# Patient Record
Sex: Male | Born: 1945 | Race: White | Hispanic: No | Marital: Married | Smoking: Former smoker
Health system: Southern US, Community
[De-identification: ages and names within clinical notes are randomized; demographics above are authoritative.]

## PROBLEM LIST (undated history)

## (undated) DIAGNOSIS — F32A Depression, unspecified: Secondary | ICD-10-CM

## (undated) DIAGNOSIS — I10 Essential (primary) hypertension: Secondary | ICD-10-CM

## (undated) DIAGNOSIS — Z5111 Encounter for antineoplastic chemotherapy: Secondary | ICD-10-CM

## (undated) DIAGNOSIS — R51 Headache: Secondary | ICD-10-CM

## (undated) DIAGNOSIS — F329 Major depressive disorder, single episode, unspecified: Secondary | ICD-10-CM

## (undated) DIAGNOSIS — C642 Malignant neoplasm of left kidney, except renal pelvis: Secondary | ICD-10-CM

## (undated) DIAGNOSIS — I2510 Atherosclerotic heart disease of native coronary artery without angina pectoris: Secondary | ICD-10-CM

## (undated) DIAGNOSIS — Z923 Personal history of irradiation: Secondary | ICD-10-CM

## (undated) DIAGNOSIS — E213 Hyperparathyroidism, unspecified: Secondary | ICD-10-CM

## (undated) DIAGNOSIS — I714 Abdominal aortic aneurysm, without rupture, unspecified: Secondary | ICD-10-CM

## (undated) DIAGNOSIS — L309 Dermatitis, unspecified: Secondary | ICD-10-CM

## (undated) DIAGNOSIS — E119 Type 2 diabetes mellitus without complications: Secondary | ICD-10-CM

## (undated) DIAGNOSIS — J45909 Unspecified asthma, uncomplicated: Secondary | ICD-10-CM

## (undated) DIAGNOSIS — E860 Dehydration: Secondary | ICD-10-CM

## (undated) DIAGNOSIS — G4730 Sleep apnea, unspecified: Secondary | ICD-10-CM

## (undated) DIAGNOSIS — K921 Melena: Principal | ICD-10-CM

## (undated) DIAGNOSIS — E785 Hyperlipidemia, unspecified: Secondary | ICD-10-CM

## (undated) DIAGNOSIS — IMO0001 Reserved for inherently not codable concepts without codable children: Secondary | ICD-10-CM

## (undated) DIAGNOSIS — F419 Anxiety disorder, unspecified: Secondary | ICD-10-CM

## (undated) DIAGNOSIS — J449 Chronic obstructive pulmonary disease, unspecified: Secondary | ICD-10-CM

## (undated) DIAGNOSIS — R519 Headache, unspecified: Secondary | ICD-10-CM

## (undated) DIAGNOSIS — E039 Hypothyroidism, unspecified: Secondary | ICD-10-CM

## (undated) DIAGNOSIS — N189 Chronic kidney disease, unspecified: Secondary | ICD-10-CM

## (undated) DIAGNOSIS — K219 Gastro-esophageal reflux disease without esophagitis: Secondary | ICD-10-CM

## (undated) DIAGNOSIS — G629 Polyneuropathy, unspecified: Secondary | ICD-10-CM

## (undated) DIAGNOSIS — R011 Cardiac murmur, unspecified: Secondary | ICD-10-CM

## (undated) DIAGNOSIS — I712 Thoracic aortic aneurysm, without rupture: Secondary | ICD-10-CM

## (undated) DIAGNOSIS — C801 Malignant (primary) neoplasm, unspecified: Secondary | ICD-10-CM

## (undated) DIAGNOSIS — Z5112 Encounter for antineoplastic immunotherapy: Secondary | ICD-10-CM

## (undated) HISTORY — DX: Melena: K92.1

## (undated) HISTORY — DX: Anxiety disorder, unspecified: F41.9

## (undated) HISTORY — PX: CERVICAL FUSION: SHX112

## (undated) HISTORY — PX: KNEE SURGERY: SHX244

## (undated) HISTORY — PX: CARDIAC CATHETERIZATION: SHX172

## (undated) HISTORY — DX: Encounter for antineoplastic chemotherapy: Z51.11

## (undated) HISTORY — DX: Unspecified asthma, uncomplicated: J45.909

## (undated) HISTORY — PX: BACK SURGERY: SHX140

## (undated) HISTORY — DX: Malignant neoplasm of left kidney, except renal pelvis: C64.2

## (undated) HISTORY — DX: Dehydration: E86.0

## (undated) HISTORY — DX: Encounter for antineoplastic immunotherapy: Z51.12

## (undated) HISTORY — PX: FRACTURE SURGERY: SHX138

## (undated) HISTORY — DX: Essential (primary) hypertension: I10

## (undated) HISTORY — PX: CORONARY ARTERY BYPASS GRAFT: SHX141

## (undated) HISTORY — DX: Hyperlipidemia, unspecified: E78.5

## (undated) HISTORY — DX: Type 2 diabetes mellitus without complications: E11.9

---

## 1998-07-30 ENCOUNTER — Observation Stay (HOSPITAL_COMMUNITY): Admission: RE | Admit: 1998-07-30 | Discharge: 1998-07-31 | Payer: Self-pay | Admitting: Neurosurgery

## 2001-10-30 ENCOUNTER — Encounter: Payer: Self-pay | Admitting: Cardiology

## 2001-10-30 ENCOUNTER — Encounter: Admission: RE | Admit: 2001-10-30 | Discharge: 2001-10-30 | Payer: Self-pay | Admitting: Cardiology

## 2001-11-06 ENCOUNTER — Ambulatory Visit (HOSPITAL_COMMUNITY): Admission: RE | Admit: 2001-11-06 | Discharge: 2001-11-06 | Payer: Self-pay | Admitting: Cardiology

## 2001-11-13 ENCOUNTER — Encounter: Payer: Self-pay | Admitting: Cardiology

## 2001-11-13 ENCOUNTER — Ambulatory Visit (HOSPITAL_COMMUNITY): Admission: RE | Admit: 2001-11-13 | Discharge: 2001-11-13 | Payer: Self-pay | Admitting: Cardiology

## 2001-11-27 ENCOUNTER — Encounter: Payer: Self-pay | Admitting: Cardiothoracic Surgery

## 2001-11-29 ENCOUNTER — Inpatient Hospital Stay (HOSPITAL_COMMUNITY): Admission: RE | Admit: 2001-11-29 | Discharge: 2001-12-04 | Payer: Self-pay | Admitting: Cardiothoracic Surgery

## 2001-11-29 ENCOUNTER — Encounter: Payer: Self-pay | Admitting: Cardiothoracic Surgery

## 2001-11-30 ENCOUNTER — Encounter: Payer: Self-pay | Admitting: Cardiothoracic Surgery

## 2001-12-01 ENCOUNTER — Encounter: Payer: Self-pay | Admitting: Cardiothoracic Surgery

## 2001-12-02 ENCOUNTER — Encounter: Payer: Self-pay | Admitting: Cardiothoracic Surgery

## 2001-12-08 ENCOUNTER — Encounter: Admission: RE | Admit: 2001-12-08 | Discharge: 2001-12-08 | Payer: Self-pay | Admitting: Cardiothoracic Surgery

## 2001-12-08 ENCOUNTER — Encounter: Payer: Self-pay | Admitting: Cardiothoracic Surgery

## 2002-01-08 ENCOUNTER — Encounter (HOSPITAL_COMMUNITY): Admission: RE | Admit: 2002-01-08 | Discharge: 2002-04-08 | Payer: Self-pay | Admitting: Cardiology

## 2002-07-11 ENCOUNTER — Encounter: Payer: Self-pay | Admitting: Cardiology

## 2002-07-11 ENCOUNTER — Encounter: Admission: RE | Admit: 2002-07-11 | Discharge: 2002-07-11 | Payer: Self-pay | Admitting: Cardiology

## 2002-07-16 ENCOUNTER — Ambulatory Visit (HOSPITAL_COMMUNITY): Admission: RE | Admit: 2002-07-16 | Discharge: 2002-07-16 | Payer: Self-pay | Admitting: Cardiology

## 2002-09-26 ENCOUNTER — Ambulatory Visit (HOSPITAL_COMMUNITY): Admission: RE | Admit: 2002-09-26 | Discharge: 2002-09-26 | Payer: Self-pay | Admitting: *Deleted

## 2006-05-05 ENCOUNTER — Other Ambulatory Visit: Payer: Self-pay

## 2006-05-05 ENCOUNTER — Emergency Department: Payer: Self-pay | Admitting: Internal Medicine

## 2007-04-11 ENCOUNTER — Other Ambulatory Visit: Payer: Self-pay

## 2007-04-11 ENCOUNTER — Emergency Department: Payer: Self-pay | Admitting: Emergency Medicine

## 2008-02-07 ENCOUNTER — Encounter: Admission: RE | Admit: 2008-02-07 | Discharge: 2008-02-07 | Payer: Self-pay | Admitting: Cardiology

## 2008-02-15 ENCOUNTER — Ambulatory Visit (HOSPITAL_COMMUNITY): Admission: RE | Admit: 2008-02-15 | Discharge: 2008-02-15 | Payer: Self-pay | Admitting: Cardiology

## 2009-05-08 ENCOUNTER — Emergency Department (HOSPITAL_COMMUNITY): Admission: EM | Admit: 2009-05-08 | Discharge: 2009-05-09 | Payer: Self-pay | Admitting: Emergency Medicine

## 2009-09-08 ENCOUNTER — Ambulatory Visit: Payer: Self-pay

## 2009-12-17 HISTORY — PX: PARATHYROIDECTOMY: SHX19

## 2010-07-12 LAB — DIFFERENTIAL
Basophils Absolute: 0.1 10*3/uL (ref 0.0–0.1)
Eosinophils Absolute: 0.2 10*3/uL (ref 0.0–0.7)
Lymphocytes Relative: 10 % — ABNORMAL LOW (ref 12–46)
Lymphs Abs: 1.4 10*3/uL (ref 0.7–4.0)
Neutrophils Relative %: 83 % — ABNORMAL HIGH (ref 43–77)

## 2010-07-12 LAB — POCT I-STAT, CHEM 8
BUN: 19 mg/dL (ref 6–23)
Creatinine, Ser: 0.8 mg/dL (ref 0.4–1.5)
Hemoglobin: 13.3 g/dL (ref 13.0–17.0)
Potassium: 4.8 mEq/L (ref 3.5–5.1)
Sodium: 133 mEq/L — ABNORMAL LOW (ref 135–145)
TCO2: 29 mmol/L (ref 0–100)

## 2010-07-12 LAB — CBC
Platelets: 247 10*3/uL (ref 150–400)
RDW: 14.1 % (ref 11.5–15.5)
WBC: 14.1 10*3/uL — ABNORMAL HIGH (ref 4.0–10.5)

## 2010-07-12 LAB — URINALYSIS, ROUTINE W REFLEX MICROSCOPIC
Bilirubin Urine: NEGATIVE
Glucose, UA: >1000 mg/dL — AB
Hgb urine dipstick: NEGATIVE
Ketones, ur: NEGATIVE mg/dL
Leukocytes, UA: NEGATIVE
Nitrite: NEGATIVE
Protein, ur: NEGATIVE mg/dL
Specific Gravity, Urine: 1.024 (ref 1.005–1.030)
Urobilinogen, UA: 1 mg/dL (ref 0.0–1.0)
pH: 7 (ref 5.0–8.0)

## 2010-07-12 LAB — URINE MICROSCOPIC-ADD ON

## 2010-09-08 NOTE — Cardiovascular Report (Signed)
NAME:  [name], [name] NO.:  [id number]   MEDICAL RECORD NO.:  [id number]          PATIENT TYPE:  OIB   LOCATION:  2899                         FACILITY:  [facility]   PHYSICIAN:  Thereasa Solo. Little, M.D. DATE OF BIRTH:  02/09/1946   DATE OF PROCEDURE:  DATE OF DISCHARGE:  02/15/2008                            CARDIAC CATHETERIZATION   INDICATIONS FOR TEST:  This 65 year old male had bypass surgery in 2003.  He had presented complaining of exertional shortness of breath  associated with chest pressure.  A nuclear study was performed that  showed a 52% ejection fraction and inferior ischemia.  Because of that,  he was brought to the cath lab for cardiac catheterization.   PROCEDURE:  After obtaining informed consent, the patient was prepped  and draped in the usual sterile fashion exposing the right groin  following local anesthetic with 1% Xylocaine.  The Seldinger technique  was employed and a 5-French introducer sheath was placed in the right  femoral artery.  Left and right coronary arteriography and  ventriculography in the RAO projection and a distal aortogram and graft  visualization x3 was performed.   COMPLICATIONS:  None.   EQUIPMENT:  5-French Judkins configuration catheters.   The right coronary catheter was used to selectively engage all grafts.   TOTAL CONTRAST:  125 mL.   RESULTS:  1. Hemodynamic monitoring central aortic pressure was 159/83 and left      ventricular pressure was 167/10.  There was no significant gradient      at the time of pullback.  2. Ventriculography.  Ventriculography in the RAO projection using 20      mL of contrasted 12 mL per second showed an ejection fraction of      50%.  The posterior basilar segment was akinetic.  The left      ventricular end-diastolic pressure was 22.   Distal aortogram:  Distal aortogram done at the level of the renal  artery showed no evidence of renal artery stenosis and no abdominal  aortic  aneurysm.   Coronary arteriography:  On fluoroscopy there was calcification of the  left main and LAD.   1. Left main appeared to be normal.  2. Circumflex.  The ongoing circumflex was small after the first OM      came off.  The first OM was visualized and was free of disease.  3. LAD.  The proximal portion of the LAD had 80% narrowing just distal      to the left main.  The LAD itself showed bidirectional flow in its      proximal portion as did the first diagonal.  4. Right coronary artery right coronary was 100% occluded by cath in      2004.  Grafts:  Internal mammary artery to the LAD.  Internal mammary artery  was a large vessel inserted into the midportion of the LAD.  There was  excellent visualization of the LAD, which was free of disease.  There  was a very large collateral bed that seemed to attached at the end of  the LAD  and connected with the end of the PDA.  There was good  visualization of the PDA and the posterior lateral vessels and even the  distal RCA.  This entire system was well visualized, had no severe  stenoses.  1. Saphenous vein graft to the PDA, 100% occluded as ostium.  2. Saphenous vein graft to the diagonal.  The graft was widely patent.      The diagonal was small and free of disease and there was actually a      mismatch between the size of the graft and the size of the      diagonals.   With compare to his previous cath, there has been no substantial change.  His graft patency remains good.  He has an excellent collateral bed to  the distal right coronary artery and I cannot see any significant  anatomy that would correlate with the positive nuclear study or his  symptoms of exertional pressure.           ______________________________  Thereasa Solo. Little, M.D.     ABL/MEDQ  D:  02/15/2008  T:  02/16/2008  Job:  [id number]   cc:   Dr Franne Forts  Cath Lab

## 2010-09-11 NOTE — Cardiovascular Report (Signed)
[facility]  Patient:    [name], [name] Visit Number: [id number] MRN: [id number]          Service Type: CAT Location: [facility] 2852 [id number] Attending Physician:  Loreli Dollar Dictated by:   Julieanne Manson, M.D. Proc. Date: 11/06/01 Admit Date:  11/06/2001 Discharge Date: 11/06/2001   CC:         Franne Forts, M.D.  Cardiac Catheterization Laboratory   Cardiac Catheterization  INDICATIONS FOR PROCEDURE: The patient is a 65 year old male who has had classic anginal symptoms of about two weeks duration. He is brought in for outpatient cardiac catheterization and he has a normal ECG.  DESCRIPTION OF PROCEDURE: The patient was prepped and draped in the usual sterile fashion exposing the right groin.  Following local anesthetic with 1% Xylocaine, the Seldinger technique was employed and a 5 [place] introducer sheath was placed in the right femoral artery.  Selective left and right coronary arteriography and ventriculography in the RAO and LAO projection was performed.  COMPLICATIONS: None.  EQUIPMENT: A 5 French Judkins configuration catheters but a 5 cm left catheter had to be used to engage the left main.  RESULTS: 1. Hemodynamic monitoring: Central aortic pressure 145/81, left    ventricular pressure 146/13 with no significant valve gradient noted    at the time of pullback. 2. Ventriculography: Ventriculography in the both the RAO and LAO    projection revealed posterior basilar segment to be akinetic. The    inferior and lateral wall in particular showed normal contractility,    ejection fraction approximately 50%, end-diastolic pressure 15.  CORONARY ARTERIOGRAPHY: There was calcification seen on fluoroscopy in the distribution of the proximal LAD and proximal RCA. There was also minimal calcification in the coronary ostium. 1. Left main: Minimal irregularities. 2. LAD: The LAD had proximal and mid irregularities of 40-50% with the  distal vessel being free of disease and the first diagonal having only    minimal irregularities. There was a very large left to right collateral    bed. 3. Circumflex: The circumflex was a small vessel in the AV groove but    gave rise to one relatively good size OM. There was minimal irregularities    in the OM. 4. Right coronary artery: The right coronary artery was the larger of all    vessels. It was totally occluded in its midportion. There was large    left to right collaterals filling the PDA and two large posterolateral    branches.  CONCLUSIONS: 1. Total occlusion of the right coronary artery with large left to    right collaterals and mild to moderate disease in the left anterior    descending system. 2. Slightly diminished ejection fraction at 50%.  At this point, the patient needs an nuclear study. If there is minimal ischemia, particularly in the inferior wall, I would recommend medical therapy only. If however, there is moderate ischemia in the right coronary artery, percutaneous intervention to the totally occluded right would be an option. Clearly, if there is ischemia in the anterior wall, then this patient would need revascularization with bypass surgery. Dictated by:   Julieanne Manson, M.D. Attending Physician:  Loreli Dollar DD:  11/06/01 TD:  11/07/01 Job: [id number]

## 2010-09-11 NOTE — Discharge Summary (Signed)
NAME:  [name], [name] NO.:  [id number]   MEDICAL RECORD NO.:  [id number]                   PATIENT TYPE:  INP   LOCATION:  2032                                 FACILITY:  [facility]   PHYSICIAN:  Kerin Perna, M.D.               DATE OF BIRTH:  20-May-1945   DATE OF ADMISSION:  11/29/2001  DATE OF DISCHARGE:  12/04/2001                                 DISCHARGE SUMMARY   ADMISSION DIAGNOSES:  Coronary artery disease.   SECONDARY DIAGNOSES:  1. Type 2 diabetes mellitus.  2. Postoperative anemia secondary to blood loss.  3. Hypertension.   DISCHARGE DIAGNOSES:  Coronary artery disease.   HOSPITAL COURSE:  The patient was admitted to [facility] on November 29, 2001, after being seen and evaluated by Dr. Donata Clay as an outpatient  for coronary artery disease.  On the day of admission the patient underwent  a coronary artery bypass graft x4 with the left internal mammary artery  anastomosed to the left anterior descending artery with saphenous vein graft  to the diagonal artery and a sequential saphenous vein graft to the  posterior descending and posterolateral arteries.  No complications were  noted during the procedure.  Postoperatively the patient had a relatively  uneventful hospital course.  He had postoperative anemia secondary to blood  loss but this was tolerated well.  He was started on empiric Tequin for  upper respiratory congestion.  His diabetes mellitus was adequately  controlled with Lantus insulin and Glucophage.  He was subsequently deemed  stable for discharge home on postoperative day #4, December 03, 2001.   DISCHARGE MEDICATIONS:  1. Ultram 50 mg 1-2 tablets every 4 to 6 hours as needed for pain.  2. Aspirin 325 mg one daily.  3. Guaifenesin 600 mg two tablets twice daily for seven days.  4. Tequin 400 mg one daily for seven days.  5. Lopressor 50 mg one-half tablet every 12 hours.  6. Glucophage 1000 mg one tablet twice  daily.   DISCHARGE ACTIVITIES:  The patient was told no driving, strenuous activity  or lifting heavy objects.  He was told to walk daily and continue breathing  exercises.   DISCHARGE DIET:  1800 calorie, ADA diet.   WOUND CARE:  The patient was told he could shower and clean his incisions  with soap and water.   DISPOSITION:  To home.    FOLLOWUP APPOINTMENTS:  The patient was told to call his cardiologist, Dr.  Clarene Duke, for a two-week followup appointment.  He was told to see Dr. Donata Clay in three weeks.  The office will call and verify the time and date of  his appointment.  He was told to go to the [facility] one  hour before this appointment for chest x-ray.     Levin Erp. Steward, P.A.  Kerin Perna, M.D.    BGS/MEDQ  D:  12/02/2001  T:  12/06/2001  Job:  [id number]   cc:   Thereasa Solo. Little, M.D.

## 2010-09-11 NOTE — Cardiovascular Report (Signed)
NAME:  [name], [name] NO.:  [id number]   MEDICAL RECORD NO.:  [id number]                   PATIENT TYPE:  OIB   LOCATION:  2859                                 FACILITY:  [facility]   PHYSICIAN:  Thereasa Solo. Little, M.D.              DATE OF BIRTH:  [birth year]   DATE OF PROCEDURE:  07/16/2002  DATE OF DISCHARGE:                              CARDIAC CATHETERIZATION   INDICATIONS FOR PROCEDURE:  The patient is a 65 year old male who had bypass  surgery in August 2003.  He presented to my office on Thursday with 2 days  of exertional-type chest pain that was somewhat atypical but comparable to  what he had prior to his bypass surgery.  Because of his diabetes, his  Glucophage was discontinued.  He was brought in for outpatient cardiac  catheterization.  Over the weekend after stopping the Glucophage, he walked  3.5 miles without any symptoms.   DESCRIPTION OF PROCEDURE:  The patient was prepped and draped in the usual  sterile fashion exposing the right groin.  Applying local anesthetic of 1%  Xylocaine, the Seldinger technique was employed, and a 5-French introducer  sheath was placed into the right femoral artery.  Left and right coronary  arteriography, graft visualization, ventriculography, and aortic root  injection were performed.   EQUIPMENT:  Judkins 5-French configuration catheters, and the internal  mammary artery was cannulated with a right coronary catheter.   COMPLICATIONS:  None.   TOTAL CONTRAST:  150 cc.   RESULTS:  1. Hemodynamic monitoring     A. Central aortic pressure was 132/78.     B. Left ventricular pressure was 132/2 with no aortic valve gradient        noted at the time of pullback.  2. Ventriculography:  Ventriculography in the RAO projection revealed normal     LV systolic function.  Ejection fraction was approximately 50%, and the     end diastolic pressure was 15.  3. 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  No  evidence of an aneurysm of     dissection plane was noted.  4. Coronary arteriography     A. Left main:  Normal.     B. LAD:  The LAD was 100% occluded proximally after a first diagonal and        septal perforator.  The distal LAD was well visualized via the        internal mammary graft.     C. Circumflex:  Nondominant vessel.     D. Right coronary artery:  100% occluded proximally.  There was a very        large left-to-right collateral bed off the LAD.  This supplied the        entire PDA, PL, and ongoing RCA.     E. Grafts  5. Saphenous vein graft to the diagonal:  The graft itself was  widely     patent.  The diagonal was extremely small, less than about 1 to 1.5 mm in     diameter, and less than 30 mm in length.  The proximal portion at the     distal anastomotic site was about 70% narrowed.  This vessel was too     small to safely intervene with PCI.  6. Saphenous vein graft to the RCA:  The graft was 100% occluded proximally.     There was no hangup of contrast to suggest acute thrombus.  7. Internal mammary artery to the LAD:  The IMA was widely patent.  The LAD     was well visualized and was free of significant narrowing with a very     large collateral bed.  As the LAD crossed the apex of the heart, it     filled the entire PDA and posterolateral.   CONCLUSION:  1. Loss of the saphenous vein graft to the right coronary artery; however,     the right coronary artery is well protected via collaterals from the left     anterior descending artery.  2. Distal anastomotic narrowing in the saphenous vein graft to a very small     insignificant diagonal that is felt to be too small for percutaneous     intervention.  3. Ejection fraction 50%.  4. Dilatation of the aortic root.   A 12-lead EKG was performed following the procedure which was normal.  I  cannot be certain that his complaints of chest pain were, in fact, angina  with a normal EKG and his ability to walk 3 miles 2  days prior to this  cardiac catheterization.  I do not feel that attempts at intervening on the  occluded graft to the right coronary artery was prudent.  He will be managed  with medical therapy alone and should do quite well.                                               Thereasa Solo. Little, M.D.    ABL/MEDQ  D:  07/16/2002  T:  07/16/2002  Job:  [id number]   cc:   Julieanne Manson  [address]  [place]  [place] [address]  Fax: [phone]   CVTS

## 2010-09-11 NOTE — Op Note (Signed)
NAME:  Patrick, Jones NO.:  [id number]   MEDICAL RECORD NO.:  [id number]                   PATIENT TYPE:  INP   LOCATION:  2032                                 FACILITY:  [facility]   PHYSICIAN:  [name], M.D.           DATE OF BIRTH:  12-Aug-1945   DATE OF PROCEDURE:  DATE OF DISCHARGE:                                 OPERATIVE REPORT   OPERATION:  Coronary artery bypass grafting times four (left internal  mammary artery to LAD; saphenous vein graft to diagonal; sequential  saphenous vein graft to posterior descending and posterolateral branch of  the right coronary).   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   SURGEON:  [name], M.D.   ASSISTANT:  1. [name], M.D.  2. [name], P.A.   ANESTHESIA:  General by Dr. Darlyn Jones.   INDICATIONS FOR PROCEDURE:  The patient is a 65 year old type II diabetic  smoker, who presents with recurrent chest pain. Cardiac cath by Dr. Caprice Jones demonstrated occlusion of the right coronary with subtotal stenosis  of the LAD diagonal. He was treated medically without relief of symptoms and  he was felt to be a candidate for surgical coronary revascularization. Prior  to operation, I examined the patient in the office and reviewed the results  of the cardiac cath with the patient and his wife. I discussed the  indications and expected benefits of coronary bypass surgery for treatment  of his coronary artery disease and the expected outcome of those alternative  therapies. I reviewed the major aspects of the proposed operation with the  patient including the location of the surgical incisions, the use of general  anesthesia and cardiopulmonary artery bypass, the expected postop hospital  recovery, and the use of postop  support. I discussed with the patient, the  risks to him of coronary bypass surgery including the risks of MI, CVA,  bleeding, infection, blood transfusion requirement and death. He understood  with his heavy smoking history, he would be at risk for pulmonary  complications that could include difficulty with airway secretions,  prolonged mechanical ventilator requirements, pneumonia and an extended  hospital stay. After discussing these aspects of the procedure and the  benefits and risks, he agreed to proceed with the operation as planned under  what I felt was an informed consent.   OPERATIVE FINDINGS:  Saphenous vein was harvested from the right thigh using  endoscopic vein technique and extended to the lower leg with the open  technique. The vein was of average quality. The mammary artery was a 1.5 to  2 mm vessel with a good flow. The inferior wall had some moderate old scar.  There was some adhesions of the pericardium. The lungs were  inflamed and had  changes of chronic smoking and COPD.   PROCEDURE:  The patient was brought to the OR and placed supine on the  operating room table where general anesthesia was induced under invasive  hemodynamic monitoring. The chest, abdomen, and legs were prepped with  Betadine and draped in a sterile field. A sternal incision was made and the  saphenous vein was harvested from the right leg. The internal mammary artery  was harvested and the pedicle graft from it's origin to the subclavian  vessels. It was a good vessel with a good flow. Heparin was administered and  the ACT was documented as being therapeutic. Sternal retractor was placed.  The pericardium was opened. There were purse strings placed in the right  atrium and ascending aorta. The patient was cannulated and placed on bypass  and cooled to 32 degrees. The coronaries were identified for grafting and  the mammary artery and vein grafts were prepared for the distal anastomosis.  A  cardioplegic cannula was placed and the patient was cooled to 30 degrees.  As the aorta cross clamp was applied, 850 cc of cold blood Cardioplegia was  delivered to the aortic root with immediate cardioplegic arrest and  subsequent dropping of less than 14 degrees. A topical iced saline slush was  used to augment myocardial preservation and a pericardial inflator pad used  to protect the left phrenic nerve.   The distal coronary anastomosis was then performed. The first distal  anastomosis with the sequential vein graft to the posterior descending and  posterolateral. The posterior descending was 1.4 mm vessel, sclerotic, with  a proximal total occlusion. A side-to-side anastomosis of the vein was sewn  using running 7-0 Prolene with good flow to the graft. The second distal  anastomosis was the continuation of the sequential vein graft to the  posterolateral. This was a 1.5 mm vessel with proximal total occlusion. The  end of the vein was sewn down the side with running 7-0 Prolene with good  flow to the graft. Cardioplegia was re dosed. A third vessel anastomosis was  sewn to the diagonal branch of the LAD. This had a proximal 50-60% stenosis  with calcium. A saphenous vein was sewn down the side with a running 7-0  Prolene with good flow to the graft. The fourth distal anastomosis was to  the distal third of the LAD. This was a 1.5 mm vessel and had proximal 70-  80% stenosis. The left internal mammary artery pedicle was brought through  an opening created and the left lateral pericardium was brought down onto  the LAD and sewn end-to-side with running 8-0 Prolene. There was good flow  through the anastomosis with immediate rise in temperature after release of  the pedicle clamp on the mammary artery. The mammary pedicle was secured to  the epicardium and the aorta cross clamp was removed.   The heart remained in basic spontaneous rhythm. Using a partial occlusion clamp, two proximal  vein anastomoses were placed on the ascending artery  using a 4-0 mm puncher and running 6-0 Prolene. The partial clamp was  removed and the vein grafts were perfused. He had excellent flow and  hemostasis was documented at the proximal distal sites. The patient was  rewarmed and reperfused. Temporary pace monitors were applied. The lungs  were expanded and ventilator was resumed. When the patient reached 37  degrees, he was weaned from bypass without difficulty without inotropes.  Blood pressure and cardiac output were  stable. Protamine was administered  without adverse reactions. The cannulas were removed. The mediastinum was  irrigated with warm antibiotic irrigation. The leg incision was irrigated  and closed in a standard fashion. The pericardium was loosely  reapproximated. Two mediastinal and a left pleural chest tube were placed  and brought out through separate incisions. The sternum was reapproximated  with interrupted steel wire. The fascia was closed with interrupted #1  Vicryl. The subcutaneous and skin were closed with running Vicryl. Sterile  dressings were applied. Total bypass time was 150 minutes with a cross clamp  time of 80 minutes.                                                Mikey Bussing, M.D.    PV/MEDQ  D:  11/29/2001  T:  12/03/2001  Job:  [id number]   cc:   Thereasa Solo. Little, M.D.

## 2011-01-25 LAB — GLUCOSE, CAPILLARY
Glucose-Capillary: 117 — ABNORMAL HIGH
Glucose-Capillary: 147 — ABNORMAL HIGH

## 2011-12-31 ENCOUNTER — Ambulatory Visit: Payer: Self-pay | Admitting: Urology

## 2012-01-19 ENCOUNTER — Ambulatory Visit: Payer: Self-pay | Admitting: Urology

## 2012-01-19 LAB — CBC WITH DIFFERENTIAL/PLATELET
Basophil %: 0.6 %
Eosinophil %: 5.1 %
HCT: 36.9 % — ABNORMAL LOW (ref 40.0–52.0)
Lymphocyte %: 15.4 %
MCH: 29 pg (ref 26.0–34.0)
MCV: 88 fL (ref 80–100)
Neutrophil %: 72.1 %
RBC: 4.17 10*6/uL — ABNORMAL LOW (ref 4.40–5.90)
RDW: 13.6 % (ref 11.5–14.5)
WBC: 10.9 10*3/uL — ABNORMAL HIGH (ref 3.8–10.6)

## 2012-01-19 LAB — BASIC METABOLIC PANEL
Chloride: 103 mmol/L (ref 98–107)
Co2: 28 mmol/L (ref 21–32)
Creatinine: 0.84 mg/dL (ref 0.60–1.30)
EGFR (African American): >60
Sodium: 140 mmol/L (ref 136–145)

## 2012-01-25 ENCOUNTER — Ambulatory Visit: Payer: Self-pay | Admitting: Urology

## 2013-02-09 DIAGNOSIS — C642 Malignant neoplasm of left kidney, except renal pelvis: Secondary | ICD-10-CM | POA: Insufficient documentation

## 2013-02-09 HISTORY — DX: Malignant neoplasm of left kidney, except renal pelvis: C64.2

## 2013-05-03 ENCOUNTER — Ambulatory Visit: Payer: Self-pay | Admitting: Gastroenterology

## 2013-09-10 DIAGNOSIS — Z0289 Encounter for other administrative examinations: Secondary | ICD-10-CM

## 2013-11-21 LAB — BASIC METABOLIC PANEL
BUN: 17 mg/dL (ref 4–21)
Creatinine: 1.1 mg/dL (ref <=1.3)
Glucose: 159 mg/dL
POTASSIUM: 4.9 mmol/L (ref 3.4–5.3)
Sodium: 138 mmol/L (ref 137–147)

## 2013-11-21 LAB — LIPID PANEL
Cholesterol: 135 mg/dL (ref 0–200)
HDL: 38 mg/dL (ref 35–70)
LDL CALC: 64 mg/dL
LDl/HDL Ratio: 1.7
Triglycerides: 165 mg/dL — AB (ref 40–160)

## 2013-11-21 LAB — CBC AND DIFFERENTIAL
HEMATOCRIT: 37 % — AB (ref 41–53)
Hemoglobin: 12.1 g/dL — AB (ref 13.5–17.5)
NEUTROS ABS: 64 /uL
PLATELETS: 237 10*3/uL (ref 150–399)
WBC: 7.8 10*3/mL

## 2013-11-21 LAB — PSA: PSA: 2

## 2013-11-21 LAB — HEPATIC FUNCTION PANEL
ALT: 11 U/L (ref 10–40)
AST: 14 U/L (ref 14–40)
Alkaline Phosphatase: 82 U/L (ref 25–125)
BILIRUBIN, TOTAL: 0.5 mg/dL

## 2013-11-21 LAB — TSH: TSH: 1.26 u[IU]/mL (ref <=5.90)

## 2013-12-12 ENCOUNTER — Ambulatory Visit: Payer: Self-pay | Admitting: Family Medicine

## 2014-08-01 LAB — HEMOGLOBIN A1C: Hgb A1c MFr Bld: 8.4 % — AB (ref 4.0–6.0)

## 2014-08-13 NOTE — Op Note (Signed)
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   POSTOPERATIVE DIAGNOSIS: Left large left renal mass.   PROCEDURE: Left ureteroscopy with pelvic washings, stent placement.   SURGEON: Edrick Oh, MD   ANESTHESIA: General endotracheal anesthesia.   INDICATIONS: The patient is a 69 year old gentleman who presented for evaluation of painless gross hematuria on CT scan. He was noted to have a large infiltrating mass within the left kidney that was not entirely consistent with renal cell carcinoma. The suspicion initially was possible invasive transitional cell carcinoma. Cystoscopy of the bladder demonstrated no significant findings. He presents for ureteroscopy for further evaluation and differentiation of possible tumor type with possible biopsy.   DESCRIPTION OF PROCEDURE: After informed consent was obtained, the patient was taken to the Operating Room and placed in the dorsal lithotomy position under general endotracheal anesthesia. The patient was then prepped and draped in the usual standard fashion. The 22-French rigid cystoscope was introduced into the urethra under direct vision with no urethral abnormalities noted. Upon entering the prostatic fossa, moderate bilobar hypertrophy was noted with visual obstruction. Upon entering the bladder, the mucosa was inspected in its entirety with no definitive gross mucosal lesions noted. There was an area of slight erythema just behind the left ureteral orifice with no significant elevation or change in the mucosa to suggest tumor. A guidewire was advanced into the left ureteral orifice. It was advanced into the left upper pole collecting system without difficulty. The cystoscope was removed. The 6-French rigid ureteroscope was advanced into the urinary bladder. A second guidewire was utilized to help navigate the  scope into the left ureteral orifice. It was advanced through the ureter to the level of the crossing vessels. Due to angulation, the scope was unable to be advanced beyond this level. The decision was made to place an access sheath. A second guidewire was placed through the ureteroscope. The ureteroscope was removed. The access sheath was then placed under fluoroscopic guidance to just above the level of the crossing vessels. A flexible ureteroscope was then inserted through the sheath. It was advanced into the renal pelvic region. No abnormalities were noted through the ureter. Upon inspection just inside of the renal pelvis, prominent large vessels were noted with a distortion of the medial upper portion of the renal pelvis. The mucosa itself appeared to be intact with large dilated hypervascular vessels extending over at least half of the renal pelvis along the area of bulge. No definitive tumor could be visualized within the collecting system. There was no evidence of any papillary-appearing lesions. The scope was able to be advanced into upper pole calyces around the mass effect. No abnormalities were noted within these calyces. The more lateral and inferior calyces were then inspected with no abnormalities noted. An attempt was made at biopsy of the area at the edge of the hypervascularity. Due to angulation, however, this could not be performed through the flexible scope. Washings were obtained of the renal pelvis. These will be sent for analysis. Due to the prominent hypervascularity, the decision was made not to attempt any further biopsy of the lesion through the collecting system. With the degree of hypervascularity, a percutaneous approach could be considered, although there would be fairly significant risk for bleeding if the remainder of the tumor demonstrates some more hypervascularity. The ureter was reinspected upon withdrawal of the scope. No additional abnormalities  were appreciated. Due to the  dilation needed for sheath placement, the decision was made to place a stent. The cystoscope was backloaded over the initially placed guidewire. A 6-French x 26 cm double-J ureteral stent was advanced over the guidewire into the upper pole collecting system. Adequate curl was noted within the upper kidney. Adequate curl was also noted within the bladder. The bladder was drained. The cystoscope was removed. The patient was returned to the supine position and awakened from general endotracheal anesthesia. He was taken to the recovery room in stable condition. There were no problems or complications. The patient tolerated the procedure well. ____________________________ Denice Bors. Jacqlyn Larsen, MD bsc:cbb D: 01/25/2012 14:01:01 ET T: 01/25/2012 16:06:48 ET JOB#: [id number]  cc: Denice Bors. Jacqlyn Larsen, MD, <Dictator> Denice Bors Keiasha Diep MD ELECTRONICALLY SIGNED 01/25/2012 16:35

## 2014-08-19 ENCOUNTER — Ambulatory Visit
Admit: 2014-08-19 | Disposition: A | Discharge status: Home / Self Care | Payer: Self-pay | Attending: Family Medicine | Admitting: Family Medicine

## 2014-09-21 DIAGNOSIS — C649 Malignant neoplasm of unspecified kidney, except renal pelvis: Secondary | ICD-10-CM | POA: Insufficient documentation

## 2014-09-21 DIAGNOSIS — F32A Depression, unspecified: Secondary | ICD-10-CM | POA: Insufficient documentation

## 2014-09-21 DIAGNOSIS — E785 Hyperlipidemia, unspecified: Secondary | ICD-10-CM | POA: Insufficient documentation

## 2014-09-21 DIAGNOSIS — E119 Type 2 diabetes mellitus without complications: Secondary | ICD-10-CM | POA: Insufficient documentation

## 2014-09-21 DIAGNOSIS — E039 Hypothyroidism, unspecified: Secondary | ICD-10-CM | POA: Insufficient documentation

## 2014-09-21 DIAGNOSIS — I10 Essential (primary) hypertension: Secondary | ICD-10-CM | POA: Insufficient documentation

## 2014-09-21 DIAGNOSIS — J449 Chronic obstructive pulmonary disease, unspecified: Secondary | ICD-10-CM | POA: Insufficient documentation

## 2014-09-21 DIAGNOSIS — I2510 Atherosclerotic heart disease of native coronary artery without angina pectoris: Secondary | ICD-10-CM | POA: Insufficient documentation

## 2014-09-21 DIAGNOSIS — F329 Major depressive disorder, single episode, unspecified: Secondary | ICD-10-CM | POA: Insufficient documentation

## 2014-09-21 DIAGNOSIS — R011 Cardiac murmur, unspecified: Secondary | ICD-10-CM | POA: Insufficient documentation

## 2014-09-21 DIAGNOSIS — E669 Obesity, unspecified: Secondary | ICD-10-CM | POA: Insufficient documentation

## 2014-10-21 ENCOUNTER — Other Ambulatory Visit: Payer: Self-pay | Admitting: Family Medicine

## 2014-12-09 ENCOUNTER — Encounter: Payer: Self-pay | Admitting: Family Medicine

## 2014-12-09 ENCOUNTER — Ambulatory Visit (INDEPENDENT_AMBULATORY_CARE_PROVIDER_SITE_OTHER): Payer: Medicare Other | Admitting: Family Medicine

## 2014-12-09 VITALS — BP 116/64 | HR 72 | Temp 97.6°F | Resp 16 | Ht 71.0 in | Wt 286.0 lb

## 2014-12-09 DIAGNOSIS — L304 Erythema intertrigo: Secondary | ICD-10-CM

## 2014-12-09 DIAGNOSIS — E119 Type 2 diabetes mellitus without complications: Secondary | ICD-10-CM

## 2014-12-09 DIAGNOSIS — E669 Obesity, unspecified: Secondary | ICD-10-CM | POA: Diagnosis not present

## 2014-12-09 DIAGNOSIS — K529 Noninfective gastroenteritis and colitis, unspecified: Secondary | ICD-10-CM | POA: Insufficient documentation

## 2014-12-09 DIAGNOSIS — I2510 Atherosclerotic heart disease of native coronary artery without angina pectoris: Secondary | ICD-10-CM | POA: Insufficient documentation

## 2014-12-09 DIAGNOSIS — I10 Essential (primary) hypertension: Secondary | ICD-10-CM

## 2014-12-09 DIAGNOSIS — K219 Gastro-esophageal reflux disease without esophagitis: Secondary | ICD-10-CM | POA: Insufficient documentation

## 2014-12-09 DIAGNOSIS — E213 Hyperparathyroidism, unspecified: Secondary | ICD-10-CM | POA: Insufficient documentation

## 2014-12-09 DIAGNOSIS — G4733 Obstructive sleep apnea (adult) (pediatric): Secondary | ICD-10-CM | POA: Insufficient documentation

## 2014-12-09 DIAGNOSIS — J449 Chronic obstructive pulmonary disease, unspecified: Secondary | ICD-10-CM | POA: Insufficient documentation

## 2014-12-09 LAB — POCT GLYCOSYLATED HEMOGLOBIN (HGB A1C): Hemoglobin A1C: 7.5

## 2014-12-09 MED ORDER — CLOTRIMAZOLE-BETAMETHASONE 1-0.05 % EX CREA: 1.0000 "application " | TOPICAL_CREAM | Freq: Two times a day (BID) | CUTANEOUS | Status: DC

## 2014-12-09 NOTE — Patient Instructions (Signed)
Follow in 4 months

## 2014-12-09 NOTE — Progress Notes (Signed)
Patient ID: [name], male   DOB: Apr 13, 1946, 69 y.o.   MRN: [id number]       Patient: [name] Male    DOB: 07/21/1945   69 y.o.   MRN: [id number] Visit Date: 12/09/2014  Today's Provider: [name], MD   Chief Complaint  Patient presents with  . Diabetes  . Hypertension   Subjective:    HPI  Diabetes Mellitus Type II, Follow-up:   Lab Results  Component Value Date   HGBA1C 7.5 12/09/2014   HGBA1C 8.4* 08/01/2014   Lab Results  Component Value Date   HGBA1C 7.5 12/09/2014     Last seen for diabetes 4 months ago.  Management changes included started Actos 45 mg and increased Metformin to 1000 mg BID. He reports excellent compliance with treatment. He is not having side effects. Current symptoms include none and have been unchanged. Home blood sugar records: fasting range: 190-216  Episodes of hypoglycemia? no   Current Insulin Regimen: none Most Recent Eye Exam: 10/2014 Weight trend: decreasing steadily Prior visit with dietician: no Current diet: in general, a "healthy" diet   Current exercise: none  Pertinent Labs:    Component Value Date/Time   CHOL 135 11/21/2013   TRIG 165* 11/21/2013   CREATININE 1.1 11/21/2013   CREATININE 0.84 01/19/2012 0834   CREATININE 0.8 05/08/2009 2323    Wt Readings from Last 3 Encounters:  12/09/14 286 lb (129.729 kg)  08/21/14 291 lb (131.997 kg)     Hypertension, follow-up:  BP Readings from Last 3 Encounters:  12/09/14 116/64  08/21/14 142/80    He was last seen for hypertension 4 months ago.  BP at that visit was 142/80. Management changes since that visit include none. He reports excellent compliance with treatment. He is not having side effects. He is not exercising. He is not adherent to low salt diet.   Outside blood pressures, pt does not check. He is experiencing none.  Patient denies chest pain.   Cardiovascular risk factors include diabetes mellitus.  Use of agents associated  with hypertension: none.     Weight trend: decreasing steadily Wt Readings from Last 3 Encounters:  12/09/14 286 lb (129.729 kg)  08/21/14 291 lb (131.997 kg)    Current diet: in general, a "healthy" diet    ------------------------------------------------------------------------         Allergies  Allergen Reactions  . Ace Inhibitors Cough  . Amoxicillin Rash  . Codeine Rash  . Penicillins Rash   Previous Medications   ALBUTEROL (PROVENTIL HFA;VENTOLIN HFA) 108 (90 BASE) MCG/ACT INHALER    Inhale 1 puff into the lungs every 6 (six) hours as needed.    ALPRAZOLAM (XANAX) 0.5 MG TABLET    Take 0.5 mg by mouth daily.    ASPIRIN 81 MG TABLET    Take 81 mg by mouth daily.    CARVEDILOL (COREG) 12.5 MG TABLET    Take 12.5 mg by mouth 2 (two) times daily with a meal.    CHOLECALCIFEROL 1000 UNITS TABLET    Take 1,000 Units by mouth daily.    FLUOXETINE (PROZAC) 40 MG CAPSULE    Take 40 mg by mouth daily.    FLUTICASONE FUROATE-VILANTEROL 200-25 MCG/INH AEPB    Inhale 1 Inhaler into the lungs as needed.    FLUTICASONE-SALMETEROL (ADVAIR DISKUS) 100-50 MCG/DOSE AEPB    Inhale 1 puff into the lungs as needed.    LOSARTAN (COZAAR) 100 MG TABLET    TAKE ONE TABLET  BY MOUTH EVERY DAY   MECLIZINE (ANTIVERT) 25 MG TABLET    Take 25 mg by mouth as needed.    METFORMIN (GLUCOPHAGE) 1000 MG TABLET    Take 1,000 mg by mouth 2 (two) times daily with a meal.    OMEPRAZOLE (PRILOSEC) 20 MG CAPSULE    Take 20 mg by mouth daily.    PIOGLITAZONE (ACTOS) 45 MG TABLET    Take 45 mg by mouth daily.    SIMVASTATIN (ZOCOR) 40 MG TABLET    Take 40 mg by mouth daily at 6 PM.     Review of Systems  Constitutional: Negative.   Eyes: Negative.   Respiratory: Negative.   Cardiovascular: Positive for chest pain.  Endocrine: Negative.   Allergic/Immunologic: Negative.   Neurological: Negative.   Psychiatric/Behavioral: Negative.     Social History  Substance Use Topics  . Smoking status: Former  Smoker    Quit date: 12/24/2001  . Smokeless tobacco: Never Used  . Alcohol Use: Not on file   Objective:   BP 116/64 mmHg  Pulse 72  Temp(Src) 97.6 F (36.4 C) (Oral)  Resp 16  Ht '5\' 11"'$  (1.803 m)  Wt 286 lb (129.729 kg)  BMI 39.91 kg/m2  SpO2 97%  Physical Exam  Constitutional: He is oriented to person, place, and time. He appears well-developed and well-nourished.  Obese.  HENT:  Head: Normocephalic and atraumatic.  Right Ear: External ear normal.  Left Ear: External ear normal.  Nose: Nose normal.  Eyes: Conjunctivae are normal.  Neck: Neck supple.  Cardiovascular: Normal rate, regular rhythm and normal heart sounds.   Pulmonary/Chest: Effort normal and breath sounds normal.  Abdominal: Soft.  Neurological: He is alert and oriented to person, place, and time.  Skin: Skin is warm and dry.  Psychiatric: He has a normal mood and affect. His behavior is normal. Judgment and thought content normal.        Assessment & Plan:     1. Type 2 diabetes mellitus without complication  - POCT glycosylated hemoglobin (Hb A1C)--7.5% today.  2. Adiposity D and E discussed at length with goal of waist size of 36inch belt--pt presently 46.  3. Essential (primary) hypertension  4. Eczema intertrigo  - clotrimazole-betamethasone (LOTRISONE) cream; Apply 1 application topically 2 (two) times daily.  Dispense: 45 g; Refill: 3 5.CAD Risk factors treated.    I have done the exam and reviewed the above chart and it is accurate to the best of my knowledge.   Richard Cranford Mon, MD  [facility]

## 2015-02-03 ENCOUNTER — Other Ambulatory Visit: Payer: Self-pay | Admitting: Family Medicine

## 2015-02-13 ENCOUNTER — Encounter: Payer: Self-pay | Admitting: Family Medicine

## 2015-02-13 ENCOUNTER — Ambulatory Visit (INDEPENDENT_AMBULATORY_CARE_PROVIDER_SITE_OTHER): Payer: Medicare Other | Admitting: Family Medicine

## 2015-02-13 VITALS — BP 114/60 | HR 72 | Temp 97.4°F | Resp 16 | Ht 71.5 in | Wt 282.0 lb

## 2015-02-13 DIAGNOSIS — E119 Type 2 diabetes mellitus without complications: Secondary | ICD-10-CM | POA: Insufficient documentation

## 2015-02-13 DIAGNOSIS — Z85528 Personal history of other malignant neoplasm of kidney: Secondary | ICD-10-CM | POA: Insufficient documentation

## 2015-02-13 DIAGNOSIS — J410 Simple chronic bronchitis: Secondary | ICD-10-CM | POA: Diagnosis not present

## 2015-02-13 DIAGNOSIS — R062 Wheezing: Secondary | ICD-10-CM | POA: Diagnosis not present

## 2015-02-13 DIAGNOSIS — E213 Hyperparathyroidism, unspecified: Secondary | ICD-10-CM | POA: Insufficient documentation

## 2015-02-13 DIAGNOSIS — D229 Melanocytic nevi, unspecified: Secondary | ICD-10-CM

## 2015-02-13 DIAGNOSIS — R5383 Other fatigue: Secondary | ICD-10-CM

## 2015-02-13 DIAGNOSIS — J449 Chronic obstructive pulmonary disease, unspecified: Secondary | ICD-10-CM | POA: Insufficient documentation
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
## 2015-03-05 ENCOUNTER — Other Ambulatory Visit: Payer: Self-pay | Admitting: Family Medicine

## 2015-03-05 NOTE — Telephone Encounter (Signed)
Ok to refill 

## 2015-03-06 ENCOUNTER — Ambulatory Visit: Payer: Medicare Other

## 2015-03-13 ENCOUNTER — Ambulatory Visit (INDEPENDENT_AMBULATORY_CARE_PROVIDER_SITE_OTHER): Payer: Medicare Other

## 2015-03-13 DIAGNOSIS — Z23 Encounter for immunization: Secondary | ICD-10-CM | POA: Diagnosis not present

## 2015-03-25 ENCOUNTER — Other Ambulatory Visit: Payer: Self-pay | Admitting: Family Medicine

## 2015-04-10 ENCOUNTER — Encounter: Payer: Medicare Other | Admitting: Family Medicine

## 2015-04-27 DIAGNOSIS — I712 Thoracic aortic aneurysm, without rupture, unspecified: Secondary | ICD-10-CM

## 2015-04-27 HISTORY — DX: Thoracic aortic aneurysm, without rupture, unspecified: I71.20

## 2015-04-27 HISTORY — DX: Thoracic aortic aneurysm, without rupture: I71.2

## 2015-05-13 ENCOUNTER — Ambulatory Visit (INDEPENDENT_AMBULATORY_CARE_PROVIDER_SITE_OTHER): Payer: Medicare Other | Admitting: Family Medicine

## 2015-05-13 VITALS — BP 132/70 | HR 68 | Temp 97.6°F | Resp 16 | Ht 70.0 in | Wt 284.0 lb

## 2015-05-13 DIAGNOSIS — Z0000 Encounter for general adult medical examination without abnormal findings: Secondary | ICD-10-CM | POA: Diagnosis not present

## 2015-05-13 NOTE — Progress Notes (Signed)
Patient ID: Patrick Moos., male   DOB: [birth year], 70 y.o.   MRN: [id number] Patient: Patrick Ahrendt., Male    DOB: 1946/02/03, 70 y.o.   MRN: [id number] Visit Date: 05/13/2015  Today's Provider: [name], MD   Chief Complaint  Patient presents with  . Annual Exam   Subjective:   [name]. is a 70 y.o. male who presents today for his Subsequent Annual Wellness Visit. He feels well. He reports exercising none. He reports he is sleeping well.  Review of Systems  Constitutional: Negative.   HENT: Negative.   Eyes: Negative.   Respiratory: Positive for apnea.   Cardiovascular: Negative.   Gastrointestinal: Negative.   Endocrine: Negative.   Genitourinary: Negative.   Musculoskeletal: Negative.   Skin: Negative.   Allergic/Immunologic: Negative.   Neurological: Negative.   Hematological: Negative.   Psychiatric/Behavioral: Negative.     Patient Active Problem List   Diagnosis Date Noted  . Chronic obstructive pulmonary disease ([place]) 02/13/2015  . Personal history of other malignant neoplasm of kidney 02/13/2015  . Hyperparathyroidism ([place]) 02/13/2015  . Type 2 diabetes mellitus ([place]) 02/13/2015  . Arteriosclerosis of coronary artery 12/09/2014  . CAFL (chronic airflow limitation) ([place]) 12/09/2014  . Enterogastritis 12/09/2014  . Acid reflux 12/09/2014  . HPTH (hyperparathyroidism) ([place]) 12/09/2014  . BP (high blood pressure) 12/09/2014  . Eczema intertrigo 12/09/2014  . Obstructive apnea 12/09/2014  . COPD, mild ([place]) 09/21/2014  . Atherosclerosis of coronary artery 09/21/2014  . Clinical depression 09/21/2014  . Diabetes mellitus, type 2 ([place]) 09/21/2014  . Essential (primary) hypertension 09/21/2014  . Cardiac murmur 09/21/2014  . HLD (hyperlipidemia) 09/21/2014  . Adult hypothyroidism 09/21/2014  . Adiposity 09/21/2014  . 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 Malignant neoplasm of kidney ([place]) 02/09/2013    Social  History   Social History  . Marital Status: Married    Spouse Name: N/A  . Number of Children: N/A  . Years of Education: N/A   Occupational History  . Not on file.   Social History Main Topics  . Smoking status: Former Smoker -- 1.50 packs/day for 35 years    Types: Cigarettes    Quit date: 12/24/2001  . Smokeless tobacco: Never Used  . Alcohol Use: No  . Drug Use: No  . Sexual Activity: Not on file   Other Topics Concern  . Not on file   Social History Narrative    Past Surgical History  Procedure Laterality Date  . Parathyroidectomy  12/17/09  . Coronary artery bypass graft      x 5  . Back surgery      compressed vertebrae-put in a plate to seprate the spaces  . Fracture surgery      left arm repair  . Knee surgery      left knee repair    His family history includes Cardiomyopathy in his mother; Congestive Heart Failure in his father; Diabetes in his brother; Heart attack in his brother; Heart disease in his brother and brother; Hypertension in his brother, father, and mother; Obesity in his brother; Sleep apnea in his son; Stroke in his mother.    Outpatient Prescriptions Prior to Visit  Medication Sig Dispense Refill  . albuterol (PROVENTIL HFA;VENTOLIN HFA) 108 (90 BASE) MCG/ACT inhaler Inhale 1 puff into the lungs every 6 (six) hours as needed.     . ALPRAZolam (XANAX) 0.5 MG tablet TAKE ONE TABLET BY MOUTH TWICE DAILY AS NEEDED 60 tablet  4  . aspirin 81 MG tablet Take 81 mg by mouth daily.     . carvedilol (COREG) 12.5 MG tablet Take 12.5 mg by mouth 2 (two) times daily with a meal.     . Cholecalciferol 1000 UNITS tablet Take 1,000 Units by mouth daily.     . clotrimazole-betamethasone (LOTRISONE) cream Apply 1 application topically 2 (two) times daily. 45 g 3  . FLUoxetine (PROZAC) 40 MG capsule TAKE ONE CAPSULE BY MOUTH EVERY DAY 30 capsule 12  . Fluticasone-Salmeterol (ADVAIR DISKUS) 100-50 MCG/DOSE AEPB Inhale 1 puff into the lungs as needed.     Marland Kitchen  losartan (COZAAR) 100 MG tablet TAKE ONE TABLET BY MOUTH EVERY DAY 30 tablet 12  . meclizine (ANTIVERT) 25 MG tablet Take 25 mg by mouth as needed.     . metFORMIN (GLUCOPHAGE) 1000 MG tablet Take 1,000 mg by mouth 2 (two) times daily with a meal.     . omeprazole (PRILOSEC) 20 MG capsule Take 20 mg by mouth daily.     . pioglitazone (ACTOS) 45 MG tablet TAKE ONE TABLET BY MOUTH EACH DAY. 30 tablet 12  . simvastatin (ZOCOR) 40 MG tablet Take 40 mg by mouth daily at 6 PM.      No facility-administered medications prior to visit.    Allergies  Allergen Reactions  . Ace Inhibitors Cough    Other reaction(s): OTHER  . Amoxicillin Rash    Other reaction(s): ANAPHYLAXIS  . Codeine Rash    Other reaction(s): ANAPHYLAXIS  . Penicillins Rash    Other reaction(s): ANAPHYLAXIS    Patient Care Team: [name]., MD as PCP - General (Family Medicine)  Objective:   Vitals:  Filed Vitals:   05/13/15 1355  BP: 132/70  Pulse: 68  Temp: 97.6 F (36.4 C)  TempSrc: Oral  Resp: 16  Height: '5\' 10"'$  (1.778 m)  Weight: 284 lb (128.822 kg)    Physical Exam  Constitutional: He is oriented to person, place, and time. He appears well-developed.  Obese white male in no acute distress  HENT:  Head: Normocephalic and atraumatic.  Right Ear: External ear normal.  Left Ear: External ear normal.  Mouth/Throat: Oropharynx is clear and moist.  Eyes: Conjunctivae and EOM are normal. Pupils are equal, round, and reactive to light.  Neck: Normal range of motion. Neck supple.  Cardiovascular: Normal rate, regular rhythm, normal heart sounds and intact distal pulses.   Pulmonary/Chest: Effort normal and breath sounds normal.  Abdominal: Soft. Bowel sounds are normal.  Genitourinary: Rectum normal, prostate normal and penis normal. Guaiac negative stool.  Prostate 2+ without nodule.  Musculoskeletal: Normal range of motion.  Neurological: He is alert and oriented to person, place, and time.   Skin: Skin is warm and dry.  Skin tag on right buttocks and several skin tags on right upper posterior thigh  Psychiatric: He has a normal mood and affect. His behavior is normal. Judgment and thought content normal.    Activities of Daily Living In your present state of health, do you have any difficulty performing the following activities: 05/13/2015  Hearing? N  Vision? N  Difficulty concentrating or making decisions? N  Walking or climbing stairs? N  Dressing or bathing? N  Doing errands, shopping? N    Fall Risk Assessment Fall Risk  05/13/2015  Falls in the past year? No     Depression Screen PHQ 2/9 Scores 05/13/2015  PHQ - 2 Score 0    Cognitive Testing -  6-CIT    Year: 0 4 points  Month: 0 3 points  Memorize "Pia Mau, [address] [place]"  Time (within 1 hour:) 0 3 points  Count backwards from 20: 0 2 4 points  Name months of year: 0 2 4 points  Repeat Address: 0 '2 4 6 8 10 '$ points   Total Score: 5/28  Interpretation : Normal (0-7) Abnormal (8-28)    Assessment & Plan:     Annual Wellness Visit  Reviewed patient's Family Medical History Reviewed and updated list of patient's medical providers Assessment of cognitive impairment was done Assessed patient's functional ability Established a written schedule for health screening [place] Completed and Reviewed  Exercise Activities and Dietary recommendations Goals    None      Immunization History  Administered Date(s) Administered  . Influenza, High Dose Seasonal PF 03/13/2015  . Pneumococcal Conjugate-13 03/28/2014  . Pneumococcal Polysaccharide-23 02/12/1998, 11/16/2011  . Td 04/11/2007    Health Maintenance  Topic Date Due  . Hepatitis C Screening  [birth year]45  . FOOT EXAM  07/10/1955  . OPHTHALMOLOGY EXAM  07/10/1955  . COLONOSCOPY  07/10/1995  . ZOSTAVAX  07/09/2005  . HEMOGLOBIN A1C  06/11/2015  . INFLUENZA VACCINE  11/25/2015  . TETANUS/TDAP  04/10/2017  .  PNA vac Low Risk Adult  Completed      Discussed health benefits of physical activity, and encouraged him to engage in regular exercise appropriate for his age and condition.    Miguel Aschoff MD [facility] 05/13/2015 2:05 PM  ------------------------------------------------------------------------------------------------------------

## 2015-06-09 ENCOUNTER — Other Ambulatory Visit: Payer: Self-pay | Admitting: Family Medicine

## 2015-06-16 ENCOUNTER — Emergency Department: Payer: Medicare Other

## 2015-06-16 DIAGNOSIS — Z79899 Other long term (current) drug therapy: Secondary | ICD-10-CM | POA: Insufficient documentation

## 2015-06-16 DIAGNOSIS — R918 Other nonspecific abnormal finding of lung field: Secondary | ICD-10-CM | POA: Diagnosis not present

## 2015-06-16 DIAGNOSIS — R05 Cough: Secondary | ICD-10-CM | POA: Diagnosis present

## 2015-06-16 DIAGNOSIS — Z7982 Long term (current) use of aspirin: Secondary | ICD-10-CM | POA: Diagnosis not present

## 2015-06-16 DIAGNOSIS — R042 Hemoptysis: Secondary | ICD-10-CM | POA: Diagnosis not present

## 2015-06-16 DIAGNOSIS — I712 Thoracic aortic aneurysm, without rupture: Secondary | ICD-10-CM | POA: Diagnosis not present

## 2015-06-16 DIAGNOSIS — I10 Essential (primary) hypertension: Secondary | ICD-10-CM | POA: Diagnosis not present

## 2015-06-16 DIAGNOSIS — Z87891 Personal history of nicotine dependence: Secondary | ICD-10-CM | POA: Diagnosis not present

## 2015-06-16 DIAGNOSIS — Z880 Allergy status to penicillin: Secondary | ICD-10-CM | POA: Diagnosis not present

## 2015-06-16 DIAGNOSIS — Z7984 Long term (current) use of oral hypoglycemic drugs: Secondary | ICD-10-CM | POA: Insufficient documentation

## 2015-06-16 DIAGNOSIS — E119 Type 2 diabetes mellitus without complications: Secondary | ICD-10-CM | POA: Insufficient documentation

## 2015-06-16 LAB — BASIC METABOLIC PANEL
Anion gap: 7 (ref 5–15)
BUN: 18 mg/dL (ref 6–20)
CALCIUM: 8.9 mg/dL (ref 8.9–10.3)
CO2: 29 mmol/L (ref 22–32)
CREATININE: 1.07 mg/dL (ref 0.61–1.24)
Chloride: 102 mmol/L (ref 101–111)
GFR calc Af Amer: >60 mL/min (ref >60)
Glucose, Bld: 147 mg/dL — ABNORMAL HIGH (ref 65–99)
Potassium: 4.3 mmol/L (ref 3.5–5.1)
Sodium: 138 mmol/L (ref 135–145)

## 2015-06-16 LAB — CBC
HEMATOCRIT: 30.1 % — AB (ref 40.0–52.0)
HEMOGLOBIN: 9.7 g/dL — AB (ref 13.0–18.0)
MCH: 26.3 pg (ref 26.0–34.0)
MCHC: 32.1 g/dL (ref 32.0–36.0)
MCV: 81.8 fL (ref 80.0–100.0)
Platelets: 260 10*3/uL (ref 150–440)
RBC: 3.67 MIL/uL — ABNORMAL LOW (ref 4.40–5.90)
RDW: 15.1 % — ABNORMAL HIGH (ref 11.5–14.5)
WBC: 10.1 10*3/uL (ref 3.8–10.6)

## 2015-06-16 NOTE — ED Notes (Signed)
Patient ambulatory to triage with steady gait, without difficulty or distress noted; pt reports noting blood tinged sputum this morning with coughing; st wears cpap at night and normally has a lot of mucous upon awakening; st this evening had 2nd episode; takes '81mg'$  ASA daily; denies fever, SOB, or pain

## 2015-06-17 ENCOUNTER — Encounter: Payer: Self-pay | Admitting: Radiology

## 2015-06-17 ENCOUNTER — Emergency Department: Payer: Medicare Other

## 2015-06-17 ENCOUNTER — Emergency Department
Admission: EM | Admit: 2015-06-17 | Discharge: 2015-06-17 | Disposition: A | Discharge status: Home / Self Care | Payer: Medicare Other | Attending: Emergency Medicine | Admitting: Emergency Medicine

## 2015-06-17 DIAGNOSIS — I712 Thoracic aortic aneurysm, without rupture, unspecified: Secondary | ICD-10-CM

## 2015-06-17 DIAGNOSIS — R918 Other nonspecific abnormal finding of lung field: Secondary | ICD-10-CM

## 2015-06-17 DIAGNOSIS — R042 Hemoptysis: Secondary | ICD-10-CM

## 2015-06-17 HISTORY — DX: Malignant (primary) neoplasm, unspecified: C80.1

## 2015-06-17 LAB — GLUCOSE, CAPILLARY: GLUCOSE-CAPILLARY: 126 mg/dL — AB (ref 65–99)

## 2015-06-17 MED ORDER — IOHEXOL 350 MG/ML SOLN
75.0000 mL | Freq: Once | INTRAVENOUS | Status: AC | PRN
  Administered 2015-06-17: 75 mL via INTRAVENOUS

## 2015-06-17 MED ORDER — SODIUM CHLORIDE 0.9 % IV BOLUS (SEPSIS)
500.0000 mL | INTRAVENOUS | Status: AC
  Administered 2015-06-17: 500 mL via INTRAVENOUS

## 2015-06-17 NOTE — ED Provider Notes (Signed)
[facility] Emergency Department Provider Note  ____________________________________________  Time seen: Approximately 12:34 AM  I have reviewed the triage vital signs and the nursing notes.   HISTORY  Chief Complaint Cough    HPI [name]. is a 70 y.o. male with an extensive PMH including CABG, HTN, HLD, sleep apnea, and mild COPD who presents with multiple episodes of hemoptysis today.  He states that he awoke this morning and felt normal except that he felt "congestion in my chest".  He states he always has a lot of phlegm and mucus to cough up in the morning, but when he coughed today a large blood clot came out.  He continued to cough several times and more blood came out and "filled a handkerchief".  This happened multiple more times over the course of the morning but then stopped for the rest of the late morning and early afternoon.  This evening he was sitting on the couch and felt the similar feeling of congestion in his chest and the same thing happen with multiple coughing episodes resulting in bright red blood and blood clots in his handkerchief.  He describes the symptoms as acute in onset and moderate in severity, nothing seems to make it better and nothing makes it worse.  He states that a couple weeks ago he had a "bad episode of the flu".  He has not had any upper respiratory symptoms since that time however.  He denies chest pain, shortness of breath, abdominal pain, nausea, vomiting, diarrhea.  He denies any blood in his stool.  He has never had hemoptysis in the past.   Past Medical History  Diagnosis Date  . Diabetes mellitus without complication ([place])   . Hypertension   . Hyperlipidemia   . Anxiety   . Asthma   . Cancer [facility])     Patient Active Problem List   Diagnosis Date Noted  . Chronic obstructive pulmonary disease ([place]) 02/13/2015  . Personal history of other malignant neoplasm of kidney 02/13/2015  .  Hyperparathyroidism ([place]) 02/13/2015  . Type 2 diabetes mellitus ([place]) 02/13/2015  . Arteriosclerosis of coronary artery 12/09/2014  . CAFL (chronic airflow limitation) ([place]) 12/09/2014  . Enterogastritis 12/09/2014  . Acid reflux 12/09/2014  . HPTH (hyperparathyroidism) ([place]) 12/09/2014  . BP (high blood pressure) 12/09/2014  . Eczema intertrigo 12/09/2014  . Obstructive apnea 12/09/2014  . COPD, mild ([place]) 09/21/2014  . Atherosclerosis of coronary artery 09/21/2014  . Clinical depression 09/21/2014  . Diabetes mellitus, type 2 ([place]) 09/21/2014  . Essential (primary) hypertension 09/21/2014  . Cardiac murmur 09/21/2014  . HLD (hyperlipidemia) 09/21/2014  . Adult hypothyroidism 09/21/2014  . Adiposity 09/21/2014  . 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 Malignant neoplasm of kidney ([place]) 02/09/2013    Past Surgical History  Procedure Laterality Date  . Parathyroidectomy  12/17/09  . Coronary artery bypass graft      x 5  . Back surgery      compressed vertebrae-put in a plate to seprate the spaces  . Fracture surgery      left arm repair  . Knee surgery      left knee repair    Current Outpatient Rx  Name  Route  Sig  Dispense  Refill  . albuterol (PROVENTIL HFA;VENTOLIN HFA) 108 (90 BASE) MCG/ACT inhaler   Inhalation   Inhale 1 puff into the lungs every 6 (six) hours as needed.          . ALPRAZolam (  XANAX) 0.5 MG tablet      TAKE ONE TABLET BY MOUTH TWICE DAILY AS NEEDED   60 tablet   4   . aspirin 81 MG tablet   Oral   Take 81 mg by mouth daily.          . carvedilol (COREG) 12.5 MG tablet      TAKE ONE TABLET BY MOUTH TWICE DAILY   60 tablet   12   . Cholecalciferol 1000 UNITS tablet   Oral   Take 1,000 Units by mouth daily.          . clotrimazole-betamethasone (LOTRISONE) cream   Topical   Apply 1 application topically 2 (two) times daily.   45 g   3   . FLUoxetine (PROZAC) 40 MG capsule      TAKE ONE CAPSULE BY MOUTH EVERY  DAY   30 capsule   12   . Fluticasone-Salmeterol (ADVAIR DISKUS) 100-50 MCG/DOSE AEPB   Inhalation   Inhale 1 puff into the lungs as needed.          Marland Kitchen losartan (COZAAR) 100 MG tablet      TAKE ONE TABLET BY MOUTH EVERY DAY   30 tablet   12   . meclizine (ANTIVERT) 25 MG tablet   Oral   Take 25 mg by mouth as needed.          . metFORMIN (GLUCOPHAGE) 1000 MG tablet   Oral   Take 1,000 mg by mouth 2 (two) times daily with a meal.          . omeprazole (PRILOSEC) 20 MG capsule   Oral   Take 20 mg by mouth daily.          . pioglitazone (ACTOS) 45 MG tablet      TAKE ONE TABLET BY MOUTH EACH DAY.   30 tablet   12   . simvastatin (ZOCOR) 40 MG tablet   Oral   Take 40 mg by mouth daily at 6 PM.            Allergies Ace inhibitors; Amoxicillin; Codeine; and Penicillins  Family History  Problem Relation Age of Onset  . Stroke Mother   . Cardiomyopathy Mother   . Hypertension Mother   . Hypertension Father   . Congestive Heart Failure Father   . Heart disease Brother   . Heart attack Brother   . Hypertension Brother   . Diabetes Brother   . Obesity Brother   . Heart disease Brother   . Sleep apnea Son     Social History Social History  Substance Use Topics  . Smoking status: Former Smoker -- 1.50 packs/day for 35 years    Types: Cigarettes    Quit date: 12/24/2001  . Smokeless tobacco: Never Used  . Alcohol Use: No    Review of Systems Constitutional: No fever/chills Eyes: No visual changes. ENT: No sore throat. Cardiovascular: Denies chest pain. Respiratory: Denies shortness of breath.  Multiple episodes of hemoptysis and feeling of "chest congestion" Gastrointestinal: No abdominal pain.  No nausea, no vomiting.  No diarrhea.  No constipation. Genitourinary: Negative for dysuria. Musculoskeletal: Negative for back pain. Skin: Negative for rash. Neurological: Negative for headaches, focal weakness or numbness.  10-point ROS otherwise  negative.  ____________________________________________   PHYSICAL EXAM:  VITAL SIGNS: ED Triage Vitals  Enc Vitals Group     BP 06/16/15 2009 149/73 mmHg     Pulse Rate 06/16/15 2009 80  Resp 06/16/15 2009 20     Temp 06/16/15 2009 97.9 F (36.6 C)     Temp Source 06/16/15 2009 Oral     SpO2 06/16/15 2009 97 %     Weight --      Height 06/16/15 2009 '5\' 11"'$  (1.803 m)     Head Cir --      Peak Flow --      Pain Score --      Pain Loc --      Pain Edu? --      Excl. in [place]? --     Constitutional: Alert and oriented. Well appearing and in no acute distress. Eyes: Conjunctivae are normal. PERRL. EOMI. Head: Atraumatic. Nose: No congestion/rhinnorhea. Mouth/Throat: Mucous membranes are moist.  Oropharynx non-erythematous. Neck: No stridor.   Cardiovascular: Normal rate, regular rhythm. Grossly normal heart sounds.  Good peripheral circulation. Respiratory: Normal respiratory effort.  No retractions. Lungs CTAB. Gastrointestinal: Soft and nontender. No distention. No abdominal bruits. No CVA tenderness. Musculoskeletal: No lower extremity tenderness nor edema.  No joint effusions. Neurologic:  Normal speech and language. No gross focal neurologic deficits are appreciated.  Skin:  Skin is warm, dry and intact. No rash noted. Psychiatric: Mood and affect are normal. Speech and behavior are normal.  ____________________________________________   LABS (all labs ordered are listed, but only abnormal results are displayed)  Labs Reviewed  CBC - Abnormal; Notable for the following:    RBC 3.67 (*)    Hemoglobin 9.7 (*)    HCT 30.1 (*)    RDW 15.1 (*)    All other components within normal limits  BASIC METABOLIC PANEL - Abnormal; Notable for the following:    Glucose, Bld 147 (*)    All other components within normal limits  GLUCOSE, CAPILLARY - Abnormal; Notable for the following:    Glucose-Capillary 126 (*)    All other components within normal limits    ____________________________________________  EKG  None ____________________________________________  RADIOLOGY  I, Uriah Philipson, personally discussed these images and results by phone with the on-call radiologist and used this discussion as part of my medical decision making.    Dg Chest 2 View  06/16/2015  CLINICAL DATA:  70 year old currently on CPAP, presenting with 2 episodes of hemoptysis today. EXAM: CHEST  2 VIEW COMPARISON:  08/19/2014 and earlier. FINDINGS: Prior sternotomy for CABG. Cardiac silhouette mildly to moderately enlarged, unchanged. Thoracic aorta tortuous and atherosclerotic, unchanged. Hilar and mediastinal contours otherwise unremarkable. Left hilar prominence, more so than on the prior examinations, though the lateral image suggests that it may be due to prominent central pulmonary vessels. Lungs clear. Bronchovascular markings normal. Pulmonary vascularity normal. No visible pleural effusions. No pneumothorax. Mild degenerative changes involving the thoracic spine. IMPRESSION: 1. Left hilar prominence, possibly related to prominent central pulmonary vessels. 2. Stable cardiomegaly.  No acute cardiopulmonary disease otherwise. CT of the chest with contrast is recommended in further evaluation. This does not distress at this could be done on a nonemergent basis. Electronically Signed   By: Evangeline Dakin M.D.   On: 06/16/2015 20:51   Ct Chest W Contrast  06/17/2015  CLINICAL DATA:  Acute onset of hemoptysis.  Initial encounter. EXAM: CT CHEST WITH CONTRAST TECHNIQUE: Multidetector CT imaging of the chest was performed during intravenous contrast administration. CONTRAST:  32m OMNIPAQUE IOHEXOL 350 MG/ML SOLN COMPARISON:  Chest radiograph performed 06/16/2015 FINDINGS: There is a spiculated 4.3 x 4.2 cm mass at the superior aspect of the left lower lobe.  In addition, there is a 8 mm nodule within the left upper lobe (image 32 of 73), and a 7 mm nodule at the left lower  lobe (image 49 of 73). Scattered smaller right-sided nodules are seen, measuring up to 5 mm in size. A small lymph node is seen along the right major fissure. Minimal bibasilar atelectasis is noted. No pleural effusion or pneumothorax is seen. There is a multilobulated centrally necrotic mass at the aortopulmonary window measuring 6.6 x 4.5 cm, with adjacent smaller nodules. In addition, there is a centrally necrotic 4.4 x 2.8 cm mass at the left hilum with central necrosis, and a 3.4 x 3.0 cm mass at the subcarinal region, with central necrosis and mild surrounding edema. Smaller centrally necrotic nodes are suggested at the right hilum. There is aneurysmal dilatation of the ascending thoracic aorta to 5.0 cm in AP dimension. The patient is status post median sternotomy. No pericardial effusion is identified. Diffuse coronary artery calcifications are seen. The visualized portions of the liver and spleen are grossly unremarkable. The visualized portions of the pancreas and adrenal glands are grossly unremarkable. No acute osseous abnormalities are seen. Cervical spinal fusion hardware is noted at C6-C7. IMPRESSION: 1. Multiple large centrally necrotic masses within the mediastinum, including a 6.6 x 4.5 cm multilobulated mass at the aortopulmonary window, 4.4 x 2.8 cm mass at the left hilum, and 3.4 x 3.0 cm mass at the subcarinal region, demonstrating mild surrounding edema. The centrally necrotic appearance is unusual for metastatic disease from primary bronchogenic malignancy, and may reflect metastases from the patient's renal cell carcinoma. 2. Spiculated 4.3 x 4.2 cm mass at the superior aspect of the left lower lobe. This may reflect metastatic disease or primary bronchogenic malignancy. 3. Additional scattered small bilateral pulmonary nodules may reflect metastatic disease from the patient's renal cell carcinoma. 4. Aneurysmal dilatation of the ascending thoracic aorta to 5.0 cm in AP dimension. Ascending  thoracic aortic aneurysm. Recommend semi-annual imaging followup by CTA or MRA and referral to cardiothoracic surgery if not already obtained. This recommendation follows 2010 ACCF/AHA/AATS/ACR/ASA/SCA/SCAI/SIR/STS/SVM Guidelines for the Diagnosis and Management of Patients With Thoracic Aortic Disease. Circulation. 2010; 121: [id number] 5. Diffuse coronary artery calcifications seen. Electronically Signed   By: Garald Balding M.D.   On: 06/17/2015 01:49    ____________________________________________   PROCEDURES  Procedure(s) performed: None  Critical Care performed: No ____________________________________________   INITIAL IMPRESSION / ASSESSMENT AND PLAN / ED COURSE  Pertinent labs & imaging results that were available during my care of the patient were reviewed by me and considered in my medical decision making (see chart for details).  Given extensive PMH and cardiac surgery, as well as slightly abnormal CXR, I feel emergent imaging of chest (CT w/ IV contrast) is warranted to rule out potentially life-threatening causes of hemoptysis.  Renal function normal in spite of nephrectomy.  Will give 590m NS bolus for additional kidney protection but imaging with contrast is important.  CTA unnecessary, CT IV contrast should provide appropriate and adequate views.  VSS.  Patient and wife agree with plan.  ----------------------------------------- 3:11 AM on 06/17/2015 -----------------------------------------  The patient has extensive nodules and necrotic masses in his lungs that appeared to be metastatic cancer.  I discussed this with the radiologist and I also discussed by phone with Dr. FGrayland Ormond the on-call oncologist, to ask about inpatient versus outpatient management.  Dr. FGrayland Ormondfelt that outpatient management was appropriate if the patient is stable.  Although he continues to have multiple  coughing episodes with small volume hemoptysis, I think that he is stable for outpatient  follow-up as long as the outpatient follow-up is arranged as soon as possible.  Dr. Grayland Ormond said that his office will call the patient later today to schedule a follow-up appointment at the next available opportunity.  I had an extensive discussion with the patient and his family about the diagnosis and need for close follow-up.  I explained that we could admit him for the hemoptysis but that it is unlikely they would be able to do any procedures any more rapidly than as an outpatient, and the patient prefers to go home.  I gave strict return precautions if his bleeding or hemoptysis or breathing get worse.  The patient and his family understand and agree with the plan for outpatient follow-up with Dr. Grayland Ormond.  I also discussed with him the need for outpatient follow-up about his ascending thoracic aortic aneurysm and provided the information for Dr. Faith Rogue. ____________________________________________  FINAL CLINICAL IMPRESSION(S) / ED DIAGNOSES  Final diagnoses:  Hemoptysis  Lung mass  Thoracic aortic aneurysm without rupture ([place])      NEW MEDICATIONS STARTED DURING THIS VISIT:  New Prescriptions   No medications on file     Hinda Kehr, MD 06/17/15 [phone]

## 2015-06-17 NOTE — ED Notes (Addendum)
Pt reports coughing up blood today, denies other symptoms. Pt VSS, denies any pains, no acute distress

## 2015-06-17 NOTE — Discharge Instructions (Signed)
You were evaluated today for hemoptysis (blood when you cough) and as we discussed, you have a CT scan that is very concerning for metastatic cancer.  You have multiple nodules and masses within both of your lungs.  We spoke by phone with Dr. Grayland Ormond, the cancer doctor, who will help facilitate your outpatient follow-up and additional management.  They should call your later today to schedule the next available appointment, but if you have not heard from his office by the afternoon, please call and explain you were seen in the emergency department and Dr. Grayland Ormond wanted to see you as soon as possible.  Additionally, we provided the name and number of a cardiothoracic surgeon (Dr. Genevive Bi) who you can call to discuss your 5 cm thoracic aortic aneurysm.  Return immediately to the emergency department if he develop worsening breathing issues or worsening hemoptysis, or any other symptoms that concern you.   Hemoptysis Hemoptysis, which means coughing up blood, can be a sign of a minor problem or a serious medical condition. The blood that is coughed up may come from the lungs and airways. Coughed-up blood can also come from bleeding that occurs outside the lungs and airways. Blood can drain into the windpipe during a severe nosebleed or when blood is vomited from the stomach. Because hemoptysis can be a sign of something serious, a medical evaluation is required. For some people with hemoptysis, no definite cause is ever identified. CAUSES  The most common cause of hemoptysis is bronchitis. Some other common causes include:   A ruptured blood vessel caused by coughing or an infection.   A medical condition that causes damage to the large air passageways (bronchiectasis).   A blood clot in the lungs (pulmonary embolism).   Pneumonia.   Tuberculosis.   Breathing in a small foreign object.   Cancer. For some people with hemoptysis, no definite cause is ever identified.  HOME CARE  INSTRUCTIONS  Only take over-the-counter or prescription medicines as directed by your caregiver. Do not use cough suppressants unless your caregiver approves.  If your caregiver prescribes antibiotic medicines, take them as directed. Finish them even if you start to feel better.  Do not smoke. Also avoid secondhand smoke.  Follow up with your caregiver as directed. SEEK IMMEDIATE MEDICAL CARE IF:   You cough up bloody mucus for longer than a week.  You have a blood-producing cough that is severe or getting worse.  You have a blood-producing cough thatcomes and goes over time.  You develop problems with your breathing.   You vomit blood.  You develop bloody or black-colored stools.  You have chest pain.   You develop night sweats.  You feel faint or pass out.   You have a fever or persistent symptoms for more than 2-3 days.  You have a fever and your symptoms suddenly get worse. MAKE SURE YOU:  Understand these instructions.  Will watch your condition.  Will get help right away if you are not doing well or get worse.   This information is not intended to replace advice given to you by your health care provider. Make sure you discuss any questions you have with your health care provider.   Document Released: 06/21/2001 Document Revised: 03/29/2012 Document Reviewed: 01/28/2012 Elsevier Interactive Patient Education 2016 Elsevier Inc[employer]   Pulmonary Nodule A pulmonary nodule is a small, round growth of tissue in the lung. Pulmonary nodules can range in size from less than 1/5 inch (4 mm) to a little bigger  than an inch (25 mm). Most pulmonary nodules are detected when imaging tests of the lung are being performed for a different problem. Pulmonary nodules are usually not cancerous (benign). However, some pulmonary nodules are cancerous (malignant). Follow-up treatment or testing is based on the size of the pulmonary nodule and your risk of getting lung cancer.    CAUSES Benign pulmonary nodules can be caused by various things. Some of the causes include:   Bacterial, fungal, or viral infections. This is usually an old infection that is no longer active, but it can sometimes be a current, active infection.  A benign mass of tissue.  Inflammation from conditions such as rheumatoid arthritis.   Abnormal blood vessels in the lungs. Malignant pulmonary nodules can result from lung cancer or from cancers that spread to the lung from other places in the body. SIGNS AND SYMPTOMS Pulmonary nodules usually do not cause symptoms. DIAGNOSIS Most often, pulmonary nodules are found incidentally when an X-ray or CT scan is performed to look for some other problem in the lung area. To help determine whether a pulmonary nodule is benign or malignant, your health care provider will take a medical history and order a variety of tests. Tests done may include:   Blood tests.  A skin test called a tuberculin test. This test is used to determine if you have been exposed to the germ that causes tuberculosis.   Chest X-rays. If possible, a new X-ray may be compared with X-rays you have had in the past.   CT scan. This test shows smaller pulmonary nodules more clearly than an X-ray.   Positron emission tomography (PET) scan. In this test, a safe amount of a radioactive substance is injected into the bloodstream. Then, the scan takes a picture of the pulmonary nodule. The radioactive substance is eliminated from your body in your urine.   Biopsy. A tiny piece of the pulmonary nodule is removed so it can be checked under a microscope. TREATMENT  Pulmonary nodules that are benign normally do not require any treatment because they usually do not cause symptoms or breathing problems. Your health care provider may want to monitor the pulmonary nodule through follow-up CT scans. The frequency of these CT scans will vary based on the size of the nodule and the risk factors  for lung cancer. For example, CT scans will need to be done more frequently if the pulmonary nodule is larger and if you have a history of smoking and a family history of cancer. Further testing or biopsies may be done if any follow-up CT scan shows that the size of the pulmonary nodule has increased. HOME CARE INSTRUCTIONS  Only take over-the-counter or prescription medicines as directed by your health care provider.  Keep all follow-up appointments with your health care provider. SEEK MEDICAL CARE IF:  You have trouble breathing when you are active.   You feel sick or unusually tired.   You do not feel like eating.   You lose weight without trying to.   You develop chills or night sweats.  SEEK IMMEDIATE MEDICAL CARE IF:  You cannot catch your breath, or you begin wheezing.   You cannot stop coughing.   You cough up blood.   You become dizzy or feel like you are going to pass out.   You have sudden chest pain.   You have a fever or persistent symptoms for more than 2-3 days.   You have a fever and your symptoms suddenly get worse.  MAKE SURE YOU:  Understand these instructions.  Will watch your condition.  Will get help right away if you are not doing well or get worse.   This information is not intended to replace advice given to you by your health care provider. Make sure you discuss any questions you have with your health care provider.   Document Released: 02/07/2009 Document Revised: 12/13/2012 Document Reviewed: 10/02/2012 Elsevier Interactive Patient Education 2016 [employer].  Thoracic Aortic Aneurysm An aneurysm is a bulge in an artery. It happens when the wall of the artery is weakened or damaged. If the aneurysm gets too big, it bursts (ruptures) and severe bleeding occurs. A thoracic aortic aneurysm is an aneurysm that occurs in the first part of the aorta, between the heart and the diaphragm. The aorta is the main artery and supplies blood  from the heart to the rest of the body. A thoracic aortic aneurysm can enlarge and rupture or blood can flow between the layers of the wall of the aorta through a tear (aorticdissection). Both of these conditions can cause bleeding inside the body and can be life threatening unless diagnosed and treated promptly. CAUSES  The exact cause of a thoracic aortic aneurysm is often unknown. Some contributing factors are:   A hardening of the arteries caused by the buildup of fat and other substances in the lining of a blood vessel (arteriosclerosis).  Inflammation of the walls of an artery (arteritis).  Connective tissue diseases, such as Marfan syndrome.  Injury or trauma to the aorta.  An infection, such as syphilis or staphylococcus, in the wall of the aorta (infectious aortitis) caused by bacteria. RISK FACTORS  Risk factors that contribute to a thoracic aortic aneurysm may include:  Age older than 9 years.  High blood pressure (hypertension).  Male gender.  Ethnicity (white race).  Obesity.  Family history of aneurysm (first degree relatives only).  Tobacco use. PREVENTION  The following healthy lifestyle habits may help decrease your risk of a thoracic aortic aneurysm:  Quitting smoking. Smoking can raise your blood pressure and cause arteriosclerosis.  Limiting or avoiding alcohol.  Keeping your blood pressure, blood sugar level, and cholesterol levels within normal limits.  Decreasing your salt intake. In some people, too much salt can raise blood pressure and increase your risk of abdominal aortic aneurysm.  Eating a diet low in saturated fats and cholesterol.  Increasing your fiber intake by including whole grains, vegetables, and fruits in your diet. Eating these foods may help lower blood pressure.  Maintaining a healthy weight.  Staying physically active and exercising regularly. SYMPTOMS  The symptoms of thoracic aortic aneurysm may vary depending on the size  and rate of growth of the aneurysm. Most grow slowly and do not have any symptoms. When symptoms do occur, they may include:  Pain (chest, back, sides, or abdomen). The pain may vary in intensity. A sudden onset of severe pain may indicate that the aneurysm has ruptured.  Hoarseness.  Cough.  Shortness of breath.  Swallowing problems.  Nausea or vomiting or both. DIAGNOSIS  Since most unruptured thoracic aortic aneurysms have no symptoms, they are often discovered during diagnostic exams for other conditions. An aneurysm may be found during the following procedures:  Ultrasonography (a one-time screening for thoracic aortic aneurysm by ultrasonography is also recommended for all men aged 7-75 years who have ever smoked).  X-ray exams.  A CT scan.  An MRI.  Angiography or arteriography. TREATMENT  Treatment of a thoracic  aortic aneurysm depends on the size of your aneurysm, your age, and risk factors for rupture. Medicine to control blood pressure and pain may be used to manage aneurysms smaller than 2.3 in (6 cm). Regular monitoring for enlargement may be recommended by your health care provider if:  The aneurysm is 1.2-1.5 in (3-4 cm) in size (an annual ultrasonography may be recommended).  The aneurysm is 1.5-1.8 in (4-4.5 cm) in size (an ultrasonography every 6 months may be recommended).  The aneurysm is larger than 1.8 in (4.5 cm) in size (your health care provider may ask that you be examined by a vascular surgeon). If your aneurysm is larger than 2.2 in (5.5 cm) or if it is enlarging quickly, surgical repair may be recommended. There are two main methods for repair of an aneurysm:   Endovascular repair (a minimally invasive surgery).  Open repair. This method is used if an endovascular repair is not possible.   This information is not intended to replace advice given to you by your health care provider. Make sure you discuss any questions you have with your health care  provider.   Document Released: 04/12/2005 Document Revised: 01/31/2013 Document Reviewed: 10/23/2012 Elsevier Interactive Patient Education [employer].

## 2015-06-19 ENCOUNTER — Other Ambulatory Visit: Payer: Self-pay | Admitting: *Deleted

## 2015-06-19 ENCOUNTER — Inpatient Hospital Stay: Payer: Medicare Other | Attending: Oncology | Admitting: Oncology

## 2015-06-19 VITALS — BP 134/78 | HR 87 | Temp 99.2°F | Resp 16 | Ht 70.08 in | Wt 282.2 lb

## 2015-06-19 DIAGNOSIS — Z905 Acquired absence of kidney: Secondary | ICD-10-CM

## 2015-06-19 DIAGNOSIS — I2510 Atherosclerotic heart disease of native coronary artery without angina pectoris: Secondary | ICD-10-CM

## 2015-06-19 DIAGNOSIS — F418 Other specified anxiety disorders: Secondary | ICD-10-CM | POA: Insufficient documentation

## 2015-06-19 DIAGNOSIS — Z951 Presence of aortocoronary bypass graft: Secondary | ICD-10-CM | POA: Insufficient documentation

## 2015-06-19 DIAGNOSIS — R918 Other nonspecific abnormal finding of lung field: Secondary | ICD-10-CM | POA: Insufficient documentation

## 2015-06-19 DIAGNOSIS — I712 Thoracic aortic aneurysm, without rupture, unspecified: Secondary | ICD-10-CM

## 2015-06-19 DIAGNOSIS — C642 Malignant neoplasm of left kidney, except renal pelvis: Secondary | ICD-10-CM

## 2015-06-19 DIAGNOSIS — Z87891 Personal history of nicotine dependence: Secondary | ICD-10-CM | POA: Insufficient documentation

## 2015-06-19 DIAGNOSIS — E785 Hyperlipidemia, unspecified: Secondary | ICD-10-CM | POA: Diagnosis not present

## 2015-06-19 DIAGNOSIS — Z79899 Other long term (current) drug therapy: Secondary | ICD-10-CM | POA: Insufficient documentation

## 2015-06-19 DIAGNOSIS — Z85528 Personal history of other malignant neoplasm of kidney: Secondary | ICD-10-CM | POA: Diagnosis not present

## 2015-06-19 DIAGNOSIS — E119 Type 2 diabetes mellitus without complications: Secondary | ICD-10-CM | POA: Diagnosis not present

## 2015-06-19 DIAGNOSIS — R042 Hemoptysis: Secondary | ICD-10-CM | POA: Diagnosis not present

## 2015-06-19 DIAGNOSIS — I10 Essential (primary) hypertension: Secondary | ICD-10-CM | POA: Diagnosis not present

## 2015-06-19 DIAGNOSIS — Z7984 Long term (current) use of oral hypoglycemic drugs: Secondary | ICD-10-CM

## 2015-06-19 DIAGNOSIS — J45909 Unspecified asthma, uncomplicated: Secondary | ICD-10-CM | POA: Insufficient documentation

## 2015-06-19 DIAGNOSIS — Z7982 Long term (current) use of aspirin: Secondary | ICD-10-CM | POA: Diagnosis not present

## 2015-06-19 NOTE — Progress Notes (Signed)
Patient presented to ER with hemoptysis where he had work up that included CT which was abnormal.  He has history of renal cell carcinoma 3-4 years ago that was treated at [facility].

## 2015-06-20 ENCOUNTER — Telehealth: Payer: Self-pay | Admitting: Family Medicine

## 2015-06-20 ENCOUNTER — Telehealth: Payer: Self-pay | Admitting: Oncology

## 2015-06-20 DIAGNOSIS — I719 Aortic aneurysm of unspecified site, without rupture: Secondary | ICD-10-CM

## 2015-06-20 NOTE — Telephone Encounter (Signed)
Pt is requesting referral to Dr Annamarie Major for aortic aneurysm found on chest CT done 06/17/15.Results are in chart.He states that Dr Grayland Ormond suggest that this referral be made

## 2015-06-20 NOTE — Telephone Encounter (Signed)
Patient lvm stating he has a question about apt yesterday and would like [facility] nurse to please call at either: [phone] or on his cell: [phone]. Thanks!

## 2015-06-20 NOTE — Telephone Encounter (Signed)
Spoke with patients and questions answered.

## 2015-06-20 NOTE — Telephone Encounter (Signed)
Please review-aa 

## 2015-06-23 ENCOUNTER — Other Ambulatory Visit: Payer: Self-pay | Admitting: *Deleted

## 2015-06-23 ENCOUNTER — Ambulatory Visit (INDEPENDENT_AMBULATORY_CARE_PROVIDER_SITE_OTHER): Payer: Medicare Other | Admitting: Family Medicine

## 2015-06-23 ENCOUNTER — Telehealth: Payer: Self-pay | Admitting: Oncology

## 2015-06-23 VITALS — BP 112/60 | HR 88 | Temp 97.4°F | Resp 18 | Wt 283.0 lb

## 2015-06-23 DIAGNOSIS — R52 Pain, unspecified: Secondary | ICD-10-CM

## 2015-06-23 DIAGNOSIS — J0100 Acute maxillary sinusitis, unspecified: Secondary | ICD-10-CM

## 2015-06-23 DIAGNOSIS — R509 Fever, unspecified: Secondary | ICD-10-CM

## 2015-06-23 DIAGNOSIS — I712 Thoracic aortic aneurysm, without rupture, unspecified: Secondary | ICD-10-CM

## 2015-06-23 DIAGNOSIS — J029 Acute pharyngitis, unspecified: Secondary | ICD-10-CM

## 2015-06-23 LAB — POCT RAPID STREP A (OFFICE): RAPID STREP A SCREEN: NEGATIVE

## 2015-06-23 MED ORDER — DOXYCYCLINE HYCLATE 100 MG PO TABS: 100.0000 mg | ORAL_TABLET | Freq: Two times a day (BID) | ORAL | Status: DC

## 2015-06-23 NOTE — Telephone Encounter (Signed)
I spoke with patient and he agrees to call is PCP to get appointment for flu testing.

## 2015-06-23 NOTE — Telephone Encounter (Signed)
OK to do referral

## 2015-06-23 NOTE — Telephone Encounter (Signed)
Patient has additional questions about tx and concerns about having been very sick during the weekend with a very bad cold. He said his fever has subsided and he is feeling better today but wants to talk about it with nurse. Please contact him at: [phone]. Thanks.

## 2015-06-23 NOTE — Telephone Encounter (Signed)
Order put in-aa

## 2015-06-23 NOTE — Progress Notes (Signed)
[place]  [phone] [phone]  ID: Patrick Argyle Sr. OB: [birth year]  MR#: [id number]  ALP#:[id number]  Patient Care Team: [name]., MD as PCP - General (Family Medicine)  CHIEF COMPLAINT:  Chief Complaint  Patient presents with  . New Evaluation    ER f/u for lung nodules    INTERVAL HISTORY: Patient is a 70 year old male who has a history of renal cell carcinoma status post left nephrectomy in 2013. He recently presented to the emergency room with hemoptysis. Workup included CT scan which revealed multiple masses highly suspicious for metastatic disease. He is anxious, but otherwise feels well. He denies any further hemoptysis. He has no neurologic complaints. He has a good appetite and denies weight loss. He denies any recent fevers. He has no chest pain or shortness of breath. He denies any nausea, vomiting, constipation, or diarrhea. He has no urinary complaints. Patient otherwise feels well and offers no further specific complaints.  REVIEW OF SYSTEMS:   Review of Systems  Constitutional: Negative for fever, weight loss and malaise/fatigue.  Respiratory: Positive for hemoptysis. Negative for cough and shortness of breath.   Cardiovascular: Negative for chest pain.  Gastrointestinal: Negative.   Genitourinary: Negative.   Musculoskeletal: Negative.   Neurological: Negative.  Negative for weakness.    As per HPI. Otherwise, a complete review of systems is negatve.  PAST MEDICAL HISTORY: Past Medical History  Diagnosis Date  . Diabetes mellitus without complication ([place])   . Hypertension   . Hyperlipidemia   . Anxiety   . Asthma   . Cancer [facility])     PAST SURGICAL HISTORY: Past Surgical History  Procedure Laterality Date  . Parathyroidectomy  12/17/09  . Coronary artery bypass graft      x 5  . Back surgery      compressed vertebrae-put in a plate to seprate the spaces  . Fracture surgery      left arm  repair  . Knee surgery      left knee repair    FAMILY HISTORY Family History  Problem Relation Age of Onset  . Stroke Mother   . Cardiomyopathy Mother   . Hypertension Mother   . Hypertension Father   . Congestive Heart Failure Father   . Heart disease Brother   . Heart attack Brother   . Hypertension Brother   . Diabetes Brother   . Obesity Brother   . Heart disease Brother   . Sleep apnea Son        ADVANCED DIRECTIVES:    HEALTH MAINTENANCE: Social History  Substance Use Topics  . Smoking status: Former Smoker -- 1.50 packs/day for 35 years    Types: Cigarettes    Quit date: 12/24/2001  . Smokeless tobacco: Never Used  . Alcohol Use: No     Colonoscopy:  PAP:  Bone density:  Lipid panel:  Allergies  Allergen Reactions  . Ace Inhibitors Cough    Other reaction(s): OTHER  . Amoxicillin Rash    Other reaction(s): ANAPHYLAXIS  . Codeine Rash    Other reaction(s): ANAPHYLAXIS  . Penicillins Rash    Other reaction(s): ANAPHYLAXIS    Current Outpatient Prescriptions  Medication Sig Dispense Refill  . albuterol (PROVENTIL HFA;VENTOLIN HFA) 108 (90 BASE) MCG/ACT inhaler Inhale 1 puff into the lungs every 6 (six) hours as needed.     . ALPRAZolam (XANAX) 0.5 MG tablet TAKE ONE TABLET BY MOUTH TWICE DAILY AS NEEDED 60 tablet 4  .  aspirin 81 MG tablet Take 81 mg by mouth daily.     . carvedilol (COREG) 12.5 MG tablet TAKE ONE TABLET BY MOUTH TWICE DAILY 60 tablet 12  . Cholecalciferol 1000 UNITS tablet Take 1,000 Units by mouth daily.     . clotrimazole-betamethasone (LOTRISONE) cream Apply 1 application topically 2 (two) times daily. 45 g 3  . FLUoxetine (PROZAC) 40 MG capsule TAKE ONE CAPSULE BY MOUTH EVERY DAY 30 capsule 12  . Fluticasone-Salmeterol (ADVAIR DISKUS) 100-50 MCG/DOSE AEPB Inhale 1 puff into the lungs as needed.     Marland Kitchen losartan (COZAAR) 100 MG tablet TAKE ONE TABLET BY MOUTH EVERY DAY 30 tablet 12  . meclizine (ANTIVERT) 25 MG tablet Take 25 mg  by mouth as needed.     . metFORMIN (GLUCOPHAGE) 1000 MG tablet Take 1,000 mg by mouth 2 (two) times daily with a meal.     . omeprazole (PRILOSEC) 20 MG capsule Take 20 mg by mouth daily.     . pioglitazone (ACTOS) 45 MG tablet TAKE ONE TABLET BY MOUTH EACH DAY. 30 tablet 12  . simvastatin (ZOCOR) 40 MG tablet Take 40 mg by mouth daily at 6 PM.      No current facility-administered medications for this visit.    OBJECTIVE: Filed Vitals:   06/19/15 1511  BP: 134/78  Pulse: 87  Temp: 99.2 F (37.3 C)  Resp: 16     Body mass index is 40.4 kg/(m^2).    ECOG FS:0 - Asymptomatic  General: Well-developed, well-nourished, no acute distress. Eyes: Pink conjunctiva, anicteric sclera. HEENT: Normocephalic, moist mucous membranes, clear oropharnyx. Lungs: Clear to auscultation bilaterally. Heart: Regular rate and rhythm. No rubs, murmurs, or gallops. Abdomen: Soft, nontender, nondistended. No organomegaly noted, normoactive bowel sounds. Musculoskeletal: No edema, cyanosis, or clubbing. Neuro: Alert, answering all questions appropriately. Cranial nerves grossly intact. Skin: No rashes or petechiae noted. Psych: Normal affect. Lymphatics: No cervical, calvicular, axillary or inguinal LAD.   LAB RESULTS:  Lab Results  Component Value Date   NA 138 06/16/2015   K 4.3 06/16/2015   CL 102 06/16/2015   CO2 29 06/16/2015   GLUCOSE 147* 06/16/2015   BUN 18 06/16/2015   CREATININE 1.07 06/16/2015   CALCIUM 8.9 06/16/2015   AST 14 11/21/2013   ALT 11 11/21/2013   ALKPHOS 82 11/21/2013   GFRNONAA >60 06/16/2015   GFRAA >60 06/16/2015    Lab Results  Component Value Date   WBC 10.1 06/16/2015   NEUTROABS 64 11/21/2013   HGB 9.7* 06/16/2015   HCT 30.1* 06/16/2015   MCV 81.8 06/16/2015   PLT 260 06/16/2015     STUDIES: Dg Chest 2 View  06/16/2015  CLINICAL DATA:  70 year old currently on CPAP, presenting with 2 episodes of hemoptysis today. EXAM: CHEST  2 VIEW COMPARISON:   08/19/2014 and earlier. FINDINGS: Prior sternotomy for CABG. Cardiac silhouette mildly to moderately enlarged, unchanged. Thoracic aorta tortuous and atherosclerotic, unchanged. Hilar and mediastinal contours otherwise unremarkable. Left hilar prominence, more so than on the prior examinations, though the lateral image suggests that it may be due to prominent central pulmonary vessels. Lungs clear. Bronchovascular markings normal. Pulmonary vascularity normal. No visible pleural effusions. No pneumothorax. Mild degenerative changes involving the thoracic spine. IMPRESSION: 1. Left hilar prominence, possibly related to prominent central pulmonary vessels. 2. Stable cardiomegaly.  No acute cardiopulmonary disease otherwise. CT of the chest with contrast is recommended in further evaluation. This does not distress at this could be done on a nonemergent  basis. Electronically Signed   By: Evangeline Dakin M.D.   On: 06/16/2015 20:51   Ct Chest W Contrast  06/17/2015  CLINICAL DATA:  Acute onset of hemoptysis.  Initial encounter. EXAM: CT CHEST WITH CONTRAST TECHNIQUE: Multidetector CT imaging of the chest was performed during intravenous contrast administration. CONTRAST:  43m OMNIPAQUE IOHEXOL 350 MG/ML SOLN COMPARISON:  Chest radiograph performed 06/16/2015 FINDINGS: There is a spiculated 4.3 x 4.2 cm mass at the superior aspect of the left lower lobe. In addition, there is a 8 mm nodule within the left upper lobe (image 32 of 73), and a 7 mm nodule at the left lower lobe (image 49 of 73). Scattered smaller right-sided nodules are seen, measuring up to 5 mm in size. A small lymph node is seen along the right major fissure. Minimal bibasilar atelectasis is noted. No pleural effusion or pneumothorax is seen. There is a multilobulated centrally necrotic mass at the aortopulmonary window measuring 6.6 x 4.5 cm, with adjacent smaller nodules. In addition, there is a centrally necrotic 4.4 x 2.8 cm mass at the left hilum  with central necrosis, and a 3.4 x 3.0 cm mass at the subcarinal region, with central necrosis and mild surrounding edema. Smaller centrally necrotic nodes are suggested at the right hilum. There is aneurysmal dilatation of the ascending thoracic aorta to 5.0 cm in AP dimension. The patient is status post median sternotomy. No pericardial effusion is identified. Diffuse coronary artery calcifications are seen. The visualized portions of the liver and spleen are grossly unremarkable. The visualized portions of the pancreas and adrenal glands are grossly unremarkable. No acute osseous abnormalities are seen. Cervical spinal fusion hardware is noted at C6-C7. IMPRESSION: 1. Multiple large centrally necrotic masses within the mediastinum, including a 6.6 x 4.5 cm multilobulated mass at the aortopulmonary window, 4.4 x 2.8 cm mass at the left hilum, and 3.4 x 3.0 cm mass at the subcarinal region, demonstrating mild surrounding edema. The centrally necrotic appearance is unusual for metastatic disease from primary bronchogenic malignancy, and may reflect metastases from the patient's renal cell carcinoma. 2. Spiculated 4.3 x 4.2 cm mass at the superior aspect of the left lower lobe. This may reflect metastatic disease or primary bronchogenic malignancy. 3. Additional scattered small bilateral pulmonary nodules may reflect metastatic disease from the patient's renal cell carcinoma. 4. Aneurysmal dilatation of the ascending thoracic aorta to 5.0 cm in AP dimension. Ascending thoracic aortic aneurysm. Recommend semi-annual imaging followup by CTA or MRA and referral to cardiothoracic surgery if not already obtained. This recommendation follows 2010 ACCF/AHA/AATS/ACR/ASA/SCA/SCAI/SIR/STS/SVM Guidelines for the Diagnosis and Management of Patients With Thoracic Aortic Disease. Circulation. 2010; 121: e266-e369 5. Diffuse coronary artery calcifications seen. Electronically Signed   By: JGarald BaldingM.D.   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  PLAN:    1. Renal cell carcinoma: CT scan results reviewed independently and reported as above. Patient initially had a T3a, N0, M0 grade 2 renal cell carcinoma status post left nephrectomy. His surgery date was March 06, 2012. Patient has had routine CT scans every 6 months since that time with no obvious evidence of recurrence. Because patient has an extensive smoking history, will refer to pulmonology for bronchoscopy or EBUS to obtain a biopsy to confirm the diagnosis. Will also get a PET scan and MRI the brain to complete the staging workup. Patient will return to clinic approximately one week after the biopsy to discuss the results and treatment  planning.  Approximately 45 minutes was spent in discussion of which greater than 50% was consultation.   Patient expressed understanding and was in agreement with this plan. He also understands that He can call clinic at any time with any questions, concerns, or complaints.    Lloyd Huger, MD   06/23/2015 1:42 PM

## 2015-06-23 NOTE — Telephone Encounter (Signed)
Would like to have him checked for flu.

## 2015-06-23 NOTE — Progress Notes (Addendum)
Patient ID: Patrick Moos., male   DOB: 1946-01-25, 70 y.o.   MRN: [id number]   Patrick Argyle Sr.  MRN: [id number] DOB: 10/24/1945  Subjective:  HPI   1. Sore throat The patient is a 70 year old male who presents for evaluation of his sore throat, fever and body aches.  He is scheduled for hospital procedures tomorrow and was told to check and make sure he does not have the flu.  If his flu is negative he is able to proceed with tomorrow's procedures.   The patient states he started 2 days ago with sore throat, fever, body aches, congestion, sinus and facial pain and pressure. Headache and cough.  Yesterday he got in the shower and breathed in the steam and states he feels much better today after having the fever break.  He is still having some headache but feels all his other symptoms have gone.  2. Aches Body aches secondary to recent illness.   Patient Active Problem List   Diagnosis Date Noted  . Chronic obstructive pulmonary disease ([place]) 02/13/2015  . Personal history of other malignant neoplasm of kidney 02/13/2015  . Hyperparathyroidism ([place]) 02/13/2015  . Type 2 diabetes mellitus ([place]) 02/13/2015  . Arteriosclerosis of coronary artery 12/09/2014  . CAFL (chronic airflow limitation) ([place]) 12/09/2014  . Enterogastritis 12/09/2014  . Acid reflux 12/09/2014  . HPTH (hyperparathyroidism) ([place]) 12/09/2014  . BP (high blood pressure) 12/09/2014  . Eczema intertrigo 12/09/2014  . Obstructive apnea 12/09/2014  . COPD, mild ([place]) 09/21/2014  . Atherosclerosis of coronary artery 09/21/2014  . Clinical depression 09/21/2014  . Diabetes mellitus, type 2 ([place]) 09/21/2014  . Essential (primary) hypertension 09/21/2014  . Cardiac murmur 09/21/2014  . HLD (hyperlipidemia) 09/21/2014  . Adult hypothyroidism 09/21/2014  . Adiposity 09/21/2014  . Adenocarcinoma, renal cell ([place]) 09/21/2014  . Malignant neoplasm of kidney ([place]) 02/09/2013    Past Medical History  Diagnosis  Date  . Diabetes mellitus without complication ([place])   . Hypertension   . Hyperlipidemia   . Anxiety   . Asthma   . Cancer [facility])     Social History   Social History  . Marital Status: Married    Spouse Name: N/A  . Number of Children: N/A  . Years of Education: N/A   Occupational History  . Not on file.   Social History Main Topics  . Smoking status: Former Smoker -- 1.50 packs/day for 35 years    Types: Cigarettes    Quit date: 12/24/2001  . Smokeless tobacco: Never Used  . Alcohol Use: No  . Drug Use: No  . Sexual Activity: Not on file   Other Topics Concern  . Not on file   Social History Narrative    Outpatient Prescriptions Prior to Visit  Medication Sig Dispense Refill  . albuterol (PROVENTIL HFA;VENTOLIN HFA) 108 (90 BASE) MCG/ACT inhaler Inhale 1 puff into the lungs every 6 (six) hours as needed.     . ALPRAZolam (XANAX) 0.5 MG tablet TAKE ONE TABLET BY MOUTH TWICE DAILY AS NEEDED 60 tablet 4  . carvedilol (COREG) 12.5 MG tablet TAKE ONE TABLET BY MOUTH TWICE DAILY 60 tablet 12  . Cholecalciferol 1000 UNITS tablet Take 1,000 Units by mouth daily.     . clotrimazole-betamethasone (LOTRISONE) cream Apply 1 application topically 2 (two) times daily. 45 g 3  . FLUoxetine (PROZAC) 40 MG capsule TAKE ONE CAPSULE BY MOUTH EVERY DAY 30 capsule 12  . Fluticasone-Salmeterol (ADVAIR DISKUS) 100-50 MCG/DOSE  AEPB Inhale 1 puff into the lungs as needed.     Marland Kitchen losartan (COZAAR) 100 MG tablet TAKE ONE TABLET BY MOUTH EVERY DAY 30 tablet 12  . meclizine (ANTIVERT) 25 MG tablet Take 25 mg by mouth as needed.     . metFORMIN (GLUCOPHAGE) 1000 MG tablet Take 1,000 mg by mouth 2 (two) times daily with a meal.     . omeprazole (PRILOSEC) 20 MG capsule Take 20 mg by mouth daily.     . pioglitazone (ACTOS) 45 MG tablet TAKE ONE TABLET BY MOUTH EACH DAY. 30 tablet 12  . simvastatin (ZOCOR) 40 MG tablet Take 40 mg by mouth daily at 6 PM.     . aspirin 81 MG tablet Take 81 mg by mouth  daily. Reported on 06/23/2015     No facility-administered medications prior to visit.    Allergies  Allergen Reactions  . Ace Inhibitors Cough    Other reaction(s): OTHER  . Amoxicillin Rash    Other reaction(s): ANAPHYLAXIS  . Codeine Rash    Other reaction(s): ANAPHYLAXIS  . Penicillins Rash    Other reaction(s): ANAPHYLAXIS    Review of Systems  Constitutional: Positive for malaise/fatigue. Negative for fever, chills and diaphoresis.  Respiratory: Negative for cough, hemoptysis, sputum production, shortness of breath and wheezing.   Cardiovascular: Negative for chest pain, palpitations, orthopnea and leg swelling.  Neurological: Positive for weakness and headaches. Negative for dizziness.   Objective:  BP 112/60 mmHg  Pulse 88  Temp(Src) 97.4 F (36.3 C) (Oral)  Resp 18  Wt 283 lb (128.368 kg)  Physical Exam  Constitutional: He is oriented to person, place, and time and well-developed, well-nourished, and in no distress.  Morbidly obese white male in no acute distress.  HENT:  Head: Normocephalic and atraumatic.  Right Ear: External ear normal.  Left Ear: External ear normal.  Nose: Nose normal.  Mouth/Throat: Oropharynx is clear and moist. No oropharyngeal exudate.  He does have mild bilateral maxillary sinus tenderness  Neck: Neck supple. No thyromegaly present.  Cardiovascular: Normal rate, regular rhythm and normal heart sounds.   Pulmonary/Chest: Effort normal and breath sounds normal. No respiratory distress.  Abdominal: Soft.  Lymphadenopathy:    He has no cervical adenopathy.  Neurological: He is alert and oriented to person, place, and time. Gait normal.  Skin: Skin is warm and dry.  Psychiatric: Memory, affect and judgment normal.  strep swab is negative Swab for influenza A and B is negative  Assessment and Plan :  Sore throat  Aches  Maxillary sinusitis Due to penicillin allergy we'll treat with doxycycline for one week. New diagnosis of lung  cancer in former heavy smoker Patient scheduled for bronchoscopy with tissue diagnosis tomorrow. 2013 diagnosis of renal cell carcinoma New diagnoses of thoracic aortic aneurysm To be evaluated concurrently with lung cancer evaluation More than 50% of this time spent in counseling today.  Miguel Aschoff MD [facility] 06/23/2015 2:37 PM

## 2015-06-23 NOTE — Telephone Encounter (Signed)
Forwarded to Dr. Grayland Ormond.

## 2015-06-23 NOTE — Telephone Encounter (Signed)
Patient called to say that flu and strep tests have come back negative and he is set to proceed with test tomorrow as planned

## 2015-06-24 ENCOUNTER — Ambulatory Visit
Admission: RE | Admit: 2015-06-24 | Discharge: 2015-06-24 | Disposition: A | Discharge status: Home / Self Care | Payer: Medicare Other | Source: Ambulatory Visit | Attending: Oncology | Admitting: Oncology

## 2015-06-24 DIAGNOSIS — E119 Type 2 diabetes mellitus without complications: Secondary | ICD-10-CM | POA: Diagnosis not present

## 2015-06-24 DIAGNOSIS — C642 Malignant neoplasm of left kidney, except renal pelvis: Secondary | ICD-10-CM

## 2015-06-24 DIAGNOSIS — Z85528 Personal history of other malignant neoplasm of kidney: Secondary | ICD-10-CM | POA: Insufficient documentation

## 2015-06-24 DIAGNOSIS — I2510 Atherosclerotic heart disease of native coronary artery without angina pectoris: Secondary | ICD-10-CM | POA: Diagnosis not present

## 2015-06-24 DIAGNOSIS — R590 Localized enlarged lymph nodes: Secondary | ICD-10-CM | POA: Insufficient documentation

## 2015-06-24 DIAGNOSIS — I10 Essential (primary) hypertension: Secondary | ICD-10-CM | POA: Insufficient documentation

## 2015-06-24 DIAGNOSIS — E785 Hyperlipidemia, unspecified: Secondary | ICD-10-CM | POA: Diagnosis not present

## 2015-06-24 DIAGNOSIS — E039 Hypothyroidism, unspecified: Secondary | ICD-10-CM | POA: Insufficient documentation

## 2015-06-24 DIAGNOSIS — F419 Anxiety disorder, unspecified: Secondary | ICD-10-CM | POA: Insufficient documentation

## 2015-06-24 DIAGNOSIS — R918 Other nonspecific abnormal finding of lung field: Secondary | ICD-10-CM | POA: Diagnosis present

## 2015-06-24 DIAGNOSIS — Z01810 Encounter for preprocedural cardiovascular examination: Secondary | ICD-10-CM | POA: Diagnosis present

## 2015-06-24 DIAGNOSIS — I712 Thoracic aortic aneurysm, without rupture: Secondary | ICD-10-CM | POA: Diagnosis not present

## 2015-06-24 LAB — GLUCOSE, CAPILLARY: GLUCOSE-CAPILLARY: 133 mg/dL — AB (ref 65–99)

## 2015-06-24 MED ORDER — FLUDEOXYGLUCOSE F - 18 (FDG) INJECTION
12.4700 | Freq: Once | INTRAVENOUS | Status: AC | PRN
  Administered 2015-06-24: 12.47 via INTRAVENOUS

## 2015-06-24 NOTE — Addendum Note (Signed)
Addended by: Miguel Aschoff on: 06/24/2015 09:37 AM   Modules accepted: Miquel Dunn

## 2015-06-27 ENCOUNTER — Ambulatory Visit: Payer: Medicare Other | Admitting: Anesthesiology

## 2015-06-27 ENCOUNTER — Encounter
Admission: RE | Admit: 2015-06-27 | Discharge: 2015-06-27 | Disposition: A | Discharge status: Home / Self Care | Payer: Medicare Other | Source: Ambulatory Visit | Attending: Internal Medicine | Admitting: Internal Medicine

## 2015-06-27 DIAGNOSIS — R918 Other nonspecific abnormal finding of lung field: Secondary | ICD-10-CM | POA: Diagnosis not present

## 2015-06-27 HISTORY — DX: Chronic obstructive pulmonary disease, unspecified: J44.9

## 2015-06-27 HISTORY — DX: Hyperparathyroidism, unspecified: E21.3

## 2015-06-27 HISTORY — DX: Abdominal aortic aneurysm, without rupture, unspecified: I71.40

## 2015-06-27 HISTORY — DX: Cardiac murmur, unspecified: R01.1

## 2015-06-27 HISTORY — DX: Headache: R51

## 2015-06-27 HISTORY — DX: Abdominal aortic aneurysm, without rupture: I71.4

## 2015-06-27 HISTORY — DX: Atherosclerotic heart disease of native coronary artery without angina pectoris: I25.10

## 2015-06-27 HISTORY — DX: Sleep apnea, unspecified: G47.30

## 2015-06-27 HISTORY — DX: Polyneuropathy, unspecified: G62.9

## 2015-06-27 HISTORY — DX: Hypothyroidism, unspecified: E03.9

## 2015-06-27 HISTORY — DX: Major depressive disorder, single episode, unspecified: F32.9

## 2015-06-27 HISTORY — DX: Depression, unspecified: F32.A

## 2015-06-27 HISTORY — DX: Headache, unspecified: R51.9

## 2015-06-27 HISTORY — DX: Gastro-esophageal reflux disease without esophagitis: K21.9

## 2015-06-27 HISTORY — DX: Chronic kidney disease, unspecified: N18.9

## 2015-06-27 NOTE — Pre-Procedure Instructions (Signed)
[name] called and stated she notified Dr. Mortimer Fries regarding patient AAA and stated Dr. Mortimer Fries is ok, if anesthesia is ok .

## 2015-06-27 NOTE — Pre-Procedure Instructions (Signed)
Tammy in cardiopulmonary called concerning patient history of AAA and patient seeing physician in [place] on Tuesday Mach 7.

## 2015-06-27 NOTE — Patient Instructions (Signed)
  Your procedure is scheduled CW:[id number] 6, 2017 (Monday) Report to Day Surgery.[facility]) Second Floor To find out your arrival time please call [phone] between 1PM - 3PM on June 27, 2015 (Friday).  Remember: Instructions that are not followed completely may result in serious medical risk, up to and including death, or upon the discretion of your surgeon and anesthesiologist your surgery may need to be rescheduled.    __x__ 1. Do not eat food or drink liquids after midnight. No gum chewing or hard candies.     ____ 2. No Alcohol for 24 hours before or after surgery.   ____ 3. Bring all medications with you on the day of surgery if instructed.    __x__ 4. Notify your doctor if there is any change in your medical condition     (cold, fever, infections).     Do not wear jewelry, make-up, hairpins, clips or nail polish.  Do not wear lotions, powders, or perfumes. You may wear deodorant.  Do not shave 48 hours prior to surgery. Men may shave face and neck.  Do not bring valuables to the hospital.    [facility] is not responsible for any belongings or valuables.               Contacts, dentures or bridgework may not be worn into surgery.  Leave your suitcase in the car. After surgery it may be brought to your room.  For patients admitted to the hospital, discharge time is determined by your                treatment team.   Patients discharged the day of surgery will not be allowed to drive home.   Please read over the following fact sheets that you were given:   Surgical Site Infection Prevention   ____ Take these medicines the morning of surgery with A SIP OF WATER:    1. Carvedilol  2. Omeprazole (Omeprazole at bedtime Sunday night)  3. Losartan   4.Fluoxetine  5.  6.  ____ Fleet Enema (as directed)   ____ Use CHG Soap as directed  __x__ Use inhalers on the day of surgery (Use Albuterol and Advair inhalers the morning  of surgery and bring to hospital)  _x__ Stop  metformin 2 days prior to surgery  (Stop Metformin on March 4)   ____ Take 1/2 of usual insulin dose the night before surgery and none on the morning of surgery.   __x__ Stop Coumadin/Plavix/aspirin on (Stopped Aspirin one week ago)  __x__ Stop Anti-inflammatories on (NO NSAIDS)   ____ Stop supplements until after surgery.    _x___ Bring C-Pap to the hospital.

## 2015-06-27 NOTE — Pre-Procedure Instructions (Signed)
See Note     Component Name Value Range  Vent Rate (bpm) 75   PR Interval (msec) 162   QRS Interval (msec) 92   QT Interval (msec) 404   QTc (msec) 451    Result Narrative  Undetermined rhythm Septal infarct , age undetermined Abnormal ECG No previous ECGs available I reviewed and concur with this report. Electronically signed NG:EXBM MD, KEN ([id number]) on 07/09/2014 2:35:14 PM   Status Results Details

## 2015-06-27 NOTE — Pre-Procedure Instructions (Signed)
Dr. Rosey Bath made aware of patient recent history of AAA and EKG and stated "ok " after talking with one of the vascular surgeon.

## 2015-06-30 ENCOUNTER — Encounter: Payer: Medicare Other | Admitting: Cardiothoracic Surgery

## 2015-06-30 ENCOUNTER — Ambulatory Visit
Admission: RE | Admit: 2015-06-30 | Discharge: 2015-06-30 | Disposition: A | Discharge status: Home / Self Care | Payer: Medicare Other | Source: Ambulatory Visit | Attending: Internal Medicine | Admitting: Internal Medicine

## 2015-06-30 ENCOUNTER — Encounter: Payer: Self-pay | Admitting: *Deleted

## 2015-06-30 ENCOUNTER — Encounter
Admission: RE | Disposition: A | Discharge status: Home / Self Care | Payer: Self-pay | Source: Ambulatory Visit | Attending: Internal Medicine

## 2015-06-30 DIAGNOSIS — R918 Other nonspecific abnormal finding of lung field: Secondary | ICD-10-CM | POA: Diagnosis present

## 2015-06-30 DIAGNOSIS — I714 Abdominal aortic aneurysm, without rupture: Secondary | ICD-10-CM | POA: Insufficient documentation

## 2015-06-30 DIAGNOSIS — Z538 Procedure and treatment not carried out for other reasons: Secondary | ICD-10-CM | POA: Diagnosis not present

## 2015-06-30 HISTORY — PX: ENDOBRONCHIAL ULTRASOUND: SHX5096

## 2015-06-30 LAB — GLUCOSE, CAPILLARY: Glucose-Capillary: 146 mg/dL — ABNORMAL HIGH (ref 65–99)

## 2015-06-30 SURGERY — ENDOBRONCHIAL ULTRASOUND (EBUS)
Anesthesia: Choice

## 2015-06-30 MED ORDER — SODIUM CHLORIDE 0.9 % IV SOLN
INTRAVENOUS | Status: DC
  Administered 2015-06-30: 12:00:00 via INTRAVENOUS

## 2015-06-30 NOTE — OR Nursing (Signed)
Case cancelled per Dr Andree Elk, will wait until pt appt tomorrow for eval of AAA

## 2015-07-01 ENCOUNTER — Encounter: Payer: Self-pay | Admitting: Cardiothoracic Surgery

## 2015-07-01 ENCOUNTER — Institutional Professional Consult (permissible substitution) (INDEPENDENT_AMBULATORY_CARE_PROVIDER_SITE_OTHER): Payer: Medicare Other | Admitting: Cardiothoracic Surgery

## 2015-07-01 VITALS — BP 134/80 | HR 82 | Resp 20 | Ht 71.0 in | Wt 282.0 lb

## 2015-07-01 DIAGNOSIS — Z85528 Personal history of other malignant neoplasm of kidney: Secondary | ICD-10-CM

## 2015-07-01 DIAGNOSIS — R918 Other nonspecific abnormal finding of lung field: Secondary | ICD-10-CM

## 2015-07-01 DIAGNOSIS — J984 Other disorders of lung: Secondary | ICD-10-CM

## 2015-07-01 DIAGNOSIS — I7121 Aneurysm of the ascending aorta, without rupture: Secondary | ICD-10-CM

## 2015-07-01 DIAGNOSIS — Z951 Presence of aortocoronary bypass graft: Secondary | ICD-10-CM | POA: Diagnosis not present

## 2015-07-01 DIAGNOSIS — I712 Thoracic aortic aneurysm, without rupture: Secondary | ICD-10-CM | POA: Diagnosis not present

## 2015-07-01 NOTE — Progress Notes (Signed)
PCP is [name], MD Referring Provider is Lloyd Huger, MD  Chief Complaint  Patient presents with  . Thoracic Aortic Aneurysm    Surgical eval on 5.0 cm ascending thoracic aortic aneurysm seen on PET Scan 06/24/15 , Chest CT 06/17/15     HPI:70 year old obese diabetic male with a remote history of smoking Presents for discussion of 2 recently diagnosed problems--4 cm hypermetabolic mass in the left lower lobe with extension into the AP window which has a 3 cm hypermetabolic mass on PET scan. There also hypermetabolic left-sided paratracheal nodes and subcarinal nodes. The patient had some transient hemoptysis which led to a CT scan and PET scan. He has not smoked in 14 years.there is no evidence of metastatic disease to the abdomen or skeleton on the PET scan. A brain MRI is pending this week. Patient had established care with 8 oncologist in [place]. He was scheduled for a bronchoscopy by pulmonology at [facility] under general anesthesia. He reported to the hospital 2 days ago for the procedure but the procedure was canceled by the anesthesiologist who stated he had been at risk aneurysm and that he should not be put to sleep. The patient states his blood pressure was not elevated at the time of the scheduled procedure.  Also noted on the CT scan was a fusiform aneurysm of the ascending aorta 5.0 cm, asymptomatic. The patient's cardiologist is Dr. Ubaldo Glassing in [place]. He [facility]   [facility] has done well without cardiac symptoms or recurrent angina. He has not been recathed at his had perfusion scans. Patient has hypertension and is on chronic carvedilol and losartan.  The patient presents today very frustrated and somewhat frightened about his diagnosis of lung cancer by PET scan and the new diagnosis of a ascending fusiform thoracic aneurysm 5.0 cm. His had no chest pain, no angina, no symptoms of CHF. He has mild dyspnea on exertion and  mild ankle edema. He weighs 270 pounds.  Patient has had a left nephrectomy for renal cell cancer 3-4 years ago at [facility] without evidence recurrence and he received no adjuvant chemotherapy   Past Medical History  Diagnosis Date  . Diabetes mellitus without complication ([place])   . Hypertension   . Hyperlipidemia   . Anxiety   . Asthma   . Cancer ([place])   . COPD (chronic obstructive pulmonary disease) ([place])   . Coronary artery disease   . Depression   . Heart murmur   . Hypothyroidism   . Sleep apnea     CPAP  . AAA (abdominal aortic aneurysm) ([place])   . GERD (gastroesophageal reflux disease)   . Headache   . Chronic kidney disease     Renal Cell Adenocarcinoma  . Chronic kidney disease     Left Nephrectomy  . Hyperparathyroidism ([place])   . Neuropathy [facility])     Past Surgical History  Procedure Laterality Date  . Parathyroidectomy  12/17/09  . Coronary artery bypass graft      x 5  . Back surgery      compressed vertebrae-put in a plate to seprate the spaces  . Fracture surgery      left arm repair  . Knee surgery      left knee repair  . Cardiac catheterization    . Cervical fusion    . Endobronchial ultrasound N/A 06/30/2015    Procedure: ENDOBRONCHIAL ULTRASOUND;  Surgeon: Flora Lipps, MD;  Location: [facility]  ORS;  Service: Cardiopulmonary;  Laterality: N/A;    Family History  Problem Relation Age of Onset  . Stroke Mother   . Cardiomyopathy Mother   . Hypertension Mother   . Hypertension Father   . Congestive Heart Failure Father   . Heart disease Brother   . Heart attack Brother   . Hypertension Brother   . Diabetes Brother   . Obesity Brother   . Heart disease Brother   . Sleep apnea Son     Social History Social History  Substance Use Topics  . Smoking status: Former Smoker -- 1.50 packs/day for 35 years    Types: Cigarettes    Quit date: 12/24/2001  . Smokeless tobacco: Never Used  . Alcohol Use: No    No current facility-administered  medications for this visit.   No current outpatient prescriptions on file.   Facility-Administered Medications Ordered in Other Visits  Medication Dose Route Frequency Provider Last Rate Last Dose  . 0.9 %  sodium chloride infusion   Intravenous Continuous Molli Barrows, MD 50 mL/hr at 06/30/15 1229      Allergies  Allergen Reactions  . Ace Inhibitors Cough    Other reaction(s): OTHER  . Amoxicillin Rash    Other reaction(s): ANAPHYLAXIS  . Codeine Rash    Other reaction(s): ANAPHYLAXIS  . Penicillins Rash    Other reaction(s): ANAPHYLAXIS    Review of Systems        Review of Systems :  [ y ] = yes, [  ] = no        General :  Weight gain [   ]    Weight loss  [   ]  Fatigue [  ]  Fever [  ]  Chills  [  ]                                Weakness  [  ]           HEENT    Headache [  ]  Dizziness [  ]  Blurred vision [  ] Glaucoma  [  ]                          Nosebleeds [  ] Painful or loose teeth [  ]        Cardiac :  Chest pain/ pressure [  ]  Resting SOB [  ] exertional SOB Karina.Iron  ]            Status post CABG x5 2003                        [place].Latina [  ]  Pedal edema  [  ]  Palpitations [  ] Syncope/presyncope '[ ]'$                         Paroxysmal nocturnal dyspnea [  ]         Pulmonary : cough [  ]  wheezing [  ]  Hemoptysis [ yes recently ] Sputum [  ] Snoring [  ]                              Pneumothorax [  ]  Sleep apnea [  ]  GI : Vomiting [  ]  Dysphagia [  ]  Melena  [  ]  Abdominal pain [  ] BRBPR [  ]              Heart burn [  ]  Constipation [  ] Diarrhea  [  ] Colonoscopy [   ]        GU : Hematuria [  ]  Dysuria [  ]  Nocturia [  ] UTI's [  ] status post left nephrectomy for cancer 4 years ago-renal cell        Vascular : Claudication [  ]  Rest pain [  ]  DVT [  ] Vein stripping [ right leg saphenous vein harvest for CABG ] leg ulcers [  ]                          TIA [  ] Stroke [  ]  Varicose veins [  ]        NEURO :  Headaches  [  ]  Seizures [  ] Vision changes [  ] Paresthesias [  ]                                       Seizures [  ]        Musculoskeletal :  Arthritis [  ] Gout  [  ]  Back pain [  ]  Joint pain [  ]        Skin :  Rash [  ]  Melanoma [  ] Sores [  ]        Heme : Bleeding problems [  ]Clotting Disorders [  ] Anemia [  ]Blood Transfusion '[ ]'$         Endocrine : Diabetes [ yes on oral medication ] Heat or Cold intolerance [  ] Polyuria [  ]excessive thirst '[ ]'$         Psych : Depression [  ]  Anxiety [  ]  Psych hospitalizations [  ] Memory change [  ]                                               BP 134/80 mmHg  Pulse 82  Resp 20  Ht '5\' 11"'$  (1.803 m)  Wt 282 lb (127.914 kg)  BMI 39.35 kg/m2  SpO2 94% Physical Exam      Physical Exam  General: pleasant obese Caucasian male no acute distress HEENT: Normocephalic pupils equal , dentition adequate Neck: Supple without JVD, adenopathy, or bruit Chest: Clear to auscultation, symmetrical breath sounds, no rhonchi, no tenderness             or deformity. Well-healed sternal incision Cardiovascular: Regular rate and rhythm, no murmur, no gallop, peripheral pulses             palpable in all extremities Abdomen:  Soft, nontender, no palpable mass or organomegaly. Surgical scar from nephrectomy on left Extremities: Warm, well-perfused, no clubbing cyanosis  or tenderness, mild bilateral ankle edema,              no venous stasis changes of the legs Rectal/GU: Deferred Neuro: Grossly non--focal and  symmetrical throughout Skin: Clean and dry without rash or ulceration   Diagnostic Tests: CT scan and PET scan performed at [facility] regional purse reviewed showing probable stage III bronchogenic carcinoma with a mass in the severe segment left lower lobe extending into the AP window and with subcarinal adenopathy as well.  There is smooth ascending fusiform aneurysm measuring 5.0 cm without thickening hematoma or ulceration.no surgical treatment  necessary. Recommended treatment is to control blood pressure, weight smoking, and serial routine CT scans for followup. This will be arranged.  Impression: The patient's most serious problem by far as his left lung neoplasm, probable stage III lung cancer. The patient needs tissue diagnosis and will be scheduled for bronchoscopy and EBUS at [employer]. First opening in the schedule would be March 16 and the patient be set up for surgery  Under general anesthesia as an outpatient.the patient will need a preoperative anesthesia clearance.  Plan:video bronchoscopy and ultrasound directed transbronchial biopsy for left lung mass with mediastinal adenopathy March 16 at [facility]. He'll complete his brain MRI as scheduled.   Len Childs, MD [facility] [phone]

## 2015-07-02 ENCOUNTER — Other Ambulatory Visit: Payer: Self-pay | Admitting: *Deleted

## 2015-07-02 DIAGNOSIS — R918 Other nonspecific abnormal finding of lung field: Secondary | ICD-10-CM

## 2015-07-04 ENCOUNTER — Telehealth: Payer: Self-pay

## 2015-07-04 NOTE — Telephone Encounter (Signed)
I have spoken with patient and he is going to f/u with an oncologist at the cancer center in [place] after his surgery with DR. Lucianne Lei Tright.

## 2015-07-04 NOTE — Telephone Encounter (Signed)
Received a call from Dr. Lucianne Lei Tright's office, cardiac surgeon at [facility], that he is going to continue care at their facility.  Dr. Grayland Ormond would like to confirm that he is going to follow up with an oncologist as well.

## 2015-07-08 ENCOUNTER — Encounter (HOSPITAL_COMMUNITY): Payer: Self-pay | Admitting: *Deleted

## 2015-07-08 NOTE — Progress Notes (Signed)
Anesthesia Chart Review:  Pt is a 70 year old male scheduled for video bronchoscopy with endobronchial ultrasound on 07/09/2015 with Dr. Nils Pyle.   Pt was originally scheduled for procedure at [facility] but procedure was cancelled due to presence of AAA. Please see Dr. Lucianne Lei Trigt's note dated 07/01/15 for thorough details/history. Per his note, AAA does not require surgical intervention at this time.   Pt is a same day work up.   PMH includes:  CAD (s/p CABG 2003), HTN, DM, hyperlipidemia, asthma, COPD, hypothyroidism, AAA, CKD (s/p L nephrectomy for renal cell adenocarcinoma), OSA, heart murmur, hyperparathyroidism, GERD. Former smoker. BMI 39.   Medications include: albuterol, ASA, carvedilol, advair, losartan, metformin, prilosec, pioglitazone, simvastatin.   Labs will be obtained DOS.   CT chest with contrast 06/17/15:  1. Multiple large centrally necrotic masses within the mediastinum, including a 6.6 x 4.5 cm multilobulated mass at the aortopulmonary window, 4.4 x 2.8 cm mass at the left hilum, and 3.4 x 3.0 cm mass at the subcarinal region, demonstrating mild surrounding edema. The centrally necrotic appearance is unusual for metastatic disease from primary bronchogenic malignancy, and may reflect metastases from the patient's renal cell carcinoma. 2. Spiculated 4.3 x 4.2 cm mass at the superior aspect of the left lower lobe. This may reflect metastatic disease or primary bronchogenic malignancy. 3. Additional scattered small bilateral pulmonary nodules may reflect metastatic disease from the patient's renal cell carcinoma. 4. Aneurysmal dilatation of the ascending thoracic aorta to 5.0 cm in AP dimension. Ascending thoracic aortic aneurysm. Recommend semi-annual imaging followup by CTA or MRA and referral to cardiothoracic surgery if not already obtained.  5. Diffuse coronary artery calcifications seen.  EKG 06/27/15: Sinus rhythm with short PR with occasional PVCs  Cardiac cath  02/15/08:  1. Left main appeared to be normal. 2. Circumflex. The ongoing circumflex was small after the first OM came off. The first OM was visualized and was free of disease. 3. LAD. The proximal portion of the LAD had 80% narrowing just distal to the left main. The LAD itself showed bidirectional flow in its proximal portion as did the first diagonal. 4. Right coronary artery right coronary was 100% occluded by cath in 2004. 5. Grafts: Internal mammary artery to the LAD. Internal mammary artery was a large vessel inserted into the midportion of the LAD. There was excellent visualization of the LAD, which was free of disease. There was a very large collateral bed that seemed to attached at the end of the LAD and connected with the end of the PDA. There was good visualization of the PDA and the posterior lateral vessels and even the distal RCA. This entire system was well visualized, had no severe stenoses. 6. Saphenous vein graft to the PDA, 100% occluded as ostium. 7. Saphenous vein graft to the diagonal. The graft was widely patent. The diagonal was small and free of disease and there was actually a mismatch between the size of the graft and the size of the diagonals.  If labs acceptable DOS, I anticipate pt can proceed as scheduled.   Willeen Cass, FNP-BC [facility]/Anesthesiology Phone: [phone] 07/08/2015 4:29 PM

## 2015-07-09 ENCOUNTER — Encounter (HOSPITAL_COMMUNITY)
Admission: RE | Disposition: A | Discharge status: Home / Self Care | Payer: Self-pay | Source: Ambulatory Visit | Attending: Cardiothoracic Surgery

## 2015-07-09 ENCOUNTER — Ambulatory Visit (HOSPITAL_COMMUNITY)
Admission: RE | Admit: 2015-07-09 | Discharge: 2015-07-09 | Disposition: A | Discharge status: Home / Self Care | Payer: Medicare Other | Source: Ambulatory Visit | Attending: Cardiothoracic Surgery | Admitting: Cardiothoracic Surgery

## 2015-07-09 ENCOUNTER — Ambulatory Visit (HOSPITAL_COMMUNITY): Payer: Medicare Other

## 2015-07-09 ENCOUNTER — Ambulatory Visit (HOSPITAL_COMMUNITY): Payer: Medicare Other | Admitting: Emergency Medicine

## 2015-07-09 ENCOUNTER — Encounter (HOSPITAL_COMMUNITY): Payer: Self-pay | Admitting: *Deleted

## 2015-07-09 DIAGNOSIS — I714 Abdominal aortic aneurysm, without rupture: Secondary | ICD-10-CM | POA: Diagnosis not present

## 2015-07-09 DIAGNOSIS — R918 Other nonspecific abnormal finding of lung field: Secondary | ICD-10-CM | POA: Diagnosis not present

## 2015-07-09 DIAGNOSIS — K219 Gastro-esophageal reflux disease without esophagitis: Secondary | ICD-10-CM | POA: Insufficient documentation

## 2015-07-09 DIAGNOSIS — Z951 Presence of aortocoronary bypass graft: Secondary | ICD-10-CM | POA: Insufficient documentation

## 2015-07-09 DIAGNOSIS — Z79899 Other long term (current) drug therapy: Secondary | ICD-10-CM | POA: Diagnosis not present

## 2015-07-09 DIAGNOSIS — R590 Localized enlarged lymph nodes: Secondary | ICD-10-CM | POA: Insufficient documentation

## 2015-07-09 DIAGNOSIS — Z87891 Personal history of nicotine dependence: Secondary | ICD-10-CM | POA: Insufficient documentation

## 2015-07-09 DIAGNOSIS — I2510 Atherosclerotic heart disease of native coronary artery without angina pectoris: Secondary | ICD-10-CM | POA: Insufficient documentation

## 2015-07-09 DIAGNOSIS — E785 Hyperlipidemia, unspecified: Secondary | ICD-10-CM | POA: Insufficient documentation

## 2015-07-09 DIAGNOSIS — G4733 Obstructive sleep apnea (adult) (pediatric): Secondary | ICD-10-CM | POA: Insufficient documentation

## 2015-07-09 DIAGNOSIS — E119 Type 2 diabetes mellitus without complications: Secondary | ICD-10-CM | POA: Diagnosis not present

## 2015-07-09 DIAGNOSIS — J45909 Unspecified asthma, uncomplicated: Secondary | ICD-10-CM | POA: Diagnosis not present

## 2015-07-09 DIAGNOSIS — N189 Chronic kidney disease, unspecified: Secondary | ICD-10-CM | POA: Diagnosis not present

## 2015-07-09 DIAGNOSIS — Z85528 Personal history of other malignant neoplasm of kidney: Secondary | ICD-10-CM | POA: Diagnosis not present

## 2015-07-09 DIAGNOSIS — I129 Hypertensive chronic kidney disease with stage 1 through stage 4 chronic kidney disease, or unspecified chronic kidney disease: Secondary | ICD-10-CM | POA: Insufficient documentation

## 2015-07-09 DIAGNOSIS — J449 Chronic obstructive pulmonary disease, unspecified: Secondary | ICD-10-CM | POA: Insufficient documentation

## 2015-07-09 DIAGNOSIS — C3402 Malignant neoplasm of left main bronchus: Secondary | ICD-10-CM

## 2015-07-09 DIAGNOSIS — Z905 Acquired absence of kidney: Secondary | ICD-10-CM | POA: Insufficient documentation

## 2015-07-09 DIAGNOSIS — Z7984 Long term (current) use of oral hypoglycemic drugs: Secondary | ICD-10-CM | POA: Insufficient documentation

## 2015-07-09 DIAGNOSIS — R222 Localized swelling, mass and lump, trunk: Secondary | ICD-10-CM | POA: Diagnosis not present

## 2015-07-09 DIAGNOSIS — E039 Hypothyroidism, unspecified: Secondary | ICD-10-CM | POA: Diagnosis not present

## 2015-07-09 HISTORY — DX: Reserved for inherently not codable concepts without codable children: IMO0001

## 2015-07-09 HISTORY — DX: Dermatitis, unspecified: L30.9

## 2015-07-09 HISTORY — PX: VIDEO BRONCHOSCOPY WITH ENDOBRONCHIAL ULTRASOUND: SHX6177

## 2015-07-09 HISTORY — DX: Hyperparathyroidism, unspecified: E21.3

## 2015-07-09 HISTORY — DX: Thoracic aortic aneurysm, without rupture: I71.2

## 2015-07-09 LAB — GLUCOSE, CAPILLARY
Glucose-Capillary: 139 mg/dL — ABNORMAL HIGH (ref 65–99)
Glucose-Capillary: 130 mg/dL — ABNORMAL HIGH (ref 65–99)

## 2015-07-09 LAB — COMPREHENSIVE METABOLIC PANEL
ALT: 10 U/L — ABNORMAL LOW (ref 17–63)
AST: 14 U/L — ABNORMAL LOW (ref 15–41)
Albumin: 3.4 g/dL — ABNORMAL LOW (ref 3.5–5.0)
Alkaline Phosphatase: 74 U/L (ref 38–126)
Anion gap: 11 (ref 5–15)
BUN: 13 mg/dL (ref 6–20)
CO2: 26 mmol/L (ref 22–32)
Calcium: 9.3 mg/dL (ref 8.9–10.3)
Chloride: 104 mmol/L (ref 101–111)
Creatinine, Ser: 1.04 mg/dL (ref 0.61–1.24)
GFR calc Af Amer: >60 mL/min (ref >60)
GFR calc non Af Amer: >60 mL/min (ref >60)
Glucose, Bld: 144 mg/dL — ABNORMAL HIGH (ref 65–99)
Potassium: 4.4 mmol/L (ref 3.5–5.1)
Sodium: 141 mmol/L (ref 135–145)
Total Bilirubin: 0.6 mg/dL (ref 0.3–1.2)
Total Protein: 7.2 g/dL (ref 6.5–8.1)

## 2015-07-09 LAB — CBC
HCT: 31.2 % — ABNORMAL LOW (ref 39.0–52.0)
Hemoglobin: 9.4 g/dL — ABNORMAL LOW (ref 13.0–17.0)
MCH: 25.1 pg — ABNORMAL LOW (ref 26.0–34.0)
MCHC: 30.1 g/dL (ref 30.0–36.0)
MCV: 83.4 fL (ref 78.0–100.0)
Platelets: 284 10*3/uL (ref 150–400)
RBC: 3.74 MIL/uL — ABNORMAL LOW (ref 4.22–5.81)
RDW: 15 % (ref 11.5–15.5)
WBC: 8.8 10*3/uL (ref 4.0–10.5)

## 2015-07-09 LAB — APTT: aPTT: 31 seconds (ref 24–37)

## 2015-07-09 LAB — PROTIME-INR
INR: 1.1 (ref 0.00–1.49)
Prothrombin Time: 14.3 seconds (ref 11.6–15.2)

## 2015-07-09 SURGERY — BRONCHOSCOPY, WITH EBUS
Anesthesia: General

## 2015-07-09 MED ORDER — ACETAMINOPHEN 325 MG PO TABS
650.0000 mg | ORAL_TABLET | ORAL | Status: DC | PRN
  Administered 2015-07-09: 650 mg via ORAL

## 2015-07-09 MED ORDER — ACETAMINOPHEN 500 MG PO TABS: 1000.0000 mg | ORAL_TABLET | Freq: Four times a day (QID) | ORAL | Status: DC

## 2015-07-09 MED ORDER — FENTANYL CITRATE (PF) 100 MCG/2ML IJ SOLN: 25.0000 ug | INTRAMUSCULAR | Status: DC | PRN

## 2015-07-09 MED ORDER — PROPOFOL 10 MG/ML IV BOLUS
INTRAVENOUS | Status: AC
  Filled 2015-07-09: qty 20

## 2015-07-09 MED ORDER — GUAIFENESIN-DM 100-10 MG/5ML PO SYRP
10.0000 mL | ORAL_SOLUTION | Freq: Once | ORAL | Status: AC
  Administered 2015-07-09: 10 mL via ORAL
  Filled 2015-07-09: qty 10

## 2015-07-09 MED ORDER — MIDAZOLAM HCL 5 MG/5ML IJ SOLN
INTRAMUSCULAR | Status: DC | PRN
  Administered 2015-07-09: 1 mg via INTRAVENOUS

## 2015-07-09 MED ORDER — SUGAMMADEX SODIUM 200 MG/2ML IV SOLN
INTRAVENOUS | Status: DC | PRN
  Administered 2015-07-09: 200 mg via INTRAVENOUS

## 2015-07-09 MED ORDER — HYDROCOD POLST-CPM POLST ER 10-8 MG/5ML PO SUER: 8.0000 mL | Freq: Once | ORAL | Status: DC

## 2015-07-09 MED ORDER — ACETAMINOPHEN 325 MG PO TABS
ORAL_TABLET | ORAL | Status: AC
  Filled 2015-07-09: qty 2

## 2015-07-09 MED ORDER — FENTANYL CITRATE (PF) 250 MCG/5ML IJ SOLN
INTRAMUSCULAR | Status: AC
  Filled 2015-07-09: qty 5

## 2015-07-09 MED ORDER — GLYCOPYRROLATE 0.2 MG/ML IJ SOLN
INTRAMUSCULAR | Status: DC | PRN
  Administered 2015-07-09: .2 mg via INTRAVENOUS

## 2015-07-09 MED ORDER — ROCURONIUM BROMIDE 100 MG/10ML IV SOLN
INTRAVENOUS | Status: DC | PRN
  Administered 2015-07-09: 10 mg via INTRAVENOUS
  Administered 2015-07-09: 50 mg via INTRAVENOUS

## 2015-07-09 MED ORDER — LABETALOL HCL 5 MG/ML IV SOLN
INTRAVENOUS | Status: DC | PRN
  Administered 2015-07-09: 10 mg via INTRAVENOUS

## 2015-07-09 MED ORDER — LACTATED RINGERS IV SOLN
INTRAVENOUS | Status: DC | PRN
  Administered 2015-07-09: 16:00:00 via INTRAVENOUS

## 2015-07-09 MED ORDER — LIDOCAINE HCL (CARDIAC) 20 MG/ML IV SOLN
INTRAVENOUS | Status: DC | PRN
  Administered 2015-07-09: 100 mg via INTRAVENOUS

## 2015-07-09 MED ORDER — SODIUM CHLORIDE 0.9% FLUSH: 3.0000 mL | INTRAVENOUS | Status: DC | PRN

## 2015-07-09 MED ORDER — ONDANSETRON HCL 4 MG/2ML IJ SOLN
INTRAMUSCULAR | Status: AC
  Filled 2015-07-09: qty 2

## 2015-07-09 MED ORDER — PROMETHAZINE HCL 25 MG/ML IJ SOLN
6.2500 mg | INTRAMUSCULAR | Status: DC | PRN
Start: 2015-07-09 — End: 2015-07-09

## 2015-07-09 MED ORDER — SODIUM CHLORIDE 0.9 % IV SOLN: 250.0000 mL | INTRAVENOUS | Status: DC | PRN

## 2015-07-09 MED ORDER — LACTATED RINGERS IV SOLN: INTRAVENOUS | Status: DC

## 2015-07-09 MED ORDER — LIDOCAINE HCL (CARDIAC) 20 MG/ML IV SOLN
INTRAVENOUS | Status: AC
  Filled 2015-07-09: qty 5

## 2015-07-09 MED ORDER — LIDOCAINE HCL (PF) 1 % IJ SOLN
INTRAMUSCULAR | Status: AC
  Filled 2015-07-09: qty 30

## 2015-07-09 MED ORDER — RACEPINEPHRINE HCL 2.25 % IN NEBU
INHALATION_SOLUTION | RESPIRATORY_TRACT | Status: DC | PRN
  Administered 2015-07-09: 0.5 mL via RESPIRATORY_TRACT

## 2015-07-09 MED ORDER — SUGAMMADEX SODIUM 200 MG/2ML IV SOLN
INTRAVENOUS | Status: AC
  Filled 2015-07-09: qty 2

## 2015-07-09 MED ORDER — LABETALOL HCL 5 MG/ML IV SOLN
INTRAVENOUS | Status: AC
  Filled 2015-07-09: qty 4

## 2015-07-09 MED ORDER — MEPERIDINE HCL 25 MG/ML IJ SOLN
6.2500 mg | INTRAMUSCULAR | Status: DC | PRN
Start: 2015-07-09 — End: 2015-07-09

## 2015-07-09 MED ORDER — FENTANYL CITRATE (PF) 100 MCG/2ML IJ SOLN
INTRAMUSCULAR | Status: DC | PRN
  Administered 2015-07-09 (×2): 50 ug via INTRAVENOUS

## 2015-07-09 MED ORDER — MIDAZOLAM HCL 2 MG/2ML IJ SOLN
INTRAMUSCULAR | Status: AC
  Filled 2015-07-09: qty 2

## 2015-07-09 MED ORDER — ROCURONIUM BROMIDE 50 MG/5ML IV SOLN
INTRAVENOUS | Status: AC
  Filled 2015-07-09: qty 1

## 2015-07-09 MED ORDER — ACETAMINOPHEN 650 MG RE SUPP: 650.0000 mg | RECTAL | Status: DC | PRN

## 2015-07-09 MED ORDER — SODIUM CHLORIDE 0.9% FLUSH: 3.0000 mL | Freq: Two times a day (BID) | INTRAVENOUS | Status: DC

## 2015-07-09 MED ORDER — PROPOFOL 10 MG/ML IV BOLUS
INTRAVENOUS | Status: DC | PRN
  Administered 2015-07-09: 150 mg via INTRAVENOUS

## 2015-07-09 MED ORDER — LACTATED RINGERS IV SOLN
INTRAVENOUS | Status: DC
  Administered 2015-07-09: 15:00:00 via INTRAVENOUS

## 2015-07-09 MED ORDER — CEFAZOLIN SODIUM-DEXTROSE 2-3 GM-% IV SOLR
INTRAVENOUS | Status: DC | PRN
  Administered 2015-07-09: 2 g via INTRAVENOUS

## 2015-07-09 SURGICAL SUPPLY — 31 items
BALL CTTN LRG ABS STRL LF (GAUZE/BANDAGES/DRESSINGS)
BRUSH CYTOL CELLEBRITY 1.5X140 (MISCELLANEOUS) IMPLANT
BRUSH CYTOL CELLEBRITY 1.9X150 (MISCELLANEOUS) ×2 IMPLANT
CANISTER SUCTION 2500CC (MISCELLANEOUS) ×3 IMPLANT
CONT SPEC 4OZ CLIKSEAL STRL BL (MISCELLANEOUS) ×3 IMPLANT
COTTONBALL LRG STERILE PKG (GAUZE/BANDAGES/DRESSINGS) IMPLANT
COVER DOME SNAP 22 D (MISCELLANEOUS) ×3 IMPLANT
COVER TABLE BACK 60X90 (DRAPES) ×3 IMPLANT
FORCEPS BIOP RJ4 1.8 (CUTTING FORCEPS) IMPLANT
GAUZE SPONGE 4X4 12PLY STRL (GAUZE/BANDAGES/DRESSINGS) IMPLANT
GLOVE BIO SURGEON STRL SZ7.5 (GLOVE) ×3 IMPLANT
KIT CLEAN ENDO COMPLIANCE (KITS) ×9 IMPLANT
KIT ROOM TURNOVER OR (KITS) ×3 IMPLANT
MARKER SKIN DUAL TIP RULER LAB (MISCELLANEOUS) ×3 IMPLANT
NDL BIOPSY TRANSBRONCH 21G (NEEDLE) IMPLANT
NDL BLUNT 18X1 FOR OR ONLY (NEEDLE) IMPLANT
NEEDLE 22X1 1/2 (OR ONLY) (NEEDLE) IMPLANT
NEEDLE BIOPSY TRANSBRONCH 21G (NEEDLE) IMPLANT
NEEDLE BLUNT 18X1 FOR OR ONLY (NEEDLE) IMPLANT
NEEDLE SONO TIP II EBUS (NEEDLE) ×3 IMPLANT
NS IRRIG 1000ML POUR BTL (IV SOLUTION) ×3 IMPLANT
OIL SILICONE PENTAX (PARTS (SERVICE/REPAIRS)) ×3 IMPLANT
PAD ARMBOARD 7.5X6 YLW CONV (MISCELLANEOUS) ×6 IMPLANT
SYR 20CC LL (SYRINGE) ×3 IMPLANT
SYR 20ML ECCENTRIC (SYRINGE) ×5 IMPLANT
SYR 5ML LUER SLIP (SYRINGE) ×3 IMPLANT
SYR CONTROL 10ML LL (SYRINGE) IMPLANT
TOWEL OR 17X26 10 PK STRL BLUE (TOWEL DISPOSABLE) ×3 IMPLANT
TRAP SPECIMEN MUCOUS 40CC (MISCELLANEOUS) ×3 IMPLANT
TUBE CONNECTING 20'X1/4 (TUBING) ×2
TUBE CONNECTING 20X1/4 (TUBING) ×4 IMPLANT

## 2015-07-09 NOTE — Anesthesia Preprocedure Evaluation (Signed)
Anesthesia Evaluation  Patient identified by MRN, date of birth, ID band Patient awake    Reviewed: Allergy & Precautions, NPO status , Patient's Chart, lab work & pertinent test results, reviewed documented beta blocker date and time   Airway Mallampati: I  TM Distance: >3 FB Neck ROM: Full    Dental  (+) Upper Dentures, Lower Dentures   Pulmonary asthma , sleep apnea , COPD, former smoker,    breath sounds clear to auscultation       Cardiovascular hypertension, Pt. on medications and Pt. on home beta blockers + CAD, + Past MI, + CABG and + Peripheral Vascular Disease  + Valvular Problems/Murmurs  Rhythm:Regular Rate:Normal     Neuro/Psych  Headaches, PSYCHIATRIC DISORDERS Anxiety Depression    GI/Hepatic GERD  Medicated,  Endo/Other  diabetes, Type 2, Oral Hypoglycemic AgentsHypothyroidism   Renal/GU Renal InsufficiencyRenal disease  negative genitourinary   Musculoskeletal negative musculoskeletal ROS (+)   Abdominal   Peds negative pediatric ROS (+)  Hematology negative hematology ROS (+)   Anesthesia Other Findings   Reproductive/Obstetrics negative OB ROS                             06/27/2015 EKG: normal sinus rhythm, occasional PVC noted.   Anesthesia Physical Anesthesia Plan  ASA: III  Anesthesia Plan: General   Post-op Pain Management:    Induction: Intravenous  Airway Management Planned: Oral ETT  Additional Equipment:   Intra-op Plan:   Post-operative Plan: Extubation in OR  Informed Consent: I have reviewed the patients History and Physical, chart, labs and discussed the procedure including the risks, benefits and alternatives for the proposed anesthesia with the patient or authorized representative who has indicated his/her understanding and acceptance.   Dental advisory given  Plan Discussed with: CRNA  Anesthesia Plan Comments:         Anesthesia  Quick Evaluation

## 2015-07-09 NOTE — Transfer of Care (Signed)
Immediate Anesthesia Transfer of Care Note  Patient: Patrick Breed Sr.  Procedure(s) Performed: Procedure(s): VIDEO BRONCHOSCOPY WITH ENDOBRONCHIAL ULTRASOUND (N/A)  Patient Location: PACU  Anesthesia Type:General  Level of Consciousness: awake, alert , sedated and patient cooperative  Airway & Oxygen Therapy: Patient Spontanous Breathing and Patient connected to face mask oxygen  Post-op Assessment: Report given to RN, Post -op Vital signs reviewed and stable and Patient moving all extremities  Post vital signs: Reviewed and stable  Last Vitals:  Filed Vitals:   07/09/15 1501 07/09/15 1819  BP: 131/67 158/82  Pulse: 73 99  Temp: 36.4 C 36.1 C  Resp: 20 26    Complications: No apparent anesthesia complications

## 2015-07-09 NOTE — Brief Op Note (Signed)
07/09/2015  6:09 PM  PATIENT:  Patrick Breed Sr.  70 y.o. male  PRE-OPERATIVE DIAGNOSIS:  LEFT LUNG MASS  POST-OPERATIVE DIAGNOSIS:  LEFT LUNG MASS  PROCEDURE:  Procedure(s): VIDEO BRONCHOSCOPY WITH ENDOBRONCHIAL ULTRASOUND (N/A)  SURGEON:  Surgeon(s) and Role:    * Ivin Poot, MD - Primary  PHYSICIAN ASSISTANT:   ASSISTANTS: none   ANESTHESIA:   general  EBL:     BLOOD ADMINISTERED:none  DRAINS: none   LOCAL MEDICATIONS USED:  NONE  SPECIMEN:  Aspirate, Lavage/Washing and Scraping                        Washings and brushings from LLL, EBUS of 4R node station  DISPOSITION OF SPECIMEN:  PATHOLOGY  COUNTS:  YES  TOURNIQUET:  * No tourniquets in log *  DICTATION: .Dragon Dictation  PLAN OF CARE: Discharge to home after PACU  PATIENT DISPOSITION:  PACU - hemodynamically stable.   Delay start of Pharmacological VTE agent (>24hrs) due to surgical blood loss or risk of bleeding: yes

## 2015-07-09 NOTE — Progress Notes (Signed)
The patient was examined and preop studies reviewed. There has been no change from the prior exam and the patient is ready for surgery.  Plan bronchoscopy and EBUS on [name]

## 2015-07-09 NOTE — Anesthesia Procedure Notes (Signed)
Procedure Name: Intubation Date/Time: 07/09/2015 4:56 PM Performed by: FLORES, ROBERT Pre-anesthesia Checklist: Patient identified, Emergency Drugs available, Suction available and Patient being monitored Patient Re-evaluated:Patient Re-evaluated prior to inductionOxygen Delivery Method: Circle system utilized Preoxygenation: Pre-oxygenation with 100% oxygen Intubation Type: IV induction Ventilation: Mask ventilation without difficulty Laryngoscope Size: Miller and 3 Grade View: Grade I Tube type: Oral Tube size: 9.0 mm Number of attempts: 1 Airway Equipment and Method: Stylet and LTA kit utilized Placement Confirmation: ETT inserted through vocal cords under direct vision,  positive ETCO2 and breath sounds checked- equal and bilateral Secured at: 21 cm Tube secured with: Tape Dental Injury: Teeth and Oropharynx as per pre-operative assessment      

## 2015-07-10 ENCOUNTER — Encounter (HOSPITAL_COMMUNITY): Payer: Self-pay | Admitting: Cardiothoracic Surgery

## 2015-07-10 NOTE — Op Note (Signed)
NAME:  [name], [name] NO.:  [id number]  MEDICAL RECORD NO.:  [id number]  LOCATION:  [facility]                         FACILITY:  [facility]  PHYSICIAN:  Ivin Poot, M.D.  DATE OF BIRTH:  05-02-1945  DATE OF PROCEDURE:  07/09/2015 DATE OF DISCHARGE:  07/09/2015                              OPERATIVE REPORT   OPERATION: 1. Video bronchoscopy. 2. Endoscopic bronchial ultrasound directed biopsy of mediastinal     lymph nodes (EBUS).  PREOPERATIVE DIAGNOSIS:  Left lower lobe mass with AP window adenopathy, stage III clinical diagnosis of bronchogenic carcinoma.  POSTOPERATIVE DIAGNOSIS:  Left lower lobe mass with AP window adenopathy, stage III clinical diagnosis of bronchogenic carcinoma.  SURGEON:  Ivin Poot, M.D.  ANESTHESIA:  General.  CLINICAL NOTE:  The patient is a very nice 70 year old, male, who had CABG 12 years ago.  He has done fine until recently.  He developed hemoptysis.  The chest x-ray showed AP window fullness.  CT scan showed a discrete 2.5 cm spiculated mass in the left lower lobe with adenopathy and paratracheal adenopathy in the left AP window.  He presented for evaluation and I recommended biopsy.  He had previously undergone a PET scan, which showed the mass and the adenopathy to be hypermetabolic. 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  Prior to the surgery, I discussed the procedure bronchoscopy and transbronchial ultrasound-guided biopsy of mediastinal nodes in the AP window region.  I discussed the indications, benefits, alternatives, and risks.  He understood the risks to include, pneumothorax and hemoptysis which would be expected to be self-limited.  He understood the reasons for performing the procedure to obtain a tissue diagnosis so that he could get started with probable combined chemotherapy and  radiation therapy by an oncologist.  He agreed to proceed with surgery under informed consent.  OPERATIVE PROCEDURE:  The patient was brought to the operating room, placed supine on the operating table, where general anesthesia was induced.  Through the endotracheal tube, a video bronchoscope was passed.  The bronchoscope was passed down the distal trachea and carina was normal.  The right mainstem bronchus and the endobronchial segments of the right upper, right middle, and right lower lobes were all inspected; and there were no endobronchial lesions.  The bronchoscope was then passed on the left mainstem bronchus which was normal.  The left upper lobe segments were visualized and were normal. The left lower lobe segmental bronchus to a basilar segment was extrinsically compressed, but there was no tumor seen.  There were some retained secretions in this area.  This area was irrigated for washings for cytology and several passes of the endobronchial brushing was performed to obtain to cytology as well from the left lower lobe basilar segment.  The bronchoscope was withdrawn.  Next using the EBUS scope, the enlarged nodes in the 4 L lymph node station were identified and several passes of the transbronchial biopsies were performed.  These were sent for cytology.  Bleeding was stopped with some saline irrigation and a topical dose of racemic epinephrine.  After hemostasis was assured.  The bronchoscope was removed.  The patient was then reversed from anesthesia, returned to recovery room, where chest x-ray showed no pneumothorax and no changes.     Ivin Poot, M.D.     PV/MEDQ  D:  07/10/2015  T:  07/10/2015  Job:  [id number]  cc:   Rolla Flatten, Dr.

## 2015-07-10 NOTE — Anesthesia Postprocedure Evaluation (Signed)
Anesthesia Post Note  Patient: Patrick Breed Sr.  Procedure(s) Performed: Procedure(s) (LRB): VIDEO BRONCHOSCOPY WITH ENDOBRONCHIAL ULTRASOUND (N/A)  Patient location during evaluation: PACU Anesthesia Type: General Level of consciousness: awake and alert Pain management: pain level controlled Vital Signs Assessment: post-procedure vital signs reviewed and stable Respiratory status: spontaneous breathing, nonlabored ventilation, respiratory function stable and patient connected to nasal cannula oxygen Cardiovascular status: blood pressure returned to baseline and stable Postop Assessment: no signs of nausea or vomiting Anesthetic complications: no    Last Vitals:  Filed Vitals:   07/09/15 1830 07/09/15 1845  BP: 127/74 127/72  Pulse: 108 84  Temp:  36.7 C  Resp: 22 19    Last Pain:  Filed Vitals:   07/09/15 1858  PainSc: [place] Lezette Kitts

## 2015-07-11 ENCOUNTER — Other Ambulatory Visit: Payer: Self-pay | Admitting: *Deleted

## 2015-07-11 DIAGNOSIS — R918 Other nonspecific abnormal finding of lung field: Secondary | ICD-10-CM

## 2015-07-21 ENCOUNTER — Other Ambulatory Visit: Payer: Self-pay | Admitting: Radiology

## 2015-07-22 ENCOUNTER — Encounter (HOSPITAL_COMMUNITY): Payer: Self-pay

## 2015-07-22 ENCOUNTER — Ambulatory Visit (HOSPITAL_COMMUNITY)
Admission: RE | Admit: 2015-07-22 | Discharge: 2015-07-22 | Disposition: A | Discharge status: Home / Self Care | Payer: Medicare Other | Source: Ambulatory Visit | Attending: Cardiothoracic Surgery | Admitting: Cardiothoracic Surgery

## 2015-07-22 ENCOUNTER — Other Ambulatory Visit (HOSPITAL_COMMUNITY): Payer: Medicare Other

## 2015-07-22 ENCOUNTER — Ambulatory Visit (HOSPITAL_COMMUNITY)
Admission: RE | Admit: 2015-07-22 | Discharge: 2015-07-22 | Disposition: A | Discharge status: Home / Self Care | Payer: Medicare Other | Source: Ambulatory Visit | Attending: Interventional Radiology | Admitting: Interventional Radiology

## 2015-07-22 DIAGNOSIS — Z9889 Other specified postprocedural states: Secondary | ICD-10-CM | POA: Diagnosis not present

## 2015-07-22 DIAGNOSIS — J984 Other disorders of lung: Secondary | ICD-10-CM | POA: Diagnosis present

## 2015-07-22 DIAGNOSIS — R918 Other nonspecific abnormal finding of lung field: Secondary | ICD-10-CM

## 2015-07-22 DIAGNOSIS — C7802 Secondary malignant neoplasm of left lung: Secondary | ICD-10-CM | POA: Insufficient documentation

## 2015-07-22 LAB — PROTIME-INR
INR: 1.1 (ref 0.00–1.49)
PROTHROMBIN TIME: 14.4 s (ref 11.6–15.2)

## 2015-07-22 LAB — CBC
HCT: 30.6 % — ABNORMAL LOW (ref 39.0–52.0)
HEMOGLOBIN: 9.2 g/dL — AB (ref 13.0–17.0)
MCH: 24.7 pg — AB (ref 26.0–34.0)
MCHC: 30.1 g/dL (ref 30.0–36.0)
MCV: 82.3 fL (ref 78.0–100.0)
Platelets: 260 10*3/uL (ref 150–400)
RBC: 3.72 MIL/uL — AB (ref 4.22–5.81)
RDW: 15 % (ref 11.5–15.5)
WBC: 8.8 10*3/uL (ref 4.0–10.5)

## 2015-07-22 LAB — GLUCOSE, CAPILLARY
Glucose-Capillary: 147 mg/dL — ABNORMAL HIGH (ref 65–99)
Glucose-Capillary: 149 mg/dL — ABNORMAL HIGH (ref 65–99)

## 2015-07-22 LAB — APTT: aPTT: 33 seconds (ref 24–37)

## 2015-07-22 MED ORDER — LIDOCAINE HCL 1 % IJ SOLN
INTRAMUSCULAR | Status: AC
  Filled 2015-07-22: qty 20

## 2015-07-22 MED ORDER — MIDAZOLAM HCL 2 MG/2ML IJ SOLN
INTRAMUSCULAR | Status: AC | PRN
  Administered 2015-07-22: 1 mg via INTRAVENOUS
  Administered 2015-07-22 (×2): 0.5 mg via INTRAVENOUS

## 2015-07-22 MED ORDER — SODIUM CHLORIDE 0.9 % IV SOLN
INTRAVENOUS | Status: AC | PRN
  Administered 2015-07-22: 10 mL/h via INTRAVENOUS

## 2015-07-22 MED ORDER — FENTANYL CITRATE (PF) 100 MCG/2ML IJ SOLN
INTRAMUSCULAR | Status: AC | PRN
  Administered 2015-07-22: 50 ug via INTRAVENOUS
  Administered 2015-07-22: 25 ug via INTRAVENOUS

## 2015-07-22 MED ORDER — FENTANYL CITRATE (PF) 100 MCG/2ML IJ SOLN
INTRAMUSCULAR | Status: AC
  Filled 2015-07-22: qty 4

## 2015-07-22 MED ORDER — SODIUM CHLORIDE 0.9 % IV SOLN: Freq: Once | INTRAVENOUS | Status: DC

## 2015-07-22 MED ORDER — MIDAZOLAM HCL 2 MG/2ML IJ SOLN
INTRAMUSCULAR | Status: AC
  Filled 2015-07-22: qty 4

## 2015-07-22 NOTE — Procedures (Signed)
Interventional Radiology Procedure Note  Procedure: CT guided biopsy of LLL pulmonary nodule.  Complications: No immediate Recommendations: - Bedrest until CXR cleared.  Minimize talking, coughing or otherwise straining.  - Follow up 2 hr CXR pending   Signed,  Heath K. McCullough, MD   

## 2015-07-22 NOTE — Discharge Instructions (Signed)

## 2015-07-22 NOTE — H&P (Signed)
Chief Complaint: Patient was seen in consultation today for Left lung mass biopsy at the request of Ivin Poot  Referring Physician(s): Ivin Poot  Supervising Physician: Jacqulynn Cadet  History of Present Illness: Patrick Whitmire. is a 70 y.o. male   Pt with Hx renal cell cancer Left nephrectomy 2013 Followed by Oncology with no recureence Developed hemoptysis 05/2015 CXR and CT revealed multiple lung nodules +PET: IMPRESSION: Status post left nephrectomy.  3.6 x 4.3 cm hypermetabolic spiculated left lower lobe mass, suspicious for primary bronchogenic neoplasm or less likely metastasis.  Additional scattered bilateral pulmonary nodules measuring up to 9 mm, non FDG avid but beneath the size threshold for PET sensitivity, suspicious for metastases.  Associated hypermetabolic thoracic lymphadenopathy, suspicious for nodal metastases.  No evidence of metastatic disease in the abdomen/ pelvis.  Referred to Dr Prescott Gum Bronchoscopy 07/09/15: FINE NEEDLE ASPIRATION, ENDOSCOPIC EBUS (D), 4L #2 (SPECIMEN 4 OF 4 COLLECTED 07-09-2015) ATYPICAL CELLS PRESENT. SEE COMMENT. COMMENT: THE ATYPICAL CELLS ARE NOT DIAGNOSTIC OF MALIGNANCY IN MY OPINION.  Now request for needle biopsy Scheduled today  Past Medical History  Diagnosis Date  . Hypertension   . Hyperlipidemia   . Anxiety   . Asthma   . COPD (chronic obstructive pulmonary disease) ([place])   . Coronary artery disease   . Depression   . Heart murmur   . Hypothyroidism   . Sleep apnea     CPAP  . AAA (abdominal aortic aneurysm) ([place])   . GERD (gastroesophageal reflux disease)   . Headache   . Chronic kidney disease     Renal Cell Adenocarcinoma  . Chronic kidney disease     Left Nephrectomy  . Hyperparathyroidism ([place])   . Neuropathy ([place])   . Cancer [facility]     Renal Cell- 2013; Lung 2017  . Eczema   . HPTH (hyperparathyroidism) ([place])   . Thoracic aortic aneurysm ([place]) 2017  . Shortness  of breath dyspnea   . Diabetes mellitus without complication ([place])     Type II    Past Surgical History  Procedure Laterality Date  . Parathyroidectomy  12/17/09  . Coronary artery bypass graft      x 5  . Back surgery      compressed vertebrae-put in a plate to seprate the spaces  . Fracture surgery      left arm repair  . Knee surgery      left knee repair  . Cardiac catheterization    . Cervical fusion    . Endobronchial ultrasound N/A 06/30/2015    Procedure: ENDOHIAL ULTRASOUND;  Surgeon: Flora Lipps, MD;  Location: [facility] ORS;  Service: Cardiopulmonary;  Laterality: N/A;  . Video bronchoscopy with endobronchial ultrasound N/A 07/09/2015    Procedure: VIDEO BRONCHOSCOPY WITH ENDOBRONCHIAL ULTRASOUND;  Surgeon: Ivin Poot, MD;  Location: [facility] OR;  Service: Thoracic;  Laterality: N/A;    Allergies: Amoxicillin; Codeine; Penicillins; and Ace inhibitors  Medications: Prior to Admission medications   Medication Sig Start Date End Date Taking? Authorizing Provider  albuterol (PROVENTIL HFA;VENTOLIN HFA) 108 (90 BASE) MCG/ACT inhaler Inhale 1 puff into the lungs every 6 (six) hours as needed for wheezing.    Yes Historical Provider, MD  ALPRAZolam (XANAX) 0.5 MG tablet TAKE ONE TABLET BY MOUTH TWICE DAILY AS NEEDED Patient taking differently: TAKE 0.5 MG BY MOUTH TWICE DAILY AS NEEDED FOR ANXIETY 03/05/15  Yes [name]., MD  aspirin 81 MG tablet Take 81 mg by mouth  daily. Reported on 07/01/2015 05/27/11  Yes Historical Provider, MD  carvedilol (COREG) 12.5 MG tablet TAKE ONE TABLET BY MOUTH TWICE DAILY Patient taking differently: TAKE 12.5 MG BY MOUTH TWICE DAILY 06/09/15  Yes [name]., MD  Cholecalciferol 1000 UNITS tablet Take 1,000 Units by mouth daily.    Yes Historical Provider, MD  FLUoxetine (PROZAC) 40 MG capsule TAKE ONE CAPSULE BY MOUTH EVERY DAY Patient taking differently: TAKE 40 MG BY MOUTH EVERY DAY 03/25/15  Yes Richard Maceo Pro., MD    Fluticasone-Salmeterol (ADVAIR DISKUS) 100-50 MCG/DOSE AEPB Inhale 1 puff into the lungs as needed (for wheezing).    Yes Historical Provider, MD  losartan (COZAAR) 100 MG tablet TAKE ONE TABLET BY MOUTH EVERY DAY Patient taking differently: TAKE 100 MG BY MOUTH EVERY DAY 03/25/15  Yes Richard Maceo Pro., MD  meclizine (ANTIVERT) 25 MG tablet Take 25 mg by mouth as needed for dizziness.  06/18/13  Yes Historical Provider, MD  metFORMIN (GLUCOPHAGE) 1000 MG tablet Take 1,000 mg by mouth 2 (two) times daily with a meal. Reported on 07/01/2015 09/11/14  Yes Historical Provider, MD  omeprazole (PRILOSEC) 20 MG capsule Take 20 mg by mouth daily.  07/24/14  Yes Historical Provider, MD  pioglitazone (ACTOS) 45 MG tablet TAKE ONE TABLET BY MOUTH EACH DAY. Patient taking differently: TAKE 45 MG BY MOUTH EACH DAY. 02/03/15  Yes Richard Maceo Pro., MD  simvastatin (ZOCOR) 40 MG tablet Take 40 mg by mouth daily at 6 PM.  07/24/14  Yes Historical Provider, MD     Family History  Problem Relation Age of Onset  . Stroke Mother   . Cardiomyopathy Mother   . Hypertension Mother   . Hypertension Father   . Congestive Heart Failure Father   . Heart disease Brother   . Heart attack Brother   . Hypertension Brother   . Diabetes Brother   . Obesity Brother   . Heart disease Brother   . Sleep apnea Son     Social History   Social History  . Marital Status: Married    Spouse Name: N/A  . Number of Children: N/A  . Years of Education: N/A   Social History Main Topics  . Smoking status: Former Smoker -- 1.50 packs/day for 35 years    Types: Cigarettes    Quit date: 12/24/2001  . Smokeless tobacco: Never Used  . Alcohol Use: No  . Drug Use: No  . Sexual Activity: Not Asked   Other Topics Concern  . None   Social History Narrative     Review of Systems: A 12 point ROS discussed and pertinent positives are indicated in the HPI above.  All other systems are negative.  Review of Systems   Constitutional: Negative for fever, activity change, appetite change, fatigue and unexpected weight change.  Respiratory: Positive for cough. Negative for shortness of breath and wheezing.   Neurological: Negative for weakness.  Psychiatric/Behavioral: Negative for behavioral problems and confusion.    Vital Signs: BP 129/69 mmHg  Temp(Src) 97.8 F (36.6 C) (Oral)  Resp 18  Ht '5\' 11"'$  (1.803 m)  Wt 283 lb (128.368 kg)  BMI 39.49 kg/m2  SpO2 95%  Physical Exam  Constitutional: He is oriented to person, place, and time.  Cardiovascular: Normal rate, regular rhythm and normal heart sounds.   Pulmonary/Chest: Effort normal and breath sounds normal. He has no wheezes.  Abdominal: Soft. Bowel sounds are normal. There is no tenderness.  Musculoskeletal:  Normal range of motion.  Neurological: He is alert and oriented to person, place, and time.  Skin: Skin is warm and dry.  Psychiatric: He has a normal mood and affect. His behavior is normal. Judgment and thought content normal.  Nursing note and vitals reviewed.   Mallampati Score:  MD Evaluation Airway: WNL Heart: WNL Abdomen: WNL Chest/ Lungs: WNL ASA  Classification: 3 Mallampati/Airway Score: Two  Imaging: Dg Chest 2 View  07/09/2015  CLINICAL DATA:  Preoperative evaluation for upcoming bronchoscopy EXAM: CHEST  2 VIEW COMPARISON:  06/24/2015 FINDINGS: Cardiac shadow is stable. The known left lower lobe mass is again seen with fullness in the left hilar region consistent with the known lymphadenopathy. No focal confluent infiltrate is seen. Postoperative changes are noted. IMPRESSION: Changes consistent with the known left lower lobe mass and hilar and mediastinal adenopathy. No acute abnormality is noted Electronically Signed   By: Inez Catalina M.D.   On: 07/09/2015 14:37   Nm Pet Image Restag (ps) Skull Base To Thigh  06/24/2015  CLINICAL DATA:  Subsequent treatment strategy for new pulmonary masses with history of left  renal cell cancer status post nephrectomy. EXAM: NUCLEAR MEDICINE PET SKULL BASE TO THIGH TECHNIQUE: 12.47 mCi F-18 FDG was injected intravenously. Full-ring PET imaging was performed from the skull base to thigh after the radiotracer. CT data was obtained and used for attenuation correction and anatomic localization. FASTING BLOOD GLUCOSE:  Value: 133 mg/dl COMPARISON:  CT chest dated 06/17/2015 FINDINGS: NECK No hypermetabolic lymph nodes in the neck. CHEST 3.6 x 4.3 cm spiculated left lower lobe mass (series 3/ image 100), max SUV 10.4, suspicious for primary bronchogenic neoplasm or less likely metastasis. Small bilateral pulmonary nodules in the bilateral upper lobes and left lower lobe measuring up to 9 mm (series 3/image 103), non FDG avid but beneath the size threshold for PET sensitivity, suspicious for metastases. Associated hypermetabolic thoracic lymphadenopathy, including: --3.0 cm left paratracheal node (series 3/image 89), max SUV 10.0 --2.9 cm short axis AP window node (series 3/ image 90), max SUV 6.2 --1.9 cm short axis right hilar node (series 3/ image 103), max SUV 5.1 --2.7 cm short axis subcarinal node (series 3/ image 104), max SUV 7.4 The heart is mildly enlarged. No pericardial effusion. Coronary atherosclerosis. Postsurgical changes related to prior CABG. Atherosclerotic calcifications of the aortic arch. 5.0 cm ascending thoracic aortic aneurysm (series 3/ image 107). ABDOMEN/PELVIS No abnormal hypermetabolic activity within the liver, pancreas, adrenal glands, or spleen. Status post left nephrectomy. Atherosclerotic calcifications the abdominal aorta and branch vessels. No hypermetabolic lymph nodes in the abdomen or pelvis. SKELETON No focal hypermetabolic activity to suggest skeletal metastasis. IMPRESSION: Status post left nephrectomy. 3.6 x 4.3 cm hypermetabolic spiculated left lower lobe mass, suspicious for primary bronchogenic neoplasm or less likely metastasis. Additional  scattered bilateral pulmonary nodules measuring up to 9 mm, non FDG avid but beneath the size threshold for PET sensitivity, suspicious for metastases. Associated hypermetabolic thoracic lymphadenopathy, suspicious for nodal metastases. No evidence of metastatic disease in the abdomen/ pelvis. 5.0 cm ascending thoracic aortic aneurysm. Recommend semi-annual imaging followup by CTA or MRA and referral to cardiothoracic surgery if not already obtained. This recommendation follows 2010 ACCF/AHA/AATS/ACR/ASA/SCA/SCAI/SIR/STS/SVM Guidelines for the Diagnosis and Management of Patients With Thoracic Aortic Disease. Circulation. 2010; 121: Y073-X10 Electronically Signed   By: Julian Hy M.D.   On: 06/24/2015 15:04   Dg Chest Port 1 View  07/09/2015  CLINICAL DATA:  Status post bronchoscopy EXAM: PORTABLE CHEST  1 VIEW COMPARISON:  07/09/2015 FINDINGS: Fullness is again noted in the left hilar region. No pneumothorax is seen. The lungs are well aerated. Postsurgical changes are again seen. IMPRESSION: No pneumothorax following bronchoscopy. Electronically Signed   By: Inez Catalina M.D.   On: 07/09/2015 18:42    Labs:  CBC:  Recent Labs  06/16/15 2013 07/09/15 1435 07/22/15 0620  WBC 10.1 8.8 8.8  HGB 9.7* 9.4* 9.2*  HCT 30.1* 31.2* 30.6*  PLT 260 284 260    COAGS:  Recent Labs  07/09/15 1435 07/22/15 0620  INR 1.10  --   APTT 31 33    BMP:  Recent Labs  06/16/15 2013 07/09/15 1435  NA 138 141  K 4.3 4.4  CL 102 104  CO2 29 26  GLUCOSE 147* 144*  BUN 18 13  CALCIUM 8.9 9.3  CREATININE 1.07 1.04  GFRNONAA >60 >60  GFRAA >60 >60    LIVER FUNCTION TESTS:  Recent Labs  07/09/15 1435  BILITOT 0.6  AST 14*  ALT 10*  ALKPHOS 74  PROT 7.2  ALBUMIN 3.4*    TUMOR MARKERS: No results for input(s): AFPTM, CEA, CA199, CHROMGRNA in the last 8760 hours.  Assessment and Plan:  Hx renal cell ca New hemoptysis 05/2015 Bronch - non diagnostic 07/09/15 Now scheduled for  needle bx in IR Risks and Benefits discussed with the patient including, but not limited to bleeding, hemoptysis, respiratory failure requiring intubation, infection, pneumothorax requiring chest tube placement, stroke from air embolism or even death. All of the patient's questions were answered, patient is agreeable to proceed. Consent signed and in chart.   Thank you for this interesting consult.  I greatly enjoyed meeting Patrick GOODENOW Sr. and look forward to participating in their care.  A copy of this report was sent to the requesting provider on this date.  Electronically Signed: Monia Sabal A 07/22/2015, 7:47 AM   I spent a total of  30 Minutes   in face to face in clinical consultation, greater than 50% of which was counseling/coordinating care for Left lung mass bx

## 2015-07-28 ENCOUNTER — Telehealth: Payer: Self-pay | Admitting: Internal Medicine

## 2015-07-28 ENCOUNTER — Encounter: Payer: Self-pay | Admitting: *Deleted

## 2015-07-28 DIAGNOSIS — Z85528 Personal history of other malignant neoplasm of kidney: Secondary | ICD-10-CM

## 2015-07-28 NOTE — Progress Notes (Signed)
Oncology Nurse Navigator Documentation  Oncology Nurse Navigator Flowsheets 07/28/2015  Navigator Location [facility]  Navigator Encounter Type Other  Barriers/Navigation Needs Coordination of Care  Interventions Coordination of Care  Coordination of Care Appts  Time Spent with Patient 15   I received referral on [name].  He has metastatic renal cell carcinoma and is requesting to see Dr. [name].  I updated Dr. [name].  I notified HIM dept to schedule with Dr. [name] on 08/06/15.  I completed order for labs per Dr. [name].

## 2015-07-28 NOTE — Telephone Encounter (Signed)
PT AWARE OF NP APPT ON 08/06/15'@1'$ :45 REFERRING DR VAN TRIGT DX-METASTATIC RENAL CELL

## 2015-08-06 ENCOUNTER — Encounter: Payer: Self-pay | Admitting: Internal Medicine

## 2015-08-06 ENCOUNTER — Other Ambulatory Visit (HOSPITAL_BASED_OUTPATIENT_CLINIC_OR_DEPARTMENT_OTHER): Payer: Medicare Other

## 2015-08-06 ENCOUNTER — Telehealth: Payer: Self-pay | Admitting: Internal Medicine

## 2015-08-06 ENCOUNTER — Ambulatory Visit (HOSPITAL_BASED_OUTPATIENT_CLINIC_OR_DEPARTMENT_OTHER): Payer: Medicare Other | Admitting: Internal Medicine

## 2015-08-06 VITALS — BP 150/69 | HR 74 | Temp 98.4°F | Resp 18 | Ht 71.0 in | Wt 272.9 lb

## 2015-08-06 DIAGNOSIS — R591 Generalized enlarged lymph nodes: Secondary | ICD-10-CM

## 2015-08-06 DIAGNOSIS — R918 Other nonspecific abnormal finding of lung field: Secondary | ICD-10-CM

## 2015-08-06 DIAGNOSIS — I712 Thoracic aortic aneurysm, without rupture: Secondary | ICD-10-CM

## 2015-08-06 DIAGNOSIS — C642 Malignant neoplasm of left kidney, except renal pelvis: Secondary | ICD-10-CM

## 2015-08-06 DIAGNOSIS — D642 Secondary sideroblastic anemia due to drugs and toxins: Secondary | ICD-10-CM | POA: Diagnosis not present

## 2015-08-06 DIAGNOSIS — C7931 Secondary malignant neoplasm of brain: Secondary | ICD-10-CM | POA: Diagnosis not present

## 2015-08-06 DIAGNOSIS — Z85528 Personal history of other malignant neoplasm of kidney: Secondary | ICD-10-CM

## 2015-08-06 LAB — COMPREHENSIVE METABOLIC PANEL
ALBUMIN: 3.3 g/dL — AB (ref 3.5–5.0)
ALK PHOS: 77 U/L (ref 40–150)
ANION GAP: 9 meq/L (ref 3–11)
AST: 10 U/L (ref 5–34)
BILIRUBIN TOTAL: 0.53 mg/dL (ref 0.20–1.20)
BUN: 14.8 mg/dL (ref 7.0–26.0)
CO2: 28 meq/L (ref 22–29)
Calcium: 9.5 mg/dL (ref 8.4–10.4)
Chloride: 103 mEq/L (ref 98–109)
Creatinine: 1 mg/dL (ref 0.7–1.3)
EGFR: 75 mL/min/{1.73_m2} — AB (ref >90)
GLUCOSE: 116 mg/dL (ref 70–140)
Potassium: 4.5 mEq/L (ref 3.5–5.1)
SODIUM: 140 meq/L (ref 136–145)
TOTAL PROTEIN: 7 g/dL (ref 6.4–8.3)

## 2015-08-06 LAB — LACTATE DEHYDROGENASE: LDH: 175 U/L (ref 125–245)

## 2015-08-06 LAB — CBC WITH DIFFERENTIAL/PLATELET
BASO%: 1 % (ref 0.0–2.0)
Basophils Absolute: 0.1 10*3/uL (ref 0.0–0.1)
EOS ABS: 0.6 10*3/uL — AB (ref 0.0–0.5)
EOS%: 7.1 % — ABNORMAL HIGH (ref 0.0–7.0)
HCT: 30.6 % — ABNORMAL LOW (ref 38.4–49.9)
HEMOGLOBIN: 9.5 g/dL — AB (ref 13.0–17.1)
LYMPH%: 19.9 % (ref 14.0–49.0)
MCH: 25 pg — ABNORMAL LOW (ref 27.2–33.4)
MCHC: 31.1 g/dL — ABNORMAL LOW (ref 32.0–36.0)
MCV: 80.3 fL (ref 79.3–98.0)
MONO#: 0.6 10*3/uL (ref 0.1–0.9)
MONO%: 7.3 % (ref 0.0–14.0)
NEUT%: 64.7 % (ref 39.0–75.0)
NEUTROS ABS: 5.2 10*3/uL (ref 1.5–6.5)
PLATELETS: 320 10*3/uL (ref 140–400)
RBC: 3.81 10*6/uL — AB (ref 4.20–5.82)
RDW: 16.1 % — AB (ref 11.0–14.6)
WBC: 8 10*3/uL (ref 4.0–10.3)
lymph#: 1.6 10*3/uL (ref 0.9–3.3)

## 2015-08-06 MED ORDER — PAZOPANIB HCL 200 MG PO TABS
800.0000 mg | ORAL_TABLET | Freq: Every day | ORAL | Status: DC
Start: 2015-08-06 — End: 2015-11-10

## 2015-08-06 MED ORDER — PROCHLORPERAZINE MALEATE 10 MG PO TABS: 10.0000 mg | ORAL_TABLET | Freq: Four times a day (QID) | ORAL | Status: DC | PRN

## 2015-08-06 NOTE — Patient Instructions (Signed)
Kidney Cancer Kidney cancer is an abnormal growth of cells in the kidney that is cancerous (malignant). Unlike noncancerous (benign) tumors, malignant tumors can spread to other parts of your body. The kidneys are the organs that filter your blood and keep it clean. They move waste out of your blood and into your urine. Urine passes from the kidneys, through the ureters, and into the bladder. When you urinate, these wastes leave the body.  RISK FACTORS There are a number of risk factors that can increase your chances of getting kidney cancer. They include:  Age. The likelihood of developing kidney cancer increases with age.  Family history of kidney cancer.  Being [place] American, Native [place], or Native [place].  Smoking.  Being male.  Obesity.  High blood pressure (hypertension).  Advanced kidney disease, especially requiring long-term kidney dialysis.  Inherited conditions, such as von Hippel-Lindau disease, tuberous sclerosis, and familial papillary renal cell carcinoma.  Exposure to certain chemicals in the workplace. SIGNS AND SYMPTOMS Early kidney cancer often does not cause symptoms. As the cancer grows, symptoms may include:  Blood in the urine.  Abdominal pain or back pain.  Fatigue.  Unexplained weight loss.  Fever. DIAGNOSIS Your health care provider may ask about your medical history and perform a physical exam. Other tests that may be done include:  Blood and urine tests.  X-rays.  Imaging tests, such as CT scans, MRI, and PET scans.  Intravenous pyelogram.  Angiogram.  Examination of a tissue sample (biopsy) from your kidney. If kidney cancer is confirmed, it will be staged to determine its severity and extent. Staging is an assessment of the size of the tumor and if and where the cancer has spread. Cancer stages include:  Stage I. The cancer is found only in the kidney. The tumor may be up to 2 inches (7 cm) wide.  Stage II. The cancer is  found only in the kidney. The tumor is wider than 2 inches (7 cm).  Stage III. The cancer has spread to nearby tissues and possibly to the lymph nodes.  Stage IV. The cancer has spread to the lymph nodes and other parts of the body, such as the bones, brain, liver, or lungs. TREATMENT Treatment for kidney cancer depends on the type and stage of the cancer. Treatment may include one or more of the following:  Surgery. This may include surgery to remove:  Just the tumor (nephron-sparing surgery).  The entire kidney (nephrectomy).  The kidney, some of the surrounding healthy tissue and nearby lymph nodes, as well as the adrenal gland in certain cases (radical nephrectomy).  Radiation therapy. This uses high-energy rays to kill cancer cells.  Cryoablation. This uses a needle that delivers gas into the cancer cells to freeze them.  Radiofrequency ablation. This uses a needle to deliver high-energy radio waves that heat and burn the cancer cells.  Arterial embolization. This uses a tiny tube that is inserted through a groin artery and threaded up toward the kidney tumor. A substance is injected into the tube to block blood flow to the tumor, which causes it to die.  Drug therapy. Drugs may be used to help your body fight the cancer or to stop cancer cells from growing. This may include chemotherapy, biological therapy, and immunological therapy. HOME CARE INSTRUCTIONS  Take medicines, supplements, and herbal remedies only as directed by your health care provider.  Maintain a healthy diet.  Consider joining a support group. This may help you learn to cope with  the stress of having kidney cancer.  Seek advice to help you manage side effects of treatment.  Keep all follow-up appointments as directed by your health care provider. This is important. SEEK MEDICAL CARE IF:  You notice that you bruise or bleed easily.  You notice that you are losing weight without trying.  You have new  or increased fatigue or weakness. SEEK IMMEDIATE MEDICAL CARE IF:  You have blood in your urine.  You have a sudden increase in pain.  You have a fever.  You are short of breath.  You have chest pain.  Your skin or your eyes are yellow.   This information is not intended to replace advice given to you by your health care provider. Make sure you discuss any questions you have with your health care provider.   Document Released: 02/21/2004 Document Revised: 05/03/2014 Document Reviewed: 09/25/2013 Elsevier Interactive Patient Education 2016 [employer] Pazopanib oral tablets What is this medicine? PAZOPANIB (paz OH pa nib) is a biologic drug used to treat kidney cancer and sarcoma. This medicine may be used for other purposes; ask your health care provider or pharmacist if you have questions. What should I tell my health care provider before I take this medicine? They need to know if you have any of these conditions: -bleeding problems -have had recent surgery (within 7 days) or are having surgery -heart disease -high blood pressure -history of stroke -liver disease -protein in your urine -stomach or intestine problems -thyroid problems -an unusual or allergic reaction to pazopanib, other medicines, foods, dyes, or preservatives -pregnant or trying to get pregnant -breast-feeding How should I use this medicine? Take this medicine by mouth with a glass of water. Follow the directions on the prescription label. Do not cut, crush or chew this medicine. Take this medicine on an empty stomach, at least 1 hour before or 2 hours after meals. Do not take with food. Do not take with grapefruit juice. Take your medicine at regular intervals. Do not take it more often than directed. Do not stop taking except on your doctor's advice. A special MedGuide will be given to you by the pharmacist with each prescription and refill. Be sure to read this information carefully each time. Talk to  your pediatrician regarding the use of this medicine in children. Special care may be needed. Overdosage: If you think you have taken too much of this medicine contact a poison control center or emergency room at once. NOTE: This medicine is only for you. Do not share this medicine with others. What if I miss a dose? If you miss a dose, take it as soon as you can. If your next dose is to be taken in less than 12 hours, then do not take the missed dose. Take the next dose at your regular time. Do not take double or extra doses. What may interact with this medicine? Do not take this medication with any of the following medications: -rifampin This medicine may also interact with the following medications: -certain medicines for stomach problems like cimetidine, famotidine, omeprazole, lansoprazole -clarithromycin -dextromethorphan -grapefruit or grapefruit juice -ketoconazole -lapatinib -certain medicines for irregular heart beat like amiodarone, bepridil, dofetilide, encainide, flecainide, propafenone, quinidine -midazolam -paclitaxel -ritonavir This list may not describe all possible interactions. Give your health care provider a list of all the medicines, herbs, non-prescription drugs, or dietary supplements you use. Also tell them if you smoke, drink alcohol, or use illegal drugs. Some items may interact with your medicine. What  should I watch for while using this medicine? Visit your doctor for regular check ups. You will need to have blood work while you are taking this medicine. Call your doctor or health care professional for advice if you get a fever, chills or sore throat, or other symptoms of a cold or flu. Do not treat yourself. This drug decreases your body's ability to fight infections. Try to avoid being around people who are sick. This medicine may increase your risk to bruise or bleed. Call your doctor or health care professional if you notice any unusual bleeding. Do not become  pregnant while taking this medicine or for 2 weeks after stopping it. Women should inform their doctor if they wish to become pregnant or think they might be pregnant. There is a potential for serious side effects to an unborn child. Talk to your health care professional or pharmacist for more information. Do not breast-feed an infant while taking this medicine. This medicine may interfere with the ability to have a child. You should talk with your doctor or health care professional if you are concerned about your fertility. If you are going to have surgery or any other procedures, tell your doctor you are taking this medicine. What side effects may I notice from receiving this medicine? Side effects that you should report to your doctor or health care professional as soon as possible: -abdominal pain -allergic reactions like skin rash, itching or hives, swelling of the face, lips, or tongue -black, tarry stool -breathing problems -changes in vision -chest pain -confusion, trouble speaking or understanding -cough -fast, irregular heartbeat -fever or chills, sore throat -seizures -severe headaches -shortness or breath -sudden numbness or weakness of the face, arm or leg -trouble passing urine or change in the amount of urine -trouble walking, dizziness, loss of balance or coordination -unusual bleeding or bruising -unusually weak or tired -yellowing of the eyes or skin Side effects that usually do not require medical attention (Report these to your doctor or health care professional if they continue or are bothersome.): -change in hair color -loose or watery stool -loss of appetite -nausea, vomiting -unusually weak or tired This list may not describe all possible side effects. Call your doctor for medical advice about side effects. You may report side effects to FDA at 1-800-FDA-1088. Where should I keep my medicine? Keep out of the reach of children. Store at room temperature between  15 and 30 degrees C (59 and 86 degrees F). Throw away any unused medicine after the expiration date. NOTE: This sheet is a summary. It may not cover all possible information. If you have questions about this medicine, talk to your doctor, pharmacist, or health care provider.    2016, Elsevier/Gold Standard. (2014-09-19 22:23:01)

## 2015-08-06 NOTE — Telephone Encounter (Signed)
per pof to sch pt appt-gave pt copy of avs °

## 2015-08-06 NOTE — Progress Notes (Signed)
[facility]  Result Narrative  EXAM: Magnetic resonance imaging, brain, without and with contrast material. 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 OTHER-    COMPARISON: No pertinent study.  TECHNIQUE: Multiplanar, multisequence MR imaging of the brain was performed without and with I.V. contrast.  FINDINGS:  There is a 5 mm x 5 mm enhancing lesion centered within the anterior right temporal lobe with mild surrounding vasogenic edema. T2/FLAIR periventricular and deep subcortical white matter hyperintensities most compatible with chronic ischemic changes. A There is no midline shift or mass lesion. There is no evidence of intracranial hemorrhage or acute infarct.  There are no extra-axial fluid collections present.  No diffusion weighted signal abnormality is identified.  Partial opacification of the ethmoid air cells on the right. The right maxillary sinus demonstrates mucosal thickening. Mastoid air cells are well pneumatized.  IMPRESSION:  5 mm enhancing lesion with surrounding vasogenic edema within the anterior right temporal lobe concerning for metastatic disease. No additional lesions identified.   Status

## 2015-08-06 NOTE — Progress Notes (Signed)
[place] Telephone:([phone]) [phone]   [phone]  CONSULT NOTE  REFERRING PHYSICIAN: Dr. 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  HPI Patrick GUARDIA Sr. is a 70 y.o. male with past medical history significant for multiple medical problems including history of hypertension, dyslipidemia, COPD, coronary artery disease status post CABG, anxiety/depression, sleep apnea, thoracic aortic aneurysm, hyperparathyroidism, peripheral neuropathy, and diabetes mellitus. In September 2013 the patient was found to have enlarging dysfunction left kidney with surrounding stranding suspicious for renal cell carcinoma. He underwent left nephrectomy with left adrenalectomy on 03/06/2012 at [address] under the care of Dr. Frances Furbish and the final pathologic stage was pT3a. Imaging studies at that time showed no evidence of metastatic disease. The patient was followed by observation and routine imaging studies for 18 months. He has been doing fine until 06/16/2015 when he had episodes of coughing productive of mucus with hemoptysis. He was seen by his primary care physician and chest x-ray was performed on 06/16/2015 and it showed left hilar prominence possibly related to prominent central pulmonary vessels. This was followed by CT scan of the chest with contrast on 06/17/2015 and it showed a spiculated 4.3 x 4.2 cm mass at the superior aspect of the left lower lobe. In addition there was 0.8 cm nodule within the left upper lobe and 0.7 cm nodule at the left lower lobe in addition to scattered smaller right sided nodules measuring up to 0.5 cm in size. There was also a multilobulated centrally necrotic mass at the AP window measuring 6.6 x 4.5 cm with adjacent smaller nodules. There was also a central necrotic 4.4 x 2.8 cm mass at the left hilum with central necrosis and 3.4 x 3.0 cm mass at the subcarinal  region with central necrosis and mild surrounding edema. There was also smaller Central" take nodes at the right hilum. The scan also showed aneurysmal dilatation of the ascending thoracic aorta to 5.0 cm in the AP dimension. The patient was seen by Dr. Grayland Ormond at [facility]. A PET scan was performed on 06/24/2015 and it showed 3.6 x 4.3 cm hypermetabolic spiculated left lower lobe mass suspicious for primary bronchogenic neoplasm or less likely metastasis. There was additional scattered bilateral pulmonary nodules measuring up to 0.9 cm, non-FDG avid but below the size to showed for PET sensitivity and still suspicious for metastasis. There was associated hypermetabolic thoracic lymphadenopathy suspicious for nodal metastasis and no evidence of metastatic disease in the abdomen or pelvis. Dr. Grayland Ormond ordered bronchoscopy for evaluation of the lung masses and hemoptysis. The patient had bad experience with the procedure at [facility] which was canceled. He was seen by Dr. Prescott Gum for evaluation of the aneurysmal dilatation of the ascending thoracic aorta. The advise him to continue on observation for now with close monitoring. In the meantime, the patient underwent a video bronchoscopy with endobronchial ultrasound and biopsy of the mediastinal lymph nodes under the care of Dr. Prescott Gum. The fine-needle aspiration of the 4L lymph nodes showed atypical cells but not diagnostic for malignancy. Dr. 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 The patient requested a referral to medical oncology in [place]. Dr. Prescott Gum kindly referred the patient to me  today for evaluation and recommendation regarding treatment of his recently diagnosed metastatic renal cell  carcinoma. When seen today the patient is feeling fine with no specific complaints except for mild shortness breath with exertion as well as mild cough productive of whitish sputum. He denied having any significant chest pain or hemoptysis. He lost around 30 pounds over the last few months. He also complains of occasional dizzy spells and has a history of vertigo. MRI of the brain performed on 07/02/2015 showed 0.5 cm enhancing lesion with surrounding vasogenic edema within the anterior right temporal lobe concerning for metastatic disease. No additional lesions were identified. Family history significant for mother and father with congestive heart failure. Brother died at age 101 with several heart attacks. The patient is married and has 2 sons. He was accompanied today by his wife [name], son [name] and daughter-in-law [name]. The patient is currently retired and used to work in [employer]. He has a history of smoking 1 pack per day for around 40 years and quit 2003 after his cardiac bypass. No history of alcohol or drug abuse.  HPI  Past Medical History  Diagnosis Date  . Hypertension   . Hyperlipidemia   . Anxiety   . Asthma   . COPD (chronic obstructive pulmonary disease) ([place])   . Coronary artery disease   . Depression   . Heart murmur   . Hypothyroidism   . Sleep apnea     CPAP  . AAA (abdominal aortic aneurysm) ([place])   . GERD (gastroesophageal reflux disease)   . Headache   . Chronic kidney disease     Renal Cell Adenocarcinoma  . Chronic kidney disease     Left Nephrectomy  . Hyperparathyroidism ([place])   . Neuropathy ([place])   . Cancer [facility])     Renal Cell- 2013; Lung 2017  . Eczema   . HPTH (hyperparathyroidism) ([place])   . Thoracic aortic aneurysm ([place]) 2017  . Shortness of breath dyspnea   . Diabetes mellitus without complication ([place])     Type II    Past Surgical History  Procedure Laterality Date  . Parathyroidectomy  12/17/09  . Coronary artery bypass graft       x 5  . Back surgery      compressed vertebrae-put in a plate to seprate the spaces  . Fracture surgery      left arm repair  . Knee surgery      left knee repair  . Cardiac catheterization    . Cervical fusion    . Endobronchial ultrasound N/A 06/30/2015    Procedure: ENDOHIAL ULTRASOUND;  Surgeon: Flora Lipps, MD;  Location: [facility] ORS;  Service: Cardiopulmonary;  Laterality: N/A;  . Video bronchoscopy with endobronchial ultrasound N/A 07/09/2015    Procedure: VIDEO BRONCHOSCOPY WITH ENDOBRONCHIAL ULTRASOUND;  Surgeon: Ivin Poot, MD;  Location: [facility] OR;  Service: Thoracic;  Laterality: N/A;    Family History  Problem Relation Age of Onset  . Stroke Mother   . Cardiomyopathy Mother   . Hypertension Mother   . Hypertension Father   . Congestive Heart Failure Father   . Heart disease Brother   . Heart attack Brother   . Hypertension Brother   . Diabetes Brother   . Obesity Brother   . Heart disease Brother   . Sleep apnea Son     Social History Social History  Substance Use Topics  . Smoking status: Former Smoker -- 1.50 packs/day for 35 years    Types:  Cigarettes    Quit date: 12/24/2001  . Smokeless tobacco: Never Used  . Alcohol Use: No    Allergies  Allergen Reactions  . Amoxicillin Rash and Other (See Comments)    Other reaction(s): ANAPHYLAXIS  . Codeine Rash and Other (See Comments)    Other reaction(s): ANAPHYLAXIS  . Penicillins Rash and Other (See Comments)    Other reaction(s): ANAPHYLAXIS  . Ace Inhibitors Cough    Current Outpatient Prescriptions  Medication Sig Dispense Refill  . albuterol (PROVENTIL HFA;VENTOLIN HFA) 108 (90 BASE) MCG/ACT inhaler Inhale 1 puff into the lungs every 6 (six) hours as needed for wheezing.     Marland Kitchen ALPRAZolam (XANAX) 0.5 MG tablet TAKE ONE TABLET BY MOUTH TWICE DAILY AS NEEDED (Patient taking differently: TAKE 0.5 MG BY MOUTH TWICE DAILY AS NEEDED FOR ANXIETY) 60 tablet 4  . aspirin 81 MG tablet Take 81 mg by mouth daily.  Reported on 07/01/2015    . carvedilol (COREG) 12.5 MG tablet TAKE ONE TABLET BY MOUTH TWICE DAILY (Patient taking differently: TAKE 12.5 MG BY MOUTH TWICE DAILY) 60 tablet 12  . Cholecalciferol 1000 UNITS tablet Take 1,000 Units by mouth daily.     Marland Kitchen FLUoxetine (PROZAC) 40 MG capsule TAKE ONE CAPSULE BY MOUTH EVERY DAY (Patient taking differently: TAKE 40 MG BY MOUTH EVERY DAY) 30 capsule 12  . Fluticasone-Salmeterol (ADVAIR DISKUS) 100-50 MCG/DOSE AEPB Inhale 1 puff into the lungs as needed (for wheezing).     Marland Kitchen losartan (COZAAR) 100 MG tablet TAKE ONE TABLET BY MOUTH EVERY DAY (Patient taking differently: TAKE 100 MG BY MOUTH EVERY DAY) 30 tablet 12  . meclizine (ANTIVERT) 25 MG tablet Take 25 mg by mouth as needed for dizziness.     . metFORMIN (GLUCOPHAGE) 1000 MG tablet Take 1,000 mg by mouth 2 (two) times daily with a meal. Reported on 07/01/2015    . omeprazole (PRILOSEC) 20 MG capsule Take 20 mg by mouth daily.     . pioglitazone (ACTOS) 45 MG tablet TAKE ONE TABLET BY MOUTH EACH DAY. (Patient taking differently: TAKE 45 MG BY MOUTH EACH DAY.) 30 tablet 12  . simvastatin (ZOCOR) 40 MG tablet Take 40 mg by mouth daily at 6 PM.     . pazopanib (VOTRIENT) 200 MG tablet Take 4 tablets (800 mg total) by mouth daily. Take on an empty stomach. 120 tablet 2   No current facility-administered medications for this visit.    Review of Systems  Constitutional: positive for fatigue and weight loss Eyes: negative Ears, nose, mouth, throat, and face: negative Respiratory: positive for cough, dyspnea on exertion and sputum Cardiovascular: negative Gastrointestinal: negative Genitourinary:negative Integument/breast: negative Hematologic/lymphatic: negative Musculoskeletal:negative Neurological: negative Behavioral/Psych: negative Endocrine: negative Allergic/Immunologic: negative  Physical Exam  XBM:WUXLK, healthy, no distress, well nourished and well developed SKIN: skin color, texture,  turgor are normal, no rashes or significant lesions HEAD: Normocephalic, No masses, lesions, tenderness or abnormalities EYES: normal, PERRLA, Conjunctiva are pink and non-injected EARS: External ears normal, Canals clear OROPHARYNX:no exudate, no erythema and lips, buccal mucosa, and tongue normal  NECK: supple, no adenopathy, no JVD LYMPH:  no palpable lymphadenopathy, no hepatosplenomegaly LUNGS: clear to auscultation , and palpation HEART: regular rate & rhythm, no murmurs and no gallops ABDOMEN:abdomen soft, non-tender, normal bowel sounds and no masses or organomegaly BACK: Back symmetric, no curvature., No CVA tenderness EXTREMITIES:no joint deformities, effusion, or inflammation, no edema, no skin discoloration  NEURO: alert & oriented x 3 with fluent speech, no focal motor/sensory  deficits  PERFORMANCE STATUS: ECOG 1  LABORATORY DATA: Lab Results  Component Value Date   WBC 8.0 08/06/2015   HGB 9.5* 08/06/2015   HCT 30.6* 08/06/2015   MCV 80.3 08/06/2015   PLT 320 08/06/2015      Chemistry      Component Value Date/Time   NA 140 08/06/2015 1400   NA 141 07/09/2015 1435   NA 138 11/21/2013   NA 140 01/19/2012 0834   K 4.5 08/06/2015 1400   K 4.4 07/09/2015 1435   K 4.0 01/19/2012 0834   CL 104 07/09/2015 1435   CL 103 01/19/2012 0834   CO2 28 08/06/2015 1400   CO2 26 07/09/2015 1435   CO2 28 01/19/2012 0834   BUN 14.8 08/06/2015 1400   BUN 13 07/09/2015 1435   BUN 17 11/21/2013   BUN 12 01/19/2012 0834   CREATININE 1.0 08/06/2015 1400   CREATININE 1.04 07/09/2015 1435   CREATININE 1.1 11/21/2013   CREATININE 0.84 01/19/2012 0834   GLU 159 11/21/2013      Component Value Date/Time   CALCIUM 9.5 08/06/2015 1400   CALCIUM 9.3 07/09/2015 1435   CALCIUM 9.1 01/19/2012 0834   ALKPHOS 77 08/06/2015 1400   ALKPHOS 74 07/09/2015 1435   AST 10 08/06/2015 1400   AST 14* 07/09/2015 1435   ALT <9 08/06/2015 1400   ALT 10* 07/09/2015 1435   BILITOT 0.53  08/06/2015 1400   BILITOT 0.6 07/09/2015 1435       RADIOGRAPHIC STUDIES: Dg Chest 2 View  07/09/2015  CLINICAL DATA:  Preoperative evaluation for upcoming bronchoscopy EXAM: CHEST  2 VIEW COMPARISON:  06/24/2015 FINDINGS: Cardiac shadow is stable. The known left lower lobe mass is again seen with fullness in the left hilar region consistent with the known lymphadenopathy. No focal confluent infiltrate is seen. Postoperative changes are noted. IMPRESSION: Changes consistent with the known left lower lobe mass and hilar and mediastinal adenopathy. No acute abnormality is noted Electronically Signed   By: Inez Catalina M.D.   On: 07/09/2015 14:37   Ct Biopsy  07/23/2015  INDICATION: 70 year old male with left lower lobe pulmonary mass and necrotic mediastinal adenopathy. He underwent prior endobronchial biopsy which was negative. He presents for CT-guided biopsy of the left lower lobe mass in an effort to attain tissue diagnosis. EXAM: CT-guided biopsy left lower lobe pulmonary nodule Interventional Radiologist:  Criselda Peaches, MD MEDICATIONS: None. ANESTHESIA/SEDATION: Fentanyl 75 mcg IV; Versed to mg IV Moderate Sedation Time:  17 The patient was continuously monitored during the procedure by the interventional radiology nurse under my direct supervision. FLUOROSCOPY TIME:  None COMPLICATIONS: None immediate. Estimated blood loss:  0 PROCEDURE: Informed written consent was obtained from the patient after a thorough discussion of the procedural risks, benefits and alternatives. All questions were addressed. Maximal Sterile Barrier Technique was utilized including caps, mask, sterile gowns, sterile gloves, sterile drape, hand hygiene and skin antiseptic. A timeout was performed prior to the initiation of the procedure. A planning axial CT scan was performed. The nodule in the left lower lobe was successfully identified. A suitable skin entry site was selected and marked. The region was then sterilely  prepped and draped in standard fashion using Betadine skin prep. Local anesthesia was attained by infiltration with 1% lidocaine. A small dermatotomy was made. Under intermittent CT fluoroscopic guidance, a 17 gauge trocar needle was advanced into the lung and positioned at the margin of the nodule. Multiple 18 gauge core biopsies were then coaxially  obtained using the BioPince automated biopsy device. Biopsy specimens were placed in formalin and delivered to pathology for further analysis. The biopsy device was removed. A BioSentry device was applied as the introducer needle was removed. Post biopsy axial CT imaging demonstrates no evidence of immediate complication. There is no pneumothorax. Mild perilesional alveolar hemorrhage is not unexpected. The patient tolerated the procedure well. IMPRESSION: Technically successful CT-guided biopsy left lower lobe pulmonary nodule. Signed, Criselda Peaches, MD [facility] Electronically Signed   By: Jacqulynn Cadet M.D.   On: 07/23/2015 10:11   Dg Chest Port 1 View  07/22/2015  CLINICAL DATA:  Status post left lower lobe biopsy EXAM: PORTABLE CHEST 1 VIEW COMPARISON:  PET-CT June 24, 2015; chest radiograph July 09 2015 FINDINGS: There is no demonstrable pneumothorax. Irregular opacity in the left base medially persists. Elsewhere lungs appear somewhat hyperexpanded. No new opacity evident. Heart is prominent with pulmonary vascularity within normal limits. No adenopathy. Patient is status post internal mammary bypass grafting. There is postoperative change in the lower cervical spine region. IMPRESSION: No demonstrable pneumothorax. Ill-defined irregular opacity medial left base persists. No new opacity evident. Stable cardiac prominence. Electronically Signed   By: Lowella Grip III M.D.   On: 07/22/2015 10:49   Dg Chest Port 1 View  07/09/2015  CLINICAL DATA:  Status post bronchoscopy EXAM:  PORTABLE CHEST 1 VIEW COMPARISON:  07/09/2015 FINDINGS: Fullness is again noted in the left hilar region. No pneumothorax is seen. The lungs are well aerated. Postsurgical changes are again seen. IMPRESSION: No pneumothorax following bronchoscopy. Electronically Signed   By: Inez Catalina M.D.   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 This was initially diagnosed as a stage III status post left nephrectomy as well as left adrenalectomy on 03/06/2012. 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  PLAN: I had a lengthy discussion with the patient and his family today about his current disease stage, prognosis and treatment options. I will refer the patient to radiation oncology for consideration of stereotactic radiotherapy to the solitary brain metastasis. I also discussed with the patient several options for treatment of his condition. The patient understands that he has incurable condition and on the treatment will be of palliative nature. I discussed with him the prognosis with and without treatment. He was given the option of doing nothing and continue with palliative care versus consideration of treatment with anti-angiogenic agent like Votrient or Sutent. He is interested in treatment and I recommended for the patient treatment with Votrient 800 mg by mouth daily. I discussed with the patient adverse effect of this treatment and give him a handout about the medication and its adverse effects. I also discussed with the patient arranging his follow-up visit and treatment under the care of Dr. Grayland Ormond at the [facility] but the patient requested continue his care here in [place]. I would see him  back for follow-up visit in 2 weeks for reevaluation with repeat blood work for close monitoring of his treatment. I will arrange for the patient to have a chemotherapy education class within the next few days. I will call his pharmacy with prescription for Compazine 10 mg by mouth every  6 hours as needed for nausea. For the ascending thoracic aortic aneurysm, the patient will continue follow-up visit and evaluation by Dr. Prescott Gum. I gave the patient and his family the time to ask questions and I answered them completely to their satisfaction. The patient voices understanding of current disease status and treatment options and is in agreement with the current care plan.  All questions were answered. The patient knows to call the clinic with any problems, questions or concerns. We can certainly see the patient much sooner if necessary.  Thank you so much for allowing me to participate in the care of Patrick LEAMING Sr.. I will continue to follow up the patient with you and assist in his care.  I spent 55 minutes counseling the patient face to face. The total time spent in the appointment was 80 minutes.  Disclaimer: This note was dictated with voice recognition software. Similar sounding words can inadvertently be transcribed and may not be corrected upon review.   Khamron Gellert K. [name] 12, 2017, 3:33 PM

## 2015-08-07 ENCOUNTER — Other Ambulatory Visit: Payer: Medicare Other

## 2015-08-07 ENCOUNTER — Encounter: Payer: Self-pay | Admitting: Internal Medicine

## 2015-08-07 ENCOUNTER — Telehealth: Payer: Self-pay | Admitting: Pharmacist

## 2015-08-07 MED FILL — *VOTRIENT 200 MG TABLET: 200 | 30 days supply | Qty: 120 | Fill #0

## 2015-08-07 NOTE — Telephone Encounter (Signed)
08/07/15: New Rx for Votrient sent to [facility] outpatient pharmacy yesterday on 4/12. Prior Auth approved today and patient signed up for PANF Renal Cell grant for $11,000. Pt will have chemo education at [facility] on 4/14.   PANF card info:  ID: [id number] RxGroup: [id number] RxBin: [id number]  Thank you,  Montel Clock, PharmD, [facility]

## 2015-08-07 NOTE — Progress Notes (Signed)
Per [employer] votrient approved 08/06/15-08/05/16.

## 2015-08-08 ENCOUNTER — Other Ambulatory Visit: Payer: Self-pay | Admitting: Family Medicine

## 2015-08-08 ENCOUNTER — Other Ambulatory Visit: Payer: Medicare Other

## 2015-08-08 ENCOUNTER — Encounter: Payer: Self-pay | Admitting: Pharmacist

## 2015-08-08 NOTE — Progress Notes (Signed)
Oral Chemotherapy Pharmacist Encounter  I spoke with patient for overview of new oral chemotherapy medication: Votrient (pazopanib). Pt is doing well. The prescription is ready for pick up at [facility]. He plans to pick it up today and start his first dose this evening April 14.  Counseled patient on administration, dosing, side effects, safe handling, and monitoring. Side effects include but not limited to: hypertension, fatigue, N/V/D, headache, hair/skin, hand foot syndrome, increased liver enzymes.  Mr. [name] voiced understanding and appreciation.   All questions answered.  Will follow up in 1 week for adherence and toxicity management.   Thank you,  Skip Mayer, PharmD, BCPS [facility]

## 2015-08-11 ENCOUNTER — Other Ambulatory Visit: Payer: Self-pay | Admitting: Radiation Therapy

## 2015-08-11 ENCOUNTER — Other Ambulatory Visit: Payer: Self-pay | Admitting: Family Medicine

## 2015-08-11 DIAGNOSIS — C7931 Secondary malignant neoplasm of brain: Secondary | ICD-10-CM

## 2015-08-14 ENCOUNTER — Encounter: Payer: Self-pay | Admitting: Internal Medicine

## 2015-08-14 ENCOUNTER — Telehealth: Payer: Self-pay | Admitting: Medical Oncology

## 2015-08-14 NOTE — Telephone Encounter (Signed)
Votrient approved.

## 2015-08-14 NOTE — Progress Notes (Signed)
Location/Histology of Brain Tumor: The Brain tumor has not been biopsied. 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  Patient presented with symptoms related to his Lung Metastasis. Per Dr. Earlie Server, he had been doing fine until 06/16/2015 when he had episodes of coughing productive of mucus with hemoptysis. He was seen by his primary care physician and chest x-ray was performed on 06/16/2015 and it showed left hilar prominence possibly related to prominent central pulmonary vessels.   Past or anticipated interventions, if any, per neurosurgery: None  Past or anticipated interventions, if any, per medical oncology:  He saw Dr. Earlie Server 08/06/15  PLAN:  1. I will refer the patient to radiation oncology for consideration of stereotactic radiotherapy to the solitary brain metastasis. 2. I also discussed with the patient several options for treatment of his condition. The patient understands that he has incurable condition and  the treatment will be of palliative nature. 3. I discussed with him the prognosis with and without treatment. He was given the option of doing nothing and continue with palliative care versus consideration of treatment with anti-angiogenic agent like Votrient or Sutent. He is interested in treatment and I recommended for the patient treatment with Votrient 800 mg by mouth daily.  I also discussed with the patient arranging his follow-up visit and treatment under the care of Dr. Grayland Ormond at the [facility] but the patient requested to continue his care here in [place]. I would see him back for follow-up visit in 2 weeks for reevaluation with repeat blood work for close monitoring of his treatment.  Dose of Decadron, if  applicable: None  Recent neurologic symptoms, if any:   Seizures: No  Headaches: Yes, he reports increased headaches over the past several days in his posterior neck, and frontal area.   Nausea: Yes, some first thing in the morning, but it improves quickly.   Dizziness/ataxia: No, he does have some vertigo at times.   Difficulty with hand coordination: No  Focal numbness/weakness: Yes, he does have some weakness, but relates this to the oral chemotherapy pill he is taking  Visual deficits/changes: No  Confusion/Memory deficits: No  Painful bone metastases at present, if any: None  SAFETY ISSUES:  Prior radiation? No  Pacemaker/ICD? No  Possible current pregnancy? N/A  Is the patient on methotrexate? No  Additional Complaints / other details:  In September 2013 the patient was found to have enlarging dysfunction left kidney with surrounding stranding suspicious for renal cell carcinoma. He underwent left nephrectomy with left adrenalectomy on 03/06/2012 at [facility] under the care of Dr. Frances Furbish and the final pathologic stage was pT3a. Imaging studies at that time showed no evidence of metastatic disease. The patient was followed by observation and routine imaging studies for 18 months.  06/24/15 PET IMPRESSION: Status post left nephrectomy.  3.6 x 4.3 cm hypermetabolic spiculated left lower lobe mass, suspicious for primary bronchogenic neoplasm or less likely metastasis.  Additional scattered bilateral pulmonary nodules measuring up to 9 mm, non FDG avid but beneath the size threshold for PET sensitivity, suspicious for metastases.  Associated hypermetabolic thoracic lymphadenopathy, suspicious for nodal metastases.  No evidence of metastatic disease in the abdomen/ pelvis  BP 182/91 mmHg  Pulse 77  Temp(Src) 97.7 F (36.5 C)  Ht '5\' 11"'$  (1.803 m)  Wt 273 lb 3.2 oz (123.923 kg)  BMI 38.12 kg/m2  SpO2 96%

## 2015-08-15 ENCOUNTER — Ambulatory Visit
Admission: RE | Admit: 2015-08-15 | Discharge: 2015-08-15 | Disposition: A | Discharge status: Home / Self Care | Payer: Medicare Other | Source: Ambulatory Visit | Attending: Radiation Oncology | Admitting: Radiation Oncology

## 2015-08-15 ENCOUNTER — Inpatient Hospital Stay
Admission: RE | Admit: 2015-08-15 | Discharge: 2015-08-15 | Disposition: A | Discharge status: Home / Self Care | Payer: Self-pay | Source: Ambulatory Visit | Attending: Radiation Oncology | Admitting: Radiation Oncology

## 2015-08-15 ENCOUNTER — Other Ambulatory Visit: Payer: Self-pay | Admitting: Radiation Oncology

## 2015-08-15 ENCOUNTER — Telehealth: Payer: Self-pay | Admitting: Pharmacist

## 2015-08-15 ENCOUNTER — Telehealth: Payer: Self-pay

## 2015-08-15 DIAGNOSIS — C7931 Secondary malignant neoplasm of brain: Secondary | ICD-10-CM

## 2015-08-15 MED ORDER — GADOBENATE DIMEGLUMINE 529 MG/ML IV SOLN
20.0000 mL | Freq: Once | INTRAVENOUS | Status: AC | PRN
  Administered 2015-08-15: 20 mL via INTRAVENOUS

## 2015-08-15 NOTE — Telephone Encounter (Signed)
08/15/15: Attempted to reach patient for follow up on oral medication: Votrient. No answer. Left VM for patient to call back with any questions or issues.   Thank you,  Montel Clock, PharmD, [facility] [phone]

## 2015-08-15 NOTE — Telephone Encounter (Signed)
I called [name] to remind him not to take his Metformin on Monday morning before he comes in for his IV start and CT Simulation. I left my phone number and asked him to call if he has any questions.

## 2015-08-18 ENCOUNTER — Ambulatory Visit
Admission: RE | Admit: 2015-08-18 | Discharge: 2015-08-18 | Disposition: A | Discharge status: Home / Self Care | Payer: Medicare Other | Source: Ambulatory Visit | Attending: Radiation Oncology | Admitting: Radiation Oncology

## 2015-08-18 ENCOUNTER — Encounter: Payer: Self-pay | Admitting: Radiation Oncology

## 2015-08-18 VITALS — BP 182/91 | HR 77 | Temp 97.7°F | Ht 71.0 in | Wt 273.2 lb

## 2015-08-18 DIAGNOSIS — E039 Hypothyroidism, unspecified: Secondary | ICD-10-CM | POA: Diagnosis not present

## 2015-08-18 DIAGNOSIS — I714 Abdominal aortic aneurysm, without rupture: Secondary | ICD-10-CM | POA: Insufficient documentation

## 2015-08-18 DIAGNOSIS — C7931 Secondary malignant neoplasm of brain: Secondary | ICD-10-CM

## 2015-08-18 DIAGNOSIS — G893 Neoplasm related pain (acute) (chronic): Secondary | ICD-10-CM | POA: Diagnosis not present

## 2015-08-18 DIAGNOSIS — G629 Polyneuropathy, unspecified: Secondary | ICD-10-CM | POA: Insufficient documentation

## 2015-08-18 DIAGNOSIS — E1122 Type 2 diabetes mellitus with diabetic chronic kidney disease: Secondary | ICD-10-CM | POA: Diagnosis not present

## 2015-08-18 DIAGNOSIS — E896 Postprocedural adrenocortical (-medullary) hypofunction: Secondary | ICD-10-CM | POA: Insufficient documentation

## 2015-08-18 DIAGNOSIS — I712 Thoracic aortic aneurysm, without rupture: Secondary | ICD-10-CM | POA: Insufficient documentation

## 2015-08-18 DIAGNOSIS — E785 Hyperlipidemia, unspecified: Secondary | ICD-10-CM | POA: Insufficient documentation

## 2015-08-18 DIAGNOSIS — I129 Hypertensive chronic kidney disease with stage 1 through stage 4 chronic kidney disease, or unspecified chronic kidney disease: Secondary | ICD-10-CM | POA: Insufficient documentation

## 2015-08-18 DIAGNOSIS — N189 Chronic kidney disease, unspecified: Secondary | ICD-10-CM | POA: Insufficient documentation

## 2015-08-18 DIAGNOSIS — Z951 Presence of aortocoronary bypass graft: Secondary | ICD-10-CM | POA: Insufficient documentation

## 2015-08-18 DIAGNOSIS — R0602 Shortness of breath: Secondary | ICD-10-CM | POA: Insufficient documentation

## 2015-08-18 DIAGNOSIS — C642 Malignant neoplasm of left kidney, except renal pelvis: Secondary | ICD-10-CM | POA: Insufficient documentation

## 2015-08-18 DIAGNOSIS — Z510 Encounter for antineoplastic radiation therapy: Secondary | ICD-10-CM | POA: Diagnosis present

## 2015-08-18 DIAGNOSIS — I2510 Atherosclerotic heart disease of native coronary artery without angina pectoris: Secondary | ICD-10-CM | POA: Diagnosis not present

## 2015-08-18 DIAGNOSIS — K219 Gastro-esophageal reflux disease without esophagitis: Secondary | ICD-10-CM | POA: Insufficient documentation

## 2015-08-18 DIAGNOSIS — J449 Chronic obstructive pulmonary disease, unspecified: Secondary | ICD-10-CM | POA: Insufficient documentation

## 2015-08-18 DIAGNOSIS — Z7982 Long term (current) use of aspirin: Secondary | ICD-10-CM | POA: Diagnosis not present

## 2015-08-18 DIAGNOSIS — R011 Cardiac murmur, unspecified: Secondary | ICD-10-CM | POA: Insufficient documentation

## 2015-08-18 DIAGNOSIS — Z7984 Long term (current) use of oral hypoglycemic drugs: Secondary | ICD-10-CM | POA: Insufficient documentation

## 2015-08-18 DIAGNOSIS — Z905 Acquired absence of kidney: Secondary | ICD-10-CM | POA: Diagnosis not present

## 2015-08-18 DIAGNOSIS — E213 Hyperparathyroidism, unspecified: Secondary | ICD-10-CM | POA: Insufficient documentation

## 2015-08-18 DIAGNOSIS — F419 Anxiety disorder, unspecified: Secondary | ICD-10-CM | POA: Insufficient documentation

## 2015-08-18 MED ORDER — SODIUM CHLORIDE 0.9% FLUSH
10.0000 mL | Freq: Once | INTRAVENOUS | Status: AC
  Administered 2015-08-18: 10 mL via INTRAVENOUS

## 2015-08-18 NOTE — Progress Notes (Signed)
  Name: Patrick SANTAANA Sr. MRN: [id number]  Date: 08/18/2015  DOB: 09-Sep-1945  SIMULATION AND TREATMENT PLANNING NOTE    ICD-9-CM ICD-10-CM   1. 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 MRN: [id number]  Date: 08/18/2015  DOB: Nov 06, 1945  SIMULATION AND TREATMENT PLANNING NOTE    ICD-9-CM ICD-10-CM   1. 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  Identity was confirmed.  All relevant records and images related to the planned course of therapy were reviewed.  The patient freely provided informed written consent to proceed with treatment after reviewing the details related to the planned course of therapy. The consent form was witnessed and verified by the simulation staff. Intravenous access was established for contrast administration. Then, the patient was set-up in a stable reproducible supine position for radiation therapy.  A relocatable thermoplastic stereotactic head frame was fabricated for precise immobilization.  CT images were obtained.  Surface markings were placed.  The CT images were loaded into the planning software and fused with the patient's targeting MRI scan.  Then the target and avoidance structures were contoured.  Treatment planning then occurred.  The radiation prescription was entered and confirmed.  I have requested 3D planning  I have requested a DVH of the following structures: Brain stem, brain, left eye, right eye, lenses, optic chiasm, target volumes, uninvolved brain, and normal tissue.    SPECIAL TREATMENT PROCEDURE:  The planned course of therapy using radiation constitutes a special treatment procedure. Special care is required in the management of this patient for the following reasons:  High dose per fraction  requiring special monitoring for increased toxicities of treatment including daily imaging.  The special nature of the planned course of radiotherapy will require increased physician supervision and oversight to ensure patient's safety with optimal treatment outcomes.  PLAN:  The patient will receive 20 Gy in 1 fraction, stereotactic radiosurgery, to the right temporal brain metastasis.  ________________________________    Eppie Gibson, MD

## 2015-08-18 NOTE — Progress Notes (Signed)
Radiation Oncology         [phone]) [phone] ________________________________  Initial outpatient Consultation  Name: Patrick Breed Sr. 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 is a 70 y.o. male [place] Veteran who in  2013  underwent left nephrectomy with left adrenalectomy at [facility] by Dr. Frances Furbish revealed renal cell carcinoma, pathologic stage was pT3a. Imaging studies at that time showed no evidence of metastatic disease. The patient was followed by observation and routine imaging studies.  On 06/16/2015 he presented with coughing productive of mucus with hemoptysis. Chest x-ray was performed on 06/16/2015 showing left hilar prominence - CT scan of the chest with contrast on 06/17/2015  showed a spiculated 4.3 x 4.2 cm mass at the superior aspect of the left lower lobe with malignant appearing chest adenopathy and scattered small lung nodules.  A PET scan was performed on 06/24/2015 and it showed 3.6 x 4.3 cm hypermetabolic spiculated left lower lobe mass suspicious for primary bronchogenic neoplasm vs  metastasis. The scattered bilateral pulmonary nodules were non-FDG avid but below the size to showed for PET sensitivity and still suspicious for metastases. He demonstrated hypermetabolic thoracic lymphadenopathy but no evidence of metastatic disease in the abdomen or pelvis  He was seen by Dr. Prescott Gum for evaluation of an aneurysmal dilatation of the ascending thoracic aorta. He recommended to continue on observation with close monitoring.  Bronchoscopy with endobronchial ultrasound and biopsy of the mediastinal lymph nodes under the care of Dr. Prescott Gum. This was no  diagnostic for malignancy.  CT guided core biopsy of the left lower lobe pulmonary nodule was performed by interventional radiology on 07/22/2015 and the final pathology (Accession: [id number]) showed clear cell carcinoma consistent with metastatic renal cell carcinoma. He is interested in systemic treatment and Dr. Earlie Server recommended for the patient treatment with Votrient 800 mg by mouth daily.  MRI of the brain performed on 07/02/2015 showed a sub centimeter enhancing lesion in the anterior right temporal lobe concerning for metastatic disease. No additional lesions were identified.  MRI of the brain on 08-15-15 demonstrated a solitary, relatively stable, small 7 mm right temporal lobe subependymal or periventricular enhancing lesion is stable since 07/02/2015 and without associated edema or mass effect. Could not rule out a small primary choroid plexus tumor rather than a metastasis.   Dose of Decadron, if applicable: None  Recent neurologic symptoms, if any:   Seizures: No  Headaches: Yes, he reports increased headaches over the past several days in his posterior neck, and frontal area.  History of titanium plates in neck by Dr Arnoldo Morale.  Nausea: Yes, some first thing in the morning, but it improves quickly.   Dizziness/ataxia: No, he does have some vertigo at times.   Difficulty with hand coordination: No  Focal numbness/weakness: Yes, he does have some weakness, but relates this to the oral chemotherapy pill he is taking  Visual deficits/changes: No  Confusion/Memory deficits: No  Painful bone metastases at present, if any: None  SAFETY ISSUES:  Prior radiation? No  Pacemaker/ICD? No  Possible current pregnancy? N/A  Is the patient on methotrexate? No    PREVIOUS RADIATION THERAPY: No  PAST MEDICAL HISTORY:  has a past medical history of Hypertension; Hyperlipidemia; Anxiety; Asthma; COPD (chronic obstructive pulmonary disease) ([place]); Coronary artery disease;  Depression; Heart murmur; Hypothyroidism; Sleep apnea; AAA (abdominal aortic aneurysm) ([place]); GERD (gastroesophageal reflux disease); Headache; Chronic kidney disease; Chronic kidney disease; Hyperparathyroidism ([place]); Neuropathy ([place]); Cancer ([place]); Eczema; HPTH (hyperparathyroidism) ([place]); Thoracic aortic aneurysm ([place]) (2017); Shortness of breath dyspnea; and Diabetes mellitus without complication ([place]).    PAST SURGICAL HISTORY: Past Surgical History  Procedure Laterality Date  . Parathyroidectomy  12/17/09  . Coronary artery bypass graft      x 5  . Back surgery      compressed vertebrae-put in a plate to seprate the spaces  . Fracture surgery      left arm repair  . Knee surgery      left knee repair  . Cardiac catheterization    . Cervical fusion    . Endobronchial ultrasound N/A 06/30/2015    Procedure: ENDOHIAL ULTRASOUND;  Surgeon: Flora Lipps, MD;  Location: [facility];  Service: Cardiopulmonary;  Laterality: N/A;  . Video bronchoscopy with endobronchial ultrasound N/A 07/09/2015    Procedure: VIDEO BRONCHOSCOPY WITH ENDOBRONCHIAL ULTRASOUND;  Surgeon: Ivin Poot, MD;  Location: [place];  Service: Thoracic;  Laterality: N/A;    FAMILY HISTORY: family history includes Cardiomyopathy in his mother; Congestive Heart Failure in his father; Diabetes in his brother; Heart attack in his brother; Heart disease in his brother and brother; Hypertension in his brother, father, and mother; Obesity in his brother; Sleep apnea in his son; Stroke in his mother.  SOCIAL HISTORY:  reports that he quit smoking about 13 years ago. His smoking use included Cigarettes. He has a 52.5 pack-year smoking history. He has never used smokeless tobacco. He reports that he does not drink alcohol or use illicit drugs.  ALLERGIES: Amoxicillin; Codeine; Penicillins; and Ace inhibitors  MEDICATIONS:  Current Outpatient Prescriptions  Medication Sig Dispense Refill  . ADVAIR DISKUS 100-50 MCG/DOSE AEPB INHALE 1  PUFF TWICE A DAY 60 each 12  . albuterol (PROVENTIL HFA;VENTOLIN HFA) 108 (90 BASE) MCG/ACT inhaler Inhale 1 puff into the lungs every 6 (six) hours as needed for wheezing.     Marland Kitchen ALPRAZolam (XANAX) 0.5 MG tablet TAKE ONE TABLET BY MOUTH TWICE DAILY AS NEEDED (Patient taking differently: TAKE 0.5 MG BY MOUTH TWICE DAILY AS NEEDED FOR ANXIETY) 60 tablet 4  . aspirin 81 MG tablet Take 81 mg by mouth daily. Reported on 07/01/2015    . carvedilol (COREG) 12.5 MG tablet TAKE ONE TABLET BY MOUTH TWICE DAILY (Patient taking differently: TAKE 12.5 MG BY MOUTH TWICE DAILY) 60 tablet 12  . Cholecalciferol 1000 UNITS tablet Take 1,000 Units by mouth daily.     Marland Kitchen FLUoxetine (PROZAC) 40 MG capsule TAKE ONE CAPSULE BY MOUTH EVERY DAY (Patient taking differently: TAKE 40 MG BY MOUTH EVERY DAY) 30 capsule 12  . losartan (COZAAR) 100 MG tablet TAKE ONE TABLET BY MOUTH EVERY DAY (Patient taking differently: TAKE 100 MG BY MOUTH EVERY DAY) 30 tablet 12  . meclizine (ANTIVERT) 25 MG tablet Take 25 mg by mouth as needed for dizziness.     . metFORMIN (GLUCOPHAGE) 1000 MG tablet Take 1,000 mg by mouth 2 (two) times daily with a meal. Reported on 07/01/2015    . omeprazole (PRILOSEC) 20 MG capsule Take 20 mg by mouth daily.     . pazopanib (VOTRIENT) 200 MG tablet Take 4 tablets (800 mg total) by mouth daily. Take  on an empty stomach. 120 tablet 2  . pioglitazone (ACTOS) 45 MG tablet TAKE ONE TABLET BY MOUTH EACH DAY. (Patient taking differently: TAKE 45 MG BY MOUTH EACH DAY.) 30 tablet 12  . simvastatin (ZOCOR) 40 MG tablet TAKE ONE TABLET BY MOUTH EVERY DAY 30 tablet 12  . prochlorperazine (COMPAZINE) 10 MG tablet Take 1 tablet (10 mg total) by mouth every 6 (six) hours as needed for nausea or vomiting. (Patient not taking: Reported on 08/18/2015) 30 tablet 0   No current facility-administered medications for this encounter.    REVIEW OF SYSTEMS:  As above   PHYSICAL EXAM:  height is '5\' 11"'$  (1.803 m) and weight is 273 lb  3.2 oz (123.923 kg). His temperature is 97.7 F (36.5 C). His blood pressure is 182/91 and his pulse is 77. His oxygen saturation is 96%.   General: Alert and oriented, in no acute distress HEENT: Head is normocephalic. Extraocular movements are intact. Oropharynx is clear. Neck: Neck is supple, no palpable cervical or supraclavicular lymphadenopathy. Heart: Audible murmer and click in aortic region.  Occasionally irregular rhythm, regular rate Chest: Clear to auscultation bilaterally, with no rhonchi, wheezes, or rales. Abdomen: Soft, nontender, nondistended, with no rigidity or guarding. Extremities: No cyanosis or edema. Lymphatics: see Neck Exam Skin: no obvious skin cancers on exposed skin Musculoskeletal: symmetric strength and muscle tone throughout. Neurologic: Cranial nerves II through XII are grossly intact. Mild horizontal nystagmus. No obvious focalities. Speech is fluent. Coordination (finger to nose) is intact. RAM's intact. Psychiatric: Judgment and insight are intact. Affect is appropriate.     LABORATORY DATA:  Lab Results  Component Value Date   WBC 8.0 08/06/2015   HGB 9.5* 08/06/2015   HCT 30.6* 08/06/2015   MCV 80.3 08/06/2015   PLT 320 08/06/2015   CMP     Component Value Date/Time   NA 140 08/06/2015 1400   NA 141 07/09/2015 1435   NA 138 11/21/2013   NA 140 01/19/2012 0834   K 4.5 08/06/2015 1400   K 4.4 07/09/2015 1435   K 4.0 01/19/2012 0834   CL 104 07/09/2015 1435   CL 103 01/19/2012 0834   CO2 28 08/06/2015 1400   CO2 26 07/09/2015 1435   CO2 28 01/19/2012 0834   GLUCOSE 116 08/06/2015 1400   GLUCOSE 144* 07/09/2015 1435   GLUCOSE 166* 01/19/2012 0834   BUN 14.8 08/06/2015 1400   BUN 13 07/09/2015 1435   BUN 17 11/21/2013   BUN 12 01/19/2012 0834   CREATININE 1.0 08/06/2015 1400   CREATININE 1.04 07/09/2015 1435   CREATININE 1.1 11/21/2013   CREATININE 0.84 01/19/2012 0834   CALCIUM 9.5 08/06/2015 1400   CALCIUM 9.3 07/09/2015 1435    CALCIUM 9.1 01/19/2012 0834   PROT 7.0 08/06/2015 1400   PROT 7.2 07/09/2015 1435   ALBUMIN 3.3* 08/06/2015 1400   ALBUMIN 3.4* 07/09/2015 1435   AST 10 08/06/2015 1400   AST 14* 07/09/2015 1435   ALT <9 08/06/2015 1400   ALT 10* 07/09/2015 1435   ALKPHOS 77 08/06/2015 1400   ALKPHOS 74 07/09/2015 1435   BILITOT 0.53 08/06/2015 1400   BILITOT 0.6 07/09/2015 1435   GFRNONAA >60 07/09/2015 1435   GFRNONAA >60 01/19/2012 0834   GFRAA >60 07/09/2015 1435   GFRAA >60 01/19/2012 0834         RADIOGRAPHY: Mr Jeri Cos Wo Contrast  08/15/2015  CLINICAL DATA:  70 year old male with new diagnosis of lung cancer, personal history of left  renal cell carcinoma status post nephrectomy. Recently discovered brain metastasis. Headaches. Study for stereotactic radiosurgery planning. Subsequent encounter. EXAM: MRI HEAD WITHOUT AND WITH CONTRAST TECHNIQUE: Multiplanar, multiecho pulse sequences of the brain and surrounding structures were obtained without and with intravenous contrast. CONTRAST:  43m MULTIHANCE GADOBENATE DIMEGLUMINE 529 MG/ML IV SOLN [facility]  [facility]24[facility]2015 FINDINGS: Rounded right temporal lobe periventricular enhancing mass re- demonstrated and stable since 07/02/2015 measuring 6-7 mm. On today's axial post-contrast images this appears inseparable from the choroid all the right temporal horn (series 10, image 72). There is no surrounding edema. There is no significant mass effect. This abuts the right hippocampal formation (series 9, image 28). Pulsation artifact in the posterior fossa on post-contrast images. No other abnormal enhancement of the brain or intracranial mass. No dural thickening. No restricted diffusion to suggest acute infarction. No midline shift, ventriculomegaly, extra-axial collection or acute intracranial hemorrhage. Cervicomedullary junction and pituitary are  within normal limits. Major intracranial vascular flow voids are stable. 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 There is widespread scattered nonspecific cerebral white matter T2 and FLAIR hyperintensity. No chronic cerebral blood products. Negative visualized cervical spine and spinal cord. Visible bone marrow signal is stable, mildly heterogeneous but with no destructive osseous lesion. Visible internal auditory structures appear normal. Mastoids are clear. Paranasal sinus pneumatization has improved. Negative orbit and scalp soft tissues. IMPRESSION: 1. Solitary small 6-7 mm right temporal lobe subependymal or periventricular enhancing lesion is stable since 07/02/2015 and without associated edema or mass effect. Given the location and stability this might be a small primary choroid plexus tumor rather than a metastasis, and might be low grade. Are there any Brain MRI comparisons prior to 2017? 2. No other abnormal enhancement of the brain or acute intracranial abnormality. 3. 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 Electronically Signed   By: HGenevie AnnM.D.   On: 08/15/2015 13:51   Ct Biopsy  07/23/2015  INDICATION: 70year old male with left lower lobe pulmonary mass and necrotic mediastinal adenopathy. He underwent prior endobronchial biopsy which was negative. He presents for CT-guided biopsy of the left lower lobe mass in an effort to attain tissue diagnosis. EXAM: CT-guided biopsy left lower lobe pulmonary nodule Interventional Radiologist:  HCriselda Peaches MD MEDICATIONS: None. ANESTHESIA/SEDATION: Fentanyl 75 mcg IV; Versed to mg IV Moderate Sedation Time:  17 The patient was continuously monitored during the procedure by the interventional radiology nurse under my direct supervision. FLUOROSCOPY TIME:  None COMPLICATIONS: None immediate. Estimated blood loss:   0 PROCEDURE: Informed written consent was obtained from the patient after a thorough discussion of the procedural risks, benefits and alternatives. All questions were addressed. Maximal Sterile Barrier Technique was utilized including caps, mask, sterile gowns, sterile gloves, sterile drape, hand hygiene and skin antiseptic. A timeout was performed prior to the initiation of the procedure. A planning axial CT scan was performed. The nodule in the left lower lobe was successfully identified. A suitable skin entry site was selected and marked. The region was then sterilely prepped and draped in standard fashion using Betadine skin prep. Local anesthesia was attained by infiltration with 1% lidocaine. A small dermatotomy was made. Under intermittent CT fluoroscopic guidance, a 17 gauge trocar needle was advanced into the lung and positioned at the margin of the nodule. Multiple  18 gauge core biopsies were then coaxially obtained using the BioPince automated biopsy device. Biopsy specimens were placed in formalin and delivered to pathology for further analysis. The biopsy device was removed. A BioSentry device was applied as the introducer needle was removed. Post biopsy axial CT imaging demonstrates no evidence of immediate complication. There is no pneumothorax. Mild perilesional alveolar hemorrhage is not unexpected. The patient tolerated the procedure well. IMPRESSION: Technically successful CT-guided biopsy left lower lobe pulmonary nodule. Signed, Criselda Peaches, MD [facility] Electronically Signed   By: Jacqulynn Cadet M.D.   On: 07/23/2015 10:11   Dg Chest Port 1 View  07/22/2015  CLINICAL DATA:  Status post left lower lobe biopsy EXAM: PORTABLE CHEST 1 VIEW COMPARISON:  PET-CT June 24, 2015; chest radiograph July 09 2015 FINDINGS: There is no demonstrable pneumothorax. Irregular opacity in the left base medially persists. Elsewhere  lungs appear somewhat hyperexpanded. No new opacity evident. Heart is prominent with pulmonary vascularity within normal limits. No adenopathy. Patient is status post internal mammary bypass grafting. There is postoperative change in the lower cervical spine region. IMPRESSION: No demonstrable pneumothorax. Ill-defined irregular opacity medial left base persists. No new opacity evident. Stable cardiac prominence. Electronically Signed   By: Lowella Grip III M.D.   On: 07/22/2015 10:49      IMPRESSION/PLAN: This is a very pleasant 70 year old man with metastatic renal cell carcinoma and a single lesion in the brain suspicious for metastasis, cannot exclude a primary choroid plexus tumor. Statistically speaking, however, this is more likely a metastasis.  Patient would like to proceed with treatment accordingly.   I had a lengthy discussion with the patient after reviewing their MRI results with them.  We spoke about whole brain radiotherapy versus stereotactic radiosurgery to the brain. We spoke about the differing risks benefits and side effects of both of these treatments. During part of our discussion, we spoke about the hair loss, fatigue and cognitive effects that can result from whole brain radiotherapy.  Additionally, we spoke about radionecrosis that can result from stereotactic radiosurgery. I explained that whole brain radiotherapy is more comprehensive and therefore can decrease the chance of recurrences elsewhere in the brain, while stereotactic radiosurgery only treats the areas of gross disease while sparing the rest of the brain parenchyma. I recommend  proceeding with stereotactic brain radiosurgery. I do not think resection or biopsy is warranted.  After lengthy discussion, the patient would like to proceed with stereotactic brain radiosurgery to their metastatic disease. They will meet with neurosurgery in the near future to discuss this further; a neurosurgeon will participate in their  case.  CT simulation will take place on today and treatment on April 26.  I plan to deliver 20 Gy in 1 fraction to the brain tumor.  I recommended he discuss his neck pain with Dr Sherwood Gambler in [place].  Pt has history of neck surgery.  PET is negative for obvious C spine mets.  I discussed this case with Dr Sherwood Gambler and he may order C spine Xray in his office tomorrow.  Pt will discuss high BP with Dr [name], as this may be a side effect of Votrient.    __________________________________________   Eppie Gibson, MD

## 2015-08-18 NOTE — Addendum Note (Signed)
Encounter addended by: Ernst Spell, RN on: 08/18/2015 10:25 AM<BR>     Documentation filed: Charges VN

## 2015-08-19 ENCOUNTER — Telehealth: Payer: Self-pay | Admitting: Internal Medicine

## 2015-08-19 ENCOUNTER — Encounter: Payer: Self-pay | Admitting: Internal Medicine

## 2015-08-19 ENCOUNTER — Other Ambulatory Visit (HOSPITAL_BASED_OUTPATIENT_CLINIC_OR_DEPARTMENT_OTHER): Payer: Medicare Other

## 2015-08-19 ENCOUNTER — Ambulatory Visit (HOSPITAL_BASED_OUTPATIENT_CLINIC_OR_DEPARTMENT_OTHER): Payer: Medicare Other | Admitting: Internal Medicine

## 2015-08-19 VITALS — BP 188/102 | HR 81 | Temp 97.4°F | Resp 17 | Ht 71.0 in | Wt 269.1 lb

## 2015-08-19 DIAGNOSIS — Z5111 Encounter for antineoplastic chemotherapy: Secondary | ICD-10-CM

## 2015-08-19 DIAGNOSIS — I10 Essential (primary) hypertension: Secondary | ICD-10-CM

## 2015-08-19 DIAGNOSIS — C642 Malignant neoplasm of left kidney, except renal pelvis: Secondary | ICD-10-CM

## 2015-08-19 DIAGNOSIS — C7931 Secondary malignant neoplasm of brain: Secondary | ICD-10-CM

## 2015-08-19 DIAGNOSIS — C3432 Malignant neoplasm of lower lobe, left bronchus or lung: Secondary | ICD-10-CM

## 2015-08-19 DIAGNOSIS — Z510 Encounter for antineoplastic radiation therapy: Secondary | ICD-10-CM | POA: Diagnosis not present

## 2015-08-19 HISTORY — DX: Encounter for antineoplastic chemotherapy: Z51.11

## 2015-08-19 HISTORY — DX: Essential (primary) hypertension: I10

## 2015-08-19 LAB — COMPREHENSIVE METABOLIC PANEL
ALT: 11 U/L (ref 0–55)
AST: 15 U/L (ref 5–34)
Albumin: 3.5 g/dL (ref 3.5–5.0)
Alkaline Phosphatase: 85 U/L (ref 40–150)
Anion Gap: 11 mEq/L (ref 3–11)
BILIRUBIN TOTAL: 0.62 mg/dL (ref 0.20–1.20)
BUN: 19 mg/dL (ref 7.0–26.0)
CALCIUM: 9.9 mg/dL (ref 8.4–10.4)
CO2: 24 mEq/L (ref 22–29)
CREATININE: 1.1 mg/dL (ref 0.7–1.3)
Chloride: 102 mEq/L (ref 98–109)
EGFR: 68 mL/min/{1.73_m2} — ABNORMAL LOW (ref >90)
GLUCOSE: 124 mg/dL (ref 70–140)
POTASSIUM: 4.2 meq/L (ref 3.5–5.1)
Sodium: 137 mEq/L (ref 136–145)
Total Protein: 7.4 g/dL (ref 6.4–8.3)

## 2015-08-19 LAB — CBC WITH DIFFERENTIAL/PLATELET
BASO%: 0.9 % (ref 0.0–2.0)
BASOS ABS: 0.1 10*3/uL (ref 0.0–0.1)
EOS ABS: 0.5 10*3/uL (ref 0.0–0.5)
EOS%: 4.4 % (ref 0.0–7.0)
HEMATOCRIT: 36.4 % — AB (ref 38.4–49.9)
HEMOGLOBIN: 11.5 g/dL — AB (ref 13.0–17.1)
LYMPH#: 2.7 10*3/uL (ref 0.9–3.3)
LYMPH%: 23.5 % (ref 14.0–49.0)
MCH: 24.6 pg — AB (ref 27.2–33.4)
MCHC: 31.7 g/dL — ABNORMAL LOW (ref 32.0–36.0)
MCV: 77.7 fL — ABNORMAL LOW (ref 79.3–98.0)
MONO#: 0.5 10*3/uL (ref 0.1–0.9)
MONO%: 4.7 % (ref 0.0–14.0)
NEUT#: 7.5 10*3/uL — ABNORMAL HIGH (ref 1.5–6.5)
NEUT%: 66.5 % (ref 39.0–75.0)
Platelets: 336 10*3/uL (ref 140–400)
RBC: 4.68 10*6/uL (ref 4.20–5.82)
RDW: 15.5 % — AB (ref 11.0–14.6)
WBC: 11.3 10*3/uL — AB (ref 4.0–10.3)

## 2015-08-19 LAB — LACTATE DEHYDROGENASE: LDH: 232 U/L (ref 125–245)

## 2015-08-19 NOTE — Progress Notes (Signed)
[place] [phone]   Fax:([phone]) [place], [address] [place] [address]  DIAGNOSIS: metastatic renal cell carcinoma, clear cell type. This was initially diagnosed as a stage III status post left nephrectomy as well as left adrenalectomy on 03/06/2012. 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  PRIOR THERAPY: Stereotactic radiotherapy to the solitary brain lesion scheduled for 08/20/2015  CURRENT THERAPY: Votrient 800 mg by mouth daily started 08/08/2015.  INTERVAL HISTORY: Patrick Breed Sr. 70 y.o. male returns to the clinic today for follow-up visit. The patient was started on treatment with Votrient almost 10 days ago and tolerating his treatment well with no specific complaints except for mild fatigue. He also has one or 2 episodes of loose stool but no clear diarrhea. His blood pressure still well not controlled. He denied having any significant nausea, vomiting or constipation. He has no chest pain, shortness of breath, cough or hemoptysis. He is scheduled for stereotactic radiotherapy to the solitary brain lesion tomorrow. He is here today for evaluation and repeat blood work.  MEDICAL HISTORY: Past Medical History  Diagnosis Date  . Hypertension   . Hyperlipidemia   . Anxiety   . Asthma   . COPD (chronic obstructive pulmonary disease) ([place])   . Coronary artery disease   . Depression   . Heart murmur   . Hypothyroidism   . Sleep apnea     CPAP  . AAA (abdominal aortic aneurysm) ([place])   . GERD (gastroesophageal reflux disease)   . Headache   . Chronic kidney disease     Renal Cell Adenocarcinoma  . Chronic kidney disease     Left Nephrectomy  . Hyperparathyroidism ([place])   . Neuropathy ([place])   .  Cancer [facility])     Renal Cell- 2013; Lung 2017  . Eczema   . HPTH (hyperparathyroidism) ([place])   . Thoracic aortic aneurysm ([place]) 2017  . Shortness of breath dyspnea   . 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  MEDICATIONS:  Current Outpatient Prescriptions  Medication Sig Dispense Refill  . ADVAIR DISKUS 100-50 MCG/DOSE AEPB INHALE 1 PUFF TWICE A DAY 60 each 12  . albuterol (PROVENTIL HFA;VENTOLIN HFA) 108 (90 BASE) MCG/ACT inhaler Inhale 1 puff into the lungs every 6 (six) hours as needed for wheezing.     Marland Kitchen ALPRAZolam (XANAX) 0.5 MG tablet TAKE ONE TABLET BY MOUTH TWICE DAILY AS NEEDED (Patient taking differently: TAKE 0.5 MG BY MOUTH TWICE DAILY AS NEEDED FOR ANXIETY) 60 tablet 4  . aspirin 81 MG tablet Take 81 mg by mouth daily. Reported on 07/01/2015    . carvedilol (COREG) 12.5 MG tablet TAKE ONE TABLET BY MOUTH TWICE DAILY (Patient taking differently: TAKE 12.5 MG BY MOUTH TWICE DAILY) 60 tablet 12  . Cholecalciferol 1000 UNITS tablet Take 1,000 Units by mouth daily.     Marland Kitchen FLUoxetine (PROZAC) 40 MG capsule TAKE ONE CAPSULE BY MOUTH EVERY DAY (Patient taking differently: TAKE 40 MG BY MOUTH EVERY DAY) 30 capsule 12  . losartan (COZAAR) 100 MG tablet TAKE ONE TABLET BY MOUTH EVERY DAY (Patient taking differently: TAKE 100 MG BY MOUTH EVERY  DAY) 30 tablet 12  . meclizine (ANTIVERT) 25 MG tablet Take 25 mg by mouth as needed for dizziness.     . metFORMIN (GLUCOPHAGE) 1000 MG tablet Take 1,000 mg by mouth 2 (two) times daily with a meal. Reported on 07/01/2015    . omeprazole (PRILOSEC) 20 MG capsule Take 20 mg by mouth daily.     . pazopanib (VOTRIENT) 200 MG tablet Take 4 tablets (800 mg total) by mouth daily. Take on an empty stomach. 120 tablet 2  . pioglitazone (ACTOS) 45 MG tablet TAKE ONE TABLET BY MOUTH EACH DAY. (Patient taking differently: TAKE 45 MG BY MOUTH EACH DAY.) 30 tablet 12   . prochlorperazine (COMPAZINE) 10 MG tablet Take 1 tablet (10 mg total) by mouth every 6 (six) hours as needed for nausea or vomiting. 30 tablet 0  . simvastatin (ZOCOR) 40 MG tablet TAKE ONE TABLET BY MOUTH EVERY DAY 30 tablet 12   No current facility-administered medications for this visit.    SURGICAL HISTORY:  Past Surgical History  Procedure Laterality Date  . Parathyroidectomy  12/17/09  . Coronary artery bypass graft      x 5  . Back surgery      compressed vertebrae-put in a plate to seprate the spaces  . Fracture surgery      left arm repair  . Knee surgery      left knee repair  . Cardiac catheterization    . Cervical fusion    . Endobronchial ultrasound N/A 06/30/2015    Procedure: ENDOHIAL ULTRASOUND;  Surgeon: Flora Lipps, MD;  Location: [facility] ORS;  Service: Cardiopulmonary;  Laterality: N/A;  . Video bronchoscopy with endobronchial ultrasound N/A 07/09/2015    Procedure: VIDEO BRONCHOSCOPY WITH ENDOBRONCHIAL ULTRASOUND;  Surgeon: Ivin Poot, MD;  Location: [facility] OR;  Service: Thoracic;  Laterality: N/A;    REVIEW OF SYSTEMS:  A comprehensive review of systems was negative except for: Constitutional: positive for fatigue Gastrointestinal: positive for diarrhea   PHYSICAL EXAMINATION: General appearance: alert, cooperative, fatigued and no distress Head: Normocephalic, without obvious abnormality, atraumatic Neck: no adenopathy, no JVD, supple, symmetrical, trachea midline and thyroid not enlarged, symmetric, no tenderness/mass/nodules Lymph nodes: Cervical, supraclavicular, and axillary nodes normal. Resp: clear to auscultation bilaterally Back: symmetric, no curvature. ROM normal. No CVA tenderness. Cardio: regular rate and rhythm, S1, S2 normal, no murmur, click, rub or gallop GI: soft, non-tender; bowel sounds normal; no masses,  no organomegaly Extremities: extremities normal, atraumatic, no cyanosis or edema  ECOG PERFORMANCE STATUS: 1 - Symptomatic but  completely ambulatory  Blood pressure 188/102, pulse 81, temperature 97.4 F (36.3 C), temperature source Oral, resp. rate 17, height '5\' 11"'$  (1.803 m), weight 269 lb 1.6 oz (122.063 kg), SpO2 98 %.  LABORATORY DATA: Lab Results  Component Value Date   WBC 11.3* 08/19/2015   HGB 11.5* 08/19/2015   HCT 36.4* 08/19/2015   MCV 77.7* 08/19/2015   PLT 336 08/19/2015      Chemistry      Component Value Date/Time   NA 140 08/06/2015 1400   NA 141 07/09/2015 1435   NA 138 11/21/2013   NA 140 01/19/2012 0834   K 4.5 08/06/2015 1400   K 4.4 07/09/2015 1435   K 4.0 01/19/2012 0834   CL 104 07/09/2015 1435   CL 103 01/19/2012 0834   CO2 28 08/06/2015 1400   CO2 26 07/09/2015 1435   CO2 28 01/19/2012 0834   BUN 14.8 08/06/2015 1400   BUN 13  07/09/2015 1435   BUN 17 11/21/2013   BUN 12 01/19/2012 0834   CREATININE 1.0 08/06/2015 1400   CREATININE 1.04 07/09/2015 1435   CREATININE 1.1 11/21/2013   CREATININE 0.84 01/19/2012 0834   GLU 159 11/21/2013      Component Value Date/Time   CALCIUM 9.5 08/06/2015 1400   CALCIUM 9.3 07/09/2015 1435   CALCIUM 9.1 01/19/2012 0834   ALKPHOS 77 08/06/2015 1400   ALKPHOS 74 07/09/2015 1435   AST 10 08/06/2015 1400   AST 14* 07/09/2015 1435   ALT <9 08/06/2015 1400   ALT 10* 07/09/2015 1435   BILITOT 0.53 08/06/2015 1400   BILITOT 0.6 07/09/2015 1435       RADIOGRAPHIC STUDIES: Mr Jeri Cos Wo Contrast  27-Aug-2015  CLINICAL DATA:  70 year old male with new diagnosis of lung cancer, personal history of left renal cell carcinoma status post nephrectomy. Recently discovered brain metastasis. Headaches. Study for stereotactic radiosurgery planning. Subsequent encounter. EXAM: MRI HEAD WITHOUT AND WITH CONTRAST TECHNIQUE: Multiplanar, multiecho pulse sequences of the brain and surrounding structures were obtained without and with intravenous contrast. CONTRAST:  22m MULTIHANCE GADOBENATE DIMEGLUMINE 529 MG/ML IV SOLN [facility]  [facility] FINDINGS: Rounded right temporal lobe periventricular enhancing mass re- demonstrated and stable since 07/02/2015 measuring 6-7 mm. On today's axial post-contrast images this appears inseparable from the choroid all the right temporal horn (series 10, image 72). There is no surrounding edema. There is no significant mass effect. This abuts the right hippocampal formation (series 9, image 28). Pulsation artifact in the posterior fossa on post-contrast images. No other abnormal enhancement of the brain or intracranial mass. No dural thickening. No restricted diffusion to suggest acute infarction. No midline shift, ventriculomegaly, extra-axial collection or acute intracranial hemorrhage. Cervicomedullary junction and pituitary are within normal limits. Major intracranial vascular flow voids are stable. 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 There is widespread scattered nonspecific cerebral white matter T2 and FLAIR hyperintensity. No chronic cerebral blood products. Negative visualized cervical spine and spinal cord. Visible bone marrow signal is stable, mildly heterogeneous but with no destructive osseous lesion. Visible internal auditory structures appear normal. Mastoids are clear. Paranasal sinus pneumatization has improved. Negative orbit and scalp soft tissues. IMPRESSION: 1. Solitary small 6-7 mm right temporal lobe subependymal or periventricular enhancing lesion is stable since 07/02/2015 and without associated edema or mass effect. Given the location and stability this might be a small primary choroid plexus tumor rather than a metastasis, and might be low grade. Are there any Brain MRI comparisons prior to 2017? 2. No other abnormal enhancement of the brain or acute intracranial abnormality. 3. 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 Electronically Signed   By: HGenevie AnnM.D.   On: [id number]   Ct Biopsy  07/23/2015  INDICATION: 70year old male with left lower lobe pulmonary mass and necrotic mediastinal adenopathy. He underwent prior endobronchial biopsy which was negative. He presents for CT-guided biopsy of the left lower lobe mass in an effort to attain tissue diagnosis. EXAM: CT-guided biopsy left lower lobe pulmonary nodule Interventional Radiologist:  HCriselda Peaches MD MEDICATIONS: None. ANESTHESIA/SEDATION: Fentanyl 75 mcg IV; Versed to mg IV Moderate Sedation Time:  17 The patient was continuously monitored during the procedure by the interventional radiology nurse under my direct supervision.  FLUOROSCOPY TIME:  None COMPLICATIONS: None immediate. Estimated blood loss:  0 PROCEDURE: Informed written consent was obtained from the patient after a thorough discussion of the procedural risks, benefits and alternatives. All questions were addressed. Maximal Sterile Barrier Technique was utilized including caps, mask, sterile gowns, sterile gloves, sterile drape, hand hygiene and skin antiseptic. A timeout was performed prior to the initiation of the procedure. A planning axial CT scan was performed. The nodule in the left lower lobe was successfully identified. A suitable skin entry site was selected and marked. The region was then sterilely prepped and draped in standard fashion using Betadine skin prep. Local anesthesia was attained by infiltration with 1% lidocaine. A small dermatotomy was made. Under intermittent CT fluoroscopic guidance, a 17 gauge trocar needle was advanced into the lung and positioned at the margin of the nodule. Multiple 18 gauge core biopsies were then coaxially obtained using the BioPince automated biopsy device. Biopsy specimens were placed in formalin and delivered to pathology for further analysis. The biopsy device  was removed. A BioSentry device was applied as the introducer needle was removed. Post biopsy axial CT imaging demonstrates no evidence of immediate complication. There is no pneumothorax. Mild perilesional alveolar hemorrhage is not unexpected. The patient tolerated the procedure well. IMPRESSION: Technically successful CT-guided biopsy left lower lobe pulmonary nodule. Signed, Criselda Peaches, MD [facility] Electronically Signed   By: Jacqulynn Cadet M.D.   On: 07/23/2015 10:11   Dg Chest Port 1 View  07/22/2015  CLINICAL DATA:  Status post left lower lobe biopsy EXAM: PORTABLE CHEST 1 VIEW COMPARISON:  PET-CT June 24, 2015; chest radiograph July 09 2015 FINDINGS: There is no demonstrable pneumothorax. Irregular opacity in the left base medially persists. Elsewhere lungs appear somewhat hyperexpanded. No new opacity evident. Heart is prominent with pulmonary vascularity within normal limits. No adenopathy. Patient is status post internal mammary bypass grafting. There is postoperative change in the lower cervical spine region. IMPRESSION: No demonstrable pneumothorax. Ill-defined irregular opacity medial left base persists. No new opacity evident. Stable cardiac prominence. Electronically Signed   By: Lowella Grip III M.D.   On: 07/22/2015 10:49    ASSESSMENT AND PLAN: This is a very pleasant 70 years old white male with metastatic renal cell carcinoma currently on treatment with Votrient 800 mg by mouth daily status post 10 days of treatment and he has been tolerating his treatment well except for mild fatigue. I recommended for the patient to continue his treatment with the same dose for now. He is scheduled for stereotactic radiotherapy to a solitary brain metastasis tomorrow. For hypertension, I strongly advised the patient to take his blood pressure medication as recommended by his cardiologist and to a consult with his  cardiologist regarding adjusting his medication.Marland Kitchen He would come back for follow-up visit in 2 weeks for evaluation and management of any adverse effect of his treatment. The patient was advised to call immediately if he has any concerning symptoms in the interval. The patient voices understanding of current disease status and treatment options and is in agreement with the current care plan.  All questions were answered. The patient knows to call the clinic with any problems, questions or concerns. We can certainly see the patient much sooner if necessary.  Disclaimer: This note was dictated with voice recognition software. Similar sounding words can inadvertently be transcribed and may not be corrected upon review.

## 2015-08-19 NOTE — Addendum Note (Signed)
Addended by: Curt Bears on: 08/19/2015 04:19 PM   Modules accepted: Orders

## 2015-08-19 NOTE — Telephone Encounter (Signed)
left msg for may appt

## 2015-08-20 ENCOUNTER — Other Ambulatory Visit: Payer: Self-pay | Admitting: Family Medicine

## 2015-08-20 ENCOUNTER — Ambulatory Visit
Admission: RE | Admit: 2015-08-20 | Discharge: 2015-08-20 | Disposition: A | Discharge status: Home / Self Care | Payer: Medicare Other | Source: Ambulatory Visit | Attending: Radiation Oncology | Admitting: Radiation Oncology

## 2015-08-20 ENCOUNTER — Encounter: Payer: Self-pay | Admitting: Radiation Oncology

## 2015-08-20 ENCOUNTER — Telehealth: Payer: Self-pay | Admitting: Radiation Therapy

## 2015-08-20 VITALS — BP 157/95 | HR 78 | Temp 97.7°F | Ht 71.0 in

## 2015-08-20 DIAGNOSIS — Z510 Encounter for antineoplastic radiation therapy: Secondary | ICD-10-CM | POA: Diagnosis not present

## 2015-08-20 DIAGNOSIS — C7931 Secondary malignant neoplasm of brain: Secondary | ICD-10-CM

## 2015-08-20 DIAGNOSIS — Z923 Personal history of irradiation: Secondary | ICD-10-CM

## 2015-08-20 HISTORY — DX: Personal history of irradiation: Z92.3

## 2015-08-20 NOTE — Telephone Encounter (Signed)
I Let Mr. Berkel know that his BUN and Creat are normal on his recent lab results. I've instructed him to go ahead and resume his Metformin that had been held since his CT [facility].   He also asked if he needs to fast for his treatment later today. I told him that he can eat and drink without any restrictions for his treatment.  Mont Dutton R.T.(R)(T) Special Procedures Navigator

## 2015-08-20 NOTE — Progress Notes (Signed)
   Radiation Oncology         [phone]) [phone] ________________________________  Stereotactic Treatment Procedure Note  Name: Patrick COATES Sr. MRN: [id number]  Date: 08/20/2015  DOB: 1945-11-22  SPECIAL TREATMENT PROCEDURE    ICD-9-CM ICD-10-CM   1. Brain metastasis ([place]) 198.3 C79.31     3D TREATMENT PLANNING AND DOSIMETRY:  The patient's radiation plan was reviewed and approved by neurosurgery and radiation oncology prior to treatment.  It showed 3-dimensional radiation distributions overlaid onto the planning CT/MRI image set.  The [facility] for the target structures as well as the organs at risk were reviewed. The documentation of the 3D plan and dosimetry are filed in the radiation oncology EMR.  NARRATIVE:  Patrick Breed Sr. was brought to the TrueBeam stereotactic radiation treatment machine and placed supine on the CT couch. The head frame was applied, and the patient was set up for stereotactic radiosurgery.  Neurosurgery was present for the set-up and delivery  SIMULATION VERIFICATION:  In the couch zero-angle position, the patient underwent Exactrac imaging using the Brainlab system with orthogonal KV images.  These were carefully aligned and repeated to confirm treatment position for each of the isocenters.  The Exactrac snap film verification was repeated at each couch angle.  PROCEDURE: Patrick Breed Sr. received stereotactic radiosurgery to the following targets: Right temporal 65m  target was treated using 4 Dynamic Conformal Arcs to a prescription dose of 20 Gy.  ExacTrac registration was performed for each couch angle.  6 MV   X-rays were delivered in the flattening filter free beam mode.   STEREOTACTIC TREATMENT MANAGEMENT:  Following delivery, the patient was transported to nursing in stable condition and monitored for possible acute effects.  Vital signs were recorded BP 157/95 mmHg  Pulse 78  Temp(Src) 97.7 F (36.5 C)  Ht '5\' 11"'$  (1.803 m)  SpO2 95%. The patient tolerated  treatment without significant acute effects, and was discharged to home in stable condition.    PLAN: Follow-up in one month.  -----------------------------------  SEppie Gibson MD

## 2015-08-20 NOTE — Op Note (Signed)
Stereotactic Radiosurgery Operative Note  Name: Patrick GAHM Sr. MRN: [id number]  Date: 08/20/2015  DOB: 08/07/1945  Op Note  Pre Operative Diagnosis:  Metastatic renal cell carcinoma with lung and brain metastases  Post Operative Diagnois:  Metastatic renal cell carcinoma with lung and brain metastases  3D TREATMENT PLANNING AND DOSIMETRY:  The patient's radiation plan was reviewed and approved by myself (neurosurgery) and Dr. Eppie Gibson (radiation oncology) prior to treatment.  It showed 3-dimensional radiation distributions overlaid onto the planning CT/MRI image set.  The [facility] for the target structures as well as the organs at risk were reviewed. The documentation of the 3D plan and dosimetry are filed in the radiation oncology EMR.  NARRATIVE:  Patrick Breed Sr. was brought to the TrueBeam stereotactic radiation treatment machine and placed supine on the CT couch. The head frame was applied, and the patient was set up for stereotactic radiosurgery.  I was present for the set-up and delivery.  SIMULATION VERIFICATION:  In the couch zero-angle position, the patient underwent Exactrac imaging using the Brainlab system with orthogonal KV images.  These were carefully aligned and repeated to confirm treatment position for each of the isocenters.  The Exactrac snap film verification was repeated at each couch angle.  SPECIAL TREATMENT PROCEDURE: Patrick Breed Sr. received stereotactic radiosurgery to the following targets: Right temperal target (7 mm) was treated using 4 Dynamic Conformal Arcs to a prescription dose of 20 Gy.  ExacTrac registration was performed for each couch angle.  The 82.5% isodose line was prescribed.  STEREOTACTIC TREATMENT MANAGEMENT:  Following delivery, the patient was transported to nursing in stable condition and monitored for possible acute effects.  Vital signs were recorded.  The patient tolerated treatment without significant acute effects, and was discharged to  home in stable condition.    PLAN: Follow-up in one month.

## 2015-08-20 NOTE — Progress Notes (Signed)
[name] denies pain, nausea, dizziness, or headache at this time. He knows to call if he has any concerns or questions.   BP 157/95 mmHg  Pulse 78  Temp(Src) 97.7 F (36.5 C)  Ht '5\' 11"'$  (1.803 m)  SpO2 95%

## 2015-08-20 NOTE — Progress Notes (Signed)
Mr. Bonet completed SRS BRain.  He denies any headaches, blurred vision, unsteady gait nor changes in fine motor movement.  Up to chair.  BP 158/95 mmHg  Pulse 75  Temp(Src) 97.5 F (36.4 C)  Ht '5\' 11"'$  (1.803 m)

## 2015-08-21 ENCOUNTER — Ambulatory Visit (INDEPENDENT_AMBULATORY_CARE_PROVIDER_SITE_OTHER): Payer: Medicare Other | Admitting: Family Medicine

## 2015-08-21 VITALS — BP 160/100 | HR 84 | Temp 97.6°F | Resp 16 | Wt 267.0 lb

## 2015-08-21 DIAGNOSIS — R51 Headache: Secondary | ICD-10-CM

## 2015-08-21 DIAGNOSIS — R519 Headache, unspecified: Secondary | ICD-10-CM

## 2015-08-21 DIAGNOSIS — I10 Essential (primary) hypertension: Secondary | ICD-10-CM | POA: Diagnosis not present

## 2015-08-21 MED ORDER — CARVEDILOL 25 MG PO TABS: 25.0000 mg | ORAL_TABLET | Freq: Two times a day (BID) | ORAL | Status: DC

## 2015-08-21 MED ORDER — KETOROLAC TROMETHAMINE 60 MG/2ML IM SOLN
60.0000 mg | Freq: Once | INTRAMUSCULAR | Status: AC
  Administered 2015-08-21: 60 mg via INTRAMUSCULAR

## 2015-08-21 MED ORDER — NAPROXEN 500 MG PO TABS: 500.0000 mg | ORAL_TABLET | Freq: Two times a day (BID) | ORAL | Status: DC

## 2015-08-21 NOTE — Progress Notes (Signed)
Patient ID: [name]., male   DOB: [birth year], 70 y.o.   MRN: [id number]   Park Breed Sr.  MRN: [id number] DOB: 1946-03-10  Subjective:  HPI   The patient is a 70 year old male who presents with headache.  He states that he has had the headache all day.  On a pain scale he states this is the worst pain he has ever had.  He states tha it has gotten increasingly worse throughout the day.  He has taken his Compazine for the nausea he has had with it. He denies chest pain or shortness of breath. No syncope. No neurologic symptoms at all. Patient Active Problem List   Diagnosis Date Noted  . Encounter for antineoplastic chemotherapy 08/19/2015  . Hypertension 08/19/2015  . Brain metastasis ([place]) 08/18/2015  . Chronic obstructive pulmonary disease ([place]) 02/13/2015  . Personal history of other malignant neoplasm of kidney 02/13/2015  . Hyperparathyroidism ([place]) 02/13/2015  . Type 2 diabetes mellitus ([place]) 02/13/2015  . Arteriosclerosis of coronary artery 12/09/2014  . CAFL (chronic airflow limitation) ([place]) 12/09/2014  . Enterogastritis 12/09/2014  . Acid reflux 12/09/2014  . HPTH (hyperparathyroidism) ([place]) 12/09/2014  . BP (high blood pressure) 12/09/2014  . Eczema intertrigo 12/09/2014  . Obstructive apnea 12/09/2014  . COPD, mild ([place]) 09/21/2014  . Atherosclerosis of coronary artery 09/21/2014  . Clinical depression 09/21/2014  . Diabetes mellitus, type 2 ([place]) 09/21/2014  . Essential (primary) hypertension 09/21/2014  . Cardiac murmur 09/21/2014  . HLD (hyperlipidemia) 09/21/2014  . Adult hypothyroidism 09/21/2014  . Adiposity 09/21/2014  . Adenocarcinoma, renal cell ([place]) 09/21/2014  . Malignant neoplasm of kidney ([place]) 02/09/2013    Past Medical History  Diagnosis Date  . Hypertension   . Hyperlipidemia   . Anxiety   . Asthma   . COPD (chronic obstructive pulmonary disease) ([place])   . Coronary artery disease   . Depression   . Heart murmur   . Hypothyroidism    . Sleep apnea     CPAP  . AAA (abdominal aortic aneurysm) ([place])   . GERD (gastroesophageal reflux disease)   . Headache   . Chronic kidney disease     Renal Cell Adenocarcinoma  . Chronic kidney disease     Left Nephrectomy  . Hyperparathyroidism ([place])   . Neuropathy ([place])   . Cancer [facility])     Renal Cell- 2013; Lung 2017  . Eczema   . HPTH (hyperparathyroidism) ([place])   . Thoracic aortic aneurysm ([place]) 2017  . Shortness of breath dyspnea   . Diabetes mellitus without complication ([place])     Type II  . Encounter for antineoplastic chemotherapy 08/19/2015  . Hypertension 08/19/2015    Social History   Social History  . Marital Status: Married    Spouse Name: N/A  . Number of Children: N/A  . Years of Education: N/A   Occupational History  . Not on file.   Social History Main Topics  . Smoking status: Former Smoker -- 1.50 packs/day for 35 years    Types: Cigarettes    Quit date: 12/24/2001  . Smokeless tobacco: Never Used  . Alcohol Use: No  . Drug Use: No  . Sexual Activity: Not on file   Other Topics Concern  . Not on file   Social History Narrative    Outpatient Prescriptions Prior to Visit  Medication Sig Dispense Refill  . ADVAIR DISKUS 100-50 MCG/DOSE AEPB INHALE 1 PUFF TWICE A DAY 60 each 12  .  albuterol (PROVENTIL HFA;VENTOLIN HFA) 108 (90 BASE) MCG/ACT inhaler Inhale 1 puff into the lungs every 6 (six) hours as needed for wheezing.     Marland Kitchen ALPRAZolam (XANAX) 0.5 MG tablet TAKE ONE TABLET BY MOUTH TWICE DAILY AS NEEDED (Patient taking differently: TAKE 0.5 MG BY MOUTH TWICE DAILY AS NEEDED FOR ANXIETY) 60 tablet 4  . aspirin 81 MG tablet Take 81 mg by mouth daily. Reported on 07/01/2015    . carvedilol (COREG) 12.5 MG tablet TAKE ONE TABLET BY MOUTH TWICE DAILY (Patient taking differently: TAKE 12.5 MG BY MOUTH TWICE DAILY) 60 tablet 12  . Cholecalciferol 1000 UNITS tablet Take 1,000 Units by mouth daily.     Marland Kitchen FLUoxetine (PROZAC) 40 MG capsule TAKE ONE  CAPSULE BY MOUTH EVERY DAY (Patient taking differently: TAKE 40 MG BY MOUTH EVERY DAY) 30 capsule 12  . losartan (COZAAR) 100 MG tablet TAKE ONE TABLET BY MOUTH EVERY DAY (Patient taking differently: TAKE 100 MG BY MOUTH EVERY DAY) 30 tablet 12  . meclizine (ANTIVERT) 25 MG tablet Take 25 mg by mouth as needed for dizziness.     . metFORMIN (GLUCOPHAGE) 1000 MG tablet Take 1,000 mg by mouth 2 (two) times daily with a meal. Reported on 07/01/2015    . omeprazole (PRILOSEC) 20 MG capsule TAKE 1 CAPSULE BY MOUTH EVERY MORNING 30 capsule 12  . pazopanib (VOTRIENT) 200 MG tablet Take 4 tablets (800 mg total) by mouth daily. Take on an empty stomach. 120 tablet 2  . pioglitazone (ACTOS) 45 MG tablet TAKE ONE TABLET BY MOUTH EACH DAY. (Patient taking differently: TAKE 45 MG BY MOUTH EACH DAY.) 30 tablet 12  . prochlorperazine (COMPAZINE) 10 MG tablet Take 1 tablet (10 mg total) by mouth every 6 (six) hours as needed for nausea or vomiting. 30 tablet 0  . simvastatin (ZOCOR) 40 MG tablet TAKE ONE TABLET BY MOUTH EVERY DAY 30 tablet 12   No facility-administered medications prior to visit.    Allergies  Allergen Reactions  . Amoxicillin Rash and Other (See Comments)    Other reaction(s): ANAPHYLAXIS  . Codeine Rash and Other (See Comments)    Other reaction(s): ANAPHYLAXIS  . Penicillins Rash and Other (See Comments)    Other reaction(s): ANAPHYLAXIS  . Ace Inhibitors Cough    Review of Systems  Constitutional: Positive for malaise/fatigue. Negative for fever and chills.  HENT: Negative for congestion, ear discharge, ear pain, hearing loss, sore throat and tinnitus.   Eyes: Positive for pain (left eye and temmple area pain.). Negative for blurred vision, double vision, photophobia, discharge and redness.  Respiratory: Positive for shortness of breath. Negative for cough and wheezing.   Cardiovascular: Negative for chest pain, palpitations, orthopnea, claudication and leg swelling.    Gastrointestinal: Negative.   Neurological: Positive for dizziness and headaches. Negative for weakness.  Endo/Heme/Allergies: Negative.   Psychiatric/Behavioral: Negative.    Objective:  BP 160/100 mmHg  Pulse 84  Temp(Src) 97.6 F (36.4 C) (Oral)  Resp 16  Wt 267 lb (121.11 kg)  Physical Exam  Constitutional: He is oriented to person, place, and time and well-developed, well-nourished, and in no distress.  HENT:  Head: Normocephalic and atraumatic.  Right Ear: External ear normal.  Left Ear: External ear normal.  Nose: Nose normal.  Eyes: Conjunctivae are normal.  Neck: Neck supple.  Cardiovascular: Normal rate, regular rhythm and normal heart sounds.   Pulmonary/Chest: Effort normal and breath sounds normal.  Abdominal: Soft.  Neurological: He is alert and oriented  to person, place, and time. No cranial nerve deficit. He exhibits normal muscle tone. Gait normal. Coordination normal. GCS score is 15.  Skin: Skin is warm and dry.  Psychiatric: Mood, memory, affect and judgment normal.    Assessment and Plan :   1. Acute nonintractable headache, unspecified headache type  - ketorolac (TORADOL) injection 60 mg; Inject 2 mLs (60 mg total) into the muscle once. - naproxen (NAPROSYN) 500 MG tablet; Take 1 tablet (500 mg total) by mouth 2 (two) times daily with a meal.  Dispense: 30 tablet; Refill: 0  2. Essential hypertension Return to clinic next week. - carvedilol (COREG) 25 MG tablet; Take 1 tablet (25 mg total) by mouth 2 (two) times daily with a meal.  Dispense: 60 tablet; Refill: 3 3. CAD All risk factors treated 4. 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 The headache is better after Toradol. May need further imaging but will treat for now. Told patient go to ED if he worsens. More than 50% of this visit spent in counseling and discussing issues. I have done the exam and reviewed the above chart and it is accurate to the  best of my knowledge.   Miguel Aschoff MD [facility] 08/21/2015 3:34 PM

## 2015-08-22 ENCOUNTER — Encounter: Payer: Self-pay | Admitting: Pharmacist

## 2015-08-22 NOTE — Progress Notes (Signed)
Oral Chemotherapy Follow-Up Form  Original Start date of oral chemotherapy: _4/14/17___   Called patient today to follow up regarding patient's oral chemotherapy medication: _Votrient 800 mg daily_  Pt is doing well today. Only issue is increased blood pressure. He saw his PCP yesterday who adjusted his medications. Will monitor his blood pressure closely at it appears to have increased since starting the votrient so this is most likely a side effect of the medication.  Pt reports _0___ tablets/doses missed in the last week.   Pt reports the following side effects: _headache/increased blood pressure____    Will follow up and call patient again in _1 week___   Thank you,  Montel Clock, PharmD, [facility]

## 2015-08-28 ENCOUNTER — Ambulatory Visit (INDEPENDENT_AMBULATORY_CARE_PROVIDER_SITE_OTHER): Payer: Medicare Other | Admitting: Family Medicine

## 2015-08-28 VITALS — BP 128/78 | HR 76 | Resp 16 | Wt 266.0 lb

## 2015-08-28 DIAGNOSIS — R51 Headache: Secondary | ICD-10-CM | POA: Diagnosis not present

## 2015-08-28 DIAGNOSIS — R519 Headache, unspecified: Secondary | ICD-10-CM

## 2015-08-28 NOTE — Progress Notes (Signed)
Patient ID: [name]., male   DOB: Oct 02, 1945, 71 y.o.   MRN: [id number]   Patrick Breed Sr.  MRN: [id number] DOB: [birth year]  Subjective:  HPI   1. Acute nonintractable headache, unspecified headache type The patient is a 70 year old male who presents for follow up of an acute headache he had 1 week ago.  The patient was seen on 08/21/15 and he was given an IM injection of Toradol and a prescription for Naproxen.  He got relief from the shot to where when he woke up the next morning he was pain free from the headache.  He did have another headache yesterday but he said it was different than his first headache.  He said it started in his neck and went up the back of his head.  He said it was severe enough that he took 1 Naproxen and about 1 hour later he took two Acetaminophen 500 mg.  He used a heating pad on his neck and did get relief.   Patient Active Problem List   Diagnosis Date Noted  . Encounter for antineoplastic chemotherapy 08/19/2015  . Hypertension 08/19/2015  . Brain metastasis ([place]) 08/18/2015  . Chronic obstructive pulmonary disease ([place]) 02/13/2015  . Personal history of other malignant neoplasm of kidney 02/13/2015  . Hyperparathyroidism ([place]) 02/13/2015  . Type 2 diabetes mellitus ([place]) 02/13/2015  . Arteriosclerosis of coronary artery 12/09/2014  . CAFL (chronic airflow limitation) ([place]) 12/09/2014  . Enterogastritis 12/09/2014  . Acid reflux 12/09/2014  . HPTH (hyperparathyroidism) ([place]) 12/09/2014  . BP (high blood pressure) 12/09/2014  . Eczema intertrigo 12/09/2014  . Obstructive apnea 12/09/2014  . COPD, mild ([place]) 09/21/2014  . Atherosclerosis of coronary artery 09/21/2014  . Clinical depression 09/21/2014  . Diabetes mellitus, type 2 ([place]) 09/21/2014  . Essential (primary) hypertension 09/21/2014  . Cardiac murmur 09/21/2014  . HLD (hyperlipidemia) 09/21/2014  . Adult hypothyroidism 09/21/2014  . Adiposity 09/21/2014  . Adenocarcinoma, renal cell  ([place]) 09/21/2014  . Malignant neoplasm of kidney ([place]) 02/09/2013    Past Medical History  Diagnosis Date  . Hypertension   . Hyperlipidemia   . Anxiety   . Asthma   . COPD (chronic obstructive pulmonary disease) ([place])   . Coronary artery disease   . Depression   . Heart murmur   . Hypothyroidism   . Sleep apnea     CPAP  . AAA (abdominal aortic aneurysm) ([place])   . GERD (gastroesophageal reflux disease)   . Headache   . Chronic kidney disease     Renal Cell Adenocarcinoma  . Chronic kidney disease     Left Nephrectomy  . Hyperparathyroidism ([place])   . Neuropathy ([place])   . Cancer [facility])     Renal Cell- 2013; Lung 2017  . Eczema   . HPTH (hyperparathyroidism) ([place])   . Thoracic aortic aneurysm ([place]) 2017  . Shortness of breath dyspnea   . Diabetes mellitus without complication ([place])     Type II  . Encounter for antineoplastic chemotherapy 08/19/2015  . Hypertension 08/19/2015    Social History   Social History  . Marital Status: Married    Spouse Name: N/A  . Number of Children: N/A  . Years of Education: N/A   Occupational History  . Not on file.   Social History Main Topics  . Smoking status: Former Smoker -- 1.50 packs/day for 35 years    Types: Cigarettes    Quit date: 12/24/2001  . Smokeless tobacco: Never  Used  . Alcohol Use: No  . Drug Use: No  . Sexual Activity: Not on file   Other Topics Concern  . Not on file   Social History Narrative    Outpatient Prescriptions Prior to Visit  Medication Sig Dispense Refill  . ADVAIR DISKUS 100-50 MCG/DOSE AEPB INHALE 1 PUFF TWICE A DAY 60 each 12  . albuterol (PROVENTIL HFA;VENTOLIN HFA) 108 (90 BASE) MCG/ACT inhaler Inhale 1 puff into the lungs every 6 (six) hours as needed for wheezing.     Marland Kitchen ALPRAZolam (XANAX) 0.5 MG tablet TAKE ONE TABLET BY MOUTH TWICE DAILY AS NEEDED (Patient taking differently: TAKE 0.5 MG BY MOUTH TWICE DAILY AS NEEDED FOR ANXIETY) 60 tablet 4  . aspirin 81 MG tablet Take 81 mg by  mouth daily. Reported on 07/01/2015    . carvedilol (COREG) 25 MG tablet Take 1 tablet (25 mg total) by mouth 2 (two) times daily with a meal. 60 tablet 3  . Cholecalciferol 1000 UNITS tablet Take 1,000 Units by mouth daily.     Marland Kitchen FLUoxetine (PROZAC) 40 MG capsule TAKE ONE CAPSULE BY MOUTH EVERY DAY (Patient taking differently: TAKE 40 MG BY MOUTH EVERY DAY) 30 capsule 12  . losartan (COZAAR) 100 MG tablet TAKE ONE TABLET BY MOUTH EVERY DAY (Patient taking differently: TAKE 100 MG BY MOUTH EVERY DAY) 30 tablet 12  . meclizine (ANTIVERT) 25 MG tablet Take 25 mg by mouth as needed for dizziness.     . metFORMIN (GLUCOPHAGE) 1000 MG tablet Take 1,000 mg by mouth 2 (two) times daily with a meal. Reported on 07/01/2015    . naproxen (NAPROSYN) 500 MG tablet Take 1 tablet (500 mg total) by mouth 2 (two) times daily with a meal. 30 tablet 0  . omeprazole (PRILOSEC) 20 MG capsule TAKE 1 CAPSULE BY MOUTH EVERY MORNING 30 capsule 12  . pazopanib (VOTRIENT) 200 MG tablet Take 4 tablets (800 mg total) by mouth daily. Take on an empty stomach. 120 tablet 2  . pioglitazone (ACTOS) 45 MG tablet TAKE ONE TABLET BY MOUTH EACH DAY. (Patient taking differently: TAKE 45 MG BY MOUTH EACH DAY.) 30 tablet 12  . prochlorperazine (COMPAZINE) 10 MG tablet Take 1 tablet (10 mg total) by mouth every 6 (six) hours as needed for nausea or vomiting. 30 tablet 0  . simvastatin (ZOCOR) 40 MG tablet TAKE ONE TABLET BY MOUTH EVERY DAY 30 tablet 12   No facility-administered medications prior to visit.    Allergies  Allergen Reactions  . Amoxicillin Rash and Other (See Comments)    Other reaction(s): ANAPHYLAXIS  . Codeine Rash and Other (See Comments)    Other reaction(s): ANAPHYLAXIS  . Penicillins Rash and Other (See Comments)    Other reaction(s): ANAPHYLAXIS  . Ace Inhibitors Cough    Review of Systems  Constitutional: Positive for chills and malaise/fatigue. Negative for fever.  Eyes: Negative.   Respiratory: Positive  for shortness of breath. Negative for cough and wheezing.   Cardiovascular: Negative for chest pain, palpitations, orthopnea, claudication, leg swelling and PND.  Gastrointestinal: Negative.   Neurological: Positive for dizziness, weakness and headaches.  Endo/Heme/Allergies: Negative.   Psychiatric/Behavioral: Negative.    Objective:  BP 128/78 mmHg  Pulse 76  Resp 16  Wt 266 lb (120.657 kg)  Physical Exam  Constitutional: He is oriented to person, place, and time and well-developed, well-nourished, and in no distress.  HENT:  Head: Normocephalic.  Slight nystagmus to the left  Neck: Normal range of  motion.  Cardiovascular: Normal rate, regular rhythm and normal heart sounds.   Pulmonary/Chest: Effort normal and breath sounds normal.  Neurological: He is alert and oriented to person, place, and time.  Skin: Skin is warm and dry.  Psychiatric: Mood, memory, affect and judgment normal.    Assessment and Plan :  1. Acute nonintractable headache, unspecified headache type It is very possible that this headache was from metastatic disease or radiation therapy treatment. Either way this is resolved for now. Follow clinically. 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 3. CAD All risk factors treated 4. Type 2 diabetes Controlled. I have done the exam and reviewed the above chart and it is accurate to the best of my knowledge.     Miguel Aschoff MD [facility] 08/28/2015 2:00 PM

## 2015-08-29 ENCOUNTER — Telehealth: Payer: Self-pay | Admitting: *Deleted

## 2015-08-29 ENCOUNTER — Encounter: Payer: Self-pay | Admitting: Pharmacist

## 2015-08-29 NOTE — Progress Notes (Signed)
Oral Chemotherapy Follow-Up Form  Original Start date of oral chemotherapy: 08/08/15   Called patient today to follow up regarding patient's oral chemotherapy medication: Votrient 800 mg daily  Pt is doing well today. He does report some fatigue as well as occasional headaches and mild nausea that he controls with compazine. His blood pressure was much improved this week. He reports no missed doses. He has follow up with Dr. [name] on Tuesday 5/9  Pt reports 0 tablets/doses missed in the last week.   Pt reports the following side effects: headache/fatigue and mild nausea    Will follow up and call patient again in 1-2 weeks   Thank you,  Montel Clock, PharmD, [facility]

## 2015-08-29 NOTE — Telephone Encounter (Signed)
Pt called to inquire about an MRI that was ordered by Dr. Lollie Sails office. Pt states " I haven't gotten an appt and i went to see Dr. Rosanna Randy yesterday and he changed my blood pressure medicine. " Discussed with pt to call Dr. Lollie Sails office to inquire, the delay may be due to getting authorization from his insurance company. No further concerns. Pt confirmed upcoming appt with lab/Lisa, NP 09/02/15.

## 2015-09-01 ENCOUNTER — Ambulatory Visit: Payer: Medicare Other | Admitting: Family Medicine

## 2015-09-02 ENCOUNTER — Ambulatory Visit (HOSPITAL_BASED_OUTPATIENT_CLINIC_OR_DEPARTMENT_OTHER): Payer: Medicare Other | Admitting: Nurse Practitioner

## 2015-09-02 ENCOUNTER — Other Ambulatory Visit (HOSPITAL_BASED_OUTPATIENT_CLINIC_OR_DEPARTMENT_OTHER): Payer: Medicare Other

## 2015-09-02 ENCOUNTER — Telehealth: Payer: Self-pay | Admitting: Internal Medicine

## 2015-09-02 VITALS — BP 139/82 | HR 77 | Temp 97.5°F | Resp 18 | Ht 71.0 in | Wt 264.6 lb

## 2015-09-02 DIAGNOSIS — C642 Malignant neoplasm of left kidney, except renal pelvis: Secondary | ICD-10-CM | POA: Diagnosis not present

## 2015-09-02 DIAGNOSIS — C7931 Secondary malignant neoplasm of brain: Secondary | ICD-10-CM | POA: Diagnosis not present

## 2015-09-02 DIAGNOSIS — C3432 Malignant neoplasm of lower lobe, left bronchus or lung: Secondary | ICD-10-CM | POA: Diagnosis not present

## 2015-09-02 DIAGNOSIS — Z5111 Encounter for antineoplastic chemotherapy: Secondary | ICD-10-CM

## 2015-09-02 DIAGNOSIS — I10 Essential (primary) hypertension: Secondary | ICD-10-CM

## 2015-09-02 LAB — COMPREHENSIVE METABOLIC PANEL
ALBUMIN: 3.4 g/dL — AB (ref 3.5–5.0)
ALK PHOS: 82 U/L (ref 40–150)
ALT: 19 U/L (ref 0–55)
ANION GAP: 8 meq/L (ref 3–11)
AST: 23 U/L (ref 5–34)
BILIRUBIN TOTAL: 0.66 mg/dL (ref 0.20–1.20)
BUN: 19.3 mg/dL (ref 7.0–26.0)
CALCIUM: 9.8 mg/dL (ref 8.4–10.4)
CO2: 26 meq/L (ref 22–29)
CREATININE: 1 mg/dL (ref 0.7–1.3)
Chloride: 105 mEq/L (ref 98–109)
EGFR: 72 mL/min/{1.73_m2} — ABNORMAL LOW (ref >90)
Glucose: 132 mg/dl (ref 70–140)
Potassium: 4.7 mEq/L (ref 3.5–5.1)
Sodium: 139 mEq/L (ref 136–145)
TOTAL PROTEIN: 7 g/dL (ref 6.4–8.3)

## 2015-09-02 LAB — CBC WITH DIFFERENTIAL/PLATELET
BASO%: 0.6 % (ref 0.0–2.0)
Basophils Absolute: 0 10*3/uL (ref 0.0–0.1)
EOS ABS: 0.9 10*3/uL — AB (ref 0.0–0.5)
EOS%: 12.6 % — ABNORMAL HIGH (ref 0.0–7.0)
HEMATOCRIT: 36.6 % — AB (ref 38.4–49.9)
HGB: 11.5 g/dL — ABNORMAL LOW (ref 13.0–17.1)
LYMPH#: 1.3 10*3/uL (ref 0.9–3.3)
LYMPH%: 16.9 % (ref 14.0–49.0)
MCH: 25.2 pg — ABNORMAL LOW (ref 27.2–33.4)
MCHC: 31.3 g/dL — ABNORMAL LOW (ref 32.0–36.0)
MCV: 80.4 fL (ref 79.3–98.0)
MONO#: 0.5 10*3/uL (ref 0.1–0.9)
MONO%: 7.2 % (ref 0.0–14.0)
NEUT%: 62.7 % (ref 39.0–75.0)
NEUTROS ABS: 4.7 10*3/uL (ref 1.5–6.5)
PLATELETS: 200 10*3/uL (ref 140–400)
RBC: 4.55 10*6/uL (ref 4.20–5.82)
RDW: 16.7 % — ABNORMAL HIGH (ref 11.0–14.6)
WBC: 7.5 10*3/uL (ref 4.0–10.3)

## 2015-09-02 LAB — LACTATE DEHYDROGENASE: LDH: 249 U/L — ABNORMAL HIGH (ref 125–245)

## 2015-09-02 NOTE — Progress Notes (Signed)
  [place] OFFICE PROGRESS NOTE   DIAGNOSIS:  Metastatic renal cell carcinoma, clear cell type. Initially diagnosed as stage III status post left nephrectomy as well as left adrenalectomy on 03/06/2012. Presented with large left lower lobe lung mass in addition to large left hilar and mediastinal lymphadenopathy as well as bilateral pulmonary nodules and solitary metastatic brain lesion March 2017.  PRIOR THERAPY: Stereotactic radiotherapy to the solitary brain lesion 08/20/2015  CURRENT THERAPY: Votrient 800 mg by mouth daily started 08/08/2015.  INTERVAL HISTORY:   [name] returns for scheduled follow-up. He continues Votrient. He has occasional mild nausea. No mouth sores. He has periodic loose stools. No frank diarrhea. No rash. Dyspnea on exertion is stable. He notes he is more fatigued. He notes an alteration in taste.  Objective:  Vital signs in last 24 hours:  Blood pressure 139/82, pulse 77, temperature 97.5 F (36.4 C), temperature source Oral, resp. rate 18, height '5\' 11"'$  (1.803 m), weight 264 lb 9.6 oz (120.022 kg), SpO2 99 %.    HEENT: No thrush or ulcers. Resp: Lungs clear bilaterally. Cardio: Regular rate and rhythm. GI: Abdomen soft and nontender. No hepatomegaly. Vascular: No leg edema. Skin: No rash.    Lab Results:  Lab Results  Component Value Date   WBC 7.5 09/02/2015   HGB 11.5* 09/02/2015   HCT 36.6* 09/02/2015   MCV 80.4 09/02/2015   PLT 200 09/02/2015   NEUTROABS 4.7 09/02/2015    Imaging:  No results found.  Medications: I have reviewed the patient's current medications.  Assessment/Plan: 1. Renal cell carcinoma, stage III, status post left nephrectomy and left adrenalectomy 2013. February 2017 presented with hemoptysis and found to have a large left lower lobe lung mass, left hilar and mediastinal adenopathy, bilateral pulmonary nodules and a solitary metastatic brain lesion. Status post EBUS directed biopsy of  mediastinal lymph nodes 07/09/2015 with findings of atypical cells not diagnostic of malignancy. Status post CT-guided biopsy of left lower lobe pulmonary nodule 07/22/2015 with pathology showing clear cell carcinoma consistent with metastatic renal cell carcinoma. Votrient initiated 08/08/2015. 2. Solitary metastatic brain lesion status post [facility] 08/20/2015.   Disposition: [name] appears stable. He will continue Votrient. We scheduled a return visit with Dr. [name] in 4 weeks. He will contact the office in the interim with any problems.  Plan reviewed with Dr. [name].    Ned Card ANP/GNP-BC   09/02/2015  4:25 PM

## 2015-09-02 NOTE — Progress Notes (Signed)
Insurance paperwork brought in by pt, pt wife filled out form and laced in drop off box for Raquel

## 2015-09-02 NOTE — Telephone Encounter (Signed)
Gave and printed appt sched and avs for pt for June..the patient needed afternoon appt moved appt to 6.7

## 2015-09-03 ENCOUNTER — Encounter: Payer: Self-pay | Admitting: Internal Medicine

## 2015-09-03 ENCOUNTER — Ambulatory Visit: Payer: Self-pay | Admitting: Radiation Oncology

## 2015-09-03 ENCOUNTER — Ambulatory Visit: Payer: Medicare Other | Admitting: Radiation Oncology

## 2015-09-03 NOTE — Progress Notes (Signed)
  Radiation Oncology         [id number]) [phone] ________________________________  Name: Patrick Breed Sr. MRN: [id number]  Date: 08/20/2015  DOB: 1945-08-02  End of Treatment Note  DIAGNOSIS:    ICD-9-CM ICD-10-CM   1. Brain metastasis ([place]) 198.3 C79.31        Indication for treatment:  palliative    Radiation treatment dates:   08/20/2015  Site/dose:   Right temporal brain 107m / 20 Gy in 1 fraction  Beams/energy:  Stereotactic Radiosurgery, 4 DCA fields / 6FFF photons  Narrative: The patient tolerated radiation treatment relatively well.      Plan: The patient has completed radiation treatment. The patient will return to radiation oncology clinic for routine followup in one month. I advised them to call or return sooner if they have any questions or concerns related to their recovery or treatment.  -----------------------------------  SEppie Gibson MD

## 2015-09-03 NOTE — Progress Notes (Signed)
Left at pod

## 2015-09-05 ENCOUNTER — Telehealth: Payer: Self-pay

## 2015-09-05 ENCOUNTER — Encounter: Payer: Self-pay | Admitting: Internal Medicine

## 2015-09-05 NOTE — Progress Notes (Signed)
left at pod- left form for dr [name] to sign

## 2015-09-05 NOTE — Telephone Encounter (Signed)
Manuela Schwartz from [facility] called the triage line and stated that patient was experiencing diarrhea, fatigue, loss of appetite and bitterness in his mouth. Writer talked with patient who states he has been having diarrhea for a little over a week having 3-4 loose stools daily.  Patient states he is taking pepto bismol and is drinking mountain dew to stay hydrated.  Writer informed him to buy some imodium and to stay away from anything with caffeine.  Patient was instructed on how to take the imodium and encouraged to drink at least 8 8oz glasses of water to stay hydrated.  Patient stated understanding realizing that this may be the reason for the increase in his fatigue.  Patient will call on Monday to update his condition.  Patient will go to the ED if the diarrhea continues over the weekend.  Writer also spoke with him about the signs and symptoms of dehydration- patient states he has been in the ED for dehydration and states he is very aware of the symptoms.

## 2015-09-08 ENCOUNTER — Telehealth: Payer: Self-pay | Admitting: Pharmacist

## 2015-09-08 ENCOUNTER — Encounter: Payer: Self-pay | Admitting: Internal Medicine

## 2015-09-08 ENCOUNTER — Other Ambulatory Visit: Payer: Self-pay | Admitting: Neurosurgery

## 2015-09-08 DIAGNOSIS — M47812 Spondylosis without myelopathy or radiculopathy, cervical region: Secondary | ICD-10-CM

## 2015-09-08 MED FILL — *VOTRIENT 200 MG TABLET: 200 | 30 days supply | Qty: 120 | Fill #1

## 2015-09-08 NOTE — Progress Notes (Signed)
left at pod- left form for dr [name] to sign--faxed [phone] and copy for patient recrds and medical recrds

## 2015-09-08 NOTE — Telephone Encounter (Signed)
Pt left vm on [facility] phone on Sat 09/06/15 at 9:58 am asking for a call back re: Votrient. I was able to reach pt by phone today and we had a lengthy discussion about Votrient. He ran out of tablets yesterday and I have contacted [facility] and they have to order more Votrient but will have them in stock tomorrow around lunch time.  He also is able to go there today for his dose for today so he doesn't miss another day of tx. He is committed to taking his Votrient "until Dr. [name] tells me to stop."  However, he is having several sxs that may be related to Votrient: Nausea - is constant.  He is taking Compazine q6h as prescribed.  He has not vomited. Diarrhea- he called 5/12 and s/w Zigmund Daniel, RN and she recommended Imodium, which he did utilize over the w/e.  His diarrhea resolved and yesterday had 1 more solid stool. Altered taste/decreased appetite - everything is bitter.  He would eat more, it just all tastes bad.  When he does eat his stomach "rumbles."  He did purchase some Equate protein drinks and he drank some of them this weekend.  He plans to drink 1-2 daily to get more protein. Possible dehydration- He thought he was going to have to come to ER yesterday.  Felt dizzy/weak.  He did get PowerAid over the weekend and it tasted pretty good.  He pushed fluids all day yesterday and states his urine was almost clear in color. Pt states as the day went along yesterday, he felt better.   HA - Pt has experienced pain in back of head/neck.  He sees Dr. Sherwood Gambler today and may need MRI.   Pt is taking BP at home and most recently BP = 137/84.   He has been regularly checking his blood glucose at home also.  Most recently BG = 161.  He is on several meds for diabetes and takes them in the AM on empty stomach.  This, he feels, contributes to his nausea. SOB - occurs at rest.  He has experienced panic attacks and became SOB while at rest. Weakness/fatigue -  I reviewed his most recent labs from last  Tues (5/9).  They were essentially normal. I informed him I would pass along these comments to Dr. [name] and Ned Card, NP and if they feel he should be seen before 1 month appt (10/01/15) then he would receive a call back from our office to set that appt up.  Otherwise he understands when he should proceed to the ER. Kennith Center, Pharm.D., CPP 09/08/2015'@10'$ :25 AM [facility]

## 2015-09-08 NOTE — Progress Notes (Signed)
Prev notes incorrect. Still waiting on form from dr signed. Nothing has been faxed.

## 2015-09-08 NOTE — Progress Notes (Signed)
left at pod- left form for dr [name] to sign---mailed per coversheet and sent to medical recrds Correct notes from previous..mailed copy to patient

## 2015-09-10 ENCOUNTER — Encounter: Payer: Self-pay | Admitting: Skilled Nursing Facility1

## 2015-09-10 NOTE — Progress Notes (Signed)
Subjective:     Patient ID: Patrick Salt., male   DOB: 1945-09-13, 70 y.o.   MRN: [id number]  HPI   Review of Systems     Objective:   Physical Exam To assist the pt in identfiying some dietary strategies to gain lost wt.    Assessment:     Pt identified as being malnourished due to wt loss. Pt contacted via the telephone at [phone]. Pt was unavailable.    Plan:     Dietitian left a message prompting the pt to contact [facility] at [phone].

## 2015-09-14 ENCOUNTER — Other Ambulatory Visit: Payer: Medicare Other

## 2015-09-16 ENCOUNTER — Inpatient Hospital Stay: Admission: RE | Admit: 2015-09-16 | Payer: Medicare Other | Source: Ambulatory Visit

## 2015-09-22 ENCOUNTER — Encounter (HOSPITAL_COMMUNITY): Payer: Self-pay | Admitting: Emergency Medicine

## 2015-09-22 ENCOUNTER — Emergency Department (HOSPITAL_COMMUNITY)
Admission: EM | Admit: 2015-09-22 | Discharge: 2015-09-23 | Disposition: A | Discharge status: Home / Self Care | Payer: Medicare Other | Attending: Emergency Medicine | Admitting: Emergency Medicine

## 2015-09-22 ENCOUNTER — Emergency Department (HOSPITAL_COMMUNITY): Payer: Medicare Other

## 2015-09-22 DIAGNOSIS — Z7984 Long term (current) use of oral hypoglycemic drugs: Secondary | ICD-10-CM | POA: Diagnosis not present

## 2015-09-22 DIAGNOSIS — R0781 Pleurodynia: Secondary | ICD-10-CM

## 2015-09-22 DIAGNOSIS — I129 Hypertensive chronic kidney disease with stage 1 through stage 4 chronic kidney disease, or unspecified chronic kidney disease: Secondary | ICD-10-CM | POA: Diagnosis not present

## 2015-09-22 DIAGNOSIS — E119 Type 2 diabetes mellitus without complications: Secondary | ICD-10-CM | POA: Diagnosis not present

## 2015-09-22 DIAGNOSIS — R0789 Other chest pain: Secondary | ICD-10-CM | POA: Insufficient documentation

## 2015-09-22 DIAGNOSIS — C7800 Secondary malignant neoplasm of unspecified lung: Secondary | ICD-10-CM | POA: Diagnosis not present

## 2015-09-22 DIAGNOSIS — Z951 Presence of aortocoronary bypass graft: Secondary | ICD-10-CM | POA: Diagnosis not present

## 2015-09-22 DIAGNOSIS — C799 Secondary malignant neoplasm of unspecified site: Secondary | ICD-10-CM

## 2015-09-22 DIAGNOSIS — Z87891 Personal history of nicotine dependence: Secondary | ICD-10-CM | POA: Insufficient documentation

## 2015-09-22 DIAGNOSIS — E039 Hypothyroidism, unspecified: Secondary | ICD-10-CM | POA: Diagnosis not present

## 2015-09-22 DIAGNOSIS — R531 Weakness: Secondary | ICD-10-CM | POA: Diagnosis not present

## 2015-09-22 DIAGNOSIS — N189 Chronic kidney disease, unspecified: Secondary | ICD-10-CM | POA: Insufficient documentation

## 2015-09-22 DIAGNOSIS — F329 Major depressive disorder, single episode, unspecified: Secondary | ICD-10-CM | POA: Insufficient documentation

## 2015-09-22 DIAGNOSIS — C649 Malignant neoplasm of unspecified kidney, except renal pelvis: Secondary | ICD-10-CM | POA: Insufficient documentation

## 2015-09-22 DIAGNOSIS — I2510 Atherosclerotic heart disease of native coronary artery without angina pectoris: Secondary | ICD-10-CM | POA: Insufficient documentation

## 2015-09-22 DIAGNOSIS — Z7982 Long term (current) use of aspirin: Secondary | ICD-10-CM | POA: Diagnosis not present

## 2015-09-22 DIAGNOSIS — J449 Chronic obstructive pulmonary disease, unspecified: Secondary | ICD-10-CM | POA: Diagnosis not present

## 2015-09-22 DIAGNOSIS — R079 Chest pain, unspecified: Secondary | ICD-10-CM | POA: Diagnosis present

## 2015-09-22 LAB — URINALYSIS, ROUTINE W REFLEX MICROSCOPIC
Glucose, UA: NEGATIVE mg/dL
HGB URINE DIPSTICK: NEGATIVE
Ketones, ur: NEGATIVE mg/dL
Leukocytes, UA: NEGATIVE
Nitrite: NEGATIVE
PROTEIN: 30 mg/dL — AB
Specific Gravity, Urine: 1.031 — ABNORMAL HIGH (ref 1.005–1.030)
pH: 5 (ref 5.0–8.0)

## 2015-09-22 LAB — BASIC METABOLIC PANEL
Anion gap: 9 (ref 5–15)
BUN: 16 mg/dL (ref 6–20)
CALCIUM: 8.5 mg/dL — AB (ref 8.9–10.3)
CO2: 24 mmol/L (ref 22–32)
CREATININE: 1.05 mg/dL (ref 0.61–1.24)
Chloride: 104 mmol/L (ref 101–111)
GFR calc non Af Amer: >60 mL/min (ref >60)
GLUCOSE: 128 mg/dL — AB (ref 65–99)
Potassium: 4.5 mmol/L (ref 3.5–5.1)
Sodium: 137 mmol/L (ref 135–145)

## 2015-09-22 LAB — URINE MICROSCOPIC-ADD ON

## 2015-09-22 LAB — TROPONIN I: Troponin I: <0.03 ng/mL (ref <0.031)

## 2015-09-22 LAB — CBC
HCT: 34.5 % — ABNORMAL LOW (ref 39.0–52.0)
Hemoglobin: 11.6 g/dL — ABNORMAL LOW (ref 13.0–17.0)
MCH: 27.2 pg (ref 26.0–34.0)
MCHC: 33.6 g/dL (ref 30.0–36.0)
MCV: 80.8 fL (ref 78.0–100.0)
PLATELETS: 163 10*3/uL (ref 150–400)
RBC: 4.27 MIL/uL (ref 4.22–5.81)
RDW: 19.8 % — AB (ref 11.5–15.5)
WBC: 6.5 10*3/uL (ref 4.0–10.5)

## 2015-09-22 LAB — CBG MONITORING, ED: GLUCOSE-CAPILLARY: 125 mg/dL — AB (ref 65–99)

## 2015-09-22 MED ORDER — IOPAMIDOL (ISOVUE-370) INJECTION 76%
100.0000 mL | Freq: Once | INTRAVENOUS | Status: AC | PRN
  Administered 2015-09-22: 100 mL via INTRAVENOUS

## 2015-09-22 MED ORDER — SODIUM CHLORIDE 0.9 % IV BOLUS (SEPSIS)
1000.0000 mL | Freq: Once | INTRAVENOUS | Status: AC
  Administered 2015-09-22: 1000 mL via INTRAVENOUS

## 2015-09-22 NOTE — ED Notes (Signed)
Nurse is starting a IV on patient

## 2015-09-22 NOTE — ED Notes (Signed)
Pt states that he has had increased weakness over the past several days. Taking chemo pill every day x 2 month. C/O L sided chest/ lung pain. Hx of kidney CA that has returned in his lungs. Alert and oriented.

## 2015-09-22 NOTE — ED Notes (Signed)
EKG delayed due to patient being in restroom.

## 2015-09-22 NOTE — ED Provider Notes (Signed)
CSN: [id number]     Arrival date & time 09/22/15  1944 History   First MD Initiated Contact with Patient 09/22/15 2006     Chief Complaint  Patient presents with  . Cancer  . Weakness     (Consider location/radiation/quality/duration/timing/severity/associated sxs/prior Treatment) HPI Comments: 70yo M w/ extensive PMH including metastatic renal cell CA, AAA, CABG, T2DM, OSA who p/w chest pain and weakness. The patient states that he has had generalized weakness over the past few days. He reports that he has chronic mild shortness of breath, which has been worse with exertion. He reports that yesterday he began having sharp left-sided chest pain with deep inspiration. Today he has not had this sharp pain but does report a soreness when he takes a deep breath. He denies any fevers or vomiting. He has had some diarrhea since starting chemotherapy 2 months ago but no change. No blood in his stool. No urinary symptoms or abdominal pain. No cough/cold symptoms. He has had decreased appetite due to his taste being altered. No exertional chest pain.   Patient is a 70 y.o. male presenting with weakness. The history is provided by the patient.  Weakness    Past Medical History  Diagnosis Date  . Hypertension   . Hyperlipidemia   . Anxiety   . Asthma   . COPD (chronic obstructive pulmonary disease) ([place])   . Coronary artery disease   . Depression   . Heart murmur   . Hypothyroidism   . Sleep apnea     CPAP  . AAA (abdominal aortic aneurysm) ([place])   . GERD (gastroesophageal reflux disease)   . Headache   . Chronic kidney disease     Renal Cell Adenocarcinoma  . Chronic kidney disease     Left Nephrectomy  . Hyperparathyroidism ([place])   . Neuropathy ([place])   . Cancer [facility])     Renal Cell- 2013; Lung 2017  . Eczema   . HPTH (hyperparathyroidism) ([place])   . Thoracic aortic aneurysm ([place]) 2017  . Shortness of breath dyspnea   . Diabetes mellitus without complication ([place])     Type II  .  Encounter for antineoplastic chemotherapy 08/19/2015  . Hypertension 08/19/2015   Past Surgical History  Procedure Laterality Date  . Parathyroidectomy  12/17/09  . Coronary artery bypass graft      x 5  . Back surgery      compressed vertebrae-put in a plate to seprate the spaces  . Fracture surgery      left arm repair  . Knee surgery      left knee repair  . Cardiac catheterization    . Cervical fusion    . Endobronchial ultrasound N/A 06/30/2015    Procedure: ENDOHIAL ULTRASOUND;  Surgeon: Flora Lipps, MD;  Location: [facility] ORS;  Service: Cardiopulmonary;  Laterality: N/A;  . Video bronchoscopy with endobronchial ultrasound N/A 07/09/2015    Procedure: VIDEO BRONCHOSCOPY WITH ENDOBRONCHIAL ULTRASOUND;  Surgeon: Ivin Poot, MD;  Location: [facility] OR;  Service: Thoracic;  Laterality: N/A;   Family History  Problem Relation Age of Onset  . Stroke Mother   . Cardiomyopathy Mother   . Hypertension Mother   . Hypertension Father   . Congestive Heart Failure Father   . Heart disease Brother   . Heart attack Brother   . Hypertension Brother   . Diabetes Brother   . Obesity Brother   . Heart disease Brother   . Sleep apnea Son    Social History  Substance Use Topics  . Smoking status: Former Smoker -- 1.50 packs/day for 35 years    Types: Cigarettes    Quit date: 12/24/2001  . Smokeless tobacco: Never Used  . Alcohol Use: No    Review of Systems  Neurological: Positive for weakness.   10 Systems reviewed and are negative for acute change except as noted in the HPI.    Allergies  Amoxicillin; Codeine; Penicillins; and Ace inhibitors  Home Medications   Prior to Admission medications   Medication Sig Start Date End Date Taking? Authorizing Provider  ADVAIR DISKUS 100-50 MCG/DOSE AEPB INHALE 1 PUFF TWICE A DAY 08/08/15  Yes Richard Maceo Pro., MD  albuterol (PROVENTIL HFA;VENTOLIN HFA) 108 (90 BASE) MCG/ACT inhaler Inhale 1 puff into the lungs every 6 (six) hours as  needed for wheezing.    Yes Historical Provider, MD  ALPRAZolam (XANAX) 0.5 MG tablet TAKE ONE TABLET BY MOUTH TWICE DAILY AS NEEDED Patient taking differently: TAKE 0.5 MG BY MOUTH TWICE DAILY AS NEEDED FOR ANXIETY 03/05/15  Yes Richard Maceo Pro., MD  aspirin 81 MG tablet Take 81 mg by mouth at bedtime. Reported on 07/01/2015 05/27/11  Yes Historical Provider, MD  carvedilol (COREG) 25 MG tablet Take 1 tablet (25 mg total) by mouth 2 (two) times daily with a meal. 08/21/15  Yes [name]., MD  Cholecalciferol 1000 UNITS tablet Take 1,000 Units by mouth daily.    Yes Historical Provider, MD  FLUoxetine (PROZAC) 40 MG capsule TAKE ONE CAPSULE BY MOUTH EVERY DAY Patient taking differently: TAKE 40 MG BY MOUTH EVERY DAY 03/25/15  Yes Richard Maceo Pro., MD  losartan (COZAAR) 100 MG tablet TAKE ONE TABLET BY MOUTH EVERY DAY Patient taking differently: TAKE 100 MG BY MOUTH EVERY DAY 03/25/15  Yes [name]., MD  meclizine (ANTIVERT) 25 MG tablet Take 25 mg by mouth daily as needed for dizziness.  06/18/13  Yes Historical Provider, MD  metFORMIN (GLUCOPHAGE) 1000 MG tablet Take 1,000 mg by mouth 2 (two) times daily with a meal. Reported on 07/01/2015 09/11/14  Yes Historical Provider, MD  omeprazole (PRILOSEC) 20 MG capsule TAKE 1 CAPSULE BY MOUTH EVERY MORNING 08/21/15  Yes Richard Maceo Pro., MD  pazopanib (VOTRIENT) 200 MG tablet Take 4 tablets (800 mg total) by mouth daily. Take on an empty stomach. 08/06/15  Yes Curt Bears, MD  pioglitazone (ACTOS) 45 MG tablet TAKE ONE TABLET BY MOUTH EACH DAY. Patient taking differently: TAKE 45 MG BY MOUTH EACH DAY. 02/03/15  Yes Richard Maceo Pro., MD  simvastatin (ZOCOR) 40 MG tablet TAKE ONE TABLET BY MOUTH EVERY DAY 08/11/15  Yes Richard Maceo Pro., MD  naproxen (NAPROSYN) 500 MG tablet Take 1 tablet (500 mg total) by mouth 2 (two) times daily with a meal. Patient not taking: Reported on 09/22/2015 08/21/15   [name]., MD  prochlorperazine (COMPAZINE) 10 MG tablet Take 1 tablet (10 mg total) by mouth every 6 (six) hours as needed for nausea or vomiting. Patient not taking: Reported on 09/02/2015 08/06/15   Curt Bears, MD   BP 168/105 mmHg  Pulse 68  Temp(Src) 98.4 F (36.9 C) (Oral)  Resp 19  SpO2 98% Physical Exam  Constitutional: He is oriented to person, place, and time. He appears well-developed and well-nourished. No distress.  HENT:  Head: Normocephalic and atraumatic.  Moist mucous membranes  Eyes: Conjunctivae are normal. Pupils are equal, round, and reactive to light.  Neck: Neck supple.  Cardiovascular: Normal rate, regular rhythm and normal heart sounds.   No murmur heard. Pulmonary/Chest: Effort normal and breath sounds normal.  Abdominal: Soft. Bowel sounds are normal. He exhibits no distension. There is no tenderness.  Musculoskeletal: He exhibits no edema.  Neurological: He is alert and oriented to person, place, and time.  Fluent speech  Skin: Skin is warm and dry. There is pallor.  Psychiatric: He has a normal mood and affect. Judgment normal.  Nursing note and vitals reviewed.   ED Course  Procedures (including critical care time) Labs Review Labs Reviewed  BASIC METABOLIC PANEL - Abnormal; Notable for the following:    Glucose, Bld 128 (*)    Calcium 8.5 (*)    All other components within normal limits  CBC - Abnormal; Notable for the following:    Hemoglobin 11.6 (*)    HCT 34.5 (*)    RDW 19.8 (*)    All other components within normal limits  URINALYSIS, ROUTINE W REFLEX MICROSCOPIC (NOT AT [facility]) - Abnormal; Notable for the following:    APPearance CLOUDY (*)    Specific Gravity, Urine 1.031 (*)    Bilirubin Urine SMALL (*)    Protein, ur 30 (*)    All other components within normal limits  URINE MICROSCOPIC-ADD ON - Abnormal; Notable for the following:    Squamous Epithelial / LPF 0-5 (*)    Bacteria, UA FEW (*)    Casts HYALINE CASTS (*)    All other  components within normal limits  CBG MONITORING, ED - Abnormal; Notable for the following:    Glucose-Capillary 125 (*)    All other components within normal limits  TROPONIN I  Randolm Idol, ED    Imaging Review Dg Chest 2 View  09/22/2015  CLINICAL DATA:  Chest pain. EXAM: CHEST  2 VIEW COMPARISON:  July 22, 2015. FINDINGS: The heart size and mediastinal contours are within normal limits. Both lungs are clear. Status post coronary bypass graft. No pneumothorax or pleural effusion is noted. The visualized skeletal structures are unremarkable. IMPRESSION: No active cardiopulmonary disease. Electronically Signed   By: Marijo Conception, M.D.   On: 09/22/2015 21:00   Ct Angio Chest Pe W/cm &/or Wo Cm  09/23/2015  CLINICAL DATA:  Acute onset of left-sided chest pain and weakness. Shortness of breath. Current history of renal cancer, with lung metastases, on chemotherapy. Initial encounter. EXAM: CT ANGIOGRAPHY CHEST WITH CONTRAST TECHNIQUE: Multidetector CT imaging of the chest was performed using the standard protocol during bolus administration of intravenous contrast. Multiplanar CT image reconstructions and MIPs were obtained to evaluate the vascular anatomy. CONTRAST:  100 mL of Isovue 300 IV contrast COMPARISON:  PET/CT performed 06/24/2015 FINDINGS: There is no evidence of pulmonary embolus. A 4 cm spiculated mass is noted at the superior aspect of the left lower lobe. Minimal atelectasis or scarring is noted within the left lung. There is no evidence of significant focal consolidation, pleural effusion or pneumothorax. There is a multilobulated diffusely infiltrative mass at the aortopulmonary window region, measuring approximately 5.5 x 3.4 x 4.5 cm. This partially encases the left side of the tracheal and left mainstem bronchus, with vague soft tissue mass extending around the left main pulmonary artery, demonstrating mild mass effect on some of the branches. There are also enlarged nodes at  the right hilum and subcarinal region, measuring up to 1.7 cm in short axis. The ascending thoracic aorta measures up to 5.0 cm in AP dimension.  The mass abuts the inferior aspect of the aortic arch. Diffuse coronary artery calcifications are seen. The great vessels are grossly unremarkable in appearance. No pericardial effusion is identified. The patient is status post median sternotomy. No axillary lymphadenopathy is seen. The thyroid gland is unremarkable in appearance. The visualized portions of the liver and spleen are unremarkable. Cervical spinal fusion hardware is partially imaged. No acute osseous abnormalities are seen. Review of the MIP images confirms the above findings. IMPRESSION: 1. No evidence of pulmonary embolus. 2. Multilobulated diffusely infiltrating mass at the aortopulmonary window region, measuring 5.5 x 3.4 x 4.5 cm. This partially encases the left side of the trachea and left mainstem bronchus, with vague soft tissue mass extending around the left main pulmonary artery, demonstrating mild mass effect on some of the branches. The mass abuts the inferior aspect of the aortic arch. This is compatible with metastatic disease. 3. 4 cm spiculated mass at the superior aspect of the left lower lung lobe. Minimal atelectasis or scarring within the left lung. 4. Enlarged right hilar and subcarinal nodes, measuring up to 1.7 cm in short axis. 5. Ascending thoracic aorta measures up to 5.0 cm in AP dimension. Ascending thoracic aortic aneurysm. Recommend semi-annual imaging followup by CTA or MRA and referral to cardiothoracic surgery if not already obtained. This recommendation follows 2010 ACCF/AHA/AATS/ACR/ASA/SCA/SCAI/SIR/STS/SVM Guidelines for the Diagnosis and Management of Patients With Thoracic Aortic Disease. Circulation. 2010; 121: [id number] 6. Diffuse coronary artery calcifications seen. Electronically Signed   By: Garald Balding M.D.   On: 09/23/2015 00:13   I have personally reviewed and  evaluated these lab results as part of my medical decision-making.   EKG Interpretation   Date/Time:  Monday Sep 22 2015 21:38:49 EDT Ventricular Rate:  75 PR Interval:  121 QRS Duration: 92 QT Interval:  490 QTC Calculation: 547 R Axis:   24 Text Interpretation:  Sinus rhythm Abnormal R-wave progression, early  transition Prolonged QT interval prolonged QT new from previous, no other  significant changes Confirmed by LITTLE MD, RACHEL [phone]) on 09/22/2015  10:02:09 PM     Medications  sodium chloride 0.9 % bolus 1,000 mL (0 mLs Intravenous Stopped 09/23/15 0020)  iopamidol (ISOVUE-370) 76 % injection 100 mL (100 mLs Intravenous Contrast Given 09/22/15 2345)    MDM   Final diagnoses:  Pleuritic chest pain  Weakness  Metastatic cancer ([place])   Patient with history of metastatic renal cancer presents with generalized weakness for the past few days as well as chest pain with deep breathing that began yesterday. He was well-appearing at presentation with vital signs notable for mild hypertension. Normal O2 saturation on room air. No abdominal tenderness or leg swelling. Obtained above lab work including troponin. Chest x-ray negative for acute process. Because of the patient's metastatic cancer, obtained CTA of the chest to rule out PE.  UA shows mildly concentrated urine without evidence of infection, normal creatinine, CBC consistent with the patient's baseline cell counts. Serial troponins negative. CT shows a complex mass of left lung that encases left side of trachea and left mainstem bronchus with mass effect on some branches of left main pulmonary artery. Findings consistent with his known metastatic disease. Even that he reports that his chest pain is always left-sided and always on inspiration, I suspect that his pain is related to his metastatic disease. He states that he has felt improved after receiving IV fluids and has been able to ambulate here without difficulty. He is  breathing comfortably on room  air and the rest of his vital signs are reassuring, the fourth feel he is safe for discharge with close follow-up with his oncologist. He will contact his oncologist in the morning for follow-up to discuss further treatment. Instructed to discontinue metformin for 2 days after receiving contrast and emphasized importance of continued hydration. Return precautions extensively reviewed including any change in his pain, worsening symptoms, or fever. Patient voiced understanding and was discharged in satisfactory condition.  Sharlett Iles, MD 09/23/15 [phone]

## 2015-09-23 ENCOUNTER — Telehealth: Payer: Self-pay | Admitting: *Deleted

## 2015-09-23 LAB — I-STAT TROPONIN, ED: Troponin i, poc: 0 ng/mL (ref 0.00–0.08)

## 2015-09-23 NOTE — Telephone Encounter (Signed)
Returned call to pt- cell # unable to leave message Home # left message with wife.

## 2015-09-23 NOTE — Discharge Instructions (Signed)
PLEASE DO NOT RESUME METFORMIN UNTIL 2 HOURS AFTER YOUR CT SCAN.

## 2015-09-24 ENCOUNTER — Telehealth: Payer: Self-pay | Admitting: *Deleted

## 2015-09-24 ENCOUNTER — Ambulatory Visit (HOSPITAL_BASED_OUTPATIENT_CLINIC_OR_DEPARTMENT_OTHER): Payer: Medicare Other | Admitting: Internal Medicine

## 2015-09-24 ENCOUNTER — Ambulatory Visit
Admission: RE | Admit: 2015-09-24 | Discharge: 2015-09-24 | Disposition: A | Discharge status: Home / Self Care | Payer: Medicare Other | Source: Ambulatory Visit | Attending: Neurosurgery | Admitting: Neurosurgery

## 2015-09-24 ENCOUNTER — Encounter: Payer: Self-pay | Admitting: Internal Medicine

## 2015-09-24 ENCOUNTER — Telehealth: Payer: Self-pay | Admitting: Internal Medicine

## 2015-09-24 ENCOUNTER — Other Ambulatory Visit: Payer: Self-pay | Admitting: *Deleted

## 2015-09-24 VITALS — BP 145/80 | HR 70 | Temp 97.9°F | Resp 18 | Ht 71.0 in | Wt 259.3 lb

## 2015-09-24 DIAGNOSIS — M47812 Spondylosis without myelopathy or radiculopathy, cervical region: Secondary | ICD-10-CM

## 2015-09-24 DIAGNOSIS — C7931 Secondary malignant neoplasm of brain: Secondary | ICD-10-CM

## 2015-09-24 DIAGNOSIS — C642 Malignant neoplasm of left kidney, except renal pelvis: Secondary | ICD-10-CM

## 2015-09-24 DIAGNOSIS — Z5111 Encounter for antineoplastic chemotherapy: Secondary | ICD-10-CM

## 2015-09-24 MED ORDER — GADOBENATE DIMEGLUMINE 529 MG/ML IV SOLN
20.0000 mL | Freq: Once | INTRAVENOUS | Status: AC | PRN
  Administered 2015-09-24: 20 mL via INTRAVENOUS

## 2015-09-24 NOTE — Telephone Encounter (Signed)
Patient returning call from [facility] in reference to appt with Dr. [name] to discuss CT results. Ok per Dr. [name] to schedule appt with him today at 10:30. Patient called and aware of appt.

## 2015-09-24 NOTE — Telephone Encounter (Signed)
Gave pt apt & avs °

## 2015-09-24 NOTE — Progress Notes (Signed)
[place] Telephone:([phone]) [phone]   Fax:(336) [place], [place] [address]  DIAGNOSIS: metastatic renal cell carcinoma, clear cell type. This was initially diagnosed as a stage III status post left nephrectomy as well as left adrenalectomy on 03/06/2012. 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  PRIOR THERAPY: Stereotactic radiotherapy to the solitary brain lesion scheduled for 08/20/2015  CURRENT THERAPY: Votrient 800 mg by mouth daily started 08/08/2015.  INTERVAL HISTORY: Patrick Breed Sr. 70 y.o. male returns to the clinic today for follow-up visit. The patient was started on treatment with Votrient  and tolerating his treatment well with no specific complaints except for mild fatigue. He was complaining of chest pressure and shortness of breath yesterday and he was seen at the emergency department and CT angiogram of the chest was performed. It showed no evidence for pulmonary embolism and there is some evidence for improvement of his disease compared to the previous scan. He denied having any significant nausea, vomiting or constipation. He has no chest pain, shortness of breath, cough or hemoptysis. He is here today for evaluation and discussion of his scan results and further recommendation regarding his condition.  MEDICAL HISTORY: Past Medical History  Diagnosis Date  . Hypertension   . Hyperlipidemia   . Anxiety   . Asthma   . COPD (chronic obstructive pulmonary disease) ([place])   . Coronary artery disease   . Depression   . Heart murmur   . Hypothyroidism   . Sleep apnea     CPAP  . AAA (abdominal aortic aneurysm) ([place])   . GERD (gastroesophageal reflux disease)   . Headache   . Chronic kidney  disease     Renal Cell Adenocarcinoma  . Chronic kidney disease     Left Nephrectomy  . Hyperparathyroidism ([place])   . Neuropathy ([place])   . Cancer [facility])     Renal Cell- 2013; Lung 2017  . Eczema   . HPTH (hyperparathyroidism) ([place])   . Thoracic aortic aneurysm ([place]) 2017  . Shortness of breath dyspnea   . Diabetes mellitus without complication ([place])     Type II  . Encounter for antineoplastic chemotherapy 08/19/2015  . Hypertension 08/19/2015    ALLERGIES:  is allergic to amoxicillin; codeine; penicillins; and ace inhibitors.  MEDICATIONS:  Current Outpatient Prescriptions  Medication Sig Dispense Refill  . ADVAIR DISKUS 100-50 MCG/DOSE AEPB INHALE 1 PUFF TWICE A DAY 60 each 12  . albuterol (PROVENTIL HFA;VENTOLIN HFA) 108 (90 BASE) MCG/ACT inhaler Inhale 1 puff into the lungs every 6 (six) hours as needed for wheezing.     Marland Kitchen ALPRAZolam (XANAX) 0.5 MG tablet TAKE ONE TABLET BY MOUTH TWICE DAILY AS NEEDED (Patient taking differently: TAKE 0.5 MG BY MOUTH TWICE DAILY AS NEEDED FOR ANXIETY) 60 tablet 4  . aspirin 81 MG tablet Take 81 mg by mouth at bedtime. Reported on 07/01/2015    . carvedilol (COREG) 25 MG tablet Take 1 tablet (25 mg total) by mouth 2 (two) times daily with a meal. 60 tablet 3  . Cholecalciferol 1000 UNITS tablet Take 1,000 Units by mouth daily.     Marland Kitchen FLUoxetine (PROZAC) 40 MG capsule TAKE ONE CAPSULE BY MOUTH EVERY DAY (Patient taking differently: TAKE 40  MG BY MOUTH EVERY DAY) 30 capsule 12  . losartan (COZAAR) 100 MG tablet TAKE ONE TABLET BY MOUTH EVERY DAY (Patient taking differently: TAKE 100 MG BY MOUTH EVERY DAY) 30 tablet 12  . meclizine (ANTIVERT) 25 MG tablet Take 25 mg by mouth daily as needed for dizziness.     . naproxen (NAPROSYN) 500 MG tablet Take 1 tablet (500 mg total) by mouth 2 (two) times daily with a meal. 30 tablet 0  . omeprazole (PRILOSEC) 20 MG capsule TAKE 1 CAPSULE BY MOUTH EVERY MORNING 30 capsule 12  . pazopanib (VOTRIENT) 200 MG tablet  Take 4 tablets (800 mg total) by mouth daily. Take on an empty stomach. 120 tablet 2  . pioglitazone (ACTOS) 45 MG tablet TAKE ONE TABLET BY MOUTH EACH DAY. (Patient taking differently: TAKE 45 MG BY MOUTH EACH DAY.) 30 tablet 12  . prochlorperazine (COMPAZINE) 10 MG tablet Take 1 tablet (10 mg total) by mouth every 6 (six) hours as needed for nausea or vomiting. 30 tablet 0  . simvastatin (ZOCOR) 40 MG tablet TAKE ONE TABLET BY MOUTH EVERY DAY 30 tablet 12  . metFORMIN (GLUCOPHAGE) 1000 MG tablet Take 1,000 mg by mouth 2 (two) times daily with a meal. Reported on 09/24/2015     No current facility-administered medications for this visit.    SURGICAL HISTORY:  Past Surgical History  Procedure Laterality Date  . Parathyroidectomy  12/17/09  . Coronary artery bypass graft      x 5  . Back surgery      compressed vertebrae-put in a plate to seprate the spaces  . Fracture surgery      left arm repair  . Knee surgery      left knee repair  . Cardiac catheterization    . Cervical fusion    . Endobronchial ultrasound N/A 06/30/2015    Procedure: ENDOHIAL ULTRASOUND;  Surgeon: Flora Lipps, MD;  Location: [facility] ORS;  Service: Cardiopulmonary;  Laterality: N/A;  . Video bronchoscopy with endobronchial ultrasound N/A 07/09/2015    Procedure: VIDEO BRONCHOSCOPY WITH ENDOBRONCHIAL ULTRASOUND;  Surgeon: Ivin Poot, MD;  Location: [facility] OR;  Service: Thoracic;  Laterality: N/A;    REVIEW OF SYSTEMS:  Constitutional: positive for fatigue Eyes: negative Ears, nose, mouth, throat, and face: negative Respiratory: positive for dyspnea on exertion Cardiovascular: negative Gastrointestinal: negative Genitourinary:negative Integument/breast: negative Hematologic/lymphatic: negative Musculoskeletal:negative Neurological: negative Behavioral/Psych: negative Endocrine: negative Allergic/Immunologic: negative   PHYSICAL EXAMINATION: General appearance: alert, cooperative, fatigued and no  distress Head: Normocephalic, without obvious abnormality, atraumatic Neck: no adenopathy, no JVD, supple, symmetrical, trachea midline and thyroid not enlarged, symmetric, no tenderness/mass/nodules Lymph nodes: Cervical, supraclavicular, and axillary nodes normal. Resp: clear to auscultation bilaterally Back: symmetric, no curvature. ROM normal. No CVA tenderness. Cardio: regular rate and rhythm, S1, S2 normal, no murmur, click, rub or gallop GI: soft, non-tender; bowel sounds normal; no masses,  no organomegaly Extremities: extremities normal, atraumatic, no cyanosis or edema Neurologic: Alert and oriented X 3, normal strength and tone. Normal symmetric reflexes. Normal coordination and gait  ECOG PERFORMANCE STATUS: 1 - Symptomatic but completely ambulatory  Blood pressure 145/80, pulse 70, temperature 97.9 F (36.6 C), temperature source Oral, resp. rate 18, height '5\' 11"'$  (1.803 m), weight 259 lb 4.8 oz (117.618 kg), SpO2 99 %.  LABORATORY DATA: Lab Results  Component Value Date   WBC 6.5 09/22/2015   HGB 11.6* 09/22/2015   HCT 34.5* 09/22/2015   MCV 80.8 09/22/2015   PLT 163 09/22/2015  Chemistry      Component Value Date/Time   NA 137 09/22/2015 2059   NA 139 09/02/2015 1415   NA 138 11/21/2013   NA 140 01/19/2012 0834   K 4.5 09/22/2015 2059   K 4.7 09/02/2015 1415   K 4.0 01/19/2012 0834   CL 104 09/22/2015 2059   CL 103 01/19/2012 0834   CO2 24 09/22/2015 2059   CO2 26 09/02/2015 1415   CO2 28 01/19/2012 0834   BUN 16 09/22/2015 2059   BUN 19.3 09/02/2015 1415   BUN 17 11/21/2013   BUN 12 01/19/2012 0834   CREATININE 1.05 09/22/2015 2059   CREATININE 1.0 09/02/2015 1415   CREATININE 1.1 11/21/2013   CREATININE 0.84 01/19/2012 0834   GLU 159 11/21/2013      Component Value Date/Time   CALCIUM 8.5* 09/22/2015 2059   CALCIUM 9.8 09/02/2015 1415   CALCIUM 9.1 01/19/2012 0834   ALKPHOS 82 09/02/2015 1415   ALKPHOS 74 07/09/2015 1435   AST 23  09/02/2015 1415   AST 14* 07/09/2015 1435   ALT 19 09/02/2015 1415   ALT 10* 07/09/2015 1435   BILITOT 0.66 09/02/2015 1415   BILITOT 0.6 07/09/2015 1435       RADIOGRAPHIC STUDIES: Dg Chest 2 View  09/22/2015  CLINICAL DATA:  Chest pain. EXAM: CHEST  2 VIEW COMPARISON:  July 22, 2015. FINDINGS: The heart size and mediastinal contours are within normal limits. Both lungs are clear. Status post coronary bypass graft. No pneumothorax or pleural effusion is noted. The visualized skeletal structures are unremarkable. IMPRESSION: No active cardiopulmonary disease. Electronically Signed   By: Marijo Conception, M.D.   On: 09/22/2015 21:00   Ct Angio Chest Pe W/cm &/or Wo Cm  09/23/2015  CLINICAL DATA:  Acute onset of left-sided chest pain and weakness. Shortness of breath. Current history of renal cancer, with lung metastases, on chemotherapy. Initial encounter. EXAM: CT ANGIOGRAPHY CHEST WITH CONTRAST TECHNIQUE: Multidetector CT imaging of the chest was performed using the standard protocol during bolus administration of intravenous contrast. Multiplanar CT image reconstructions and MIPs were obtained to evaluate the vascular anatomy. CONTRAST:  100 mL of Isovue 300 IV contrast COMPARISON:  PET/CT performed 06/24/2015 FINDINGS: There is no evidence of pulmonary embolus. A 4 cm spiculated mass is noted at the superior aspect of the left lower lobe. Minimal atelectasis or scarring is noted within the left lung. There is no evidence of significant focal consolidation, pleural effusion or pneumothorax. There is a multilobulated diffusely infiltrative mass at the aortopulmonary window region, measuring approximately 5.5 x 3.4 x 4.5 cm. This partially encases the left side of the tracheal and left mainstem bronchus, with vague soft tissue mass extending around the left main pulmonary artery, demonstrating mild mass effect on some of the branches. There are also enlarged nodes at the right hilum and subcarinal  region, measuring up to 1.7 cm in short axis. The ascending thoracic aorta measures up to 5.0 cm in AP dimension. The mass abuts the inferior aspect of the aortic arch. Diffuse coronary artery calcifications are seen. The great vessels are grossly unremarkable in appearance. No pericardial effusion is identified. The patient is status post median sternotomy. No axillary lymphadenopathy is seen. The thyroid gland is unremarkable in appearance. The visualized portions of the liver and spleen are unremarkable. Cervical spinal fusion hardware is partially imaged. No acute osseous abnormalities are seen. Review of the MIP images confirms the above findings. IMPRESSION: 1. No evidence of pulmonary embolus.  2. Multilobulated diffusely infiltrating mass at the aortopulmonary window region, measuring 5.5 x 3.4 x 4.5 cm. This partially encases the left side of the trachea and left mainstem bronchus, with vague soft tissue mass extending around the left main pulmonary artery, demonstrating mild mass effect on some of the branches. The mass abuts the inferior aspect of the aortic arch. This is compatible with metastatic disease. 3. 4 cm spiculated mass at the superior aspect of the left lower lung lobe. Minimal atelectasis or scarring within the left lung. 4. Enlarged right hilar and subcarinal nodes, measuring up to 1.7 cm in short axis. 5. Ascending thoracic aorta measures up to 5.0 cm in AP dimension. Ascending thoracic aortic aneurysm. Recommend semi-annual imaging followup by CTA or MRA and referral to cardiothoracic surgery if not already obtained. This recommendation follows 2010 ACCF/AHA/AATS/ACR/ASA/SCA/SCAI/SIR/STS/SVM Guidelines for the Diagnosis and Management of Patients With Thoracic Aortic Disease. Circulation. 2010; 121: [id number] 6. Diffuse coronary artery calcifications seen. Electronically Signed   By: Garald Balding M.D.   On: 09/23/2015 00:13    ASSESSMENT AND PLAN: This is a very pleasant 70 years old  white male with metastatic renal cell carcinoma currently on treatment with Votrient 800 mg by mouth daily status post 6 weeks of treatment and he has been tolerating his treatment well except for mild fatigue. The recent CT scan of the chest showed evidence for improvement of his disease compared to the scan performed in February 2017. I discussed the scan results and showed the images to the patient today. I recommended for the patient to continue his treatment with the same dose for now. For the solitary brain metastasis, the patient underwent stereotactic radiotherapy under the care of Dr. Isidore Moos. He would come back for follow-up visit in 2 weeks for evaluation and management of any adverse effect of his treatment. The patient was advised to call immediately if he has any concerning symptoms in the interval. The patient voices understanding of current disease status and treatment options and is in agreement with the current care plan.  All questions were answered. The patient knows to call the clinic with any problems, questions or concerns. We can certainly see the patient much sooner if necessary.  Disclaimer: This note was dictated with voice recognition software. Similar sounding words can inadvertently be transcribed and may not be corrected upon review.

## 2015-09-25 ENCOUNTER — Other Ambulatory Visit: Payer: Self-pay | Admitting: Family Medicine

## 2015-09-26 ENCOUNTER — Other Ambulatory Visit: Payer: Self-pay | Admitting: Family Medicine

## 2015-09-26 MED ORDER — MOMETASONE FUROATE 0.1 % EX CREA: 1.0000 "application " | TOPICAL_CREAM | Freq: Every day | CUTANEOUS | Status: DC

## 2015-09-26 NOTE — Telephone Encounter (Signed)
[place] with 2 rf. Use daily as directed for up to 2 weeks out of the month.

## 2015-09-26 NOTE — Telephone Encounter (Signed)
Please review-aa 

## 2015-09-26 NOTE — Telephone Encounter (Signed)
Done, this may not be covered per epic-aa

## 2015-09-26 NOTE — Telephone Encounter (Signed)
Pt contacted office for refill request on the following medications:  Mometasone furoate Cream 0.1%.  [employer].  UU#[phone]/[id number]

## 2015-10-01 ENCOUNTER — Other Ambulatory Visit: Payer: Medicare Other

## 2015-10-01 ENCOUNTER — Ambulatory Visit: Payer: Self-pay | Admitting: Radiation Oncology

## 2015-10-01 ENCOUNTER — Ambulatory Visit: Payer: Medicare Other | Admitting: Internal Medicine

## 2015-10-01 ENCOUNTER — Ambulatory Visit: Admission: RE | Admit: 2015-10-01 | Payer: Medicare Other | Source: Ambulatory Visit | Admitting: Radiation Oncology

## 2015-10-02 ENCOUNTER — Telehealth: Payer: Self-pay | Admitting: Pharmacist

## 2015-10-02 MED FILL — *VOTRIENT 200 MG TABLET: 200 | 30 days supply | Qty: 120 | Fill #2

## 2015-10-02 NOTE — Telephone Encounter (Signed)
Oral Chemotherapy Follow-Up Form  Original Start date of oral chemotherapy: 08/08/15  Called patient today to follow up regarding patient's oral chemotherapy medication: Votrient  Pt reports 1 tablets/doses missed in the last month.  Missed dose(s) attributed to: failed to pick up refill on time.  Pt reports the following side effects:  Nausea - improved.  Not using antiemetic currently Diarrhea - improved. Dehydration - had episode on 09/22/15 & went to ED.  Received IVF.  He is drinking PowerAid and trying to increase fluid intake overall. Fatigue - he is having a good week so far this week. Altered taste - he eats whether it tastes good or not.  He has gained 8 lbs recently.  Last CT scan showed improvement in disease so he's encouraged that his tx is effective.  He is almost out of Votrient.  I contacted [facility] for a refill and they will have it ready tomorrow.  Pt states he has 1 week supply left currently.  Pt is back for labs/MD visit on 10/08/15.  Will follow up and call patient again in 1 month.  Thank you, Kennith Center, Pharm.D., CPP 10/02/2015'@2'$ :10 PM Oral Chemotherapy Clinic

## 2015-10-08 ENCOUNTER — Other Ambulatory Visit: Payer: Self-pay | Admitting: Internal Medicine

## 2015-10-08 ENCOUNTER — Telehealth: Payer: Self-pay | Admitting: *Deleted

## 2015-10-08 ENCOUNTER — Telehealth: Payer: Self-pay | Admitting: Internal Medicine

## 2015-10-08 ENCOUNTER — Other Ambulatory Visit (HOSPITAL_BASED_OUTPATIENT_CLINIC_OR_DEPARTMENT_OTHER): Payer: Medicare Other

## 2015-10-08 ENCOUNTER — Ambulatory Visit (HOSPITAL_BASED_OUTPATIENT_CLINIC_OR_DEPARTMENT_OTHER): Payer: Medicare Other | Admitting: Internal Medicine

## 2015-10-08 ENCOUNTER — Encounter: Payer: Self-pay | Admitting: Internal Medicine

## 2015-10-08 DIAGNOSIS — R5383 Other fatigue: Secondary | ICD-10-CM

## 2015-10-08 DIAGNOSIS — R591 Generalized enlarged lymph nodes: Secondary | ICD-10-CM

## 2015-10-08 DIAGNOSIS — C7931 Secondary malignant neoplasm of brain: Secondary | ICD-10-CM | POA: Diagnosis not present

## 2015-10-08 DIAGNOSIS — C642 Malignant neoplasm of left kidney, except renal pelvis: Secondary | ICD-10-CM

## 2015-10-08 DIAGNOSIS — R918 Other nonspecific abnormal finding of lung field: Secondary | ICD-10-CM

## 2015-10-08 LAB — COMPREHENSIVE METABOLIC PANEL
ALT: 133 U/L — AB (ref 0–55)
ANION GAP: 10 meq/L (ref 3–11)
AST: 116 U/L — ABNORMAL HIGH (ref 5–34)
Albumin: 3.5 g/dL (ref 3.5–5.0)
Alkaline Phosphatase: 69 U/L (ref 40–150)
BILIRUBIN TOTAL: 1.28 mg/dL — AB (ref 0.20–1.20)
BUN: 15.6 mg/dL (ref 7.0–26.0)
CALCIUM: 8.9 mg/dL (ref 8.4–10.4)
CO2: 25 meq/L (ref 22–29)
CREATININE: 1.1 mg/dL (ref 0.7–1.3)
Chloride: 103 mEq/L (ref 98–109)
EGFR: 68 mL/min/{1.73_m2} — ABNORMAL LOW (ref >90)
Glucose: 130 mg/dl (ref 70–140)
Potassium: 4.3 mEq/L (ref 3.5–5.1)
Sodium: 139 mEq/L (ref 136–145)
TOTAL PROTEIN: 6.8 g/dL (ref 6.4–8.3)

## 2015-10-08 LAB — CBC WITH DIFFERENTIAL/PLATELET
BASO%: 0.4 % (ref 0.0–2.0)
Basophils Absolute: 0 10*3/uL (ref 0.0–0.1)
EOS%: 7.2 % — ABNORMAL HIGH (ref 0.0–7.0)
Eosinophils Absolute: 0.4 10*3/uL (ref 0.0–0.5)
HEMATOCRIT: 37.4 % — AB (ref 38.4–49.9)
HGB: 12.1 g/dL — ABNORMAL LOW (ref 13.0–17.1)
LYMPH#: 1.5 10*3/uL (ref 0.9–3.3)
LYMPH%: 26.7 % (ref 14.0–49.0)
MCH: 27.7 pg (ref 27.2–33.4)
MCHC: 32.4 g/dL (ref 32.0–36.0)
MCV: 85.6 fL (ref 79.3–98.0)
MONO#: 0.4 10*3/uL (ref 0.1–0.9)
MONO%: 7.2 % (ref 0.0–14.0)
NEUT%: 58.5 % (ref 39.0–75.0)
NEUTROS ABS: 3.3 10*3/uL (ref 1.5–6.5)
PLATELETS: 121 10*3/uL — AB (ref 140–400)
RBC: 4.37 10*6/uL (ref 4.20–5.82)
RDW: 21.8 % — ABNORMAL HIGH (ref 11.0–14.6)
WBC: 5.7 10*3/uL (ref 4.0–10.3)

## 2015-10-08 NOTE — Progress Notes (Signed)
[facility] Telephone:([phone]) [phone]   Fax:([phone]) [place], [place] 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 This was initially diagnosed as a stage III status post left nephrectomy as well as left adrenalectomy on 03/06/2012. 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  PRIOR THERAPY: Stereotactic radiotherapy to the solitary brain lesion scheduled for 08/20/2015  CURRENT THERAPY: Votrient 800 mg by mouth daily started 08/08/2015. His dose will be reduced to Votrient 600 mg by mouth daily starting 10/09/2015.  INTERVAL HISTORY: Patrick Breed Sr. 70 y.o. male returns to the clinic today for follow-up visit. The patient was started on treatment with Votrient  and tolerating his treatment well with no specific complaints except for persistent fatigue. He also has some dizzy spells and weakness. He denied having any significant nausea, vomiting or constipation. He has no chest pain, shortness of breath, cough or hemoptysis. He 0 today for evaluation and repeat blood work. He is expected to have surgery of the cervical spine under the care of Dr. Sherwood Gambler for degenerative disc disease in the next few months.  MEDICAL HISTORY: Past Medical History  Diagnosis Date  . Hypertension   . Hyperlipidemia   . Anxiety   . Asthma   . COPD (chronic obstructive pulmonary disease) ([place])   . Coronary artery disease   . Depression   . Heart murmur   . Hypothyroidism   . Sleep apnea     CPAP  . AAA (abdominal aortic aneurysm) ([place])   . GERD (gastroesophageal reflux disease)   . Headache   . Chronic kidney disease     Renal Cell Adenocarcinoma  . Chronic kidney disease     Left  Nephrectomy  . Hyperparathyroidism ([place])   . Neuropathy ([place])   . Cancer [facility])     Renal Cell- 2013; Lung 2017  . Eczema   . HPTH (hyperparathyroidism) ([place])   . Thoracic aortic aneurysm ([place]) 2017  . Shortness of breath dyspnea   . Diabetes mellitus without complication ([place])     Type II  . Encounter for antineoplastic chemotherapy 08/19/2015  . Hypertension 08/19/2015    ALLERGIES:  is allergic to amoxicillin; codeine; penicillins; and ace inhibitors.  MEDICATIONS:  Current Outpatient Prescriptions  Medication Sig Dispense Refill  . ADVAIR DISKUS 100-50 MCG/DOSE AEPB INHALE 1 PUFF TWICE A DAY 60 each 12  . albuterol (PROVENTIL HFA;VENTOLIN HFA) 108 (90 BASE) MCG/ACT inhaler Inhale 1 puff into the lungs every 6 (six) hours as needed for wheezing.     Marland Kitchen ALPRAZolam (XANAX) 0.5 MG tablet TAKE ONE TABLET BY MOUTH TWICE DAILY AS NEEDED 60 tablet 5  . aspirin 81 MG tablet Take 81 mg by mouth at bedtime. Reported on 07/01/2015    . carvedilol (COREG) 25 MG tablet Take 1 tablet (25 mg total) by mouth 2 (two) times daily with a meal. 60 tablet 3  . Cholecalciferol 1000 UNITS tablet Take 1,000 Units by mouth daily.     Marland Kitchen losartan (COZAAR) 100 MG tablet TAKE ONE TABLET BY MOUTH EVERY DAY (Patient taking differently: TAKE 100 MG BY MOUTH EVERY DAY) 30 tablet 12  . meclizine (ANTIVERT) 25 MG tablet Take 25 mg by mouth daily  as needed for dizziness.     . metFORMIN (GLUCOPHAGE) 1000 MG tablet TAKE ONE TABLET BY MOUTH TWICE DAILY 60 tablet 12  . mometasone (ELOCON) 0.1 % cream Apply 1 application topically daily. Use daily as directed for up 2 weeks out of the month 45 g 2  . naproxen (NAPROSYN) 500 MG tablet Take 1 tablet (500 mg total) by mouth 2 (two) times daily with a meal. 30 tablet 0  . omeprazole (PRILOSEC) 20 MG capsule TAKE 1 CAPSULE BY MOUTH EVERY MORNING 30 capsule 12  . pazopanib (VOTRIENT) 200 MG tablet Take 4 tablets (800 mg total) by mouth daily. Take on an empty stomach. 120 tablet 2    . pioglitazone (ACTOS) 45 MG tablet TAKE ONE TABLET BY MOUTH EACH DAY. (Patient taking differently: TAKE 45 MG BY MOUTH EACH DAY.) 30 tablet 12  . prochlorperazine (COMPAZINE) 10 MG tablet Take 1 tablet (10 mg total) by mouth every 6 (six) hours as needed for nausea or vomiting. 30 tablet 0  . simvastatin (ZOCOR) 40 MG tablet TAKE ONE TABLET BY MOUTH EVERY DAY 30 tablet 12  . FLUoxetine (PROZAC) 40 MG capsule TAKE ONE CAPSULE BY MOUTH EVERY DAY (Patient taking differently: TAKE 40 MG BY MOUTH EVERY DAY) 30 capsule 12   No current facility-administered medications for this visit.    SURGICAL HISTORY:  Past Surgical History  Procedure Laterality Date  . Parathyroidectomy  12/17/09  . Coronary artery bypass graft      x 5  . Back surgery      compressed vertebrae-put in a plate to seprate the spaces  . Fracture surgery      left arm repair  . Knee surgery      left knee repair  . Cardiac catheterization    . Cervical fusion    . Endobronchial ultrasound N/A 06/30/2015    Procedure: ENDOHIAL ULTRASOUND;  Surgeon: Flora Lipps, MD;  Location: [facility] ORS;  Service: Cardiopulmonary;  Laterality: N/A;  . Video bronchoscopy with endobronchial ultrasound N/A 07/09/2015    Procedure: VIDEO BRONCHOSCOPY WITH ENDOBRONCHIAL ULTRASOUND;  Surgeon: Ivin Poot, MD;  Location: [facility] OR;  Service: Thoracic;  Laterality: N/A;    REVIEW OF SYSTEMS:  A comprehensive review of systems was negative except for: Constitutional: positive for fatigue Neurological: positive for dizziness and weakness   PHYSICAL EXAMINATION: General appearance: alert, cooperative, fatigued and no distress Head: Normocephalic, without obvious abnormality, atraumatic Neck: no adenopathy, no JVD, supple, symmetrical, trachea midline and thyroid not enlarged, symmetric, no tenderness/mass/nodules Lymph nodes: Cervical, supraclavicular, and axillary nodes normal. Resp: clear to auscultation bilaterally Back: symmetric, no curvature.  ROM normal. No CVA tenderness. Cardio: regular rate and rhythm, S1, S2 normal, no murmur, click, rub or gallop GI: soft, non-tender; bowel sounds normal; no masses,  no organomegaly Extremities: extremities normal, atraumatic, no cyanosis or edema Neurologic: Alert and oriented X 3, normal strength and tone. Normal symmetric reflexes. Normal coordination and gait  ECOG PERFORMANCE STATUS: 1 - Symptomatic but completely ambulatory  Blood pressure 143/84, pulse 72, temperature 97.5 F (36.4 C), temperature source Oral, resp. rate 19, weight 252 lb 6.4 oz (114.488 kg), SpO2 96 %.  LABORATORY DATA: Lab Results  Component Value Date   WBC 5.7 10/08/2015   HGB 12.1* 10/08/2015   HCT 37.4* 10/08/2015   MCV 85.6 10/08/2015   PLT 121* 10/08/2015      Chemistry      Component Value Date/Time   NA 137 09/22/2015 2059   NA  139 09/02/2015 1415   NA 138 11/21/2013   NA 140 01/19/2012 0834   K 4.5 09/22/2015 2059   K 4.7 09/02/2015 1415   K 4.0 01/19/2012 0834   CL 104 09/22/2015 2059   CL 103 01/19/2012 0834   CO2 24 09/22/2015 2059   CO2 26 09/02/2015 1415   CO2 28 01/19/2012 0834   BUN 16 09/22/2015 2059   BUN 19.3 09/02/2015 1415   BUN 17 11/21/2013   BUN 12 01/19/2012 0834   CREATININE 1.05 09/22/2015 2059   CREATININE 1.0 09/02/2015 1415   CREATININE 1.1 11/21/2013   CREATININE 0.84 01/19/2012 0834   GLU 159 11/21/2013      Component Value Date/Time   CALCIUM 8.5* 09/22/2015 2059   CALCIUM 9.8 09/02/2015 1415   CALCIUM 9.1 01/19/2012 0834   ALKPHOS 82 09/02/2015 1415   ALKPHOS 74 07/09/2015 1435   AST 23 09/02/2015 1415   AST 14* 07/09/2015 1435   ALT 19 09/02/2015 1415   ALT 10* 07/09/2015 1435   BILITOT 0.66 09/02/2015 1415   BILITOT 0.6 07/09/2015 1435       RADIOGRAPHIC STUDIES: Dg Chest 2 View  09/22/2015  CLINICAL DATA:  Chest pain. EXAM: CHEST  2 VIEW COMPARISON:  July 22, 2015. FINDINGS: The heart size and mediastinal contours are within normal limits.  Both lungs are clear. Status post coronary bypass graft. No pneumothorax or pleural effusion is noted. The visualized skeletal structures are unremarkable. IMPRESSION: No active cardiopulmonary disease. Electronically Signed   By: Marijo Conception, M.D.   On: 09/22/2015 21:00   Ct Angio Chest Pe W/cm &/or Wo Cm  09/23/2015  CLINICAL DATA:  Acute onset of left-sided chest pain and weakness. Shortness of breath. Current history of renal cancer, with lung metastases, on chemotherapy. Initial encounter. EXAM: CT ANGIOGRAPHY CHEST WITH CONTRAST TECHNIQUE: Multidetector CT imaging of the chest was performed using the standard protocol during bolus administration of intravenous contrast. Multiplanar CT image reconstructions and MIPs were obtained to evaluate the vascular anatomy. CONTRAST:  100 mL of Isovue 300 IV contrast COMPARISON:  PET/CT performed 06/24/2015 FINDINGS: There is no evidence of pulmonary embolus. A 4 cm spiculated mass is noted at the superior aspect of the left lower lobe. Minimal atelectasis or scarring is noted within the left lung. There is no evidence of significant focal consolidation, pleural effusion or pneumothorax. There is a multilobulated diffusely infiltrative mass at the aortopulmonary window region, measuring approximately 5.5 x 3.4 x 4.5 cm. This partially encases the left side of the tracheal and left mainstem bronchus, with vague soft tissue mass extending around the left main pulmonary artery, demonstrating mild mass effect on some of the branches. There are also enlarged nodes at the right hilum and subcarinal region, measuring up to 1.7 cm in short axis. The ascending thoracic aorta measures up to 5.0 cm in AP dimension. The mass abuts the inferior aspect of the aortic arch. Diffuse coronary artery calcifications are seen. The great vessels are grossly unremarkable in appearance. No pericardial effusion is identified. The patient is status post median sternotomy. No axillary  lymphadenopathy is seen. The thyroid gland is unremarkable in appearance. The visualized portions of the liver and spleen are unremarkable. Cervical spinal fusion hardware is partially imaged. No acute osseous abnormalities are seen. Review of the MIP images confirms the above findings. IMPRESSION: 1. No evidence of pulmonary embolus. 2. Multilobulated diffusely infiltrating mass at the aortopulmonary window region, measuring 5.5 x 3.4 x 4.5 cm. This  partially encases the left side of the trachea and left mainstem bronchus, with vague soft tissue mass extending around the left main pulmonary artery, demonstrating mild mass effect on some of the branches. The mass abuts the inferior aspect of the aortic arch. This is compatible with metastatic disease. 3. 4 cm spiculated mass at the superior aspect of the left lower lung lobe. Minimal atelectasis or scarring within the left lung. 4. Enlarged right hilar and subcarinal nodes, measuring up to 1.7 cm in short axis. 5. Ascending thoracic aorta measures up to 5.0 cm in AP dimension. Ascending thoracic aortic aneurysm. Recommend semi-annual imaging followup by CTA or MRA and referral to cardiothoracic surgery if not already obtained. This recommendation follows 2010 ACCF/AHA/AATS/ACR/ASA/SCA/SCAI/SIR/STS/SVM Guidelines for the Diagnosis and Management of Patients With Thoracic Aortic Disease. Circulation. 2010; 121: [id number] 6. Diffuse coronary artery calcifications seen. Electronically Signed   By: Garald Balding M.D.   On: 09/23/2015 00:13   Mr Cervical Spine W Wo Contrast  09/24/2015  CLINICAL DATA:  Bilateral neck and shoulder pain for greater than 1 month. Remote cervical fusion. Recent diagnosis of lung cancer. The patient is currently undergoing chemotherapy. EXAM: MRI CERVICAL SPINE WITHOUT AND WITH CONTRAST TECHNIQUE: Multiplanar and multiecho pulse sequences of the cervical spine, to include the craniocervical junction and cervicothoracic junction, were  obtained without and with intravenous contrast. CONTRAST:  71m MULTIHANCE GADOBENATE DIMEGLUMINE 529 MG/ML IV SOLN COMPARISON:  Cervical spine radiographs 08/19/2015. FINDINGS: Alignment: Alignment is anatomic. There is some straightening of the normal cervical lordosis. Vertebrae: Anterior fusion is noted at C6-7. Mild endplate marrow changes are present at C4-5 and to a greater extent at C5-6. Cord: No abnormal cord signal is present in the cervical and upper thoracic spinal cord to the lowest imaged level, T2-3. Posterior Fossa, vertebral arteries, paraspinal tissues: The craniocervical junction is within normal limits. The visualized intracranial contents are normal. Flow is present in the vertebral arteries bilaterally. Disc levels: C2-3: Moderate facet hypertrophy is worse on the left. Severe left and moderate right foraminal narrowing is present. The central canal is patent. C3-4: A broad-based disc osteophyte complex effaces the ventral CSF. There is potential contact the ventral surface of the cord. Moderate foraminal stenosis is present bilaterally. C4-5: A broad-based disc osteophyte complex effaces ventral CSF and likely contacts the ventral surface the cord. There is flattening of the left side of the cord. The canal is narrowed to 9.5 mm. Moderate foraminal stenosis is present bilaterally. C5-6: A broad-based disc osteophyte complex is present. There is effacement of the ventral CSF and distortion of the ventral surface the cord. Moderate foraminal stenosis is present bilaterally. C6-7: Anterior fusion is solid. The central canal and foramina are decompressed bilaterally. C7-T1: Mild facet hypertrophy is worse on the right. There is no significant stenosis. IMPRESSION: 1. Solid anterior fusion at C6-7 without residual or recurrent stenosis. 2. Adjacent level disease at C5-6 with a broad-based disc osteophyte complex resulting and moderate central and bilateral foraminal stenosis. 3. Moderate left  central canal stenosis and bilateral foraminal narrowing at C4-5. 4. Mild central and moderate bilateral foraminal stenosis at C3-4. 5. Severe left and moderate right foraminal narrowing at C2-3. The central canal is patent at this level. Electronically Signed   By: CSan MorelleM.D.   On: 09/24/2015 16:32    ASSESSMENT AND PLAN: This is a very pleasant 70years old white male with metastatic renal cell carcinoma currently on treatment with Votrient 800 mg by mouth daily status post  2 months of treatment and he has been tolerating his treatment well except for persistent fatigue. I recommended for the patient to continue his treatment with Votrient but I will reduce the dose to 600 mg by mouth daily because of the persistent fatigue. For the solitary brain metastasis, the patient underwent stereotactic radiotherapy under the care of Dr. Isidore Moos. He may need repeat MRI of the brain soon to rule out any other abnormality especially with his dizzy spells and weakness. He would come back for follow-up visit in one month for evaluation and management of any adverse effect of his treatment. The patient was advised to call immediately if he has any concerning symptoms in the interval. The patient voices understanding of current disease status and treatment options and is in agreement with the current care plan.  All questions were answered. The patient knows to call the clinic with any problems, questions or concerns. We can certainly see the patient much sooner if necessary.  Disclaimer: This note was dictated with voice recognition software. Similar sounding words can inadvertently be transcribed and may not be corrected upon review.

## 2015-10-08 NOTE — Telephone Encounter (Signed)
Returned call to pt confirmed todays appt with MD and lab. Pt states " I'm in the car and on my way" No further concerns.

## 2015-10-08 NOTE — Telephone Encounter (Signed)
Gave and printed appt sched and avs for pt for July  °

## 2015-10-15 ENCOUNTER — Encounter: Payer: Self-pay | Admitting: Family Medicine

## 2015-10-15 ENCOUNTER — Ambulatory Visit (INDEPENDENT_AMBULATORY_CARE_PROVIDER_SITE_OTHER): Payer: Medicare Other | Admitting: Family Medicine

## 2015-10-15 VITALS — BP 110/72 | HR 68 | Temp 97.6°F | Resp 16 | Wt 252.0 lb

## 2015-10-15 DIAGNOSIS — C7931 Secondary malignant neoplasm of brain: Secondary | ICD-10-CM

## 2015-10-15 DIAGNOSIS — E119 Type 2 diabetes mellitus without complications: Secondary | ICD-10-CM | POA: Diagnosis not present

## 2015-10-15 DIAGNOSIS — R490 Dysphonia: Secondary | ICD-10-CM

## 2015-10-15 DIAGNOSIS — C642 Malignant neoplasm of left kidney, except renal pelvis: Secondary | ICD-10-CM | POA: Diagnosis not present

## 2015-10-15 DIAGNOSIS — Z5111 Encounter for antineoplastic chemotherapy: Secondary | ICD-10-CM | POA: Diagnosis not present

## 2015-10-15 DIAGNOSIS — E669 Obesity, unspecified: Secondary | ICD-10-CM | POA: Diagnosis not present

## 2015-10-15 LAB — POCT GLYCOSYLATED HEMOGLOBIN (HGB A1C): Hemoglobin A1C: 7.5

## 2015-10-15 MED ORDER — OMEPRAZOLE 20 MG PO CPDR: 20.0000 mg | DELAYED_RELEASE_CAPSULE | Freq: Every morning | ORAL | Status: DC

## 2015-10-15 NOTE — Progress Notes (Signed)
Patient ID: Patrick Salt., male   DOB: 02-22-1946, 70 y.o.   MRN: [id number]    Subjective:  HPI  Diabetes Mellitus Type II, Follow-up:   Lab Results  Component Value Date   HGBA1C 7.5 12/09/2014   HGBA1C 8.4* 08/01/2014    Last seen for diabetes 12 months ago for this problem.  Management since then includes none. He reports good compliance with treatment. He is not having side effects.  Home blood sugar records: 140-150  Episodes of hypoglycemia? no   Current Insulin Regimen: n/a Current exercise: none  Pertinent Labs:    Component Value Date/Time   CHOL 135 11/21/2013   TRIG 165* 11/21/2013   HDL 38 11/21/2013   LDLCALC 64 11/21/2013   CREATININE 1.1 10/08/2015 1333   CREATININE 1.05 09/22/2015 2059   CREATININE 1.1 11/21/2013   CREATININE 0.84 01/19/2012 0834    Wt Readings from Last 3 Encounters:  10/08/15 252 lb 6.4 oz (114.488 kg)  09/24/15 259 lb 4.8 oz (117.618 kg)  09/02/15 264 lb 9.6 oz (120.022 kg)   Since beginning chemotherapy for metastatic renal cell carcinoma the only through the taste good to the patient as sweets. He is having no hypoglycemia and no significant hyperglycemia. His  only new complaint is one of hoarseness. On further review of systems he does have some problems with unsteady /balance/proprioception issues. According the patient is also started about the time of beginning chemotherapy. He also has chronic fatigue which is a little bit better since Dr. Inda Merlin cut back on this dose of his chemotherapy. ------------------------------------------------------------------------    Prior to Admission medications   Medication Sig Start Date End Date Taking? Authorizing Provider  ADVAIR DISKUS 100-50 MCG/DOSE AEPB INHALE 1 PUFF TWICE A DAY 08/08/15  Yes Richard Maceo Pro., MD  albuterol (PROVENTIL HFA;VENTOLIN HFA) 108 (90 BASE) MCG/ACT inhaler Inhale 1 puff into the lungs every 6 (six) hours as needed for wheezing.    Yes Historical  Provider, MD  ALPRAZolam Duanne Moron) 0.5 MG tablet TAKE ONE TABLET BY MOUTH TWICE DAILY AS NEEDED 09/25/15  Yes [name]., MD  aspirin 81 MG tablet Take 81 mg by mouth at bedtime. Reported on 07/01/2015 05/27/11  Yes Historical Provider, MD  carvedilol (COREG) 25 MG tablet Take 1 tablet (25 mg total) by mouth 2 (two) times daily with a meal. 08/21/15  Yes [name]., MD  Cholecalciferol 1000 UNITS tablet Take 1,000 Units by mouth daily.    Yes Historical Provider, MD  FLUoxetine (PROZAC) 40 MG capsule TAKE ONE CAPSULE BY MOUTH EVERY DAY Patient taking differently: TAKE 40 MG BY MOUTH EVERY DAY 03/25/15  Yes Richard Maceo Pro., MD  losartan (COZAAR) 100 MG tablet TAKE ONE TABLET BY MOUTH EVERY DAY Patient taking differently: TAKE 100 MG BY MOUTH EVERY DAY 03/25/15  Yes [name]., MD  meclizine (ANTIVERT) 25 MG tablet Take 25 mg by mouth daily as needed for dizziness.  06/18/13  Yes Historical Provider, MD  metFORMIN (GLUCOPHAGE) 1000 MG tablet TAKE ONE TABLET BY MOUTH TWICE DAILY 09/25/15  Yes Richard Maceo Pro., MD  mometasone (ELOCON) 0.1 % cream Apply 1 application topically daily. Use daily as directed for up 2 weeks out of the month 09/26/15  Yes Richard L Cranford Mon., MD  naproxen (NAPROSYN) 500 MG tablet Take 1 tablet (500 mg total) by mouth 2 (two) times daily with a meal. 08/21/15  Yes [name]., MD  pazopanib (  VOTRIENT) 200 MG tablet Take 4 tablets (800 mg total) by mouth daily. Take on an empty stomach. Patient taking differently: Take 800 mg by mouth daily. Take on an empty stomach. 08/06/15  Yes Curt Bears, MD  pioglitazone (ACTOS) 45 MG tablet TAKE ONE TABLET BY MOUTH EACH DAY. Patient taking differently: TAKE 45 MG BY MOUTH EACH DAY. 02/03/15  Yes Richard Maceo Pro., MD  prochlorperazine (COMPAZINE) 10 MG tablet TAKE ONE TABLET BY MOUTH EVERY SIX HOURSAS NEEDED FOR NAUSEA OR VOMITING 10/08/15  Yes Curt Bears, MD  simvastatin (ZOCOR) 40  MG tablet TAKE ONE TABLET BY MOUTH EVERY DAY 08/11/15  Yes Richard Maceo Pro., MD  omeprazole (PRILOSEC) 20 MG capsule TAKE 1 CAPSULE BY MOUTH EVERY MORNING Patient not taking: Reported on 10/15/2015 08/21/15   [name]., MD    Patient Active Problem List   Diagnosis Date Noted  . Encounter for antineoplastic chemotherapy 08/19/2015  . Hypertension 08/19/2015  . Brain metastasis ([place]) 08/18/2015  . Chronic obstructive pulmonary disease ([place]) 02/13/2015  . Personal history of other malignant neoplasm of kidney 02/13/2015  . Hyperparathyroidism ([place]) 02/13/2015  . Type 2 diabetes mellitus ([place]) 02/13/2015  . Arteriosclerosis of coronary artery 12/09/2014  . CAFL (chronic airflow limitation) ([place]) 12/09/2014  . Enterogastritis 12/09/2014  . Acid reflux 12/09/2014  . HPTH (hyperparathyroidism) ([place]) 12/09/2014  . BP (high blood pressure) 12/09/2014  . Eczema intertrigo 12/09/2014  . Obstructive apnea 12/09/2014  . COPD, mild ([place]) 09/21/2014  . Atherosclerosis of coronary artery 09/21/2014  . Clinical depression 09/21/2014  . Diabetes mellitus, type 2 ([place]) 09/21/2014  . Essential (primary) hypertension 09/21/2014  . Cardiac murmur 09/21/2014  . HLD (hyperlipidemia) 09/21/2014  . Adult hypothyroidism 09/21/2014  . Adiposity 09/21/2014  . Adenocarcinoma, renal cell ([place]) 09/21/2014  . Malignant neoplasm of left kidney ([place]) 02/09/2013    Past Medical History  Diagnosis Date  . Hypertension   . Hyperlipidemia   . Anxiety   . Asthma   . COPD (chronic obstructive pulmonary disease) ([place])   . Coronary artery disease   . Depression   . Heart murmur   . Hypothyroidism   . Sleep apnea     CPAP  . AAA (abdominal aortic aneurysm) ([place])   . GERD (gastroesophageal reflux disease)   . Headache   . Chronic kidney disease     Renal Cell Adenocarcinoma  . Chronic kidney disease     Left Nephrectomy  . Hyperparathyroidism ([place])   . Neuropathy ([place])   . Cancer [facility])      Renal Cell- 2013; Lung 2017  . Eczema   . HPTH (hyperparathyroidism) ([place])   . Thoracic aortic aneurysm ([place]) 2017  . Shortness of breath dyspnea   . Diabetes mellitus without complication ([place])     Type II  . Encounter for antineoplastic chemotherapy 08/19/2015  . Hypertension 08/19/2015  . Malignant neoplasm of left kidney ([place]) 02/09/2013    Social History   Social History  . Marital Status: Married    Spouse Name: N/A  . Number of Children: N/A  . Years of Education: N/A   Occupational History  . Not on file.   Social History Main Topics  . Smoking status: Former Smoker -- 1.50 packs/day for 35 years    Types: Cigarettes    Quit date: 12/24/2001  . Smokeless tobacco: Never Used  . Alcohol Use: No  . Drug Use: No  . Sexual Activity: Not on file   Other Topics  Concern  . Not on file   Social History Narrative    Allergies  Allergen Reactions  . Amoxicillin Rash and Other (See Comments)    Other reaction(s): ANAPHYLAXIS  . Codeine Rash and Other (See Comments)    Other reaction(s): ANAPHYLAXIS  . Penicillins Rash and Other (See Comments)    Other reaction(s): ANAPHYLAXIS Has patient had a PCN reaction causing immediate rash, facial/tongue/throat swelling, SOB or lightheadedness with hypotension:  Has patient had a PCN reaction causing severe rash involving mucus membranes or skin necrosis:  Has patient had a PCN reaction that required hospitalization Has patient had a PCN reaction occurring within the last 10 years:  If all of the above answers are "NO", then may pro  . Ace Inhibitors Cough    Review of Systems  Constitutional: Positive for malaise/fatigue.  HENT: Negative.        Hoarseness  Eyes: Negative.   Respiratory: Negative.   Cardiovascular: Negative.   Gastrointestinal: Positive for nausea.       Poor appetite  Genitourinary: Negative.   Musculoskeletal: Negative.   Skin: Negative.   Neurological: Negative.   Endo/Heme/Allergies: Negative.     Psychiatric/Behavioral: Negative.     Immunization History  Administered Date(s) Administered  . Influenza, High Dose Seasonal PF 03/13/2015  . Pneumococcal Conjugate-13 03/28/2014  . Pneumococcal Polysaccharide-23 02/12/1998, 11/16/2011  . Td 04/11/2007   Objective:  There were no vitals taken for this visit.  Physical Exam  Constitutional: He is well-developed, well-nourished, and in no distress.  HENT:  Head: Normocephalic and atraumatic.  Right Ear: External ear normal.  Left Ear: External ear normal.  Nose: Nose normal.  Eyes: Conjunctivae and EOM are normal. Pupils are equal, round, and reactive to light.  Neck: Normal range of motion. Neck supple. No tracheal deviation present. No thyromegaly present.  Cardiovascular: Normal rate, regular rhythm, normal heart sounds and intact distal pulses.   Pulmonary/Chest: Effort normal and breath sounds normal.  Abdominal: Soft.  Musculoskeletal: Normal range of motion.  Lymphadenopathy:    He has no cervical adenopathy.  Neurological: He is alert. He has normal reflexes. Gait normal. GCS score is 15.  Skin: Skin is warm and dry.  Large skin tag  Psychiatric: Mood, memory, affect and judgment normal.    Lab Results  Component Value Date   WBC 5.7 10/08/2015   HGB 12.1* 10/08/2015   HCT 37.4* 10/08/2015   PLT 121* 10/08/2015   GLUCOSE 130 10/08/2015   CHOL 135 11/21/2013   TRIG 165* 11/21/2013   HDL 38 11/21/2013   LDLCALC 64 11/21/2013   TSH 1.26 11/21/2013   PSA 2.0 11/21/2013   INR 1.10 07/22/2015   HGBA1C 7.5 12/09/2014    CMP     Component Value Date/Time   NA 139 10/08/2015 1333   NA 137 09/22/2015 2059   NA 138 11/21/2013   NA 140 01/19/2012 0834   K 4.3 10/08/2015 1333   K 4.5 09/22/2015 2059   K 4.0 01/19/2012 0834   CL 104 09/22/2015 2059   CL 103 01/19/2012 0834   CO2 25 10/08/2015 1333   CO2 24 09/22/2015 2059   CO2 28 01/19/2012 0834   GLUCOSE 130 10/08/2015 1333   GLUCOSE 128* 09/22/2015  2059   GLUCOSE 166* 01/19/2012 0834   BUN 15.6 10/08/2015 1333   BUN 16 09/22/2015 2059   BUN 17 11/21/2013   BUN 12 01/19/2012 0834   CREATININE 1.1 10/08/2015 1333   CREATININE 1.05 09/22/2015 2059  CREATININE 1.1 11/21/2013   CREATININE 0.84 01/19/2012 0834   CALCIUM 8.9 10/08/2015 1333   CALCIUM 8.5* 09/22/2015 2059   CALCIUM 9.1 01/19/2012 0834   PROT 6.8 10/08/2015 1333   PROT 7.2 07/09/2015 1435   ALBUMIN 3.5 10/08/2015 1333   ALBUMIN 3.4* 07/09/2015 1435   AST 116* 10/08/2015 1333   AST 14* 07/09/2015 1435   ALT 133* 10/08/2015 1333   ALT 10* 07/09/2015 1435   ALKPHOS 69 10/08/2015 1333   ALKPHOS 74 07/09/2015 1435   BILITOT 1.28* 10/08/2015 1333   BILITOT 0.6 07/09/2015 1435   GFRNONAA >60 09/22/2015 2059   GFRNONAA >60 01/19/2012 0834   GFRAA >60 09/22/2015 2059   GFRAA >60 01/19/2012 0834    Assessment and Plan :  1. Type 2 diabetes mellitus without complication, without long-term current use of insulin (HCC) Pt has lost  15 pounds since last OV due not being able to eat due to Chemo. RTC 3 months or prn. - POCT HgB A1C- 7.5 today.  Most of visit is spent in counseling. 2. 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 It is possible the chest metastasis is causing the hoarseness. He has follow-up CT neck smut with Dr. [name]  3. Brain metastasis ([place])   4. Hoarseness  Common etiology of hoarseness as reflux. Treat for this presently. - omeprazole (PRILOSEC) 20 MG capsule; Take 1 capsule (20 mg total) by mouth every morning.  Dispense: 30 capsule; Refill: 12  5. Encounter for antineoplastic chemotherapy   6. Adiposity 7. Irritated skin tag Large irritated skin tag is removed from the right buttocks. Area is prepped with Betadine, anesthesia with lidocaine with epi, base of skin tag is exercise with curved blade. 8. CAD All risk factors are treated. Continue same.  Patient was seen and examined by Dr. Miguel Aschoff, and  noted scribed by Webb Laws, [place] MD [facility] 10/15/2015 1:54 PM

## 2015-10-20 ENCOUNTER — Telehealth: Payer: Self-pay | Admitting: Medical Oncology

## 2015-10-20 NOTE — Telephone Encounter (Signed)
Asking if he needs to f/u with Dr Isidore Moos about his headaches.He thinks that is what [name] told him to do at his last visit.  I transferred his call to XRT.

## 2015-10-23 ENCOUNTER — Other Ambulatory Visit: Payer: Self-pay | Admitting: Radiation Therapy

## 2015-10-23 DIAGNOSIS — C7931 Secondary malignant neoplasm of brain: Secondary | ICD-10-CM

## 2015-10-23 DIAGNOSIS — C7949 Secondary malignant neoplasm of other parts of nervous system: Principal | ICD-10-CM

## 2015-10-31 ENCOUNTER — Telehealth: Payer: Self-pay

## 2015-11-03 ENCOUNTER — Ambulatory Visit
Admission: RE | Admit: 2015-11-03 | Discharge: 2015-11-03 | Disposition: A | Discharge status: Home / Self Care | Payer: Medicare Other | Source: Ambulatory Visit | Attending: Radiation Oncology | Admitting: Radiation Oncology

## 2015-11-03 ENCOUNTER — Encounter: Payer: Self-pay | Admitting: Radiation Oncology

## 2015-11-03 DIAGNOSIS — C7931 Secondary malignant neoplasm of brain: Secondary | ICD-10-CM

## 2015-11-03 DIAGNOSIS — C7949 Secondary malignant neoplasm of other parts of nervous system: Principal | ICD-10-CM

## 2015-11-03 MED ORDER — GADOBENATE DIMEGLUMINE 529 MG/ML IV SOLN
20.0000 mL | Freq: Once | INTRAVENOUS | Status: AC | PRN
  Administered 2015-11-03: 20 mL via INTRAVENOUS

## 2015-11-04 NOTE — Progress Notes (Signed)
Radiation Oncology         [phone]) [phone] ________________________________  Name: Patrick Breed Sr. MRN: [id number]  Date: 11/05/2015  DOB: 09/27/1945  Follow-Up Visit Note  Outpatient   CC: [name], MD  Curt Bears, MD  Diagnosis:    DIAGNOSIS:    ICD-9-CM ICD-10-CM   1. Brain metastasis (HCC) 198.3 C79.31         Interval Since Last Radiation:  11  weeks ; Right temporal brain 37m / 20 Gy in 1 fraction (08/20/2015)  Narrative:  The patient returns today for routine follow-up.      Most recent MRI of the brain on 11/03/2015 demonstrates enhancing lesion in the temporal horn or subependymal region of the temporal horn of the right lateral ventricle has gotten smaller, now measuring 4 x 4 x 5 mm. This is consistent with a treated metastasis. There was also a newly seen 1 x 2 mm focus of enhancement in the nearby right temporal lobe consistent with a small metastasis. [facility]  Punctate lesion that is now present is bordering the prior high dose isodose cloud:     Symptomatically, denies  seizures, headaches, new neurologic deficits, visual changes.    Patient complains of nausea  In the PM, and diarrhea.  He is taking Votrient with Dr MEarlie Serverand takes Imodium for the bowel issues.  Status of systemic system per CT angio scan on 5-30 demonstrates persistent disease in the lungs and mediastinum, but patient reports that he's been told there has been a partial response to systemic therapy so far.  Medical oncology's plans for the patient are to continue Votrient.     Patient has lost weight, and has decreased appetite.  ALLERGIES:  is allergic to amoxicillin; codeine; penicillins; and ace inhibitors.  Meds: Current Outpatient Prescriptions  Medication Sig Dispense Refill  . ADVAIR DISKUS 100-50 MCG/DOSE AEPB INHALE 1 PUFF TWICE A DAY 60 each 12  . albuterol (PROVENTIL HFA;VENTOLIN HFA) 108 (90 BASE) MCG/ACT  inhaler Inhale 1 puff into the lungs every 6 (six) hours as needed for wheezing.     .Marland KitchenALPRAZolam (XANAX) 0.5 MG tablet TAKE ONE TABLET BY MOUTH TWICE DAILY AS NEEDED 60 tablet 5  . aspirin 81 MG tablet Take 81 mg by mouth at bedtime. Reported on 07/01/2015    . carvedilol (COREG) 25 MG tablet Take 1 tablet (25 mg total) by mouth 2 (two) times daily with a meal. 60 tablet 3  . Cholecalciferol 1000 UNITS tablet Take 1,000 Units by mouth daily.     .Marland KitchenFLUoxetine (PROZAC) 40 MG capsule TAKE ONE CAPSULE BY MOUTH EVERY DAY (Patient taking differently: TAKE 40 MG BY MOUTH EVERY DAY) 30 capsule 12  . loperamide (IMODIUM) 2 MG capsule Take by mouth as needed for diarrhea or loose stools.    .Marland Kitchenlosartan (COZAAR) 100 MG tablet TAKE ONE TABLET BY MOUTH EVERY DAY (Patient taking differently: TAKE 100 MG BY MOUTH EVERY DAY) 30 tablet 12  . meclizine (ANTIVERT) 25 MG tablet Take 25 mg by mouth daily as needed for dizziness.     . metFORMIN (GLUCOPHAGE) 1000 MG tablet TAKE ONE TABLET BY MOUTH TWICE DAILY 60 tablet 12  . mometasone (ELOCON) 0.1 % cream Apply 1 application topically daily. Use daily as directed for up 2 weeks out of the month 45 g 2  . naproxen (NAPROSYN) 500 MG tablet Take 1 tablet (500 mg total) by mouth  2 (two) times daily with a meal. 30 tablet 0  . omeprazole (PRILOSEC) 20 MG capsule Take 1 capsule (20 mg total) by mouth every morning. 30 capsule 12  . pazopanib (VOTRIENT) 200 MG tablet Take 4 tablets (800 mg total) by mouth daily. Take on an empty stomach. (Patient taking differently: Take 800 mg by mouth daily. Take on an empty stomach.) 120 tablet 2  . pioglitazone (ACTOS) 45 MG tablet TAKE ONE TABLET BY MOUTH EACH DAY. (Patient taking differently: TAKE 45 MG BY MOUTH EACH DAY.) 30 tablet 12  . prochlorperazine (COMPAZINE) 10 MG tablet TAKE ONE TABLET BY MOUTH EVERY SIX HOURSAS NEEDED FOR NAUSEA OR VOMITING 30 tablet 0  . simvastatin (ZOCOR) 40 MG tablet TAKE ONE TABLET BY MOUTH EVERY DAY 30  tablet 12   No current facility-administered medications for this encounter.    Physical Findings: The patient is in no acute distress. Patient is alert and oriented.  height is '5\' 11"'$  (1.803 m) and weight is 246 lb 3.2 oz (111.676 kg). His temperature is 97.6 F (36.4 C). His blood pressure is 122/92 and his pulse is 68. Marland Kitchen   General: Alert and oriented, in no acute distress HEENT: Head is normocephalic. Extraocular movements are intact. Oropharynx is clear. Neck: Neck is supple, no palpable cervical or supraclavicular lymphadenopathy. Heart: Regular in rate and rhythm with no murmurs, rubs, or gallops. Chest: Clear to auscultation bilaterally, with no rhonchi, wheezes, or rales. Lymphatics: see Neck Exam Skin: No concerning lesions. Musculoskeletal: symmetric strength and muscle tone throughout. Neurologic: Cranial nerves II through XII are grossly intact. No obvious focalities. Speech is fluent. Coordination is intact. Psychiatric: Judgment and insight are intact. Affect is appropriate.    Lab Findings: Lab Results  Component Value Date   WBC 5.7 10/08/2015   HGB 12.1* 10/08/2015   HCT 37.4* 10/08/2015   MCV 85.6 10/08/2015   PLT 121* 10/08/2015    '@LASTCHEMISTRY'$ @  Radiographic Findings: Mr Jeri Cos BZ Contrast  11/03/2015  CLINICAL DATA:  S RS staging protocol. Worsening headaches. Followup previous lesion EXAM: MRI HEAD WITHOUT AND WITH CONTRAST TECHNIQUE: Multiplanar, multiecho pulse sequences of the brain and surrounding structures were obtained without and with intravenous contrast. CONTRAST:  25m MULTIHANCE GADOBENATE DIMEGLUMINE 529 MG/ML IV SOLN COMPARISON:  08/15/2015.  07/02/2015 FINDINGS: Diffusion imaging does not show any acute or subacute infarction or other cause of restricted diffusion. The brainstem is normal. 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 There are old small vessel infarctions affecting the thalami, basal ganglia an the cerebral hemispheric  deep and subcortical white matter. No cortical or large vessel territory infarction. Enhancing nodule in the region of the temporal horn of the right lateral ventricle has gotten smaller, now measuring 4 x 4 x 5 mm, suggesting that it does represent a metastasis. Near this, there is a newly seen 1 x 2 mm punctate focus of enhancement in the right temporal lobe. This is not visible on either of the 2 previous exams and is consistent with a new metastatic focus. No other new brain lesion. No hydrocephalus or extra-axial collection. No pituitary mass. No skull or skullbase lesion. IMPRESSION: Enhancing lesion in the temporal horn or subependymal region of the temporal horn of the right lateral ventricle has gotten smaller, now measuring 4 x 4 x 5 mm. This is consistent with a treated metastasis. Newly seen 1 x 2 mm focus of enhancement in the nearby right temporal lobe consistent with a small metastasis. Electronically Signed  By: Nelson Chimes M.D.   On: 11/03/2015 13:05    Impression/Plan: I spoke with Dr Sherwood Gambler about the patient's MRI and reviewed his prior [facility] plan as above.  The punctate area that is new could be vascular, but a new metastasis is also possible.  This would be a high risk area to treat with SRS given the prior dose to this region. It is not amenable to surgery.  We will observe.  Dr Sherwood Gambler will see him back in early Oct with MRI of brain prior to the visit.  I will refer pt to nutrition for weight loss, appetite issues.  Advised him to ask Dr [name] if Lomotil is advisable for his persistent diarrhea.    _____________________________________   Eppie Gibson, MD   This document serves as a record of services personally performed by Eppie Gibson, MD. It was created on her behalf by Arlyce Harman, a trained medical scribe. The creation of this record is based on the scribe's personal observations and the provider's statements to them. This document has been checked and  approved by the attending provider.

## 2015-11-05 ENCOUNTER — Encounter: Payer: Self-pay | Admitting: Radiation Oncology

## 2015-11-05 ENCOUNTER — Ambulatory Visit
Admission: RE | Admit: 2015-11-05 | Discharge: 2015-11-05 | Disposition: A | Discharge status: Home / Self Care | Payer: Medicare Other | Source: Ambulatory Visit | Attending: Radiation Oncology | Admitting: Radiation Oncology

## 2015-11-05 VITALS — BP 122/92 | HR 68 | Temp 97.6°F | Ht 71.0 in | Wt 246.2 lb

## 2015-11-05 DIAGNOSIS — Z880 Allergy status to penicillin: Secondary | ICD-10-CM | POA: Insufficient documentation

## 2015-11-05 DIAGNOSIS — R197 Diarrhea, unspecified: Secondary | ICD-10-CM | POA: Insufficient documentation

## 2015-11-05 DIAGNOSIS — Z7984 Long term (current) use of oral hypoglycemic drugs: Secondary | ICD-10-CM | POA: Diagnosis not present

## 2015-11-05 DIAGNOSIS — C7931 Secondary malignant neoplasm of brain: Secondary | ICD-10-CM

## 2015-11-05 DIAGNOSIS — Z923 Personal history of irradiation: Secondary | ICD-10-CM | POA: Diagnosis not present

## 2015-11-05 DIAGNOSIS — Z79899 Other long term (current) drug therapy: Secondary | ICD-10-CM | POA: Insufficient documentation

## 2015-11-05 DIAGNOSIS — Z7982 Long term (current) use of aspirin: Secondary | ICD-10-CM | POA: Insufficient documentation

## 2015-11-05 DIAGNOSIS — R634 Abnormal weight loss: Secondary | ICD-10-CM | POA: Diagnosis not present

## 2015-11-05 DIAGNOSIS — Z888 Allergy status to other drugs, medicaments and biological substances status: Secondary | ICD-10-CM | POA: Diagnosis not present

## 2015-11-05 DIAGNOSIS — R51 Headache: Secondary | ICD-10-CM | POA: Diagnosis not present

## 2015-11-05 HISTORY — DX: Personal history of irradiation: Z92.3

## 2015-11-05 NOTE — Progress Notes (Signed)
Mr. Krejci presents for follow up of [place] radiation completed 08/20/15 for one fraction. He denies pain at this time. He does report fatigue. He is experiencing chronic diarrhea, which he relates to the Chemotherapy medicine he is taking called Votrient. He will take 2 imodium daily to help. He states it will help somewhat, but he continues with the diarrhea. He also reports nausea in the afternoon, and takes a compazine daily for this concern. He is concerned about his decreased appetite. He has been drinking Glucerna to supplement his diet. He reports "food just does not taste the same anymore". He feels like he is drinking enough liquids which includes water and powerade. He denies headaches at this time. He did have severe headaches after his SRS treatment, and saw his primary doctor who gave him a shot (though he is not sure what is was), with improvement of his headaches.   BP 122/92 mmHg  Pulse 68  Temp(Src) 97.6 F (36.4 C)  Ht '5\' 11"'$  (1.803 m)  Wt 246 lb 3.2 oz (111.676 kg)  BMI 34.35 kg/m2   Wt Readings from Last 3 Encounters:  11/05/15 246 lb 3.2 oz (111.676 kg)  10/15/15 252 lb (114.306 kg)  10/08/15 252 lb 6.4 oz (114.488 kg)

## 2015-11-07 ENCOUNTER — Other Ambulatory Visit: Payer: Self-pay | Admitting: Radiation Oncology

## 2015-11-07 ENCOUNTER — Telehealth: Payer: Self-pay | Admitting: Pharmacist

## 2015-11-07 ENCOUNTER — Telehealth: Payer: Self-pay | Admitting: *Deleted

## 2015-11-07 DIAGNOSIS — C7931 Secondary malignant neoplasm of brain: Secondary | ICD-10-CM

## 2015-11-07 NOTE — Telephone Encounter (Signed)
Oral Chemotherapy Follow-Up Form  Original Start date of oral chemotherapy: 08/08/15  Called patient today to follow up regarding patient's oral chemotherapy medication: Votrient Pt is on 600 mg daily   Pt reports the following side effects: "Constant Diarrhea" - Pt has at least 3-4 loose stools daily.  Never solid.  The worst is up to 5 loose/watery stools daily.  He sometimes is awakened in the night and has to have BM.  He is using Imodium but it isn't working.  When I asked him how he had been taking Imodium, he stated he takes 1 tablet w/ 1st loose stool then another half of tablet and no more than 2 tablets total daily.  I instructed him to increase his dose to 2 tablets w/ 1st BM then 1 tablet w/ each loose stool then no more than 8 tablets/day.  Pt will try this and if it isn't improved, we may need to consider Lomotil. He is drinking a lot of fluids and I encouraged him to continue this.  He doesn't want to get dehydrated again. Poor appetite - he still tries to eat.  He has appt w/ nutrition team soon.  Fatigue is better now that dose of Votrient is lower.  Pt has lab/MD appt on 11/11/15.    We will call pt in another 4-6 weeks and if no issues at that time, we will stop making calls.  Pt aware.  Thank you, Kennith Center, Pharm.D., CPP 11/07/2015'@4'$ :48 PM Oral Chemotherapy Clinic

## 2015-11-07 NOTE — Telephone Encounter (Signed)
CALLED PATIENT TO INFORM OF APPT. WITH NUTRITIONIST ON 11-19-15 @ 10:30 AM , SPOKE WITH PATIENT AND HE IS AWARE OF THIS  APPT.

## 2015-11-10 ENCOUNTER — Other Ambulatory Visit: Payer: Self-pay | Admitting: Internal Medicine

## 2015-11-11 ENCOUNTER — Other Ambulatory Visit (HOSPITAL_BASED_OUTPATIENT_CLINIC_OR_DEPARTMENT_OTHER): Payer: Medicare Other

## 2015-11-11 ENCOUNTER — Encounter: Payer: Self-pay | Admitting: Internal Medicine

## 2015-11-11 ENCOUNTER — Ambulatory Visit (HOSPITAL_BASED_OUTPATIENT_CLINIC_OR_DEPARTMENT_OTHER): Payer: Medicare Other | Admitting: Internal Medicine

## 2015-11-11 ENCOUNTER — Encounter: Payer: Self-pay | Admitting: Family Medicine

## 2015-11-11 ENCOUNTER — Telehealth: Payer: Self-pay | Admitting: Internal Medicine

## 2015-11-11 VITALS — BP 125/76 | HR 76 | Temp 97.0°F | Resp 18 | Ht 71.0 in | Wt 243.3 lb

## 2015-11-11 DIAGNOSIS — C642 Malignant neoplasm of left kidney, except renal pelvis: Secondary | ICD-10-CM

## 2015-11-11 DIAGNOSIS — C7931 Secondary malignant neoplasm of brain: Secondary | ICD-10-CM

## 2015-11-11 DIAGNOSIS — R5383 Other fatigue: Secondary | ICD-10-CM | POA: Diagnosis not present

## 2015-11-11 DIAGNOSIS — Z5111 Encounter for antineoplastic chemotherapy: Secondary | ICD-10-CM

## 2015-11-11 LAB — CBC WITH DIFFERENTIAL/PLATELET
BASO%: 0.7 % (ref 0.0–2.0)
BASOS ABS: 0 10*3/uL (ref 0.0–0.1)
EOS%: 8.6 % — ABNORMAL HIGH (ref 0.0–7.0)
Eosinophils Absolute: 0.5 10*3/uL (ref 0.0–0.5)
HCT: 35 % — ABNORMAL LOW (ref 38.4–49.9)
HGB: 11.3 g/dL — ABNORMAL LOW (ref 13.0–17.1)
LYMPH%: 25.7 % (ref 14.0–49.0)
MCH: 29.1 pg (ref 27.2–33.4)
MCHC: 32.3 g/dL (ref 32.0–36.0)
MCV: 90.1 fL (ref 79.3–98.0)
MONO#: 0.4 10*3/uL (ref 0.1–0.9)
MONO%: 6.9 % (ref 0.0–14.0)
NEUT#: 3.3 10*3/uL (ref 1.5–6.5)
NEUT%: 58.1 % (ref 39.0–75.0)
PLATELETS: 105 10*3/uL — AB (ref 140–400)
RBC: 3.88 10*6/uL — AB (ref 4.20–5.82)
RDW: 25.8 % — ABNORMAL HIGH (ref 11.0–14.6)
WBC: 5.6 10*3/uL (ref 4.0–10.3)
lymph#: 1.4 10*3/uL (ref 0.9–3.3)

## 2015-11-11 LAB — COMPREHENSIVE METABOLIC PANEL
ALK PHOS: 63 U/L (ref 40–150)
ALT: 41 U/L (ref 0–55)
ANION GAP: 9 meq/L (ref 3–11)
AST: 45 U/L — ABNORMAL HIGH (ref 5–34)
Albumin: 3.3 g/dL — ABNORMAL LOW (ref 3.5–5.0)
BILIRUBIN TOTAL: 0.95 mg/dL (ref 0.20–1.20)
BUN: 22.3 mg/dL (ref 7.0–26.0)
CO2: 22 mEq/L (ref 22–29)
Calcium: 7.1 mg/dL — ABNORMAL LOW (ref 8.4–10.4)
Chloride: 108 mEq/L (ref 98–109)
Creatinine: 1.5 mg/dL — ABNORMAL HIGH (ref 0.7–1.3)
EGFR: 48 mL/min/{1.73_m2} — ABNORMAL LOW (ref >90)
GLUCOSE: 103 mg/dL (ref 70–140)
Potassium: 5.5 mEq/L — ABNORMAL HIGH (ref 3.5–5.1)
Sodium: 139 mEq/L (ref 136–145)
Total Protein: 6.5 g/dL (ref 6.4–8.3)

## 2015-11-11 LAB — LACTATE DEHYDROGENASE: LDH: 278 U/L — ABNORMAL HIGH (ref 125–245)

## 2015-11-11 MED FILL — *VOTRIENT 200 MG TABLET: 200 | 30 days supply | Qty: 120 | Fill #0

## 2015-11-11 NOTE — Progress Notes (Signed)
[facility] [phone]   Fax:([phone]) [place], MD 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 This was initially diagnosed as a stage III status post left nephrectomy as well as left adrenalectomy on 03/06/2012. 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  PRIOR THERAPY: Stereotactic radiotherapy to the solitary brain lesion scheduled for 08/20/2015  CURRENT THERAPY: Votrient 800 mg by mouth daily started 08/08/2015. His dose will be reduced to Votrient 600 mg by mouth daily starting 10/09/2015.  INTERVAL HISTORY: [name] Sr. 70 y.o. male returns to the clinic today for follow-up visit. The patient is currently on Votrient 600 mg by mouth daily since 10/09/2015 and tolerating it much better. He has less diarrhea improved with Imodium. No significant skin rash. He denied having any significant nausea, vomiting or constipation. He has no chest pain, shortness of breath, cough or hemoptysis. He has no fever or chills. The patient had a recent MRI of the brain that showed improvement on the previous lesion but there was a 1-2 millimeter lesion followed closely by Dr. Isidore Moos. He has repeat bloodwork performed earlier today and he is here for evaluation and discussion of his lab results.  MEDICAL HISTORY: Past Medical History  Diagnosis Date  . Hypertension   . Hyperlipidemia   . Anxiety   . Asthma   . COPD (chronic obstructive pulmonary disease) ([place])   . Coronary artery disease   . Depression   . Heart murmur   . Hypothyroidism   . Sleep apnea     CPAP  . AAA (abdominal aortic aneurysm) ([place])   . GERD (gastroesophageal reflux disease)   . Headache     . Chronic kidney disease     Renal Cell Adenocarcinoma  . Chronic kidney disease     Left Nephrectomy  . Hyperparathyroidism ([place])   . Neuropathy ([place])   . Cancer [facility])     Renal Cell- 2013; Lung 2017  . Eczema   . HPTH (hyperparathyroidism) ([place])   . Thoracic aortic aneurysm ([place]) 2017  . Shortness of breath dyspnea   . Diabetes mellitus without complication ([place])     Type II  . Encounter for antineoplastic chemotherapy 08/19/2015  . Hypertension 08/19/2015  . Malignant neoplasm of left kidney ([place]) 02/09/2013  . Hx of radiation therapy 08/20/15    Right Temporal brain    ALLERGIES:  is allergic to amoxicillin; codeine; penicillins; and ace inhibitors.  MEDICATIONS:  Current Outpatient Prescriptions  Medication Sig Dispense Refill  . ADVAIR DISKUS 100-50 MCG/DOSE AEPB INHALE 1 PUFF TWICE A DAY 60 each 12  . albuterol (PROVENTIL HFA;VENTOLIN HFA) 108 (90 BASE) MCG/ACT inhaler Inhale 1 puff into the lungs every 6 (six) hours as needed for wheezing.     Marland Kitchen ALPRAZolam (XANAX) 0.5 MG tablet TAKE ONE TABLET BY MOUTH TWICE DAILY AS NEEDED 60 tablet 5  . aspirin 81 MG tablet Take 81 mg by mouth at bedtime. Reported on 07/01/2015    . carvedilol (COREG) 25 MG tablet Take 1 tablet (25 mg total) by mouth 2 (two) times daily with a meal. 60 tablet 3  . Cholecalciferol 1000 UNITS tablet Take 1,000 Units by mouth daily.     Marland Kitchen  FLUoxetine (PROZAC) 40 MG capsule TAKE ONE CAPSULE BY MOUTH EVERY DAY (Patient taking differently: TAKE 40 MG BY MOUTH EVERY DAY) 30 capsule 12  . loperamide (IMODIUM) 2 MG capsule Take by mouth as needed for diarrhea or loose stools.    Marland Kitchen losartan (COZAAR) 100 MG tablet TAKE ONE TABLET BY MOUTH EVERY DAY (Patient taking differently: TAKE 100 MG BY MOUTH EVERY DAY) 30 tablet 12  . meclizine (ANTIVERT) 25 MG tablet Take 25 mg by mouth daily as needed for dizziness.     . metFORMIN (GLUCOPHAGE) 1000 MG tablet TAKE ONE TABLET BY MOUTH TWICE DAILY 60 tablet 12  . mometasone  (ELOCON) 0.1 % cream Apply 1 application topically daily. Use daily as directed for up 2 weeks out of the month 45 g 2  . naproxen (NAPROSYN) 500 MG tablet Take 1 tablet (500 mg total) by mouth 2 (two) times daily with a meal. 30 tablet 0  . omeprazole (PRILOSEC) 20 MG capsule Take 1 capsule (20 mg total) by mouth every morning. 30 capsule 12  . pioglitazone (ACTOS) 45 MG tablet TAKE ONE TABLET BY MOUTH EACH DAY. (Patient taking differently: TAKE 45 MG BY MOUTH EACH DAY.) 30 tablet 12  . prochlorperazine (COMPAZINE) 10 MG tablet TAKE ONE TABLET BY MOUTH EVERY SIX HOURSAS NEEDED FOR NAUSEA OR VOMITING 30 tablet 0  . simvastatin (ZOCOR) 40 MG tablet TAKE ONE TABLET BY MOUTH EVERY DAY 30 tablet 12  . VOTRIENT 200 MG tablet TAKE 4 TABLETS BY MOUTH DAILY. TAKE ON AN EMPTY STOMACH. 120 tablet 2   No current facility-administered medications for this visit.    SURGICAL HISTORY:  Past Surgical History  Procedure Laterality Date  . Parathyroidectomy  12/17/09  . Coronary artery bypass graft      x 5  . Back surgery      compressed vertebrae-put in a plate to seprate the spaces  . Fracture surgery      left arm repair  . Knee surgery      left knee repair  . Cardiac catheterization    . Cervical fusion    . Endobronchial ultrasound N/A 06/30/2015    Procedure: ENDOHIAL ULTRASOUND;  Surgeon: Flora Lipps, MD;  Location: [facility] ORS;  Service: Cardiopulmonary;  Laterality: N/A;  . Video bronchoscopy with endobronchial ultrasound N/A 07/09/2015    Procedure: VIDEO BRONCHOSCOPY WITH ENDOBRONCHIAL ULTRASOUND;  Surgeon: Ivin Poot, MD;  Location: [facility] OR;  Service: Thoracic;  Laterality: N/A;    REVIEW OF SYSTEMS:  A comprehensive review of systems was negative except for: Constitutional: positive for fatigue   PHYSICAL EXAMINATION: General appearance: alert, cooperative, fatigued and no distress Head: Normocephalic, without obvious abnormality, atraumatic Neck: no adenopathy, no JVD, supple,  symmetrical, trachea midline and thyroid not enlarged, symmetric, no tenderness/mass/nodules Lymph nodes: Cervical, supraclavicular, and axillary nodes normal. Resp: clear to auscultation bilaterally Back: symmetric, no curvature. ROM normal. No CVA tenderness. Cardio: regular rate and rhythm, S1, S2 normal, no murmur, click, rub or gallop GI: soft, non-tender; bowel sounds normal; no masses,  no organomegaly Extremities: extremities normal, atraumatic, no cyanosis or edema Neurologic: Alert and oriented X 3, normal strength and tone. Normal symmetric reflexes. Normal coordination and gait  ECOG PERFORMANCE STATUS: 1 - Symptomatic but completely ambulatory  Blood pressure 125/76, pulse 76, temperature 97 F (36.1 C), temperature source Oral, resp. rate 18, height '5\' 11"'$  (1.803 m), weight 243 lb 4.8 oz (110.36 kg), SpO2 99 %.  LABORATORY DATA: Lab Results  Component Value  Date   WBC 5.6 11/11/2015   HGB 11.3* 11/11/2015   HCT 35.0* 11/11/2015   MCV 90.1 11/11/2015   PLT 105* 11/11/2015      Chemistry      Component Value Date/Time   NA 139 10/08/2015 1333   NA 137 09/22/2015 2059   NA 138 11/21/2013   NA 140 01/19/2012 0834   K 4.3 10/08/2015 1333   K 4.5 09/22/2015 2059   K 4.0 01/19/2012 0834   CL 104 09/22/2015 2059   CL 103 01/19/2012 0834   CO2 25 10/08/2015 1333   CO2 24 09/22/2015 2059   CO2 28 01/19/2012 0834   BUN 15.6 10/08/2015 1333   BUN 16 09/22/2015 2059   BUN 17 11/21/2013   BUN 12 01/19/2012 0834   CREATININE 1.1 10/08/2015 1333   CREATININE 1.05 09/22/2015 2059   CREATININE 1.1 11/21/2013   CREATININE 0.84 01/19/2012 0834   GLU 159 11/21/2013      Component Value Date/Time   CALCIUM 8.9 10/08/2015 1333   CALCIUM 8.5* 09/22/2015 2059   CALCIUM 9.1 01/19/2012 0834   ALKPHOS 69 10/08/2015 1333   ALKPHOS 74 07/09/2015 1435   AST 116* 10/08/2015 1333   AST 14* 07/09/2015 1435   ALT 133* 10/08/2015 1333   ALT 10* 07/09/2015 1435   BILITOT 1.28*  10/08/2015 1333   BILITOT 0.6 07/09/2015 1435       RADIOGRAPHIC STUDIES: Mr Jeri Cos Wo Contrast  Nov 04, 2015  CLINICAL DATA:  S RS staging protocol. Worsening headaches. Followup previous lesion EXAM: MRI HEAD WITHOUT AND WITH CONTRAST TECHNIQUE: Multiplanar, multiecho pulse sequences of the brain and surrounding structures were obtained without and with intravenous contrast. CONTRAST:  25m MULTIHANCE GADOBENATE DIMEGLUMINE 529 MG/ML IV SOLN COMPARISON:  08/15/2015.  07/02/2015 FINDINGS: Diffusion imaging does not show any acute or subacute infarction or other cause of restricted diffusion. The brainstem is normal. 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 There are old small vessel infarctions affecting the thalami, basal ganglia an the cerebral hemispheric deep and subcortical white matter. No cortical or large vessel territory infarction. Enhancing nodule in the region of the temporal horn of the right lateral ventricle has gotten smaller, now measuring 4 x 4 x 5 mm, suggesting that it does represent a metastasis. Near this, there is a newly seen 1 x 2 mm punctate focus of enhancement in the right temporal lobe. This is not visible on either of the 2 previous exams and is consistent with a new metastatic focus. No other new brain lesion. No hydrocephalus or extra-axial collection. No pituitary mass. No skull or skullbase lesion. IMPRESSION: Enhancing lesion in the temporal horn or subependymal region of the temporal horn of the right lateral ventricle has gotten smaller, now measuring 4 x 4 x 5 mm. This is consistent with a treated metastasis. Newly seen 1 x 2 mm focus of enhancement in the nearby right temporal lobe consistent with a small metastasis. Electronically Signed   By: MNelson ChimesM.D.   On: 0July 11, 201713:05    ASSESSMENT AND PLAN: This is a very pleasant 70years old white male with metastatic renal cell carcinoma currently on treatment with Votrient 800 mg by mouth daily status  post 2 months of treatment and he has been tolerating his treatment well except for persistent fatigue. His does was a change it to Votrient 600 mg by mouth daily since 10/09/2015 and he is tolerating it much better. I recommended for the patient to continue on  Votrient with the same dose for now. I will see him back for follow-up visit in 3 weeks for reevaluation with repeat blood work. He would come back for follow-up visit in one month for evaluation and management of any adverse effect of his treatment. The patient was advised to call immediately if he has any concerning symptoms in the interval. The patient voices understanding of current disease status and treatment options and is in agreement with the current care plan.  All questions were answered. The patient knows to call the clinic with any problems, questions or concerns. We can certainly see the patient much sooner if necessary.  Disclaimer: This note was dictated with voice recognition software. Similar sounding words can inadvertently be transcribed and may not be corrected upon review.

## 2015-11-11 NOTE — Telephone Encounter (Signed)
Gave patient avs report and appointments for August.  °

## 2015-11-11 NOTE — Progress Notes (Signed)
Quick Note:  Call patient with the result and repeat B MET in 1-2 days ______

## 2015-11-12 ENCOUNTER — Telehealth: Payer: Self-pay | Admitting: Internal Medicine

## 2015-11-12 ENCOUNTER — Telehealth: Payer: Self-pay | Admitting: Medical Oncology

## 2015-11-12 DIAGNOSIS — E875 Hyperkalemia: Secondary | ICD-10-CM

## 2015-11-12 NOTE — Telephone Encounter (Signed)
left msg confirmed apt change to 8/10

## 2015-11-12 NOTE — Telephone Encounter (Signed)
I left a message for pt. onc tx request sent.

## 2015-11-12 NOTE — Telephone Encounter (Signed)
-----  Message from Curt Bears, MD sent at 11/11/2015  6:29 PM EDT ----- Call patient with the result and repeat B MET in 1-2 days

## 2015-11-13 ENCOUNTER — Encounter: Payer: Medicare Other | Admitting: Nurse Practitioner

## 2015-11-13 ENCOUNTER — Ambulatory Visit (HOSPITAL_BASED_OUTPATIENT_CLINIC_OR_DEPARTMENT_OTHER): Payer: Medicare Other

## 2015-11-13 ENCOUNTER — Telehealth: Payer: Self-pay | Admitting: Medical Oncology

## 2015-11-13 ENCOUNTER — Telehealth: Payer: Self-pay

## 2015-11-13 ENCOUNTER — Other Ambulatory Visit: Payer: Self-pay | Admitting: Nurse Practitioner

## 2015-11-13 DIAGNOSIS — E875 Hyperkalemia: Secondary | ICD-10-CM

## 2015-11-13 DIAGNOSIS — C649 Malignant neoplasm of unspecified kidney, except renal pelvis: Secondary | ICD-10-CM

## 2015-11-13 DIAGNOSIS — C642 Malignant neoplasm of left kidney, except renal pelvis: Secondary | ICD-10-CM

## 2015-11-13 LAB — BASIC METABOLIC PANEL
Anion Gap: 10 mEq/L (ref 3–11)
BUN: 24.2 mg/dL (ref 7.0–26.0)
CHLORIDE: 109 meq/L (ref 98–109)
CO2: 21 meq/L — AB (ref 22–29)
Calcium: 6.9 mg/dL — ABNORMAL LOW (ref 8.4–10.4)
Creatinine: 1.3 mg/dL (ref 0.7–1.3)
EGFR: 54 mL/min/{1.73_m2} — ABNORMAL LOW (ref >90)
Glucose: 110 mg/dl (ref 70–140)
POTASSIUM: 5 meq/L (ref 3.5–5.1)
Sodium: 140 mEq/L (ref 136–145)

## 2015-11-13 NOTE — Telephone Encounter (Signed)
Pt walked in to be rechecked for his K+ levels, as he was high 2 days ago.  Results came back WNL so I spoke to pt about the results and offered treatment if he felt like he needed it.  pt declined tx stating "he felt better already, knowing his levels were normal" told pt if anything changed or he felt he needed tx in the future to call us, pt verbalized understanding

## 2015-11-13 NOTE — Telephone Encounter (Signed)
Left message

## 2015-11-19 ENCOUNTER — Ambulatory Visit: Payer: Medicare Other | Admitting: Nutrition

## 2015-11-19 NOTE — Progress Notes (Signed)
70 year old male diagnosed with metastatic renal cell cancer status post left nephrectomy.  He is a patient of Dr. [name].  Past medical history includes hypertension, hyperlipidemia, anxiety, COPD, CAD, depression, AAA, GERD, diabetes type 2 and radiation therapy to the brain.  Medications include Votrient, Xanax, Prozac, Imodium, Glucophage, Prilosec, Actos, Compazine, and Zocor.  Labs were reviewed.  Height: 5 feet 11 inches. Weight: 243.3 pounds. Usual body weight: 308 pounds one year ago. BMI: 33.95.  Patient reports poor appetite and decreased oral intake. He complains of taste alterations. Reports 3-4 soft stools daily.  Even with Imodium. Reports he enjoys Glucerna and boost glucose control. Patient typically consumes 2 meals daily but has not really been snacking.  Nutrition diagnosis: Severe malnutrition related to inadequate oral intake and diarrhea as evidenced by 21% weight loss over one year and depletion of fat stores and muscle mass.  Intervention:  Educated patient on strategies for improving appetite.  Recommended 3 small meals with 2 snacks daily. Educated patient on ways to improve his taste alterations. Educated patient on lower fiber diet to improve diarrhea. Fact sheets were provided. Samples were given and coupons were provided.  Questions were answered and teach back method was used.  Monitoring, evaluation, goals:  Patient will tolerate adequate calories and protein to minimize further weight loss.  Next visit: Patient will contact me for follow-up if needed.  **Disclaimer: This note was dictated with voice recognition software. Similar sounding words can inadvertently be transcribed and this note may contain transcription errors which may not have been corrected upon publication of note.**

## 2015-12-01 ENCOUNTER — Telehealth: Payer: Self-pay | Admitting: Family Medicine

## 2015-12-01 NOTE — Telephone Encounter (Signed)
Pt called needing his disabled parking pass renewed.  I think he gets one every six months.  His call backis [phone]  He is dropping by this afternoon if we could get it ready.  Thanks Con Memos

## 2015-12-01 NOTE — Telephone Encounter (Signed)
ok 

## 2015-12-01 NOTE — Telephone Encounter (Signed)
Ok to get this ready for him?

## 2015-12-02 ENCOUNTER — Ambulatory Visit: Payer: Medicare Other | Admitting: Internal Medicine

## 2015-12-02 ENCOUNTER — Other Ambulatory Visit: Payer: Medicare Other

## 2015-12-02 NOTE — Telephone Encounter (Signed)
Filled out, waiting on signature.

## 2015-12-02 NOTE — Telephone Encounter (Signed)
Pt informed, ready to pick up.

## 2015-12-04 ENCOUNTER — Other Ambulatory Visit: Payer: Medicare Other

## 2015-12-04 ENCOUNTER — Ambulatory Visit: Payer: Medicare Other | Admitting: Internal Medicine

## 2015-12-08 ENCOUNTER — Emergency Department (HOSPITAL_COMMUNITY): Payer: Medicare Other

## 2015-12-08 ENCOUNTER — Encounter (HOSPITAL_COMMUNITY): Payer: Self-pay | Admitting: *Deleted

## 2015-12-08 ENCOUNTER — Emergency Department (HOSPITAL_COMMUNITY)
Admission: EM | Admit: 2015-12-08 | Discharge: 2015-12-08 | Disposition: A | Discharge status: Home / Self Care | Payer: Medicare Other | Attending: Emergency Medicine | Admitting: Emergency Medicine

## 2015-12-08 DIAGNOSIS — C7800 Secondary malignant neoplasm of unspecified lung: Secondary | ICD-10-CM | POA: Insufficient documentation

## 2015-12-08 DIAGNOSIS — R110 Nausea: Secondary | ICD-10-CM | POA: Diagnosis not present

## 2015-12-08 DIAGNOSIS — Z79899 Other long term (current) drug therapy: Secondary | ICD-10-CM | POA: Insufficient documentation

## 2015-12-08 DIAGNOSIS — R202 Paresthesia of skin: Secondary | ICD-10-CM | POA: Insufficient documentation

## 2015-12-08 DIAGNOSIS — I129 Hypertensive chronic kidney disease with stage 1 through stage 4 chronic kidney disease, or unspecified chronic kidney disease: Secondary | ICD-10-CM | POA: Diagnosis not present

## 2015-12-08 DIAGNOSIS — Z955 Presence of coronary angioplasty implant and graft: Secondary | ICD-10-CM | POA: Insufficient documentation

## 2015-12-08 DIAGNOSIS — Z85528 Personal history of other malignant neoplasm of kidney: Secondary | ICD-10-CM | POA: Insufficient documentation

## 2015-12-08 DIAGNOSIS — Z87891 Personal history of nicotine dependence: Secondary | ICD-10-CM | POA: Insufficient documentation

## 2015-12-08 DIAGNOSIS — N189 Chronic kidney disease, unspecified: Secondary | ICD-10-CM | POA: Diagnosis not present

## 2015-12-08 DIAGNOSIS — E039 Hypothyroidism, unspecified: Secondary | ICD-10-CM | POA: Diagnosis not present

## 2015-12-08 DIAGNOSIS — E1122 Type 2 diabetes mellitus with diabetic chronic kidney disease: Secondary | ICD-10-CM | POA: Diagnosis not present

## 2015-12-08 DIAGNOSIS — J449 Chronic obstructive pulmonary disease, unspecified: Secondary | ICD-10-CM | POA: Insufficient documentation

## 2015-12-08 DIAGNOSIS — Z7982 Long term (current) use of aspirin: Secondary | ICD-10-CM | POA: Diagnosis not present

## 2015-12-08 DIAGNOSIS — I2510 Atherosclerotic heart disease of native coronary artery without angina pectoris: Secondary | ICD-10-CM | POA: Insufficient documentation

## 2015-12-08 DIAGNOSIS — Z7984 Long term (current) use of oral hypoglycemic drugs: Secondary | ICD-10-CM | POA: Insufficient documentation

## 2015-12-08 DIAGNOSIS — J45909 Unspecified asthma, uncomplicated: Secondary | ICD-10-CM | POA: Insufficient documentation

## 2015-12-08 LAB — CBC
HEMATOCRIT: 32.7 % — AB (ref 39.0–52.0)
HEMOGLOBIN: 10.8 g/dL — AB (ref 13.0–17.0)
MCH: 30.8 pg (ref 26.0–34.0)
MCHC: 33 g/dL (ref 30.0–36.0)
MCV: 93.2 fL (ref 78.0–100.0)
Platelets: 115 10*3/uL — ABNORMAL LOW (ref 150–400)
RBC: 3.51 MIL/uL — AB (ref 4.22–5.81)
RDW: 20.4 % — ABNORMAL HIGH (ref 11.5–15.5)
WBC: 5.6 10*3/uL (ref 4.0–10.5)

## 2015-12-08 LAB — CBG MONITORING, ED: Glucose-Capillary: 106 mg/dL — ABNORMAL HIGH (ref 65–99)

## 2015-12-08 NOTE — Discharge Instructions (Signed)
Recheck as needed with any recurrence or worsening of your symptoms.

## 2015-12-08 NOTE — ED Notes (Signed)
Pt aware of the need for urine 

## 2015-12-08 NOTE — ED Triage Notes (Signed)
Pt from home, reports woke up this am with nausea, started to have L arm numbness around 1300 which resolved.  Pt reports mild nausea.  Pt reports he takes PO chemo daily which makes him nauseous.  Pt reports visual changes with the numbness which is also resolved.  Pt denies any unilateral weakness, or off balance at this time.  No facial droop or slurred speech noted as well.  Pt is A&O x 4.

## 2015-12-08 NOTE — ED Notes (Signed)
Patient transported to CT 

## 2015-12-08 NOTE — ED Notes (Signed)
CBG 106 

## 2015-12-09 LAB — BASIC METABOLIC PANEL
ANION GAP: 9 (ref 5–15)
BUN: 20 mg/dL (ref 6–20)
CHLORIDE: 107 mmol/L (ref 101–111)
CO2: 23 mmol/L (ref 22–32)
Calcium: 6.2 mg/dL — CL (ref 8.9–10.3)
Creatinine, Ser: 1.74 mg/dL — ABNORMAL HIGH (ref 0.61–1.24)
GFR calc non Af Amer: 38 mL/min — ABNORMAL LOW (ref >60)
GFR, EST AFRICAN AMERICAN: 44 mL/min — AB (ref >60)
GLUCOSE: 107 mg/dL — AB (ref 65–99)
POTASSIUM: 4.9 mmol/L (ref 3.5–5.1)
Sodium: 139 mmol/L (ref 135–145)

## 2015-12-09 NOTE — ED Provider Notes (Addendum)
[facility] DEPT Provider Note   CSN: [id number] Arrival date & time: 12/08/15  1758     History   Chief Complaint Chief Complaint  Patient presents with  . Numbness  . Nausea    HPI [name] Sr. is a 70 y.o. male.  He presents here with complaint of an episode of tingling of his left arm, right arm, and right leg this afternoon. He states it lasted about 10 minutes and has resolved.  He states it happened while he was driving his grandson to get some sport cleats. He had a morning that was normal up until that point. 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 He was driving at the time. He had no difficulty controlling the car. No visual loss or changes. He states things seemed "brighter than usual" but no visual field deficits. He did not feel the need to pull over and his symptoms resolved. No difficulty with speech or swallowing.  Previous left nephrectomy without adjuvant chemotherapy. During surveillance he was found to have recurrence in his lungs and is now under the care of Dr. [name] getting daily oral chemotherapy. No history of  HPI  Past Medical History:  Diagnosis Date  . AAA (abdominal aortic aneurysm) ([place])   . Anxiety   . Asthma   . Cancer [facility])    Renal Cell- 2013; Lung 2017  . Chronic kidney disease    Renal Cell Adenocarcinoma  . Chronic kidney disease    Left Nephrectomy  . COPD (chronic obstructive pulmonary disease) ([place])   . Coronary artery disease   . Depression   . Diabetes mellitus without complication ([place])    Type II  . Eczema   . Encounter for antineoplastic chemotherapy 08/19/2015  . GERD (gastroesophageal reflux disease)   . Headache   . Heart murmur   . HPTH (hyperparathyroidism) ([place])   . Hx of radiation therapy 08/20/15   Right Temporal brain  . Hyperlipidemia   . Hyperparathyroidism ([place])   . Hypertension   .  Hypertension 08/19/2015  . Hypothyroidism   . Malignant neoplasm of left kidney ([place]) 02/09/2013  . Neuropathy ([place])   . Shortness of breath dyspnea   . Sleep apnea    CPAP  . Thoracic aortic aneurysm [facility]) 2017    Patient Active Problem List   Diagnosis Date Noted  . Encounter for antineoplastic chemotherapy 08/19/2015  . Hypertension 08/19/2015  . Brain metastasis ([place]) 08/18/2015  . Chronic obstructive pulmonary disease ([place]) 02/13/2015  . Personal history of other malignant neoplasm of kidney 02/13/2015  . Hyperparathyroidism ([place]) 02/13/2015  . Type 2 diabetes mellitus ([place]) 02/13/2015  . Arteriosclerosis of coronary artery 12/09/2014  . CAFL (chronic airflow limitation) ([place]) 12/09/2014  . Enterogastritis 12/09/2014  . Acid reflux 12/09/2014  . HPTH (hyperparathyroidism) ([place]) 12/09/2014  . BP (high blood pressure) 12/09/2014  . Eczema intertrigo 12/09/2014  . Obstructive apnea 12/09/2014  . COPD, mild ([place]) 09/21/2014  . Atherosclerosis of coronary artery 09/21/2014  . Clinical depression 09/21/2014  . Diabetes mellitus, type 2 ([place]) 09/21/2014  . Essential (primary) hypertension 09/21/2014  . Cardiac murmur 09/21/2014  . HLD (hyperlipidemia) 09/21/2014  . Adult hypothyroidism 09/21/2014  . Adiposity 09/21/2014  . Adenocarcinoma, renal cell ([place]) 09/21/2014  . Malignant neoplasm of left kidney ([place]) 02/09/2013    Past Surgical History:  Procedure Laterality Date  . BACK SURGERY  compressed vertebrae-put in a plate to seprate the spaces  . CARDIAC CATHETERIZATION    . CERVICAL FUSION    . CORONARY ARTERY BYPASS GRAFT     x 5  . ENDOBRONCHIAL ULTRASOUND N/A 06/30/2015   Procedure: ENDOHIAL ULTRASOUND;  Surgeon: Flora Lipps, MD;  Location: [facility] ORS;  Service: Cardiopulmonary;  Laterality: N/A;  . FRACTURE SURGERY     left arm repair  . KNEE SURGERY     left knee repair  . PARATHYROIDECTOMY  12/17/09  . VIDEO BRONCHOSCOPY WITH ENDOBRONCHIAL ULTRASOUND N/A  07/09/2015   Procedure: VIDEO BRONCHOSCOPY WITH ENDOBRONCHIAL ULTRASOUND;  Surgeon: Ivin Poot, MD;  Location: [facility] OR;  Service: Thoracic;  Laterality: N/A;       Home Medications    Prior to Admission medications   Medication Sig Start Date End Date Taking? Authorizing Provider  ALPRAZolam (XANAX) 0.5 MG tablet TAKE ONE TABLET BY MOUTH TWICE DAILY AS NEEDED Patient taking differently: TAKE ONE TABLET BY MOUTH TWICE DAILY AS NEEDED FOR ANXIETY 09/25/15  Yes Richard Maceo Pro., MD  aspirin 81 MG tablet Take 81 mg by mouth at bedtime. Reported on 07/01/2015 05/27/11  Yes Historical Provider, MD  carvedilol (COREG) 25 MG tablet Take 1 tablet (25 mg total) by mouth 2 (two) times daily with a meal. 08/21/15  Yes [name]., MD  Cholecalciferol 1000 UNITS tablet Take 1,000 Units by mouth at bedtime.    Yes Historical Provider, MD  FLUoxetine (PROZAC) 40 MG capsule TAKE ONE CAPSULE BY MOUTH EVERY DAY Patient taking differently: TAKE 40 MG BY MOUTH EVERY DAY 03/25/15  Yes Richard Maceo Pro., MD  loperamide (IMODIUM) 2 MG capsule Take 2 mg by mouth 2 (two) times daily as needed for diarrhea or loose stools.    Yes Historical Provider, MD  losartan (COZAAR) 100 MG tablet TAKE ONE TABLET BY MOUTH EVERY DAY Patient taking differently: TAKE 100 MG BY MOUTH EVERY DAY 03/25/15  Yes [name]., MD  metFORMIN (GLUCOPHAGE) 1000 MG tablet TAKE ONE TABLET BY MOUTH TWICE DAILY 09/25/15  Yes Richard Maceo Pro., MD  mometasone (ELOCON) 0.1 % cream Apply 1 application topically daily. Use daily as directed for up 2 weeks out of the month Patient taking differently: Apply 1 application topically daily as needed (burning).  09/26/15  Yes Richard Maceo Pro., MD  pioglitazone (ACTOS) 45 MG tablet TAKE ONE TABLET BY MOUTH EACH DAY. Patient taking differently: TAKE 45 MG BY MOUTH EACH DAY. 02/03/15  Yes Richard Maceo Pro., MD  prochlorperazine (COMPAZINE) 10 MG tablet TAKE ONE TABLET BY  MOUTH EVERY SIX HOURSAS NEEDED FOR NAUSEA OR VOMITING 10/08/15  Yes Curt Bears, MD  simvastatin (ZOCOR) 40 MG tablet TAKE ONE TABLET BY MOUTH EVERY DAY 08/11/15  Yes Richard Maceo Pro., MD  VOTRIENT 200 MG tablet TAKE 4 TABLETS BY MOUTH DAILY. TAKE ON AN EMPTY STOMACH. Patient taking differently: TAKE 3 TABLETS BY MOUTH DAILY. TAKE ON AN EMPTY STOMACH. 11/10/15  Yes Curt Bears, MD  ADVAIR DISKUS 100-50 MCG/DOSE AEPB INHALE 1 PUFF TWICE A DAY Patient taking differently: INHALE 1 PUFF BY MOUTH TWICE A DAY AS NEEDED FOR SOB 08/08/15   Richard Maceo Pro., MD  albuterol (PROVENTIL HFA;VENTOLIN HFA) 108 (90 BASE) MCG/ACT inhaler Inhale 1 puff into the lungs every 6 (six) hours as needed for wheezing.     Historical Provider, MD  meclizine (ANTIVERT) 25 MG tablet Take 25 mg by mouth daily as  needed for dizziness.  06/18/13   Historical Provider, MD  naproxen (NAPROSYN) 500 MG tablet Take 1 tablet (500 mg total) by mouth 2 (two) times daily with a meal. Patient not taking: Reported on 12/08/2015 08/21/15   [name]., MD  omeprazole (PRILOSEC) 20 MG capsule Take 1 capsule (20 mg total) by mouth every morning. Patient not taking: Reported on 12/08/2015 10/15/15   [name]., MD    Family History Family History  Problem Relation Age of Onset  . Stroke Mother   . Cardiomyopathy Mother   . Hypertension Mother   . Hypertension Father   . Congestive Heart Failure Father   . Heart disease Brother   . Heart attack Brother   . Hypertension Brother   . Diabetes Brother   . Obesity Brother   . Heart disease Brother   . Sleep apnea Son     Social History Social History  Substance Use Topics  . Smoking status: Former Smoker    Packs/day: 1.50    Years: 35.00    Types: Cigarettes    Quit date: 12/24/2001  . Smokeless tobacco: Never Used  . Alcohol use No     Allergies   Amoxicillin; Codeine; Penicillins; and Ace inhibitors   Review of Systems Review of Systems    Constitutional: Negative for appetite change, chills, diaphoresis, fatigue and fever.  HENT: Negative for mouth sores, sore throat and trouble swallowing.   Eyes: Negative for visual disturbance.  Respiratory: Negative for cough, chest tightness, shortness of breath and wheezing.   Cardiovascular: Negative for chest pain.  Gastrointestinal: Negative for abdominal distention, abdominal pain, diarrhea, nausea and vomiting.  Endocrine: Negative for polydipsia, polyphagia and polyuria.  Genitourinary: Negative for dysuria, frequency and hematuria.  Musculoskeletal: Negative for gait problem.  Skin: Negative for color change, pallor and rash.  Neurological: Positive for numbness. Negative for dizziness, syncope, light-headedness and headaches.  Hematological: Does not bruise/bleed easily.  Psychiatric/Behavioral: Negative for behavioral problems and confusion.     Physical Exam Updated Vital Signs BP 108/72 (BP Location: Right Arm)   Pulse 75   Temp 97.7 F (36.5 C) (Oral)   Resp 23   Ht '5\' 11"'$  (1.803 m)   Wt 240 lb (108.9 kg)   SpO2 91%   BMI 33.47 kg/m   Physical Exam  Constitutional: He is oriented to person, place, and time. He appears well-developed and well-nourished. No distress.  HENT:  Head: Normocephalic.  Eyes: Conjunctivae are normal. Pupils are equal, round, and reactive to light. No scleral icterus.  Neck: Normal range of motion. Neck supple. No thyromegaly present.  Cardiovascular: Normal rate and regular rhythm.  Exam reveals no gallop and no friction rub.   No murmur heard. Pulmonary/Chest: Effort normal and breath sounds normal. No respiratory distress. He has no wheezes. He has no rales.  Abdominal: Soft. Bowel sounds are normal. He exhibits no distension. There is no tenderness. There is no rebound.  Musculoskeletal: Normal range of motion.  Neurological: He is alert and oriented to person, place, and time.  Skin: Skin is warm and dry. No rash noted.   Psychiatric: He has a normal mood and affect. His behavior is normal.     ED Treatments / Results  Labs (all labs ordered are listed, but only abnormal results are displayed) Labs Reviewed  BASIC METABOLIC PANEL - Abnormal; Notable for the following:       Result Value   Glucose, Bld 107 (*)  Creatinine, Ser 1.74 (*)    Calcium 6.2 (*)    GFR calc non Af Amer 38 (*)    GFR calc Af Amer 44 (*)    All other components within normal limits  CBC - Abnormal; Notable for the following:    RBC 3.51 (*)    Hemoglobin 10.8 (*)    HCT 32.7 (*)    RDW 20.4 (*)    Platelets 115 (*)    All other components within normal limits  CBG MONITORING, ED - Abnormal; Notable for the following:    Glucose-Capillary 106 (*)    All other components within normal limits  URINALYSIS, ROUTINE W REFLEX MICROSCOPIC (NOT AT [facility])    EKG  EKG Interpretation  Date/Time:  Monday December 08 2015 19:28:24 EDT Ventricular Rate:  72 PR Interval:    QRS Duration: 97 QT Interval:  446 QTC Calculation: 489 R Axis:   4 Text Interpretation:  Sinus rhythm Low voltage, precordial leads Probable LVH with secondary repol abnrm Borderline prolonged QT interval Baseline wander in lead(s) V1 Confirmed by Jeneen Rinks  MD, [place] ([id number]) on 12/08/2015 8:20:07 PM       Radiology Ct Head Wo Contrast  Result Date: 12/08/2015 CLINICAL DATA:  Patient woke with nausea. Earlier today the patient experienced LEFT arm numbness which resolved. EXAM: CT HEAD WITHOUT CONTRAST TECHNIQUE: Contiguous axial images were obtained from the base of the skull through the vertex without intravenous contrast. COMPARISON:  MR brain most recent 11/03/2015. FINDINGS: No evidence for acute infarction, hemorrhage, mass lesion, hydrocephalus, or extra-axial fluid. Mild cerebral and cerebellar atrophy. Chronic microvascular ischemic change. Vascular calcification. Calvarium intact. No sinus or mastoid disease. IMPRESSION: Stable exam.  No acute  intracranial findings. Electronically Signed   By: [name] M.D.   On: 12/08/2015 21:16    Procedures Procedures (including critical care time)  Medications Ordered in ED Medications - No data to display   Initial Impression / Assessment and Plan / ED Course  I have reviewed the triage vital signs and the nursing notes.  Pertinent labs & imaging results that were available during my care of the patient were reviewed by me and considered in my medical decision making (see chart for details).  Clinical Course    Head CT shows no sign of metastasis. No sign of acute CVA. Remains asymptomatic. Doubt TIA as symptoms bilateral. Patient advised to return if any new or worsening symptoms and routine primary care follow-up.  Final Clinical Impressions(s) / ED Diagnoses   Final diagnoses:  Tingling    New Prescriptions Discharge Medication List as of 12/08/2015  9:45 PM       Tanna Furry, MD 12/09/15 [place], MD 01/23/16 [phone]

## 2015-12-15 ENCOUNTER — Other Ambulatory Visit: Payer: Self-pay | Admitting: Internal Medicine

## 2015-12-15 ENCOUNTER — Other Ambulatory Visit: Payer: Self-pay | Admitting: Medical Oncology

## 2015-12-16 ENCOUNTER — Ambulatory Visit (HOSPITAL_COMMUNITY)
Admission: RE | Admit: 2015-12-16 | Discharge: 2015-12-16 | Disposition: A | Discharge status: Home / Self Care | Payer: Medicare Other | Source: Ambulatory Visit | Attending: Family Medicine | Admitting: Family Medicine

## 2015-12-16 ENCOUNTER — Encounter: Payer: Self-pay | Admitting: Internal Medicine

## 2015-12-16 ENCOUNTER — Telehealth: Payer: Self-pay | Admitting: Internal Medicine

## 2015-12-16 ENCOUNTER — Ambulatory Visit (HOSPITAL_BASED_OUTPATIENT_CLINIC_OR_DEPARTMENT_OTHER): Payer: Medicare Other | Admitting: Internal Medicine

## 2015-12-16 VITALS — BP 116/73 | HR 71 | Temp 97.8°F | Resp 16 | Ht 71.0 in | Wt 232.1 lb

## 2015-12-16 DIAGNOSIS — R5383 Other fatigue: Secondary | ICD-10-CM

## 2015-12-16 DIAGNOSIS — Z5111 Encounter for antineoplastic chemotherapy: Secondary | ICD-10-CM

## 2015-12-16 DIAGNOSIS — R197 Diarrhea, unspecified: Secondary | ICD-10-CM

## 2015-12-16 DIAGNOSIS — C642 Malignant neoplasm of left kidney, except renal pelvis: Secondary | ICD-10-CM | POA: Diagnosis not present

## 2015-12-16 DIAGNOSIS — R110 Nausea: Secondary | ICD-10-CM

## 2015-12-16 DIAGNOSIS — C7931 Secondary malignant neoplasm of brain: Secondary | ICD-10-CM

## 2015-12-16 DIAGNOSIS — E860 Dehydration: Secondary | ICD-10-CM | POA: Diagnosis not present

## 2015-12-16 DIAGNOSIS — R531 Weakness: Secondary | ICD-10-CM

## 2015-12-16 DIAGNOSIS — Z85528 Personal history of other malignant neoplasm of kidney: Secondary | ICD-10-CM

## 2015-12-16 HISTORY — DX: Dehydration: E86.0

## 2015-12-16 LAB — COMPREHENSIVE METABOLIC PANEL
ALBUMIN: 3.5 g/dL (ref 3.5–5.0)
ALK PHOS: 63 U/L (ref 40–150)
ALT: 36 U/L (ref 0–55)
AST: 50 U/L — AB (ref 5–34)
Anion Gap: 12 mEq/L — ABNORMAL HIGH (ref 3–11)
BILIRUBIN TOTAL: 1.06 mg/dL (ref 0.20–1.20)
BUN: 23.8 mg/dL (ref 7.0–26.0)
CO2: 21 meq/L — AB (ref 22–29)
Calcium: 6.6 mg/dL — ABNORMAL LOW (ref 8.4–10.4)
Chloride: 108 mEq/L (ref 98–109)
Creatinine: 1.7 mg/dL — ABNORMAL HIGH (ref 0.7–1.3)
EGFR: 40 mL/min/{1.73_m2} — ABNORMAL LOW (ref >90)
GLUCOSE: 87 mg/dL (ref 70–140)
Potassium: 5.7 mEq/L — ABNORMAL HIGH (ref 3.5–5.1)
SODIUM: 140 meq/L (ref 136–145)
TOTAL PROTEIN: 7 g/dL (ref 6.4–8.3)

## 2015-12-16 LAB — CBC WITH DIFFERENTIAL/PLATELET
BASO%: 0.3 % (ref 0.0–2.0)
Basophils Absolute: 0 10*3/uL (ref 0.0–0.1)
EOS ABS: 0.4 10*3/uL (ref 0.0–0.5)
EOS%: 6.1 % (ref 0.0–7.0)
HCT: 34.1 % — ABNORMAL LOW (ref 38.4–49.9)
HEMOGLOBIN: 11.4 g/dL — AB (ref 13.0–17.1)
LYMPH%: 26.6 % (ref 14.0–49.0)
MCH: 31.3 pg (ref 27.2–33.4)
MCHC: 33.4 g/dL (ref 32.0–36.0)
MCV: 93.7 fL (ref 79.3–98.0)
MONO#: 0.7 10*3/uL (ref 0.1–0.9)
MONO%: 10.3 % (ref 0.0–14.0)
NEUT%: 56.7 % (ref 39.0–75.0)
NEUTROS ABS: 3.7 10*3/uL (ref 1.5–6.5)
Platelets: 109 10*3/uL — ABNORMAL LOW (ref 140–400)
RBC: 3.64 10*6/uL — AB (ref 4.20–5.82)
RDW: 19.6 % — AB (ref 11.0–14.6)
WBC: 6.5 10*3/uL (ref 4.0–10.3)
lymph#: 1.7 10*3/uL (ref 0.9–3.3)

## 2015-12-16 LAB — LACTATE DEHYDROGENASE: LDH: 373 U/L — ABNORMAL HIGH (ref 125–245)

## 2015-12-16 MED ORDER — SODIUM CHLORIDE 0.9 % IV SOLN
INTRAVENOUS | Status: AC
  Administered 2015-12-16: 10:00:00 via INTRAVENOUS

## 2015-12-16 MED ORDER — ONDANSETRON HCL 4 MG/2ML IJ SOLN: 8.0000 mg | Freq: Once | INTRAMUSCULAR | Status: DC

## 2015-12-16 MED ORDER — SODIUM CHLORIDE 0.9 % IV SOLN
8.0000 mg | Freq: Once | INTRAVENOUS | Status: AC
  Administered 2015-12-16: 8 mg via INTRAVENOUS
  Filled 2015-12-16: qty 4

## 2015-12-16 NOTE — Progress Notes (Signed)
Provider: Curt Bears MD  Associated Diagnosis: Dehydration, metastatic renal cell carcinoma.  Procedure: Infusion of 1000 ml of 0.9% Normal Saline via PIV and Zofran IVPB infusion for nausea.  Patient tolerated infusion well. Went over discharge instructions and copy given to patient. Alert, oriented and ambulatory at time of discharge. Discharged to home.

## 2015-12-16 NOTE — Discharge Instructions (Signed)
0.9% 1000 ml Normal Saline Infusion and Zofran 8 mg IV piggyback

## 2015-12-16 NOTE — Progress Notes (Signed)
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 This was initially diagnosed as a stage III status post left nephrectomy as well as left adrenalectomy on 03/06/2012. 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  PRIOR THERAPY: Stereotactic radiotherapy to the solitary brain lesion scheduled for 08/20/2015  CURRENT THERAPY: Votrient 800 mg by mouth daily started 08/08/2015. His dose will be reduced to Votrient 600 mg by mouth daily starting 10/09/2015.  INTERVAL HISTORY: Patrick Breed Sr. 70 y.o. male returns to the clinic today for follow-up visit accompanied by his son. The patient is currently on Votrient 600 mg by mouth daily since 10/09/2015 and was tolerating it well except recently when he started having more diarrhea and nausea started earlier today. He lost around 11 pounds in the last few weeks. He is also complaining of increasing fatigue and weakness. He has no chest pain, shortness of breath, cough or hemoptysis. He has no fever or chills. He came today for evaluation and recommendation regarding his condition.  MEDICAL HISTORY: Past Medical History:  Diagnosis Date  . AAA (abdominal aortic aneurysm) ([place])   . Anxiety   . Asthma   . Cancer [facility])    Renal Cell- 2013; Lung 2017  . Chronic kidney disease    Renal Cell Adenocarcinoma  . Chronic kidney disease    Left Nephrectomy  . COPD (chronic obstructive pulmonary disease) ([place])   . Coronary artery disease   . Depression   . Diabetes mellitus without complication ([place])    Type II  . Eczema   . Encounter for antineoplastic chemotherapy 08/19/2015   . GERD (gastroesophageal reflux disease)   . Headache   . Heart murmur   . HPTH (hyperparathyroidism) ([place])   . Hx of radiation therapy 08/20/15   Right Temporal brain  . Hyperlipidemia   . Hyperparathyroidism ([place])   . Hypertension   . Hypertension 08/19/2015  . Hypothyroidism   . Malignant neoplasm of left kidney ([place]) 02/09/2013  . Neuropathy ([place])   . Shortness of breath dyspnea   . Sleep apnea    CPAP  . Thoracic aortic aneurysm ([place]) 2017    ALLERGIES:  is allergic to amoxicillin; codeine; penicillins; and ace inhibitors.  MEDICATIONS:  Current Outpatient Prescriptions  Medication Sig Dispense Refill  . ADVAIR DISKUS 100-50 MCG/DOSE AEPB INHALE 1 PUFF TWICE A DAY (Patient taking differently: INHALE 1 PUFF BY MOUTH TWICE A DAY AS NEEDED FOR SOB) 60 each 12  . albuterol (PROVENTIL HFA;VENTOLIN HFA) 108 (90 BASE) MCG/ACT inhaler Inhale 1 puff into the lungs every 6 (six) hours as needed for wheezing.     Marland Kitchen ALPRAZolam (XANAX) 0.5 MG tablet TAKE ONE TABLET BY MOUTH TWICE DAILY AS NEEDED (Patient taking differently: TAKE ONE TABLET BY MOUTH TWICE DAILY AS NEEDED FOR ANXIETY) 60 tablet 5  . aspirin 81 MG tablet Take 81 mg by mouth at bedtime. Reported on 07/01/2015    . carvedilol (COREG) 25 MG tablet Take 1 tablet (25 mg total) by mouth 2 (two) times daily with a meal. 60 tablet 3  . Cholecalciferol 1000 UNITS tablet Take 1,000 Units by mouth at bedtime.     Marland Kitchen  FLUoxetine (PROZAC) 40 MG capsule TAKE ONE CAPSULE BY MOUTH EVERY DAY (Patient taking differently: TAKE 40 MG BY MOUTH EVERY DAY) 30 capsule 12  . loperamide (IMODIUM) 2 MG capsule Take 2 mg by mouth 2 (two) times daily as needed for diarrhea or loose stools.     Marland Kitchen losartan (COZAAR) 100 MG tablet TAKE ONE TABLET BY MOUTH EVERY DAY (Patient taking differently: TAKE 100 MG BY MOUTH EVERY DAY) 30 tablet 12  . meclizine (ANTIVERT) 25 MG tablet Take 25 mg by mouth daily as needed for dizziness.     . metFORMIN (GLUCOPHAGE) 1000 MG  tablet TAKE ONE TABLET BY MOUTH TWICE DAILY 60 tablet 12  . mometasone (ELOCON) 0.1 % cream Apply 1 application topically daily. Use daily as directed for up 2 weeks out of the month (Patient taking differently: Apply 1 application topically daily as needed (burning). ) 45 g 2  . naproxen (NAPROSYN) 500 MG tablet Take 1 tablet (500 mg total) by mouth 2 (two) times daily with a meal. (Patient not taking: Reported on 12/08/2015) 30 tablet 0  . omeprazole (PRILOSEC) 20 MG capsule Take 1 capsule (20 mg total) by mouth every morning. (Patient not taking: Reported on 12/08/2015) 30 capsule 12  . pioglitazone (ACTOS) 45 MG tablet TAKE ONE TABLET BY MOUTH EACH DAY. (Patient taking differently: TAKE 45 MG BY MOUTH EACH DAY.) 30 tablet 12  . prochlorperazine (COMPAZINE) 10 MG tablet TAKE 1 TABLET EVERY 6 HOURS AS NEEDED FOR NAUSEA AND VOMITING 30 tablet 0  . simvastatin (ZOCOR) 40 MG tablet TAKE ONE TABLET BY MOUTH EVERY DAY 30 tablet 12  . VOTRIENT 200 MG tablet TAKE 4 TABLETS BY MOUTH DAILY. TAKE ON AN EMPTY STOMACH. (Patient taking differently: TAKE 3 TABLETS BY MOUTH DAILY. TAKE ON AN EMPTY STOMACH.) 120 tablet 2   No current facility-administered medications for this visit.     SURGICAL HISTORY:  Past Surgical History:  Procedure Laterality Date  . BACK SURGERY     compressed vertebrae-put in a plate to seprate the spaces  . CARDIAC CATHETERIZATION    . CERVICAL FUSION    . CORONARY ARTERY BYPASS GRAFT     x 5  . ENDOBRONCHIAL ULTRASOUND N/A 06/30/2015   Procedure: ENDOHIAL ULTRASOUND;  Surgeon: Flora Lipps, MD;  Location: [facility] ORS;  Service: Cardiopulmonary;  Laterality: N/A;  . FRACTURE SURGERY     left arm repair  . KNEE SURGERY     left knee repair  . PARATHYROIDECTOMY  12/17/09  . VIDEO BRONCHOSCOPY WITH ENDOBRONCHIAL ULTRASOUND N/A 07/09/2015   Procedure: VIDEO BRONCHOSCOPY WITH ENDOBRONCHIAL ULTRASOUND;  Surgeon: Ivin Poot, MD;  Location: [facility] OR;  Service: Thoracic;  Laterality: N/A;      REVIEW OF SYSTEMS:  Constitutional: positive for fatigue and weight loss Eyes: negative Ears, nose, mouth, throat, and face: negative Respiratory: negative Cardiovascular: negative Gastrointestinal: positive for diarrhea and nausea Genitourinary:negative Integument/breast: negative Hematologic/lymphatic: negative Musculoskeletal:positive for muscle weakness Neurological: negative Behavioral/Psych: negative Endocrine: negative Allergic/Immunologic: negative   PHYSICAL EXAMINATION: General appearance: alert, cooperative, fatigued and no distress Head: Normocephalic, without obvious abnormality, atraumatic Neck: no adenopathy, no JVD, supple, symmetrical, trachea midline and thyroid not enlarged, symmetric, no tenderness/mass/nodules Lymph nodes: Cervical, supraclavicular, and axillary nodes normal. Resp: clear to auscultation bilaterally Back: symmetric, no curvature. ROM normal. No CVA tenderness. Cardio: regular rate and rhythm, S1, S2 normal, no murmur, click, rub or gallop GI: soft, non-tender; bowel sounds normal; no masses,  no organomegaly Extremities: extremities normal, atraumatic, no  cyanosis or edema Neurologic: Alert and oriented X 3, normal strength and tone. Normal symmetric reflexes. Normal coordination and gait  ECOG PERFORMANCE STATUS: 1 - Symptomatic but completely ambulatory  Blood pressure 116/73, pulse 71, temperature 97.8 F (36.6 C), temperature source Oral, resp. rate 16, height '5\' 11"'$  (1.803 m), weight 232 lb 1.6 oz (105.3 kg), SpO2 98 %.  LABORATORY DATA: Lab Results  Component Value Date   WBC 6.5 12/16/2015   HGB 11.4 (L) 12/16/2015   HCT 34.1 (L) 12/16/2015   MCV 93.7 12/16/2015   PLT 109 (L) 12/16/2015      Chemistry      Component Value Date/Time   NA 139 12/08/2015 1950   NA 140 11/13/2015 1359   K 4.9 12/08/2015 1950   K 5.0 11/13/2015 1359   CL 107 12/08/2015 1950   CL 103 01/19/2012 0834   CO2 23 12/08/2015 1950   CO2 21 (L)  11/13/2015 1359   BUN 20 12/08/2015 1950   BUN 24.2 11/13/2015 1359   CREATININE 1.74 (H) 12/08/2015 1950   CREATININE 1.3 11/13/2015 1359   GLU 159 11/21/2013      Component Value Date/Time   CALCIUM 6.2 (LL) 12/08/2015 1950   CALCIUM 6.9 (L) 11/13/2015 1359   ALKPHOS 63 11/11/2015 1304   AST 45 (H) 11/11/2015 1304   ALT 41 11/11/2015 1304   BILITOT 0.95 11/11/2015 1304       RADIOGRAPHIC STUDIES: Ct Head Wo Contrast  Result Date: 12/08/2015 CLINICAL DATA:  Patient woke with nausea. Earlier today the patient experienced LEFT arm numbness which resolved. EXAM: CT HEAD WITHOUT CONTRAST TECHNIQUE: Contiguous axial images were obtained from the base of the skull through the vertex without intravenous contrast. COMPARISON:  MR brain most recent 11/03/2015. FINDINGS: No evidence for acute infarction, hemorrhage, mass lesion, hydrocephalus, or extra-axial fluid. Mild cerebral and cerebellar atrophy. Chronic microvascular ischemic change. Vascular calcification. Calvarium intact. No sinus or mastoid disease. IMPRESSION: Stable exam.  No acute intracranial findings. Electronically Signed   By: [name] M.D.   On: 12/08/2015 21:16    ASSESSMENT AND PLAN: This is a very pleasant 70 years old white male with metastatic renal cell carcinoma currently on treatment with Votrient 800 mg by mouth daily status post 2 months of treatment and he has been tolerating his treatment well except for persistent fatigue. His dose was a change it to Votrient 600 mg by mouth daily since 10/09/2015 and he was tolerating it much better but recently started having more fatigue and weakness as well as diarrhea and nausea. He also lost a lot of weight recently. I recommended for the patient to hold his treatment with Votrient for now until I see him back for follow-up visit in one week with repeat CT scan of the chest, abdomen and pelvis without contrast for restaging of his disease. For the dehydration, I will  arrange for the patient to receive IV fluid with Zofran today for nausea. For diarrhea, he was advised to continue Imodium for now. He would come back for follow-up visit in one month for evaluation and management of any adverse effect of his treatment. The patient was advised to call immediately if he has any concerning symptoms in the interval. The patient voices understanding of current disease status and treatment options and is in agreement with the current care plan.  All questions were answered. The patient knows to call the clinic with any problems, questions or concerns. We can certainly see the patient  much sooner if necessary.  Disclaimer: This note was dictated with voice recognition software. Similar sounding words can inadvertently be transcribed and may not be corrected upon review.

## 2015-12-16 NOTE — Telephone Encounter (Signed)
appt made and avs printed. Central radiology to schedule ct scans

## 2015-12-17 ENCOUNTER — Telehealth: Payer: Self-pay | Admitting: *Deleted

## 2015-12-17 DIAGNOSIS — C649 Malignant neoplasm of unspecified kidney, except renal pelvis: Secondary | ICD-10-CM

## 2015-12-17 NOTE — Progress Notes (Signed)
Call patient with the result and repeat bmet today

## 2015-12-17 NOTE — Telephone Encounter (Signed)
-----   Message from Curt Bears, MD sent at 12/17/2015  8:17 AM EDT ----- Call patient with the result and repeat bmet today

## 2015-12-18 NOTE — Telephone Encounter (Signed)
Message to scheduling for repeat BMET. Called pt lmovm to expect call from scheduling for repeat lab work on 8/25

## 2015-12-19 ENCOUNTER — Telehealth: Payer: Self-pay | Admitting: *Deleted

## 2015-12-19 ENCOUNTER — Encounter (HOSPITAL_COMMUNITY): Payer: Self-pay | Admitting: *Deleted

## 2015-12-19 ENCOUNTER — Telehealth: Payer: Self-pay | Admitting: Internal Medicine

## 2015-12-19 ENCOUNTER — Other Ambulatory Visit: Payer: Medicare Other

## 2015-12-19 ENCOUNTER — Inpatient Hospital Stay (HOSPITAL_COMMUNITY)
Admission: EM | Admit: 2015-12-19 | Discharge: 2015-12-22 | DRG: 641 | Disposition: A | Discharge status: Home / Self Care | Payer: Medicare Other | Attending: Internal Medicine | Admitting: Internal Medicine

## 2015-12-19 ENCOUNTER — Other Ambulatory Visit: Payer: Self-pay

## 2015-12-19 ENCOUNTER — Ambulatory Visit (HOSPITAL_BASED_OUTPATIENT_CLINIC_OR_DEPARTMENT_OTHER): Payer: Medicare Other

## 2015-12-19 DIAGNOSIS — Z905 Acquired absence of kidney: Secondary | ICD-10-CM

## 2015-12-19 DIAGNOSIS — E785 Hyperlipidemia, unspecified: Secondary | ICD-10-CM | POA: Diagnosis present

## 2015-12-19 DIAGNOSIS — F329 Major depressive disorder, single episode, unspecified: Secondary | ICD-10-CM | POA: Diagnosis present

## 2015-12-19 DIAGNOSIS — E1122 Type 2 diabetes mellitus with diabetic chronic kidney disease: Secondary | ICD-10-CM | POA: Diagnosis present

## 2015-12-19 DIAGNOSIS — E876 Hypokalemia: Secondary | ICD-10-CM | POA: Diagnosis present

## 2015-12-19 DIAGNOSIS — I2510 Atherosclerotic heart disease of native coronary artery without angina pectoris: Secondary | ICD-10-CM | POA: Diagnosis present

## 2015-12-19 DIAGNOSIS — C642 Malignant neoplasm of left kidney, except renal pelvis: Secondary | ICD-10-CM | POA: Diagnosis present

## 2015-12-19 DIAGNOSIS — Z7982 Long term (current) use of aspirin: Secondary | ICD-10-CM

## 2015-12-19 DIAGNOSIS — J449 Chronic obstructive pulmonary disease, unspecified: Secondary | ICD-10-CM | POA: Diagnosis present

## 2015-12-19 DIAGNOSIS — E669 Obesity, unspecified: Secondary | ICD-10-CM | POA: Diagnosis present

## 2015-12-19 DIAGNOSIS — K219 Gastro-esophageal reflux disease without esophagitis: Secondary | ICD-10-CM | POA: Diagnosis present

## 2015-12-19 DIAGNOSIS — T451X5A Adverse effect of antineoplastic and immunosuppressive drugs, initial encounter: Secondary | ICD-10-CM | POA: Diagnosis present

## 2015-12-19 DIAGNOSIS — E11649 Type 2 diabetes mellitus with hypoglycemia without coma: Secondary | ICD-10-CM | POA: Diagnosis present

## 2015-12-19 DIAGNOSIS — Z823 Family history of stroke: Secondary | ICD-10-CM

## 2015-12-19 DIAGNOSIS — C649 Malignant neoplasm of unspecified kidney, except renal pelvis: Secondary | ICD-10-CM | POA: Diagnosis present

## 2015-12-19 DIAGNOSIS — Z923 Personal history of irradiation: Secondary | ICD-10-CM | POA: Diagnosis not present

## 2015-12-19 DIAGNOSIS — Z888 Allergy status to other drugs, medicaments and biological substances status: Secondary | ICD-10-CM

## 2015-12-19 DIAGNOSIS — R05 Cough: Secondary | ICD-10-CM

## 2015-12-19 DIAGNOSIS — E1142 Type 2 diabetes mellitus with diabetic polyneuropathy: Secondary | ICD-10-CM | POA: Diagnosis present

## 2015-12-19 DIAGNOSIS — E119 Type 2 diabetes mellitus without complications: Secondary | ICD-10-CM

## 2015-12-19 DIAGNOSIS — Z8249 Family history of ischemic heart disease and other diseases of the circulatory system: Secondary | ICD-10-CM

## 2015-12-19 DIAGNOSIS — E872 Acidosis: Secondary | ICD-10-CM | POA: Diagnosis present

## 2015-12-19 DIAGNOSIS — E860 Dehydration: Secondary | ICD-10-CM | POA: Diagnosis present

## 2015-12-19 DIAGNOSIS — C7931 Secondary malignant neoplasm of brain: Secondary | ICD-10-CM | POA: Diagnosis present

## 2015-12-19 DIAGNOSIS — G4733 Obstructive sleep apnea (adult) (pediatric): Secondary | ICD-10-CM | POA: Diagnosis present

## 2015-12-19 DIAGNOSIS — J410 Simple chronic bronchitis: Secondary | ICD-10-CM

## 2015-12-19 DIAGNOSIS — Z951 Presence of aortocoronary bypass graft: Secondary | ICD-10-CM

## 2015-12-19 DIAGNOSIS — Z885 Allergy status to narcotic agent status: Secondary | ICD-10-CM

## 2015-12-19 DIAGNOSIS — N183 Chronic kidney disease, stage 3 (moderate): Secondary | ICD-10-CM | POA: Diagnosis present

## 2015-12-19 DIAGNOSIS — Z833 Family history of diabetes mellitus: Secondary | ICD-10-CM

## 2015-12-19 DIAGNOSIS — I129 Hypertensive chronic kidney disease with stage 1 through stage 4 chronic kidney disease, or unspecified chronic kidney disease: Secondary | ICD-10-CM | POA: Diagnosis present

## 2015-12-19 DIAGNOSIS — Z7984 Long term (current) use of oral hypoglycemic drugs: Secondary | ICD-10-CM

## 2015-12-19 DIAGNOSIS — F419 Anxiety disorder, unspecified: Secondary | ICD-10-CM | POA: Diagnosis present

## 2015-12-19 DIAGNOSIS — C781 Secondary malignant neoplasm of mediastinum: Secondary | ICD-10-CM | POA: Diagnosis present

## 2015-12-19 DIAGNOSIS — E875 Hyperkalemia: Secondary | ICD-10-CM | POA: Diagnosis present

## 2015-12-19 DIAGNOSIS — Z79899 Other long term (current) drug therapy: Secondary | ICD-10-CM

## 2015-12-19 DIAGNOSIS — K521 Toxic gastroenteritis and colitis: Secondary | ICD-10-CM | POA: Diagnosis present

## 2015-12-19 DIAGNOSIS — Z6832 Body mass index (BMI) 32.0-32.9, adult: Secondary | ICD-10-CM

## 2015-12-19 DIAGNOSIS — R197 Diarrhea, unspecified: Secondary | ICD-10-CM | POA: Diagnosis present

## 2015-12-19 DIAGNOSIS — N179 Acute kidney failure, unspecified: Secondary | ICD-10-CM | POA: Diagnosis not present

## 2015-12-19 DIAGNOSIS — D638 Anemia in other chronic diseases classified elsewhere: Secondary | ICD-10-CM | POA: Diagnosis not present

## 2015-12-19 DIAGNOSIS — E039 Hypothyroidism, unspecified: Secondary | ICD-10-CM | POA: Diagnosis present

## 2015-12-19 DIAGNOSIS — D6959 Other secondary thrombocytopenia: Secondary | ICD-10-CM | POA: Diagnosis present

## 2015-12-19 DIAGNOSIS — D630 Anemia in neoplastic disease: Secondary | ICD-10-CM | POA: Diagnosis present

## 2015-12-19 DIAGNOSIS — E213 Hyperparathyroidism, unspecified: Secondary | ICD-10-CM | POA: Diagnosis present

## 2015-12-19 DIAGNOSIS — Z880 Allergy status to penicillin: Secondary | ICD-10-CM

## 2015-12-19 DIAGNOSIS — I959 Hypotension, unspecified: Secondary | ICD-10-CM

## 2015-12-19 DIAGNOSIS — Z09 Encounter for follow-up examination after completed treatment for conditions other than malignant neoplasm: Secondary | ICD-10-CM

## 2015-12-19 DIAGNOSIS — I10 Essential (primary) hypertension: Secondary | ICD-10-CM

## 2015-12-19 DIAGNOSIS — R059 Cough, unspecified: Secondary | ICD-10-CM

## 2015-12-19 DIAGNOSIS — Z87891 Personal history of nicotine dependence: Secondary | ICD-10-CM

## 2015-12-19 LAB — URINALYSIS, ROUTINE W REFLEX MICROSCOPIC
GLUCOSE, UA: NEGATIVE mg/dL
Hgb urine dipstick: NEGATIVE
KETONES UR: NEGATIVE mg/dL
NITRITE: NEGATIVE
PROTEIN: NEGATIVE mg/dL
Specific Gravity, Urine: 1.019 (ref 1.005–1.030)
pH: 5 (ref 5.0–8.0)

## 2015-12-19 LAB — CBC WITH DIFFERENTIAL/PLATELET
BASOS PCT: 0 %
Basophils Absolute: 0 10*3/uL (ref 0.0–0.1)
EOS ABS: 0.5 10*3/uL (ref 0.0–0.7)
EOS PCT: 7 %
HCT: 32.2 % — ABNORMAL LOW (ref 39.0–52.0)
HEMOGLOBIN: 10.7 g/dL — AB (ref 13.0–17.0)
LYMPHS ABS: 1.9 10*3/uL (ref 0.7–4.0)
Lymphocytes Relative: 30 %
MCH: 31.5 pg (ref 26.0–34.0)
MCHC: 33.2 g/dL (ref 30.0–36.0)
MCV: 94.7 fL (ref 78.0–100.0)
MONO ABS: 0.6 10*3/uL (ref 0.1–1.0)
MONOS PCT: 9 %
Neutro Abs: 3.5 10*3/uL (ref 1.7–7.7)
Neutrophils Relative %: 54 %
PLATELETS: 123 10*3/uL — AB (ref 150–400)
RBC: 3.4 MIL/uL — ABNORMAL LOW (ref 4.22–5.81)
RDW: 19.7 % — AB (ref 11.5–15.5)
WBC: 6.5 10*3/uL (ref 4.0–10.5)

## 2015-12-19 LAB — COMPREHENSIVE METABOLIC PANEL
ALBUMIN: 3.6 g/dL (ref 3.5–5.0)
ALK PHOS: 53 U/L (ref 38–126)
ALT: 29 U/L (ref 17–63)
ANION GAP: 8 (ref 5–15)
AST: 41 U/L (ref 15–41)
BUN: 33 mg/dL — ABNORMAL HIGH (ref 6–20)
CALCIUM: 5.4 mg/dL — AB (ref 8.9–10.3)
CO2: 19 mmol/L — AB (ref 22–32)
Chloride: 111 mmol/L (ref 101–111)
Creatinine, Ser: 2.36 mg/dL — ABNORMAL HIGH (ref 0.61–1.24)
GFR calc non Af Amer: 26 mL/min — ABNORMAL LOW (ref >60)
GFR, EST AFRICAN AMERICAN: 30 mL/min — AB (ref >60)
Glucose, Bld: 83 mg/dL (ref 65–99)
POTASSIUM: 5.6 mmol/L — AB (ref 3.5–5.1)
SODIUM: 138 mmol/L (ref 135–145)
Total Bilirubin: 0.9 mg/dL (ref 0.3–1.2)
Total Protein: 6.5 g/dL (ref 6.5–8.1)

## 2015-12-19 LAB — URINE MICROSCOPIC-ADD ON: RBC / HPF: NONE SEEN RBC/hpf (ref 0–5)

## 2015-12-19 LAB — BASIC METABOLIC PANEL
Anion Gap: 10 mEq/L (ref 3–11)
BUN: 30.4 mg/dL — AB (ref 7.0–26.0)
CHLORIDE: 112 meq/L — AB (ref 98–109)
CO2: 19 meq/L — AB (ref 22–29)
CREATININE: 2.3 mg/dL — AB (ref 0.7–1.3)
EGFR: 28 mL/min/{1.73_m2} — ABNORMAL LOW (ref >90)
GLUCOSE: 110 mg/dL (ref 70–140)
Potassium: 5.8 mEq/L — ABNORMAL HIGH (ref 3.5–5.1)
SODIUM: 140 meq/L (ref 136–145)

## 2015-12-19 MED ORDER — SODIUM CHLORIDE 0.9 % IV BOLUS (SEPSIS)
500.0000 mL | Freq: Once | INTRAVENOUS | Status: AC
  Administered 2015-12-19: 500 mL via INTRAVENOUS

## 2015-12-19 MED ORDER — ALBUTEROL SULFATE (2.5 MG/3ML) 0.083% IN NEBU: 2.5000 mg | INHALATION_SOLUTION | Freq: Four times a day (QID) | RESPIRATORY_TRACT | Status: DC | PRN

## 2015-12-19 MED ORDER — CALCIUM CARBONATE 1250 (500 CA) MG PO TABS
1.0000 | ORAL_TABLET | Freq: Two times a day (BID) | ORAL | Status: DC
  Administered 2015-12-20 – 2015-12-22 (×5): 500 mg via ORAL
  Filled 2015-12-19 (×5): qty 1

## 2015-12-19 MED ORDER — LOPERAMIDE HCL 2 MG PO CAPS
2.0000 mg | ORAL_CAPSULE | Freq: Two times a day (BID) | ORAL | Status: DC | PRN
  Administered 2015-12-20 – 2015-12-22 (×4): 2 mg via ORAL
  Filled 2015-12-19 (×5): qty 1

## 2015-12-19 MED ORDER — SODIUM CHLORIDE 0.9% FLUSH
3.0000 mL | Freq: Two times a day (BID) | INTRAVENOUS | Status: DC
  Administered 2015-12-21 – 2015-12-22 (×3): 3 mL via INTRAVENOUS

## 2015-12-19 MED ORDER — SODIUM CHLORIDE 0.9 % IV SOLN
INTRAVENOUS | Status: DC
  Administered 2015-12-19: 20:00:00 via INTRAVENOUS

## 2015-12-19 MED ORDER — FLUOXETINE HCL 20 MG PO CAPS
40.0000 mg | ORAL_CAPSULE | Freq: Every day | ORAL | Status: DC
  Administered 2015-12-20 – 2015-12-22 (×3): 40 mg via ORAL
  Filled 2015-12-19 (×3): qty 2

## 2015-12-19 MED ORDER — SODIUM CHLORIDE 0.9 % IV SOLN: 1.0000 g | Freq: Once | INTRAVENOUS | Status: DC

## 2015-12-19 MED ORDER — ENOXAPARIN SODIUM 60 MG/0.6ML ~~LOC~~ SOLN
50.0000 mg | SUBCUTANEOUS | Status: DC
Start: 2015-12-20 — End: 2015-12-22
  Administered 2015-12-20 – 2015-12-22 (×3): 50 mg via SUBCUTANEOUS
  Filled 2015-12-19 (×3): qty 0.6

## 2015-12-19 MED ORDER — ONDANSETRON HCL 4 MG/2ML IJ SOLN
4.0000 mg | Freq: Four times a day (QID) | INTRAMUSCULAR | Status: DC | PRN
  Administered 2015-12-21: 4 mg via INTRAVENOUS
  Filled 2015-12-19: qty 2

## 2015-12-19 MED ORDER — SODIUM CHLORIDE 0.9 % IV SOLN
INTRAVENOUS | Status: AC
  Administered 2015-12-20 (×2): via INTRAVENOUS

## 2015-12-19 MED ORDER — ONDANSETRON HCL 4 MG/2ML IJ SOLN: 4.0000 mg | INTRAMUSCULAR | Status: DC | PRN

## 2015-12-19 MED ORDER — ACETAMINOPHEN 325 MG PO TABS: 650.0000 mg | ORAL_TABLET | Freq: Four times a day (QID) | ORAL | Status: DC | PRN

## 2015-12-19 MED ORDER — ONDANSETRON HCL 4 MG PO TABS: 4.0000 mg | ORAL_TABLET | Freq: Four times a day (QID) | ORAL | Status: DC | PRN

## 2015-12-19 MED ORDER — ACETAMINOPHEN 650 MG RE SUPP: 650.0000 mg | Freq: Four times a day (QID) | RECTAL | Status: DC | PRN

## 2015-12-19 MED ORDER — ALPRAZOLAM 0.5 MG PO TABS: 0.5000 mg | ORAL_TABLET | Freq: Two times a day (BID) | ORAL | Status: DC | PRN

## 2015-12-19 MED ORDER — SODIUM CHLORIDE 0.9 % IV SOLN
1.0000 g | Freq: Once | INTRAVENOUS | Status: AC
  Administered 2015-12-19: 1 g via INTRAVENOUS
  Filled 2015-12-19: qty 10

## 2015-12-19 MED ORDER — VITAMIN D 1000 UNITS PO TABS
1000.0000 [IU] | ORAL_TABLET | Freq: Every day | ORAL | Status: DC
  Administered 2015-12-20 – 2015-12-21 (×3): 1000 [IU] via ORAL
  Filled 2015-12-19 (×3): qty 1

## 2015-12-19 MED ORDER — INSULIN ASPART 100 UNIT/ML ~~LOC~~ SOLN
0.0000 [IU] | SUBCUTANEOUS | Status: DC
  Administered 2015-12-22: 1 [IU] via SUBCUTANEOUS

## 2015-12-19 MED ORDER — SIMVASTATIN 40 MG PO TABS
40.0000 mg | ORAL_TABLET | Freq: Every day | ORAL | Status: DC
  Administered 2015-12-20 – 2015-12-22 (×3): 40 mg via ORAL
  Filled 2015-12-19 (×3): qty 1

## 2015-12-19 MED ORDER — ASPIRIN 81 MG PO CHEW
81.0000 mg | CHEWABLE_TABLET | Freq: Every day | ORAL | Status: DC
  Administered 2015-12-20 – 2015-12-21 (×3): 81 mg via ORAL
  Filled 2015-12-19 (×3): qty 1

## 2015-12-19 MED ORDER — SODIUM CHLORIDE 0.9 % IV SOLN
1.0000 g | Freq: Once | INTRAVENOUS | Status: AC
  Administered 2015-12-20: 1 g via INTRAVENOUS
  Filled 2015-12-19: qty 10

## 2015-12-19 NOTE — ED Triage Notes (Signed)
Pt states he was told to come to ED due to potassium of 5.8 and calcium of 5.8. Pt states he feels weak and has increased sensitivity to light.

## 2015-12-19 NOTE — H&P (Signed)
Patrick PONS Sr. GYJ:[id number] DOB: 10/28/1945 DOA: 12/19/2015     PCP: [name], MD   [place]   Patient coming from:   home Lives  With family    Chief Complaint: Patient was told to come because his labs are abnormal  HPI: [name]. is a 70 y.o. 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 currently on oral chemotherapy, nephrectomy, CAD, CKD, COPD, depression, DM type II, hyperparathyroidism, AAA, HTN, OSA on C Pap    Presented with increased fatigue and poor by mouth intake for the past few days he was evaluated at oncology office 126 and 5 August and received IV fluids his oral chemotherapy has been held  Asian has been endorsing some nausea and occasional diarrhea which has been treated with Imodium She has had chronic low calcium values but recently has been trending down see med done on the 22nd showed calcium of 5.8 and potassium of 5.8 at which point his oncologist told the patient to come to emergency department. 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 This was initially diagnosed as a stage III status post left nephrectomy as well as left adrenalectomy on 03/06/2012. Then developed large left lower lobe lung mass in addition to large left hilar and mediastinal lymphadenopathy as well as bilateral pulmonary nodules and solitary metastatic brain lesion diagnosed in March 2017. Stereotactic radiotherapy to the solitary brain lesion scheduled for 08/20/2015 patient is currently on Votrient 600 mg by mouth daily since 10/09/2015 and was tolerating it well except for some GI complaints Regarding Diabetes patient has lost a lot of weight and would like to come off him DM medications. Regarding COPD he is not using Advair  consistently states he has been doing well have not needed rescue inhaler for over 1 year.  IN ER:  Temp (24hrs), Avg:97.8 F (36.6 C), Min:97.8 F (36.6 C), Max:97.8 F (36.6 C)      Noted to be hypotensive with blood pressure down to 88/56 now after IVF up to 112/72  heart rate 68 hemoglobin 10.7 which is close to his baseline Potassium 5.6 creatinine 2.36 which is up from baseline 1.7 a few days ago calcium down to 5.4  Following Medications were ordered in ER: Medications  0.9 %  sodium chloride infusion ( Intravenous New Bag/Given 12/19/15 1934)  ondansetron (ZOFRAN) injection 4 mg (not administered)  calcium gluconate 1 g in sodium chloride 0.9 % 100 mL IVPB (1 g Intravenous New Bag/Given 12/19/15 2117)  sodium chloride 0.9 % bolus 500 mL (0 mLs Intravenous Stopped 12/19/15 2009)  sodium chloride 0.9 % bolus 500 mL (500 mLs Intravenous New Bag/Given 12/19/15 2054)    Hospitalist was called for admission forDehydration and acute on chronic renal failure and hyperkalemia with hypocalcemia  Review of Systems:    Pertinent positives include:  fatigue, weight loss decreased by mouth intake  Constitutional:  No weight loss, night sweats, Fevers, chills,  nausea, vomiting, diarrhea, poor appetite HEENT:  No headaches, Difficulty swallowing,Tooth/dental problems,Sore throat,  No sneezing, itching, ear ache, nasal congestion, post nasal drip,  Cardio-vascular:  No chest pain, Orthopnea, PND, anasarca, dizziness, palpitations.no Bilateral lower extremity swelling  GI:  No heartburn, indigestion, abdominal pain,change in bowel habits, loss of appetite, melena, blood in stool, hematemesis Resp:  no shortness of  breath at rest. No dyspnea on exertion, No excess mucus, no productive cough, No non-productive cough, No coughing up of blood.No change in color of mucus.No wheezing. Skin:  no rash or lesions. No jaundice GU:  no dysuria, change in color of urine, no urgency or frequency. No  straining to urinate.  No flank pain.  Musculoskeletal:  No joint pain or no joint swelling. No decreased range of motion. No back pain.  Psych:  No change in mood or affect. No depression or anxiety. No memory loss.  Neuro: no localizing neurological complaints, no tingling, no weakness, no double vision, no gait abnormality, no slurred speech, no confusion  As per HPI otherwise 10 point review of systems negative.   Past Medical History: Past Medical History:  Diagnosis Date  . AAA (abdominal aortic aneurysm) ([place])   . Anxiety   . Asthma   . Cancer [facility])    Renal Cell- 2013; Lung 2017  . Chronic kidney disease    Renal Cell Adenocarcinoma  . Chronic kidney disease    Left Nephrectomy  . COPD (chronic obstructive pulmonary disease) ([place])   . Coronary artery disease   . Dehydration 12/16/2015  . Depression   . Diabetes mellitus without complication ([place])    Type II  . Eczema   . Encounter for antineoplastic chemotherapy 08/19/2015  . GERD (gastroesophageal reflux disease)   . Headache   . Heart murmur   . HPTH (hyperparathyroidism) ([place])   . Hx of radiation therapy 08/20/15   Right Temporal brain  . Hyperlipidemia   . Hyperparathyroidism ([place])   . Hypertension   . Hypertension 08/19/2015  . Hypothyroidism   . Malignant neoplasm of left kidney ([place]) 02/09/2013  . Neuropathy ([place])   . Shortness of breath dyspnea   . Sleep apnea    CPAP  . Thoracic aortic aneurysm ([place]) 2017   Past Surgical History:  Procedure Laterality Date  . BACK SURGERY     compressed vertebrae-put in a plate to seprate the spaces  . CARDIAC CATHETERIZATION    . CERVICAL FUSION    . CORONARY ARTERY BYPASS GRAFT     x 5  . ENDOBRONCHIAL ULTRASOUND N/A 06/30/2015   Procedure: ENDOHIAL ULTRASOUND;  Surgeon: Flora Lipps, MD;  Location: [facility] ORS;  Service: Cardiopulmonary;  Laterality: N/A;  . FRACTURE SURGERY     left arm repair  . KNEE SURGERY     left knee repair  . PARATHYROIDECTOMY  12/17/09    . VIDEO BRONCHOSCOPY WITH ENDOBRONCHIAL ULTRASOUND N/A 07/09/2015   Procedure: VIDEO BRONCHOSCOPY WITH ENDOBRONCHIAL ULTRASOUND;  Surgeon: Ivin Poot, MD;  Location: [facility] OR;  Service: Thoracic;  Laterality: N/A;     Social History:  Ambulatory   Independently     reports that he quit smoking about 13 years ago. His smoking use included Cigarettes. He has a 52.50 pack-year smoking history. He has never used smokeless tobacco. He reports that he does not drink alcohol or use drugs.  Allergies:   Allergies  Allergen Reactions  . Amoxicillin Rash and Other (See Comments)    Other reaction(s): ANAPHYLAXIS  . Codeine Rash and Other (See Comments)    Other reaction(s): ANAPHYLAXIS  . Penicillins Rash and Other (See Comments)    Other reaction(s): ANAPHYLAXIS Has patient had a PCN reaction causing immediate rash,    . Ace Inhibitors Cough       Family History:   Family History  Problem Relation Age of Onset  . Stroke Mother   .  Cardiomyopathy Mother   . Hypertension Mother   . Hypertension Father   . Congestive Heart Failure Father   . Heart disease Brother   . Heart attack Brother   . Hypertension Brother   . Diabetes Brother   . Obesity Brother   . Heart disease Brother   . Sleep apnea Son     Medications: Prior to Admission medications   Medication Sig Start Date End Date Taking? Authorizing Provider  ALPRAZolam (XANAX) 0.5 MG tablet TAKE ONE TABLET BY MOUTH TWICE DAILY AS NEEDED Patient taking differently: TAKE ONE TABLET BY MOUTH TWICE DAILY AS NEEDED FOR ANXIETY 09/25/15  Yes Richard Maceo Pro., MD  aspirin 81 MG tablet Take 81 mg by mouth at bedtime. Reported on 07/01/2015 05/27/11  Yes Historical Provider, MD  carvedilol (COREG) 25 MG tablet Take 1 tablet (25 mg total) by mouth 2 (two) times daily with a meal. 08/21/15  Yes [name]., MD  Cholecalciferol 1000 UNITS tablet Take 1,000 Units by mouth at bedtime.    Yes Historical Provider, MD   FLUoxetine (PROZAC) 40 MG capsule TAKE ONE CAPSULE BY MOUTH EVERY DAY Patient taking differently: TAKE 40 MG BY MOUTH EVERY DAY 03/25/15  Yes Richard Maceo Pro., MD  loperamide (IMODIUM) 2 MG capsule Take 2 mg by mouth 2 (two) times daily as needed for diarrhea or loose stools.    Yes Historical Provider, MD  losartan (COZAAR) 100 MG tablet TAKE ONE TABLET BY MOUTH EVERY DAY Patient taking differently: TAKE 100 MG BY MOUTH EVERY DAY 03/25/15  Yes [name]., MD  metFORMIN (GLUCOPHAGE) 1000 MG tablet TAKE ONE TABLET BY MOUTH TWICE DAILY 09/25/15  Yes Richard Maceo Pro., MD  pioglitazone (ACTOS) 45 MG tablet TAKE ONE TABLET BY MOUTH EACH DAY. Patient taking differently: TAKE 45 MG BY MOUTH EACH DAY. 02/03/15  Yes Richard Maceo Pro., MD  prochlorperazine (COMPAZINE) 10 MG tablet TAKE 1 TABLET EVERY 6 HOURS AS NEEDED FOR NAUSEA AND VOMITING 12/15/15  Yes Curt Bears, MD  simvastatin (ZOCOR) 40 MG tablet TAKE ONE TABLET BY MOUTH EVERY DAY 08/11/15  Yes Richard Maceo Pro., MD  VOTRIENT 200 MG tablet TAKE 4 TABLETS BY MOUTH DAILY. TAKE ON AN EMPTY STOMACH. Patient taking differently: TAKE 3 TABLETS BY MOUTH DAILY. TAKE ON AN EMPTY STOMACH. 11/10/15  Yes Curt Bears, MD  ADVAIR DISKUS 100-50 MCG/DOSE AEPB INHALE 1 PUFF TWICE A DAY Patient taking differently: INHALE 1 PUFF BY MOUTH TWICE A DAY AS NEEDED FOR SOB 08/08/15   Richard Maceo Pro., MD  albuterol (PROVENTIL HFA;VENTOLIN HFA) 108 (90 BASE) MCG/ACT inhaler Inhale 1 puff into the lungs every 6 (six) hours as needed for wheezing.     Historical Provider, MD  meclizine (ANTIVERT) 25 MG tablet Take 25 mg by mouth daily as needed for dizziness.  06/18/13   Historical Provider, MD  mometasone (ELOCON) 0.1 % cream Apply 1 application topically daily. Use daily as directed for up 2 weeks out of the month Patient taking differently: Apply 1 application topically daily as needed (burning).  09/26/15   Richard Maceo Pro., MD   naproxen (NAPROSYN) 500 MG tablet Take 1 tablet (500 mg total) by mouth 2 (two) times daily with a meal. Patient not taking: Reported on 12/08/2015 08/21/15   [name]., MD  omeprazole (PRILOSEC) 20 MG capsule Take 1 capsule (20 mg total) by mouth every morning. Patient not taking: Reported on 12/08/2015 10/15/15  Richard Maceo Pro., MD    Physical Exam: Patient Vitals for the past 24 hrs:  BP Temp Temp src Pulse Resp SpO2 Height Weight  12/19/15 2009 (!) 88/56 - - 69 18 97 % - -  12/19/15 1930 (!) 90/52 - - 72 18 96 % - -  12/19/15 1834 99/63 - - 70 16 100 % 5' 11.5" (1.816 m) 104.3 kg (230 lb)  12/19/15 1755 - - - - - - '5\' 11"'$  (1.803 m) 105.2 kg (232 lb)  12/19/15 1752 99/76 97.8 F (36.6 C) Oral 75 18 100 % - -    1. General:  in No Acute distress 2. Psychological: Alert and  Oriented 3. Head/ENT:    Dry Mucous Membranes                          Head Non traumatic, neck supple                          Normal   Dentition 4. SKIN:  decreased Skin turgor,  Skin clean Dry and intact no rash, pale 5. Heart: Regular rate and rhythm no  Murmur, Rub or gallop 6. Lungs:  Clear to auscultation bilaterally, no wheezes or crackles   7. Abdomen: Soft, non-tender, Non distended 8. Lower extremities: no clubbing, cyanosis, or edema 9. Neurologically Grossly intact, moving all 4 extremities equally   10. MSK: Normal range of motion   body mass index is 31.63 kg/m.  Labs on Admission:   Labs on Admission: I have personally reviewed following labs and imaging studies  CBC:  Recent Labs Lab 12/16/15 0828 12/19/15 1847  WBC 6.5 6.5  NEUTROABS 3.7 3.5  HGB 11.4* 10.7*  HCT 34.1* 32.2*  MCV 93.7 94.7  PLT 109* 654*   Basic Metabolic Panel:  Recent Labs Lab 12/16/15 0828 12/19/15 1549 12/19/15 1847  NA 140 140 138  K 5.7 No visable hemolysis* 5.8 No visable hemolysis* 5.6*  CL  --   --  111  CO2 21* 19* 19*  GLUCOSE 87 110 83  BUN 23.8 30.4* 33*  CREATININE  1.7* 2.3* 2.36*  CALCIUM 6.6* 5.8 Repeated and Verified* 5.4*   GFR: Estimated Creatinine Clearance: 36.1 mL/min (by C-G formula based on SCr of 2.36 mg/dL). Liver Function Tests:  Recent Labs Lab 12/16/15 0828 12/19/15 1847  AST 50* 41  ALT 36 29  ALKPHOS 63 53  BILITOT 1.06 0.9  PROT 7.0 6.5  ALBUMIN 3.5 3.6   No results for input(s): LIPASE, AMYLASE in the last 168 hours. No results for input(s): AMMONIA in the last 168 hours. Coagulation Profile: No results for input(s): INR, PROTIME in the last 168 hours. Cardiac Enzymes: No results for input(s): CKTOTAL, CKMB, CKMBINDEX, TROPONINI in the last 168 hours. BNP (last 3 results) No results for input(s): PROBNP in the last 8760 hours. HbA1C: No results for input(s): HGBA1C in the last 72 hours. CBG: No results for input(s): GLUCAP in the last 168 hours. Lipid Profile: No results for input(s): CHOL, HDL, LDLCALC, TRIG, CHOLHDL, LDLDIRECT in the last 72 hours. Thyroid Function Tests: No results for input(s): TSH, T4TOTAL, FREET4, T3FREE, THYROIDAB in the last 72 hours. Anemia Panel: No results for input(s): VITAMINB12, FOLATE, FERRITIN, TIBC, IRON, RETICCTPCT in the last 72 hours.  Sepsis Labs: '@LABRCNTIP'$ (procalcitonin:4,lacticidven:4) )No results found for this or any previous visit (from the past 240 hour(s)).    UA  ordered  Lab Results  Component Value Date   HGBA1C 7.5 10/15/2015    Estimated Creatinine Clearance: 36.1 mL/min (by C-G formula based on SCr of 2.36 mg/dL).  BNP (last 3 results) No results for input(s): PROBNP in the last 8760 hours.   ECG REPORT  Independently reviewed Rate: 65   Rhythm: sinus rhymth ST&T Change: No acute ischemic changes   QTC  474 QT 455  Filed Weights   12/19/15 1755 12/19/15 1834  Weight: 105.2 kg (232 lb) 104.3 kg (230 lb)     Cultures: No results found for: SDES, SPECREQUEST, CULT, REPTSTATUS   Radiological Exams on Admission: No results found.  Chart has  been reviewed    Assessment/Plan  70 y.o. 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 Hyperkalemia - mild to monitor on telemetry we'll recheck after has been rehydrated to see if the level has decreased . Adenocarcinoma, renal cell ([place]) - will need to notify oncology patient has been admitted in the morning . Brain metastasis ([place])  stable status post radiation therapy . Chronic obstructive pulmonary disease (HCC) has been doing pretty well. Will hold off on Advair for now  . Dehydration administrated fluids . Essential (primary) hypertension given hypotension will hold blood pressure medications . Obstructive apnea CPAP worker . Hypocalcemia - replete IV 2 g and then start by mouth at PTH level and vitamin D   . 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 Acute renal failure (ARF) (HCC) continue to rehydrate patient at baseline has chronic kidney disease baseline creatinine 1.3 diabetes mellitus order sliding scale hold by mouth medications patient states he has lost a lot of weight and wondering if he can come off his home medications altogether Other plan as per orders.  DVT prophylaxis:    Lovenox     Code Status:  FULL CODE  as per patient    Family Communication:   Family not at  Bedside    Disposition Plan:      To home once workup is complete and patient is stable                                Consults called: None   Admission status:    inpatient      Level of care    tele          I have spent a total of 56 min on this admission    Emmakate Hypes 12/19/2015, 10:00 PM    [facility]  Pager [phone]   after 2 AM please  page floor coverage PA If 7AM-7PM, please contact the day team taking care of the patient  [email or web address]  Password [email or web address]

## 2015-12-19 NOTE — ED Notes (Signed)
Ca+ 5.4 reported by lab, Dr. Thurnell Garbe and Leone Haven D,RN notified

## 2015-12-19 NOTE — Telephone Encounter (Signed)
Pt called inquiring about recent messages he received about lab appt. Explained pt will need to come in today to repeat BMEt as his potassium was elevated. Pt advised he can still come this afternoon. Message to scheduling to add pt onto lab today.

## 2015-12-19 NOTE — ED Provider Notes (Signed)
[facility] DEPT Provider Note   CSN: [id number] Arrival date & time: 12/19/15  1735     History   Chief Complaint Chief Complaint  Patient presents with  . Abnormal Lab    HPI [name]. is a 70 y.o. male.  HPI  Pt was seen at 1830.  Per pt, c/o unknown onset and persistence of constant "abnormal labs" since lab draw earlier today. Pt states he received IVF 3 days ago by his Onc MD for "dehydration." Pt states he takes daily PO chemo which leaves him nauseated with poor appetite. Pt states he had routine labs drawn today and "was told my potassium and calcium were abnormal." Has been associated with generalized weakness/fatigue. Denies fevers, no rash, no CP/SOB, no abd pain, no vomiting/diarrhea, no focal motor weakness, no tingling/numbness in extremities.    Past Medical History:  Diagnosis Date  . AAA (abdominal aortic aneurysm) ([place])   . Anxiety   . Asthma   . Cancer [facility])    Renal Cell- 2013; Lung 2017  . Chronic kidney disease    Renal Cell Adenocarcinoma  . Chronic kidney disease    Left Nephrectomy  . COPD (chronic obstructive pulmonary disease) ([place])   . Coronary artery disease   . Dehydration 12/16/2015  . Depression   . Diabetes mellitus without complication ([place])    Type II  . Eczema   . Encounter for antineoplastic chemotherapy 08/19/2015  . GERD (gastroesophageal reflux disease)   . Headache   . Heart murmur   . HPTH (hyperparathyroidism) ([place])   . Hx of radiation therapy 08/20/15   Right Temporal brain  . Hyperlipidemia   . Hyperparathyroidism ([place])   . Hypertension   . Hypertension 08/19/2015  . Hypothyroidism   . Malignant neoplasm of left kidney ([place]) 02/09/2013  . Neuropathy ([place])   . Shortness of breath dyspnea   . Sleep apnea    CPAP  . Thoracic aortic aneurysm [facility]) 2017    Patient Active Problem List   Diagnosis Date Noted  . Dehydration 12/16/2015  . Encounter for antineoplastic chemotherapy 08/19/2015  . Hypertension  08/19/2015  . Brain metastasis ([place]) 08/18/2015  . Chronic obstructive pulmonary disease ([place]) 02/13/2015  . Personal history of other malignant neoplasm of kidney 02/13/2015  . Hyperparathyroidism ([place]) 02/13/2015  . Type 2 diabetes mellitus ([place]) 02/13/2015  . Arteriosclerosis of coronary artery 12/09/2014  . CAFL (chronic airflow limitation) ([place]) 12/09/2014  . Enterogastritis 12/09/2014  . Acid reflux 12/09/2014  . HPTH (hyperparathyroidism) ([place]) 12/09/2014  . BP (high blood pressure) 12/09/2014  . Eczema intertrigo 12/09/2014  . Obstructive apnea 12/09/2014  . COPD, mild ([place]) 09/21/2014  . Atherosclerosis of coronary artery 09/21/2014  . Clinical depression 09/21/2014  . Diabetes mellitus, type 2 ([place]) 09/21/2014  . Essential (primary) hypertension 09/21/2014  . Cardiac murmur 09/21/2014  . HLD (hyperlipidemia) 09/21/2014  . Adult hypothyroidism 09/21/2014  . Adiposity 09/21/2014  . Adenocarcinoma, renal cell ([place]) 09/21/2014  . Malignant neoplasm of left kidney ([place]) 02/09/2013    Past Surgical History:  Procedure Laterality Date  . BACK SURGERY     compressed vertebrae-put in a plate to seprate the spaces  . CARDIAC CATHETERIZATION    . CERVICAL FUSION    . CORONARY ARTERY BYPASS GRAFT     x 5  . ENDOBRONCHIAL ULTRASOUND N/A 06/30/2015   Procedure: ENDOHIAL ULTRASOUND;  Surgeon: Flora Lipps, MD;  Location: [facility] ORS;  Service: Cardiopulmonary;  Laterality: N/A;  . FRACTURE SURGERY  left arm repair  . KNEE SURGERY     left knee repair  . PARATHYROIDECTOMY  12/17/09  . VIDEO BRONCHOSCOPY WITH ENDOBRONCHIAL ULTRASOUND N/A 07/09/2015   Procedure: VIDEO BRONCHOSCOPY WITH ENDOBRONCHIAL ULTRASOUND;  Surgeon: Ivin Poot, MD;  Location: [facility];  Service: Thoracic;  Laterality: N/A;       Home Medications    Prior to Admission medications   Medication Sig Start Date End Date Taking? Authorizing Provider  ADVAIR DISKUS 100-50 MCG/DOSE AEPB INHALE 1 PUFF TWICE A  DAY Patient taking differently: INHALE 1 PUFF BY MOUTH TWICE A DAY AS NEEDED FOR SOB 08/08/15   Richard Maceo Pro., MD  albuterol (PROVENTIL HFA;VENTOLIN HFA) 108 (90 BASE) MCG/ACT inhaler Inhale 1 puff into the lungs every 6 (six) hours as needed for wheezing.     Historical Provider, MD  ALPRAZolam Duanne Moron) 0.5 MG tablet TAKE ONE TABLET BY MOUTH TWICE DAILY AS NEEDED Patient taking differently: TAKE ONE TABLET BY MOUTH TWICE DAILY AS NEEDED FOR ANXIETY 09/25/15   [name]., MD  aspirin 81 MG tablet Take 81 mg by mouth at bedtime. Reported on 07/01/2015 05/27/11   Historical Provider, MD  carvedilol (COREG) 25 MG tablet Take 1 tablet (25 mg total) by mouth 2 (two) times daily with a meal. 08/21/15   [name]., MD  Cholecalciferol 1000 UNITS tablet Take 1,000 Units by mouth at bedtime.     Historical Provider, MD  FLUoxetine (PROZAC) 40 MG capsule TAKE ONE CAPSULE BY MOUTH EVERY DAY Patient taking differently: TAKE 40 MG BY MOUTH EVERY DAY 03/25/15   [name]., MD  loperamide (IMODIUM) 2 MG capsule Take 2 mg by mouth 2 (two) times daily as needed for diarrhea or loose stools.     Historical Provider, MD  losartan (COZAAR) 100 MG tablet TAKE ONE TABLET BY MOUTH EVERY DAY Patient taking differently: TAKE 100 MG BY MOUTH EVERY DAY 03/25/15   [name]., MD  meclizine (ANTIVERT) 25 MG tablet Take 25 mg by mouth daily as needed for dizziness.  06/18/13   Historical Provider, MD  metFORMIN (GLUCOPHAGE) 1000 MG tablet TAKE ONE TABLET BY MOUTH TWICE DAILY 09/25/15   [name]., MD  mometasone (ELOCON) 0.1 % cream Apply 1 application topically daily. Use daily as directed for up 2 weeks out of the month Patient taking differently: Apply 1 application topically daily as needed (burning).  09/26/15   Richard Maceo Pro., MD  naproxen (NAPROSYN) 500 MG tablet Take 1 tablet (500 mg total) by mouth 2 (two) times daily with a meal. Patient not taking: Reported on  12/08/2015 08/21/15   [name]., MD  omeprazole (PRILOSEC) 20 MG capsule Take 1 capsule (20 mg total) by mouth every morning. Patient not taking: Reported on 12/08/2015 10/15/15   [name]., MD  pioglitazone (ACTOS) 45 MG tablet TAKE ONE TABLET BY MOUTH EACH DAY. Patient taking differently: TAKE 45 MG BY MOUTH EACH DAY. 02/03/15   Richard Maceo Pro., MD  prochlorperazine (COMPAZINE) 10 MG tablet TAKE 1 TABLET EVERY 6 HOURS AS NEEDED FOR NAUSEA AND VOMITING 12/15/15   Curt Bears, MD  simvastatin (ZOCOR) 40 MG tablet TAKE ONE TABLET BY MOUTH EVERY DAY 08/11/15   Richard Maceo Pro., MD  VOTRIENT 200 MG tablet TAKE 4 TABLETS BY MOUTH DAILY. TAKE ON AN EMPTY STOMACH. Patient taking differently: TAKE 3 TABLETS BY MOUTH DAILY. TAKE ON AN EMPTY  STOMACH. 11/10/15   Curt Bears, MD    Family History Family History  Problem Relation Age of Onset  . Stroke Mother   . Cardiomyopathy Mother   . Hypertension Mother   . Hypertension Father   . Congestive Heart Failure Father   . Heart disease Brother   . Heart attack Brother   . Hypertension Brother   . Diabetes Brother   . Obesity Brother   . Heart disease Brother   . Sleep apnea Son     Social History Social History  Substance Use Topics  . Smoking status: Former Smoker    Packs/day: 1.50    Years: 35.00    Types: Cigarettes    Quit date: 12/24/2001  . Smokeless tobacco: Never Used  . Alcohol use No     Allergies   Amoxicillin; Codeine; Penicillins; and Ace inhibitors   Review of Systems Review of Systems ROS: Statement: All systems negative except as marked or noted in the HPI; Constitutional: Negative for fever and chills. +generalized weakness/fatigue. ; ; Eyes: Negative for eye pain, redness and discharge. ; ; ENMT: Negative for ear pain, hoarseness, nasal congestion, sinus pressure and sore throat. ; ; Cardiovascular: Negative for chest pain, palpitations, diaphoresis, dyspnea and peripheral edema.  ; ; Respiratory: Negative for cough, wheezing and stridor. ; ; Gastrointestinal: +nausea, poor PO intake. Negative for vomiting, diarrhea, abdominal pain, blood in stool, hematemesis, jaundice and rectal bleeding. . ; ; Genitourinary: Negative for dysuria, flank pain and hematuria. ; ; Musculoskeletal: Negative for back pain and neck pain. Negative for swelling and trauma.; ; Skin: Negative for pruritus, rash, abrasions, blisters, bruising and skin lesion.; ; Neuro: Negative for headache, lightheadedness and neck stiffness. Negative for altered level of consciousness, altered mental status, extremity weakness, paresthesias, involuntary movement, seizure and syncope.      Physical Exam Updated Vital Signs BP (!) 90/52   Pulse 72   Temp 97.8 F (36.6 C) (Oral)   Resp 18   Ht 5' 11.5" (1.816 m)   Wt 230 lb (104.3 kg)   SpO2 96%   BMI 31.63 kg/m    Patient Vitals for the past 24 hrs:  BP Temp Temp src Pulse Resp SpO2 Height Weight  12/19/15 2130 118/62 - - 60 19 98 % - -  12/19/15 2030 103/61 - - 70 19 95 % - -  12/19/15 2009 (!) 88/56 - - 69 18 97 % - -  12/19/15 1930 (!) 90/52 - - 72 18 96 % - -  12/19/15 1834 99/63 - - 70 16 100 % 5' 11.5" (1.816 m) 230 lb (104.3 kg)  12/19/15 1755 - - - - - - '5\' 11"'$  (1.803 m) 232 lb (105.2 kg)  12/19/15 1752 99/76 97.8 F (36.6 C) Oral 75 18 100 % - -      Physical Exam 1835: Physical examination:  Nursing notes reviewed; Vital signs and O2 SAT reviewed;  Constitutional: Well developed, Well nourished, Well hydrated, In no acute distress; Head:  Normocephalic, atraumatic; Eyes: EOMI, PERRL, No scleral icterus; ENMT: Mouth and pharynx normal, Mucous membranes moist; Neck: Supple, Full range of motion, No lymphadenopathy; Cardiovascular: Regular rate and rhythm, No gallop; Respiratory: Breath sounds clear & equal bilaterally, No wheezes.  Speaking full sentences with ease, Normal respiratory effort/excursion; Chest: Nontender, Movement normal; Abdomen:  Soft, Nontender, Nondistended, Normal bowel sounds; Genitourinary: No CVA tenderness; Extremities: Pulses normal, No tenderness, No edema, No calf edema or asymmetry.; Neuro: AA&Ox3, Major CN grossly intact. No  facial droop. Speech clear. No gross focal motor or sensory deficits in extremities.; Skin: Color pale, Warm, Dry.   ED Treatments / Results  Labs (all labs ordered are listed, but only abnormal results are displayed)   EKG  EKG Interpretation  Date/Time:  Friday December 19 2015 21:21:15 EDT Ventricular Rate:  65 PR Interval:    QRS Duration: 102 QT Interval:  455 QTC Calculation: 474 R Axis:   21 Text Interpretation:  Sinus rhythm Consider right atrial enlargement Low voltage, precordial leads When compared with ECG of 12/08/2015 QT has shortened Confirmed by [facility]  MD, Nunzio Cory [phone]) on 12/19/2015 9:27:48 PM       Radiology   Procedures Procedures (including critical care time)  Medications Ordered in ED Medications  0.9 %  sodium chloride infusion ( Intravenous New Bag/Given 12/19/15 1934)  ondansetron (ZOFRAN) injection 4 mg (not administered)  sodium chloride 0.9 % bolus 500 mL (500 mLs Intravenous New Bag/Given 12/19/15 1934)     Initial Impression / Assessment and Plan / ED Course  I have reviewed the triage vital signs and the nursing notes.  Pertinent labs & imaging results that were available during my care of the patient were reviewed by me and considered in my medical decision making (Patrick chart for details).  MDM Reviewed: previous chart, nursing note and vitals Reviewed previous: labs Interpretation: labs Total time providing critical care: 30-74 minutes. This excludes time spent performing separately reportable procedures and services. Consults: admitting MD   CRITICAL CARE Performed by: Alfonzo Feller Total critical care time: 35 minutes Critical care time was exclusive of separately billable procedures and treating other  patients. Critical care was necessary to treat or prevent imminent or life-threatening deterioration. Critical care was time spent personally by me on the following activities: development of treatment plan with patient and/or surrogate as well as nursing, discussions with consultants, evaluation of patient's response to treatment, examination of patient, obtaining history from patient or surrogate, ordering and performing treatments and interventions, ordering and review of laboratory studies, ordering and review of radiographic studies, pulse oximetry and re-evaluation of patient's condition.    Results for orders placed or performed during the hospital encounter of 12/19/15  CBC with Differential  Result Value Ref Range   WBC 6.5 4.0 - 10.5 K/uL   RBC 3.40 (L) 4.22 - 5.81 MIL/uL   Hemoglobin 10.7 (L) 13.0 - 17.0 g/dL   HCT 32.2 (L) 39.0 - 52.0 %   MCV 94.7 78.0 - 100.0 fL   MCH 31.5 26.0 - 34.0 pg   MCHC 33.2 30.0 - 36.0 g/dL   RDW 19.7 (H) 11.5 - 15.5 %   Platelets 123 (L) 150 - 400 K/uL   Neutrophils Relative % 54 %   Neutro Abs 3.5 1.7 - 7.7 K/uL   Lymphocytes Relative 30 %   Lymphs Abs 1.9 0.7 - 4.0 K/uL   Monocytes Relative 9 %   Monocytes Absolute 0.6 0.1 - 1.0 K/uL   Eosinophils Relative 7 %   Eosinophils Absolute 0.5 0.0 - 0.7 K/uL   Basophils Relative 0 %   Basophils Absolute 0.0 0.0 - 0.1 K/uL  Comprehensive metabolic panel  Result Value Ref Range   Sodium 138 135 - 145 mmol/L   Potassium 5.6 (H) 3.5 - 5.1 mmol/L   Chloride 111 101 - 111 mmol/L   CO2 19 (L) 22 - 32 mmol/L   Glucose, Bld 83 65 - 99 mg/dL   BUN 33 (H) 6 - 20 mg/dL  Creatinine, Ser 2.36 (H) 0.61 - 1.24 mg/dL   Calcium 5.4 (LL) 8.9 - 10.3 mg/dL   Total Protein 6.5 6.5 - 8.1 g/dL   Albumin 3.6 3.5 - 5.0 g/dL   AST 41 15 - 41 U/L   ALT 29 17 - 63 U/L   Alkaline Phosphatase 53 38 - 126 U/L   Total Bilirubin 0.9 0.3 - 1.2 mg/dL   GFR calc non Af Amer 26 (L) >60 mL/min   GFR calc Af Amer 30 (L) >60  mL/min   Anion gap 8 5 - 15    Results for [name], [name] (MRN [id number]) as of 12/19/2015 19:51  Ref. Range 12/08/2015 19:50 12/16/2015 08:28 12/19/2015 15:49 12/19/2015 18:47  BUN Latest Ref Range: 6 - 20 mg/dL 20 23.8 30.4 (H) 33 (H)  Creatinine Latest Ref Range: 0.61 - 1.24 mg/dL 1.74 (H) 1.7 (H) 2.3 (H) 2.36 (H)    Results for [name], Patrick SR. (MRN [id number]) as of 12/19/2015 19:51  Ref. Range 10/08/2015 13:33 11/11/2015 13:04 11/13/2015 13:59 12/08/2015 19:50 12/16/2015 08:28 12/19/2015 15:49 12/19/2015 18:47  Calcium Latest Ref Range: 8.9 - 10.3 mg/dL 8.9 7.1 (L) 6.9 (L) 6.2 (LL) 6.6 (L) 5.8 Repeated and ... (LL) 5.4 (LL)    2045:  EPIC chart reviewed: BP running lower than usual over the past 1 week (SBP 100's).  IVF boluses given for SBP 80-90's with slow improvement. Calcium repleted IV. Pt mentating per baseline, denies CP/SOB/abd pain.  Dx and testing d/w pt.  Questions answered.  Verb understanding, agreeable to admit. T/C to [facility]. Roel Cluck, case discussed, including:  HPI, pertinent PM/SHx, VS/PE, dx testing, ED course and treatment:  Agreeable to admit, requests she will come to the ED for evaluation.       Final Clinical Impressions(s) / ED Diagnoses   Final diagnoses:  None    New Prescriptions New Prescriptions   No medications on file     Francine Graven, DO 12/20/15 2358

## 2015-12-19 NOTE — Telephone Encounter (Signed)
CALL PATIENT TO CONF LAB APPT PER 08/27 [facility] MSG. L/M ON PATIENT HOME AND CELL PHONE. 12/19/15

## 2015-12-19 NOTE — Telephone Encounter (Signed)
BMET reviewed with Dr. Benay Spice as MD out of office.  Pt to be further evaluated in ED.    Called pt on mobile rings then disconnects, home # disconnects after numerous rings and wife's mobile # no voice mail set up. Unable to reach pt.Called daughter April, unable to reach received. Will continue to try and reach pt with instructions.  Spoke with pt, instructed to go to ED due to lab results. Pt verbalized understanding and will proceed to ED.  Message to MD for review.

## 2015-12-20 DIAGNOSIS — D638 Anemia in other chronic diseases classified elsewhere: Secondary | ICD-10-CM

## 2015-12-20 LAB — COMPREHENSIVE METABOLIC PANEL
ALBUMIN: 3.4 g/dL — AB (ref 3.5–5.0)
ALK PHOS: 50 U/L (ref 38–126)
ALT: 27 U/L (ref 17–63)
AST: 38 U/L (ref 15–41)
Anion gap: 8 (ref 5–15)
BILIRUBIN TOTAL: 0.9 mg/dL (ref 0.3–1.2)
BUN: 31 mg/dL — AB (ref 6–20)
CALCIUM: 5.8 mg/dL — AB (ref 8.9–10.3)
CO2: 21 mmol/L — AB (ref 22–32)
CREATININE: 1.96 mg/dL — AB (ref 0.61–1.24)
Chloride: 113 mmol/L — ABNORMAL HIGH (ref 101–111)
GFR calc Af Amer: 38 mL/min — ABNORMAL LOW (ref >60)
GFR calc non Af Amer: 33 mL/min — ABNORMAL LOW (ref >60)
GLUCOSE: 93 mg/dL (ref 65–99)
Potassium: 4.6 mmol/L (ref 3.5–5.1)
SODIUM: 142 mmol/L (ref 135–145)
Total Protein: 6.4 g/dL — ABNORMAL LOW (ref 6.5–8.1)

## 2015-12-20 LAB — GLUCOSE, CAPILLARY
GLUCOSE-CAPILLARY: 102 mg/dL — AB (ref 65–99)
GLUCOSE-CAPILLARY: 71 mg/dL (ref 65–99)
GLUCOSE-CAPILLARY: 91 mg/dL (ref 65–99)
Glucose-Capillary: 104 mg/dL — ABNORMAL HIGH (ref 65–99)
Glucose-Capillary: 59 mg/dL — ABNORMAL LOW (ref 65–99)
Glucose-Capillary: 74 mg/dL (ref 65–99)
Glucose-Capillary: 80 mg/dL (ref 65–99)

## 2015-12-20 LAB — TSH: TSH: 1.874 u[IU]/mL (ref 0.350–4.500)

## 2015-12-20 LAB — PHOSPHORUS: Phosphorus: 3.8 mg/dL (ref 2.5–4.6)

## 2015-12-20 LAB — CBC
HCT: 31.2 % — ABNORMAL LOW (ref 39.0–52.0)
Hemoglobin: 10.3 g/dL — ABNORMAL LOW (ref 13.0–17.0)
MCH: 31 pg (ref 26.0–34.0)
MCHC: 33 g/dL (ref 30.0–36.0)
MCV: 94 fL (ref 78.0–100.0)
PLATELETS: 120 10*3/uL — AB (ref 150–400)
RBC: 3.32 MIL/uL — ABNORMAL LOW (ref 4.22–5.81)
RDW: 19.6 % — AB (ref 11.5–15.5)
WBC: 6.9 10*3/uL (ref 4.0–10.5)

## 2015-12-20 LAB — NA AND K (SODIUM & POTASSIUM), RAND UR
Potassium Urine: 26 mmol/L
Sodium, Ur: 43 mmol/L

## 2015-12-20 LAB — CREATININE, URINE, RANDOM: CREATININE, URINE: 256.32 mg/dL

## 2015-12-20 LAB — MAGNESIUM: Magnesium: 0.6 mg/dL — CL (ref 1.7–2.4)

## 2015-12-20 MED ORDER — MAGNESIUM SULFATE 4 GM/100ML IV SOLN
4.0000 g | Freq: Once | INTRAVENOUS | Status: AC
  Administered 2015-12-20: 4 g via INTRAVENOUS
  Filled 2015-12-20: qty 100

## 2015-12-20 MED ORDER — SODIUM CHLORIDE 0.9 % IV SOLN
1.0000 g | Freq: Once | INTRAVENOUS | Status: AC
  Administered 2015-12-20: 1 g via INTRAVENOUS
  Filled 2015-12-20: qty 10

## 2015-12-20 MED ORDER — SODIUM CHLORIDE 0.9 % IV SOLN
INTRAVENOUS | Status: DC
  Administered 2015-12-20: 19:00:00 via INTRAVENOUS

## 2015-12-20 NOTE — Progress Notes (Signed)
Patient ID: Patrick Salt., male   DOB: [birth year], 70 y.o.   MRN: [id number]  PROGRESS NOTE    [name]  YJE:[id number] DOB: 1946-02-05 DOA: 12/19/2015  PCP: [name], MD   Brief Narrative:  70 y.o. 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 Pt presented to [facility] for evaluation of increased fatigue and poor oral intake for past few days prior to this admission.   On admission, he was hemodynamically stable. Blood work showed Calcium of 5.8, potassium 5.8, hemoglobin 10.7, Cr 2.3. He was given IV fluids and admitted for further evaluation.   Assessment & Plan:   Active Problems: Electrolyte imbalance / Hyperkalemia / Hypocalcemia / Metabolic acidosis - Likely dehydration, sequela of chemotherapy (pt on daily Votrient) - Continue to supplement calcium (has receive calcium gluoconate and calcium carbonate) - Calcium level still low at 5.8, corrected calcium 6.3 - Potassium WNL - Continue to monitor BMP daily   RCC with metastases to brain and mediastinum - On chemo with votrient, now on hold since admission  Diabetes mellitus without complications without long term insulin - On metformin at home, actos  - On SSI in hospital - CBG's in past 24 hours: 80, 104, 91  Dyslipidemia associated with type 2 DM - Continue statin therapy   Depression - Continue Prozac   Acute renal failure with superimposed CKD stage 3 - Cr 2.36 on admission, likely due to dehydration - Improving with fluids - Follow up BMP in am  Anemia of chronic disease / Thrombocytopenia - Due to malignancy - Hemoglobin 10.3 - Platelets 120 - Continue to follow CBC daily   DVT prophylaxis: Lovenox subQ Code Status: full code  Family Communication: no family at the bedside this  am Disposition Plan: home likely in next 2-3 days   Consultants:   None   Procedures:   None   Antimicrobials:   None    Subjective: No overnight events.  Objective: Vitals:   12/20/15 0500 12/20/15 0900 12/20/15 0919 12/20/15 1517  BP: 124/84 (!) 117/59 117/60 (!) 128/58  Pulse: (!) 58 61 (!) 56 60  Resp: '18 20  17  '$ Temp: 97.8 F (36.6 C) 97.6 F (36.4 C)    TempSrc: Oral Oral    SpO2: 99%   99%  Weight:      Height:        Intake/Output Summary (Last 24 hours) at 12/20/15 1654 Last data filed at 12/20/15 1618  Gross per 24 hour  Intake          1313.75 ml  Output               12 ml  Net          1301.75 ml   Filed Weights   12/19/15 1755 12/19/15 1834 12/19/15 2355  Weight: 105.2 kg (232 lb) 104.3 kg (230 lb) 104.7 kg (230 lb 13.2 oz)    Examination:  General exam: Appears calm and comfortable  Respiratory system: Clear to auscultation. Respiratory effort normal. Cardiovascular system: S1 & S2 heard, Rate controlled  Gastrointestinal system: Abdomen is nondistended, soft and nontender. No organomegaly or masses felt. Normal bowel sounds heard. Central nervous system: Alert and oriented. No focal neurological deficits. Extremities: Symmetric 5 x 5 power. Skin: No rashes,  lesions or ulcers Psychiatry: Judgement and insight appear normal. Mood & affect appropriate.   Data Reviewed: I have personally reviewed following labs and imaging studies  CBC:  Recent Labs Lab 12/16/15 0828 12/19/15 1847 12/20/15 0415  WBC 6.5 6.5 6.9  NEUTROABS 3.7 3.5  --   HGB 11.4* 10.7* 10.3*  HCT 34.1* 32.2* 31.2*  MCV 93.7 94.7 94.0  PLT 109* 123* 124*   Basic Metabolic Panel:  Recent Labs Lab 12/16/15 0828 12/19/15 1549 12/19/15 1847 12/20/15 0415  NA 140 140 138 142  K 5.7 No visable hemolysis* 5.8 No visable hemolysis* 5.6* 4.6  CL  --   --  111 113*  CO2 21* 19* 19* 21*  GLUCOSE 87 110 83 93  BUN 23.8 30.4* 33* 31*  CREATININE 1.7* 2.3* 2.36*  1.96*  CALCIUM 6.6* 5.8 Repeated and Verified* 5.4* 5.8*  MG  --   --   --  0.6*  PHOS  --   --   --  3.8   GFR: Estimated Creatinine Clearance: 43.2 mL/min (by C-G formula based on SCr of 1.96 mg/dL). Liver Function Tests:  Recent Labs Lab 12/16/15 0828 12/19/15 1847 12/20/15 0415  AST 50* 41 38  ALT 36 29 27  ALKPHOS 63 53 50  BILITOT 1.06 0.9 0.9  PROT 7.0 6.5 6.4*  ALBUMIN 3.5 3.6 3.4*   No results for input(s): LIPASE, AMYLASE in the last 168 hours. No results for input(s): AMMONIA in the last 168 hours. Coagulation Profile: No results for input(s): INR, PROTIME in the last 168 hours. Cardiac Enzymes: No results for input(s): CKTOTAL, CKMB, CKMBINDEX, TROPONINI in the last 168 hours. BNP (last 3 results) No results for input(s): PROBNP in the last 8760 hours. HbA1C: No results for input(s): HGBA1C in the last 72 hours. CBG:  Recent Labs Lab 12/20/15 0006 12/20/15 0046 12/20/15 0508 12/20/15 0834 12/20/15 1212  GLUCAP 59* 71 74 80 104*   Lipid Profile: No results for input(s): CHOL, HDL, LDLCALC, TRIG, CHOLHDL, LDLDIRECT in the last 72 hours. Thyroid Function Tests:  Recent Labs  12/20/15 0504  TSH 1.874   Anemia Panel: No results for input(s): VITAMINB12, FOLATE, FERRITIN, TIBC, IRON, RETICCTPCT in the last 72 hours. Urine analysis:    Component Value Date/Time   COLORURINE YELLOW 12/19/2015 2118   APPEARANCEUR CLEAR 12/19/2015 2118   LABSPEC 1.019 12/19/2015 2118   PHURINE 5.0 12/19/2015 2118   GLUCOSEU NEGATIVE 12/19/2015 2118   HGBUR NEGATIVE 12/19/2015 2118   BILIRUBINUR SMALL (A) 12/19/2015 2118   KETONESUR NEGATIVE 12/19/2015 2118   PROTEINUR NEGATIVE 12/19/2015 2118   UROBILINOGEN 1.0 05/08/2009 2305   NITRITE NEGATIVE 12/19/2015 2118   LEUKOCYTESUR SMALL (A) 12/19/2015 2118   Sepsis Labs: '@LABRCNTIP'$ (procalcitonin:4,lacticidven:4)   )No results found for this or any previous visit (from the past 240 hour(s)).    Radiology  Studies: No results found.   Scheduled Meds: . aspirin  81 mg Oral QHS  . calcium carbonate  1 tablet Oral BID WC  . cholecalciferol  1,000 Units Oral QHS  . enoxaparin (LOVENOX) injection  50 mg Subcutaneous Q24H  . FLUoxetine  40 mg Oral Daily  . insulin aspart  0-9 Units Subcutaneous Q4H  . simvastatin  40 mg Oral Daily  . sodium chloride flush  3 mL Intravenous Q12H   Continuous Infusions:    LOS: 1 day    Time spent: 25 minutes  Greater than 50% of the time spent on counseling and coordinating the care.  Leisa Lenz, MD [facility] Pager [phone]  If 7PM-7AM, please contact night-coverage [email or web address] Password [facility] 12/20/2015, 4:54 PM

## 2015-12-20 NOTE — Progress Notes (Signed)
Pt. placed on CPAP for h/s with M/L nasal mask, humidity filled, on room air, tolerating well.

## 2015-12-20 NOTE — Progress Notes (Signed)
Hypoglycemic Event  CBG: 59  Treatment: Orange juice  Symptoms: none  Follow-up CBG: Time:0046 CBG Result:71  Possible Reasons for Event: unknown, poor PO intake  Comments/MD notified:protocol followed    Harlene Ramus A

## 2015-12-21 DIAGNOSIS — N179 Acute kidney failure, unspecified: Secondary | ICD-10-CM

## 2015-12-21 LAB — GLUCOSE, CAPILLARY
GLUCOSE-CAPILLARY: 76 mg/dL (ref 65–99)
GLUCOSE-CAPILLARY: 83 mg/dL (ref 65–99)
GLUCOSE-CAPILLARY: 86 mg/dL (ref 65–99)
GLUCOSE-CAPILLARY: 96 mg/dL (ref 65–99)
GLUCOSE-CAPILLARY: 99 mg/dL (ref 65–99)
Glucose-Capillary: 72 mg/dL (ref 65–99)
Glucose-Capillary: 66 mg/dL (ref 65–99)

## 2015-12-21 LAB — BASIC METABOLIC PANEL
Anion gap: 6 (ref 5–15)
BUN: 21 mg/dL — AB (ref 6–20)
CALCIUM: 6.5 mg/dL — AB (ref 8.9–10.3)
CO2: 21 mmol/L — ABNORMAL LOW (ref 22–32)
CREATININE: 1.26 mg/dL — AB (ref 0.61–1.24)
Chloride: 116 mmol/L — ABNORMAL HIGH (ref 101–111)
GFR calc Af Amer: >60 mL/min (ref >60)
GFR, EST NON AFRICAN AMERICAN: 56 mL/min — AB (ref >60)
GLUCOSE: 76 mg/dL (ref 65–99)
Potassium: 5.3 mmol/L — ABNORMAL HIGH (ref 3.5–5.1)
SODIUM: 143 mmol/L (ref 135–145)

## 2015-12-21 LAB — MAGNESIUM: MAGNESIUM: 1.4 mg/dL — AB (ref 1.7–2.4)

## 2015-12-21 LAB — CALCIUM, IONIZED
CALCIUM, IONIZED, SERUM: 3.2 mg/dL — AB (ref 4.5–5.6)
Calcium, Ionized, Serum: 3.4 mg/dL — ABNORMAL LOW (ref 4.5–5.6)

## 2015-12-21 LAB — CBC
HCT: 30.8 % — ABNORMAL LOW (ref 39.0–52.0)
Hemoglobin: 10 g/dL — ABNORMAL LOW (ref 13.0–17.0)
MCH: 30.8 pg (ref 26.0–34.0)
MCHC: 32.5 g/dL (ref 30.0–36.0)
MCV: 94.8 fL (ref 78.0–100.0)
PLATELETS: 104 10*3/uL — AB (ref 150–400)
RBC: 3.25 MIL/uL — AB (ref 4.22–5.81)
RDW: 19.5 % — AB (ref 11.5–15.5)
WBC: 4.4 10*3/uL (ref 4.0–10.5)

## 2015-12-21 LAB — PARATHYROID HORMONE, INTACT (NO CA): PTH: 88 pg/mL — AB (ref 15–65)

## 2015-12-21 LAB — HEMOGLOBIN A1C
Hgb A1c MFr Bld: 6.2 % — ABNORMAL HIGH (ref 4.8–5.6)
MEAN PLASMA GLUCOSE: 131 mg/dL

## 2015-12-21 MED ORDER — GLUCERNA SHAKE PO LIQD
237.0000 mL | Freq: Four times a day (QID) | ORAL | Status: DC
  Administered 2015-12-21 – 2015-12-22 (×2): 237 mL via ORAL
  Filled 2015-12-21 (×7): qty 237

## 2015-12-21 MED ORDER — SODIUM CHLORIDE 0.9 % IV SOLN
1.0000 g | Freq: Once | INTRAVENOUS | Status: AC
  Administered 2015-12-21: 1 g via INTRAVENOUS
  Filled 2015-12-21: qty 10

## 2015-12-21 MED ORDER — MAGNESIUM SULFATE 4 GM/100ML IV SOLN
4.0000 g | Freq: Once | INTRAVENOUS | Status: AC
  Administered 2015-12-21: 4 g via INTRAVENOUS
  Filled 2015-12-21: qty 100

## 2015-12-21 NOTE — Plan of Care (Signed)
Problem: Bowel/Gastric: Goal: Will not experience complications related to bowel motility Outcome: Not Progressing Pt continues to have diarrhea. Has been receiving chemotherapy.

## 2015-12-21 NOTE — Progress Notes (Signed)
Patient ID: Patrick Salt., male   DOB: 01/25/1946, 70 y.o.   MRN: [id number]  PROGRESS NOTE    [name]  OLI:[id number] DOB: 1946-01-29 DOA: 12/19/2015  PCP: [name], MD   Brief Narrative:  70 y.o. 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 Pt presented to [facility] for evaluation of increased fatigue and poor oral intake for past few days prior to this admission.   On admission, he was hemodynamically stable. Blood work showed Calcium of 5.8, potassium 5.8, hemoglobin 10.7, Cr 2.3. He was given IV fluids and admitted for further evaluation.  Assessment & Plan:   Active Problems: Electrolyte imbalance / Hyperkalemia/ Hypomagnesemia / Hypocalcemia / Metabolic acidosis - Likely dehydration, sequela of chemotherapy (pt on daily Votrient) - Continue to supplement calcium (has receive calcium gluoconate and calcium carbonate) - Calcium level still low but improving slowly, will add one more dose of Calcium gluc today  - Potassium now slightly above target range but will not try to correct it yet, will rather repeat BMP to see if it stabilizes  - Mg is severely low, will give 4 gm IV today - Continue to monitor BMP daily, Mg in AM  RCC with metastases to brain and mediastinum - On chemo with votrient, now on hold since admission  Diabetes mellitus without complications without long term insulin - On metformin at home, actos  - On SSI in hospital - may be able to reduce dose of Metformin, will see how A1C looks - pt is concerned above hypoglycemic events with both antihyperglycemics and I think it is reasonable to lower the dose if A1C is at target range   Dyslipidemia associated with type 2 DM - Continue statin therapy   Depression - Continue Prozac    Acute renal failure with superimposed CKD stage 3 - Cr 2.36 on admission, likely due to dehydration - Improving with fluids - continue to monitor   Anemia of chronic disease / Thrombocytopenia - Due to malignancy - no signs of bleeding  - CBC in AM   Obesity  - Body mass index is 32.19 kg/m.  DVT prophylaxis: Lovenox subQ Code Status: full code  Family Communication: no family at the bedside this am Disposition Plan: home likely in next 2-3 days if electrolytes stable    Consultants:   None   Procedures:   None   Antimicrobials:   None    Subjective: No overnight events.  Objective: Vitals:   12/20/15 1517 12/20/15 2053 12/21/15 0209 12/21/15 0528  BP: (!) 128/58 137/69 (!) 144/86 (!) 141/78  Pulse: 60 62 60 (!) 59  Resp: '17 18 18 19  '$ Temp:  98.3 F (36.8 C) 98 F (36.7 C) 97.8 F (36.6 C)  TempSrc:  Oral Oral Oral  SpO2: 99% 98% 98% 99%  Weight:      Height:        Intake/Output Summary (Last 24 hours) at 12/21/15 1133 Last data filed at 12/21/15 0100  Gross per 24 hour  Intake           1182.5 ml  Output              279 ml  Net            903.5 ml  Filed Weights   12/19/15 1755 12/19/15 1834 12/19/15 2355  Weight: 105.2 kg (232 lb) 104.3 kg (230 lb) 104.7 kg (230 lb 13.2 oz)    Examination:  General exam: Appears calm and comfortable  Respiratory system: Clear to auscultation. Respiratory effort normal. Cardiovascular system: S1 & S2 heard, Rate controlled  Gastrointestinal system: Abdomen is nondistended, soft and nontender. No organomegaly or masses felt.   Data Reviewed: I have personally reviewed following labs and imaging studies  CBC:  Recent Labs Lab 12/16/15 0828 12/19/15 1847 12/20/15 0415 12/21/15 0514  WBC 6.5 6.5 6.9 4.4  NEUTROABS 3.7 3.5  --   --   HGB 11.4* 10.7* 10.3* 10.0*  HCT 34.1* 32.2* 31.2* 30.8*  MCV 93.7 94.7 94.0 94.8  PLT 109* 123* 120* 528*   Basic Metabolic Panel:  Recent Labs Lab  12/16/15 0828 12/19/15 1549 12/19/15 1847 12/20/15 0415 12/21/15 0514  NA 140 140 138 142 143  K 5.7 No visable hemolysis* 5.8 No visable hemolysis* 5.6* 4.6 5.3*  CL  --   --  111 113* 116*  CO2 21* 19* 19* 21* 21*  GLUCOSE 87 110 83 93 76  BUN 23.8 30.4* 33* 31* 21*  CREATININE 1.7* 2.3* 2.36* 1.96* 1.26*  CALCIUM 6.6* 5.8 Repeated and Verified* 5.4* 5.8* 6.5*  MG  --   --   --  0.6*  --   PHOS  --   --   --  3.8  --    Liver Function Tests:  Recent Labs Lab 12/16/15 0828 12/19/15 1847 12/20/15 0415  AST 50* 41 38  ALT 36 29 27  ALKPHOS 63 53 50  BILITOT 1.06 0.9 0.9  PROT 7.0 6.5 6.4*  ALBUMIN 3.5 3.6 3.4*   CBG:  Recent Labs Lab 12/21/15 0050 12/21/15 0402 12/21/15 0746 12/21/15 0845 12/21/15 1124  GLUCAP 76 72 66 99 83   Thyroid Function Tests:  Recent Labs  12/20/15 0504  TSH 1.874   Urine analysis:    Component Value Date/Time   COLORURINE YELLOW 12/19/2015 2118   APPEARANCEUR CLEAR 12/19/2015 2118   LABSPEC 1.019 12/19/2015 2118   PHURINE 5.0 12/19/2015 2118   GLUCOSEU NEGATIVE 12/19/2015 2118   HGBUR NEGATIVE 12/19/2015 2118   BILIRUBINUR SMALL (A) 12/19/2015 2118   KETONESUR NEGATIVE 12/19/2015 2118   PROTEINUR NEGATIVE 12/19/2015 2118   UROBILINOGEN 1.0 05/08/2009 2305   NITRITE NEGATIVE 12/19/2015 2118   LEUKOCYTESUR SMALL (A) 12/19/2015 2118    Radiology Studies: No results found.   Scheduled Meds: . aspirin  81 mg Oral QHS  . calcium carbonate  1 tablet Oral BID WC  . calcium gluconate  1 g Intravenous Once  . cholecalciferol  1,000 Units Oral QHS  . enoxaparin (LOVENOX) injection  50 mg Subcutaneous Q24H  . FLUoxetine  40 mg Oral Daily  . insulin aspart  0-9 Units Subcutaneous Q4H  . simvastatin  40 mg Oral Daily  . sodium chloride flush  3 mL Intravenous Q12H   Continuous Infusions:    LOS: 2 days    Time spent: 25 minutes  Greater than 50% of the time spent on counseling and coordinating the  care.   Faye Ramsay, MD [facility] Pager [phone]  If 7PM-7AM, please contact night-coverage [email or web address] Password [facility] 12/21/2015, 11:33 AM

## 2015-12-21 NOTE — Progress Notes (Signed)
Initial Nutrition Assessment  DOCUMENTATION CODES:   Obesity unspecified  INTERVENTION:   Provide Glucerna Shake po QID, each supplement provides 220 kcal and 10 grams of protein Encourage PO intake RD to continue to monitor  NUTRITION DIAGNOSIS:   Inadequate oral intake related to poor appetite as evidenced by per patient/family report.  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 Pt presented to [facility] for evaluation of increased fatigue and poor oral intake for past few days prior to this admission.   Patient in room with family at bedside. Pt reports eating better since admission (95-100% meal completion) but PTA he was not eating at all. He tries to drink Powerade, Glucerna shakes and Boost Glucose control supplements at home. Pt has taste alterations.  Per weight history, pt has lost 78 lb since 1 year ago (25% wt loss x 1 year, significant for time frame).  Nutrition focused physical exam shows no sign of depletion of muscle mass or body fat.  Medications: OS-CAL tablet BID, Vitamin D tablet daily, IV Mg sulfate, IV Zofran PRN Labs reviewed: Elevated K Low Mg  Diet Order:  Diet Carb Modified Fluid consistency: Thin; Room service appropriate? Yes  Skin:  Reviewed, no issues  Last BM:  8/27  Height:   Ht Readings from Last 1 Encounters:  12/19/15 '5\' 11"'$  (1.803 m)    Weight:   Wt Readings from Last 1 Encounters:  12/19/15 230 lb 13.2 oz (104.7 kg)    Ideal Body Weight:  78.2 kg  BMI:  Body mass index  is 32.19 kg/m.  Estimated Nutritional Needs:   Kcal:  1031-5945  Protein:  115-125g  Fluid:  2.5L/day  EDUCATION NEEDS:   No education needs identified at this time  Clayton Bibles, MS, RD, LDN Pager: [phone] After Hours Pager: [phone]

## 2015-12-22 ENCOUNTER — Inpatient Hospital Stay (HOSPITAL_COMMUNITY): Payer: Medicare Other

## 2015-12-22 ENCOUNTER — Telehealth: Payer: Self-pay | Admitting: Family Medicine

## 2015-12-22 DIAGNOSIS — C649 Malignant neoplasm of unspecified kidney, except renal pelvis: Secondary | ICD-10-CM

## 2015-12-22 LAB — BASIC METABOLIC PANEL
Anion gap: 5 (ref 5–15)
BUN: 16 mg/dL (ref 6–20)
CALCIUM: 7.2 mg/dL — AB (ref 8.9–10.3)
CO2: 22 mmol/L (ref 22–32)
Chloride: 113 mmol/L — ABNORMAL HIGH (ref 101–111)
Creatinine, Ser: 1.13 mg/dL (ref 0.61–1.24)
GFR calc Af Amer: >60 mL/min (ref >60)
GLUCOSE: 87 mg/dL (ref 65–99)
Potassium: 5.1 mmol/L (ref 3.5–5.1)
Sodium: 140 mmol/L (ref 135–145)

## 2015-12-22 LAB — CBC
HCT: 30.4 % — ABNORMAL LOW (ref 39.0–52.0)
Hemoglobin: 10.2 g/dL — ABNORMAL LOW (ref 13.0–17.0)
MCH: 32 pg (ref 26.0–34.0)
MCHC: 33.6 g/dL (ref 30.0–36.0)
MCV: 95.3 fL (ref 78.0–100.0)
PLATELETS: 113 10*3/uL — AB (ref 150–400)
RBC: 3.19 MIL/uL — ABNORMAL LOW (ref 4.22–5.81)
RDW: 19.3 % — AB (ref 11.5–15.5)
WBC: 4.8 10*3/uL (ref 4.0–10.5)

## 2015-12-22 LAB — MAGNESIUM: Magnesium: 1.8 mg/dL (ref 1.7–2.4)

## 2015-12-22 LAB — GLUCOSE, CAPILLARY
GLUCOSE-CAPILLARY: 107 mg/dL — AB (ref 65–99)
GLUCOSE-CAPILLARY: 75 mg/dL (ref 65–99)
Glucose-Capillary: 81 mg/dL (ref 65–99)
Glucose-Capillary: 128 mg/dL — ABNORMAL HIGH (ref 65–99)
Glucose-Capillary: 69 mg/dL (ref 65–99)
Glucose-Capillary: 66 mg/dL (ref 65–99)
Glucose-Capillary: 68 mg/dL (ref 65–99)

## 2015-12-22 LAB — CALCIUM, IONIZED: CALCIUM, IONIZED, SERUM: 3.5 mg/dL — AB (ref 4.5–5.6)

## 2015-12-22 MED ORDER — AMLODIPINE BESYLATE 10 MG PO TABS: 10.0000 mg | ORAL_TABLET | Freq: Every day | ORAL | 1 refills | Status: DC

## 2015-12-22 MED ORDER — GLUCOSE 40 % PO GEL
ORAL | Status: AC
  Administered 2015-12-22: 14:00:00
  Filled 2015-12-22: qty 1

## 2015-12-22 MED ORDER — CALCIUM CARBONATE 1250 (500 CA) MG PO TABS: 1.0000 | ORAL_TABLET | Freq: Two times a day (BID) | ORAL | Status: DC

## 2015-12-22 MED ORDER — DEXTROSE 50 % IV SOLN
INTRAVENOUS | Status: AC
  Administered 2015-12-22: 50 mL
  Filled 2015-12-22: qty 50

## 2015-12-22 MED ORDER — SODIUM CHLORIDE 0.9 % IV SOLN
1.0000 g | Freq: Once | INTRAVENOUS | Status: AC
  Administered 2015-12-22: 1 g via INTRAVENOUS
  Filled 2015-12-22: qty 10

## 2015-12-22 MED ORDER — IOPAMIDOL (ISOVUE-300) INJECTION 61%
30.0000 mL | Freq: Once | INTRAVENOUS | Status: AC | PRN
  Administered 2015-12-22: 30 mL via ORAL

## 2015-12-22 NOTE — Discharge Summary (Signed)
Physician Discharge Summary  Patrick MAYA Sr. [name]:[id number] DOB: [birth year] DOA: 12/19/2015  PCP: [name], MD  Admit date: 12/19/2015 Discharge date: 12/22/2015  Recommendations for Outpatient Follow-up:  1. Pt will need to follow up with PCP in 2-3 weeks post discharge 2. Please obtain BMP to evaluate electrolytes and kidney function, Mg and K level  3. Please also check CBC to evaluate Hg and Hct levels 4. Please note that due to hyperkalemia, losartan has been held, if K level stable on follow up, losartan can be resumed as needed  5. Please also note that Coreg was held due to resting bradycardia with HR in 40's. This was discussed with cardiologist on call and recommendation was to stop Coreg with close outpatient follow up 6. In addition, due to acute kidney injury with multiple electrolyte derangements, hypocalcemia, hypomagnesemia, hyperkalemia, losartan, metformin stopped 7. Metformin was also stopped due to significant hypoglycemia with CBG's in 40;s and rather difficult to correct with snacks as needed and D50 shots, A1C 6.2 on this admission  8. Pt has an appointment with Dr [name] and cardiologist scheduled  9. Naproxen and Omeprazole removed from pt's list as pt reported he no longer takes those medications   Discharge Diagnoses:  Active Problems:   Diabetes mellitus, type 2 ([name])   Essential (primary) hypertension   Adenocarcinoma, renal cell (HCC)   Obstructive apnea   Chronic obstructive pulmonary disease (HCC)   Type 2 diabetes mellitus (HCC)   Brain metastasis (HCC)   Dehydration   Hyperkalemia   Hypokalemia   Diarrhea   Acute renal failure (ARF) ([name])    Discharge Condition: Stable  Diet recommendation: Heart healthy diet discussed in details   History of present illness:  Brief Narrative:  70 y.o.malewith past medical history significant for 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 Pt presented to [facility] for evaluation of increased fatigue and poor oral intake for past few days prior to this admission.   On admission, he was hemodynamically stable. Blood work showed Calcium of 5.8, potassium 5.8, hemoglobin 10.7, Cr 2.3. He was given IV fluids and admitted for further evaluation.  Assessment & Plan:   Active Problems: Electrolyte imbalance / Hyperkalemia/ Hypomagnesemia / Hypocalcemia / Metabolic acidosis - Likely dehydration, sequela of chemotherapy (pt on daily Votrient) - required multiple doses of Ca gluconate to increase the Ca level  - Calcium level still low but improving slowly - K was elevated but stabilized with holding Losartan  - Mg was supplemented but needs outpatient follow up   [place] with metastases to brain and mediastinum - On chemo with votrient, follow up with Dr. [name]   Diabetes mellitus without complications without long term insulin - On metformin at home, actos  - A1C 6.2, pt reluctant to take antihyperglycemics due to persistent hypoglycemia  - this was discussed with pt at length, will hold his hole regime with plan on close follow up with PCP   Dyslipidemia associated with type 2 DM - outpatient follow up   Depression - Continue Prozac   Acute renal failure with superimposed CKD stage 3 - Cr 2.36 on admission, likely due to dehydration - Improved with fluids   Anemia of chronic disease / Thrombocytopenia - Due to malignancy - no signs of bleeding    Obesity  - Body mass  index is 32.19 kg/m.  DVT prophylaxis: Lovenox subQ Code Status: full code  Family Communication: no family at the bedside this am Disposition Plan: home    Consultants:   None   Procedures:   None   Antimicrobials:   None    Procedures/Studies: Dg Chest 2 View  Result Date: 12/22/2015 CLINICAL DATA:  Renal cell  carcinoma with metastases. Nonproductive cough. EXAM: CHEST  2 VIEW COMPARISON:  09/22/2015 FINDINGS: Postoperative changes from prior CABG. Mild cardiomegaly. No confluent airspace opacities or effusions. No acute bony abnormality. IMPRESSION: No active disease. Electronically Signed   By: [name] M.D.   On: 12/22/2015 11:18   Ct Head Wo Contrast  Result Date: 12/08/2015 CLINICAL DATA:  Patient woke with nausea. Earlier today the patient experienced LEFT arm numbness which resolved. EXAM: CT HEAD WITHOUT CONTRAST TECHNIQUE: Contiguous axial images were obtained from the base of the skull through the vertex without intravenous contrast. COMPARISON:  MR brain most recent 11/03/2015. FINDINGS: No evidence for acute infarction, hemorrhage, mass lesion, hydrocephalus, or extra-axial fluid. Mild cerebral and cerebellar atrophy. Chronic microvascular ischemic change. Vascular calcification. Calvarium intact. No sinus or mastoid disease. IMPRESSION: Stable exam.  No acute intracranial findings. Electronically Signed   By: [name] M.D.   On: 12/08/2015 21:16    Discharge Exam: Vitals:   12/21/15 2201 12/22/15 0609  BP:  (!) 153/69  Pulse: (!) 54 (!) 59  Resp: 16 18  Temp:  97.5 F (36.4 C)   Vitals:   12/21/15 1502 12/21/15 2120 12/21/15 2201 12/22/15 0609  BP: 139/83 (!) 156/72  (!) 153/69  Pulse: (!) 53 60 (!) 54 (!) 59  Resp: '20 18 16 18  '$ Temp: 98.1 F (36.7 C) 97.6 F (36.4 C)  97.5 F (36.4 C)  TempSrc: Oral Oral  Oral  SpO2: 99% 100% 96% 97%  Weight:      Height:        General: Pt is alert, follows commands appropriately, not in acute distress Cardiovascular: Regular rhythm, bradycardia, no murmurs, no rubs, no gallops Respiratory: Clear to auscultation bilaterally, no wheezing, no crackles, no rhonchi Abdominal: Soft, non tender, non distended, bowel sounds +, no guarding  Discharge Instructions     Medication List    STOP taking these medications   carvedilol 25  MG tablet Commonly known as:  COREG   losartan 100 MG tablet Commonly known as:  COZAAR   metFORMIN 1000 MG tablet Commonly known as:  GLUCOPHAGE   naproxen 500 MG tablet Commonly known as:  NAPROSYN   omeprazole 20 MG capsule Commonly known as:  PRILOSEC   pioglitazone 45 MG tablet Commonly known as:  ACTOS     TAKE these medications   ADVAIR DISKUS 100-50 MCG/DOSE Aepb Generic drug:  Fluticasone-Salmeterol INHALE 1 PUFF TWICE A DAY What changed:  See the new instructions.   albuterol 108 (90 Base) MCG/ACT inhaler Commonly known as:  PROVENTIL HFA;VENTOLIN HFA Inhale 1 puff into the lungs every 6 (six) hours as needed for wheezing.   ALPRAZolam 0.5 MG tablet Commonly known as:  XANAX TAKE ONE TABLET BY MOUTH TWICE DAILY AS NEEDED What changed:  See the new instructions.   amLODipine 10 MG tablet Commonly known as:  NORVASC Take 1 tablet (10 mg total) by mouth daily.   aspirin 81 MG tablet Take 81 mg by mouth at bedtime. Reported on 07/01/2015   calcium carbonate 1250 (500 Ca) MG tablet Commonly known as:  OS-CAL - dosed in  mg of elemental calcium Take 1 tablet (500 mg of elemental calcium total) by mouth 2 (two) times daily with a meal.   Cholecalciferol 1000 units tablet Take 1,000 Units by mouth at bedtime.   FLUoxetine 40 MG capsule Commonly known as:  PROZAC TAKE ONE CAPSULE BY MOUTH EVERY DAY What changed:  See the new instructions.   loperamide 2 MG capsule Commonly known as:  IMODIUM Take 2 mg by mouth 2 (two) times daily as needed for diarrhea or loose stools.   meclizine 25 MG tablet Commonly known as:  ANTIVERT Take 25 mg by mouth daily as needed for dizziness.   mometasone 0.1 % cream Commonly known as:  ELOCON Apply 1 application topically daily. Use daily as directed for up 2 weeks out of the month What changed:  when to take this  reasons to take this  additional instructions   prochlorperazine 10 MG tablet Commonly known as:   COMPAZINE TAKE 1 TABLET EVERY 6 HOURS AS NEEDED FOR NAUSEA AND VOMITING   simvastatin 40 MG tablet Commonly known as:  ZOCOR TAKE ONE TABLET BY MOUTH EVERY DAY   VOTRIENT 200 MG tablet Generic drug:  pazopanib TAKE 4 TABLETS BY MOUTH DAILY. TAKE ON AN EMPTY STOMACH. What changed:  See the new instructions.       Follow-up Information    [name], MD .   Specialty:  Family Medicine Contact information: [address] [place] [address] [phone]        Faye Ramsay, MD .   Specialty:  Internal Medicine Why:  call my cell phone with any questions [phone] Contact information: [address] [place] [address] [phone]            The results of significant diagnostics from this hospitalization (including imaging, microbiology, ancillary and laboratory) are listed below for reference.     Microbiology: No results found for this or any previous visit (from the past 240 hour(s)).   Labs: Basic Metabolic Panel:  Recent Labs Lab 12/19/15 1549 12/19/15 1847 12/20/15 0415 12/21/15 0514 12/21/15 1200 12/22/15 0411  NA 140 138 142 143  --  140  K 5.8 No visable hemolysis* 5.6* 4.6 5.3*  --  5.1  CL  --  111 113* 116*  --  113*  CO2 19* 19* 21* 21*  --  22  GLUCOSE 110 83 93 76  --  87  BUN 30.4* 33* 31* 21*  --  16  CREATININE 2.3* 2.36* 1.96* 1.26*  --  1.13  CALCIUM 5.8 Repeated and Verified* 5.4* 5.8* 6.5*  --  7.2*  MG  --   --  0.6*  --  1.4* 1.8  PHOS  --   --  3.8  --   --   --    Liver Function Tests:  Recent Labs Lab 12/16/15 0828 12/19/15 1847 12/20/15 0415  AST 50* 41 38  ALT 36 29 27  ALKPHOS 63 53 50  BILITOT 1.06 0.9 0.9  PROT 7.0 6.5 6.4*  ALBUMIN 3.5 3.6 3.4*   No results for input(s): LIPASE, AMYLASE in the last 168 hours. No results for input(s): AMMONIA in the last 168 hours. CBC:  Recent Labs Lab 12/16/15 0828 12/19/15 1847 12/20/15 0415 12/21/15 0514  12/22/15 0411  WBC 6.5 6.5 6.9 4.4 4.8  NEUTROABS 3.7 3.5  --   --   --   HGB 11.4* 10.7* 10.3* 10.0* 10.2*  HCT 34.1* 32.2* 31.2*  30.8* 30.4*  MCV 93.7 94.7 94.0 94.8 95.3  PLT 109* 123* 120* 104* 113*   CBG:  Recent Labs Lab 12/21/15 1624 12/21/15 1956 12/22/15 0001 12/22/15 0350 12/22/15 0848  GLUCAP 86 96 75 81 128*     SIGNED: Time coordinating discharge:  30 minutes  MAGICK-Ruhi Kopke, MD  [facility] 12/22/2015, 11:38 AM Pager [phone]  If 7PM-7AM, please contact night-coverage [email or web address] Password [email or web address]

## 2015-12-22 NOTE — Telephone Encounter (Signed)
Dr Doyle Askew with [facility] called stating pt is being discharged today for dehydration.  I have scheduled a hospital follow up appointment/MW

## 2015-12-22 NOTE — Discharge Instructions (Addendum)
Please stop taking Losartan and Coreg until you are seen by your primary care doctor.   Coreg was held as your heart rate was in 50's.  Your losartan was held due to potassium level 5.6 (this was normal on discharge but you have not gotten this medication during your hospital stay).  Your metformin and pioglitazone were held due hypoglycemic levels (low sugar levels) and due to acute kidney injury Creatinine level 2.36 (Creatinine on discharge is within normal limits 1.13), your A1C in 6.2. Continue your excellent diet and exercise, weight loss regimen.   Your appointment with Dr. Rosanna Randy is on September 5th, 2017 at 3:15 pm.   Your appointment with Dr. [name] is on August 31st, 2017 at 11:45 am.  Acute Kidney Injury Acute kidney injury is any condition in which there is sudden (acute) damage to the kidneys. Acute kidney injury was previously known as acute kidney failure or acute renal failure. The kidneys are two organs that lie on either side of the spine between the middle of the back and the front of the abdomen. The kidneys: Remove wastes and extra water from the blood.  Produce important hormones. These help keep bones strong, regulate blood pressure, and help create red blood cells.  Balance the fluids and chemicals in the blood and tissues. A small amount of kidney damage may not cause problems, but a large amount of damage may make it difficult or impossible for the kidneys to work the way they should. Acute kidney injury may develop into long-lasting (chronic) kidney disease. It may also develop into a life-threatening disease called end-stage kidney disease. Acute kidney injury can get worse very quickly, so it should be treated right away. Early treatment may prevent other kidney diseases from developing. CAUSES  A problem with blood flow to the kidneys. This may be caused by:  Blood loss.  Heart disease.  Severe burns.  Liver disease. Direct damage to the kidneys. This  may be caused by: Some medicines.  A kidney infection.  Poisoning or consuming toxic substances.  A surgical wound.  A blow to the kidney area.  A problem with urine flow. This may be caused by:  Cancer.  Kidney stones.  An enlarged prostate. SIGNS AND SYMPTOMS  Swelling (edema) of the legs, ankles, or feet.  Tiredness (lethargy).  Nausea or vomiting.  Confusion.  Problems with urination, such as:  Painful or burning feeling during urination.  Decreased urine production.  Frequent accidents in children who are potty trained.  Bloody urine.  Muscle twitches and cramps.  Shortness of breath.  Seizures.  Chest pain or pressure. Sometimes, no symptoms are present. DIAGNOSIS Acute kidney injury may be detected and diagnosed by tests, including blood, urine, imaging, or kidney biopsy tests.  TREATMENT Treatment of acute kidney injury varies depending on the cause and severity of the kidney damage. In mild cases, no treatment may be needed. The kidneys may heal on their own. If acute kidney injury is more severe, your health care provider will treat the cause of the kidney damage, help the kidneys heal, and prevent complications from occurring. Severe cases may require a procedure to remove toxic wastes from the body (dialysis) or surgery to repair kidney damage. Surgery may involve:  Repair of a torn kidney.  Removal of an obstruction. HOME CARE INSTRUCTIONS Follow your prescribed diet. Take medicines only as directed by your health care provider. Do not take any new medicines (prescription, over-the-counter, or nutritional supplements) unless approved by your  health care provider. Many medicines can worsen your kidney damage or may need to have the dose adjusted.  Keep all follow-up visits as directed by your health care provider. This is important. Observe your condition to make sure you are healing as expected. SEEK IMMEDIATE MEDICAL CARE IF: You are feeling  ill or have severe pain in the back or side.  Your symptoms return or you have new symptoms. You have any symptoms of end-stage kidney disease. These include:  Persistent itchiness.  Loss of appetite.  Headaches.  Abnormally dark or light skin. Numbness in the hands or feet.  Easy bruising.  Frequent hiccups.  Menstruation stops.  You have a fever. You have increased urine production. You have pain or bleeding when urinating. MAKE SURE YOU:  Understand these instructions. Will watch your condition. Will get help right away if you are not doing well or get worse.   This information is not intended to replace advice given to you by your health care provider. Make sure you discuss any questions you have with your health care provider.   Document Released: 10/26/2010 Document Revised: 05/03/2014 Document Reviewed: 12/10/2011 Elsevier Interactive Patient Education [employer].

## 2015-12-22 NOTE — Telephone Encounter (Signed)
[id number]

## 2015-12-22 NOTE — Progress Notes (Signed)
Patient CBG 69 at 1219.  Patient drank orange juice mixed with contrast (patient scheduled to have CT ABD this afternoon).  CBG was 66 at 1307.  Patient was given oral glucose gel.  Patient's CBG was 68 at 1352.  Patient was administered 25 ml of D50 solution via IV.  Follow up CBG was 107 at 1413.  Patient stated he felt he had a slight headache and felt slightly nauseous.  Feels fine at this time.  He ate breakfast this morning but did refuse his morning glucerna.  Is currently NPO for the CT ABD/Pelvis.  Dr. Doyle Askew paged.  Will continue to monitor patient.

## 2015-12-23 ENCOUNTER — Ambulatory Visit (HOSPITAL_COMMUNITY): Admission: RE | Admit: 2015-12-23 | Payer: Medicare Other | Source: Ambulatory Visit

## 2015-12-23 ENCOUNTER — Other Ambulatory Visit: Payer: Self-pay | Admitting: Family Medicine

## 2015-12-23 DIAGNOSIS — E1122 Type 2 diabetes mellitus with diabetic chronic kidney disease: Secondary | ICD-10-CM

## 2015-12-23 DIAGNOSIS — N183 Chronic kidney disease, stage 3 (moderate): Principal | ICD-10-CM

## 2015-12-23 LAB — CALCIUM, IONIZED: Calcium, Ionized, Serum: 3.5 mg/dL — ABNORMAL LOW (ref 4.5–5.6)

## 2015-12-25 ENCOUNTER — Encounter: Payer: Self-pay | Admitting: Internal Medicine

## 2015-12-25 ENCOUNTER — Telehealth: Payer: Self-pay | Admitting: Internal Medicine

## 2015-12-25 ENCOUNTER — Ambulatory Visit (HOSPITAL_BASED_OUTPATIENT_CLINIC_OR_DEPARTMENT_OTHER): Payer: Medicare Other | Admitting: Internal Medicine

## 2015-12-25 ENCOUNTER — Other Ambulatory Visit (HOSPITAL_BASED_OUTPATIENT_CLINIC_OR_DEPARTMENT_OTHER): Payer: Medicare Other

## 2015-12-25 VITALS — BP 137/83 | HR 83 | Temp 97.6°F | Resp 17 | Ht 71.0 in | Wt 234.2 lb

## 2015-12-25 DIAGNOSIS — R197 Diarrhea, unspecified: Secondary | ICD-10-CM

## 2015-12-25 DIAGNOSIS — E875 Hyperkalemia: Secondary | ICD-10-CM

## 2015-12-25 DIAGNOSIS — C642 Malignant neoplasm of left kidney, except renal pelvis: Secondary | ICD-10-CM

## 2015-12-25 DIAGNOSIS — Z85528 Personal history of other malignant neoplasm of kidney: Secondary | ICD-10-CM

## 2015-12-25 DIAGNOSIS — Z5111 Encounter for antineoplastic chemotherapy: Secondary | ICD-10-CM

## 2015-12-25 DIAGNOSIS — E876 Hypokalemia: Secondary | ICD-10-CM | POA: Diagnosis not present

## 2015-12-25 DIAGNOSIS — C7931 Secondary malignant neoplasm of brain: Secondary | ICD-10-CM | POA: Diagnosis not present

## 2015-12-25 DIAGNOSIS — E860 Dehydration: Secondary | ICD-10-CM

## 2015-12-25 DIAGNOSIS — R5383 Other fatigue: Secondary | ICD-10-CM | POA: Diagnosis not present

## 2015-12-25 LAB — CBC WITH DIFFERENTIAL/PLATELET
BASO%: 0.5 % (ref 0.0–2.0)
Basophils Absolute: 0 10*3/uL (ref 0.0–0.1)
EOS%: 7.3 % — ABNORMAL HIGH (ref 0.0–7.0)
Eosinophils Absolute: 0.4 10*3/uL (ref 0.0–0.5)
HEMATOCRIT: 31.5 % — AB (ref 38.4–49.9)
HEMOGLOBIN: 10.3 g/dL — AB (ref 13.0–17.1)
LYMPH#: 1.3 10*3/uL (ref 0.9–3.3)
LYMPH%: 20.3 % (ref 14.0–49.0)
MCH: 31.8 pg (ref 27.2–33.4)
MCHC: 32.8 g/dL (ref 32.0–36.0)
MCV: 96.8 fL (ref 79.3–98.0)
MONO#: 0.6 10*3/uL (ref 0.1–0.9)
MONO%: 10.1 % (ref 0.0–14.0)
NEUT%: 61.8 % (ref 39.0–75.0)
NEUTROS ABS: 3.8 10*3/uL (ref 1.5–6.5)
PLATELETS: 150 10*3/uL (ref 140–400)
RBC: 3.26 10*6/uL — ABNORMAL LOW (ref 4.20–5.82)
RDW: 18.8 % — AB (ref 11.0–14.6)
WBC: 6.2 10*3/uL (ref 4.0–10.3)

## 2015-12-25 LAB — COMPREHENSIVE METABOLIC PANEL
ALBUMIN: 3.2 g/dL — AB (ref 3.5–5.0)
ALT: 27 U/L (ref 0–55)
ANION GAP: 7 meq/L (ref 3–11)
AST: 39 U/L — ABNORMAL HIGH (ref 5–34)
Alkaline Phosphatase: 56 U/L (ref 40–150)
BILIRUBIN TOTAL: 1.09 mg/dL (ref 0.20–1.20)
BUN: 15 mg/dL (ref 7.0–26.0)
CALCIUM: 9.4 mg/dL (ref 8.4–10.4)
CO2: 23 mEq/L (ref 22–29)
CREATININE: 1.1 mg/dL (ref 0.7–1.3)
Chloride: 109 mEq/L (ref 98–109)
EGFR: 67 mL/min/{1.73_m2} — ABNORMAL LOW (ref >90)
Glucose: 104 mg/dl (ref 70–140)
Potassium: 5.5 mEq/L — ABNORMAL HIGH (ref 3.5–5.1)
Sodium: 138 mEq/L (ref 136–145)
TOTAL PROTEIN: 6.3 g/dL — AB (ref 6.4–8.3)

## 2015-12-25 LAB — CALCIUM, IONIZED: CALCIUM, IONIZED, SERUM: 3.1 mg/dL — AB (ref 4.5–5.6)

## 2015-12-25 LAB — VITAMIN D 25 HYDROXY (VIT D DEFICIENCY, FRACTURES): Vit D, 25-Hydroxy: 33.4 ng/mL (ref 30.0–100.0)

## 2015-12-25 LAB — LACTATE DEHYDROGENASE: LDH: 250 U/L — AB (ref 125–245)

## 2015-12-25 MED ORDER — SODIUM POLYSTYRENE SULFONATE 15 GM/60ML PO SUSP
30.0000 g | Freq: Once | ORAL | Status: DC
  Filled 2015-12-25: qty 120

## 2015-12-25 NOTE — Progress Notes (Signed)
[facility] [phone]   Fax:([phone]) [place], MD 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 This was initially diagnosed as a stage III status post left nephrectomy as well as left adrenalectomy on 03/06/2012. 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  PRIOR THERAPY: Stereotactic radiotherapy to the solitary brain lesion scheduled for 08/20/2015  CURRENT THERAPY: Votrient 800 mg by mouth daily started 08/08/2015. His dose will be reduced to Votrient 600 mg by mouth daily starting 10/09/2015. Treatment is currently on hold.  INTERVAL HISTORY: Patrick Breed Sr. 70 y.o. male returns to the clinic today for follow-up visit. The patient has been on Votrient 600 mg by mouth daily since 10/09/2015 and was tolerating it well except except for fatigue and few be swords of diarrhea. He was recently admitted to [facility] for persistent hyperkalemia. He received definitive 5 dehydration during his hospitalization. He had CT scan of the abdomen and pelvis performed during his hospitalization that showed no evidence for disease progression in the abdomen or pelvis. He was supposed to have repeat CT scan of the chest for evaluation of his disease in the chest but unfortunately this was not performed. He is also complaining of increasing fatigue and weakness. He has no chest pain, shortness of breath, cough or hemoptysis. He has no fever or chills. He came today for evaluation and recommendation regarding his condition.  MEDICAL HISTORY: Past Medical History:  Diagnosis Date  . AAA (abdominal aortic aneurysm) ([place])   . Anxiety   . Asthma   . Cancer [facility])    Renal Cell- 2013; Lung 2017  . Chronic kidney disease    Renal Cell Adenocarcinoma  . Chronic kidney disease    Left Nephrectomy  . COPD (chronic obstructive pulmonary disease) ([place])   . Coronary artery disease   . Dehydration 12/16/2015  . Depression   . Diabetes mellitus without complication ([place])    Type II  . Eczema   . Encounter for antineoplastic chemotherapy 08/19/2015  . GERD (gastroesophageal reflux disease)   . Headache   . Heart murmur   . HPTH (hyperparathyroidism) ([place])   . Hx of radiation therapy 08/20/15   Right Temporal brain  . Hyperlipidemia   . Hyperparathyroidism ([place])   . Hypertension   . Hypertension 08/19/2015  . Hypothyroidism   . Malignant neoplasm of left kidney ([place]) 02/09/2013  . Neuropathy ([place])   . Shortness of breath dyspnea   . Sleep apnea    CPAP  . Thoracic aortic aneurysm ([place]) 2017    ALLERGIES:  is allergic to amoxicillin; codeine; penicillins; and ace inhibitors.  MEDICATIONS:  Current Outpatient Prescriptions  Medication Sig Dispense Refill  . ADVAIR DISKUS 100-50 MCG/DOSE AEPB INHALE 1 PUFF TWICE A DAY (Patient taking differently: INHALE 1 PUFF BY MOUTH TWICE A DAY AS NEEDED FOR SOB) 60 each 12  . albuterol (PROVENTIL HFA;VENTOLIN HFA) 108 (90 BASE) MCG/ACT inhaler Inhale 1 puff into the lungs every 6 (six) hours as needed for wheezing.     Marland Kitchen ALPRAZolam (XANAX) 0.5 MG tablet TAKE ONE TABLET BY MOUTH TWICE DAILY AS NEEDED (Patient taking differently: TAKE ONE TABLET BY MOUTH TWICE DAILY AS NEEDED FOR  ANXIETY) 60 tablet 5  . amLODipine (NORVASC) 10 MG tablet Take 1 tablet (10 mg total) by mouth daily. 30 tablet 1  . aspirin 81 MG tablet Take 81 mg by mouth at bedtime. Reported on 07/01/2015    . calcium carbonate (OS-CAL - DOSED IN MG OF ELEMENTAL CALCIUM) 1250 (500 Ca) MG tablet Take 1 tablet (500 mg of elemental calcium total) by mouth 2 (two) times daily with a meal.    . Cholecalciferol 1000 UNITS tablet Take 1,000 Units by mouth at  bedtime.     Marland Kitchen FLUoxetine (PROZAC) 40 MG capsule TAKE ONE CAPSULE BY MOUTH EVERY DAY (Patient taking differently: TAKE 40 MG BY MOUTH EVERY DAY) 30 capsule 12  . loperamide (IMODIUM) 2 MG capsule Take 2 mg by mouth 2 (two) times daily as needed for diarrhea or loose stools.     . meclizine (ANTIVERT) 25 MG tablet Take 25 mg by mouth daily as needed for dizziness.     . mometasone (ELOCON) 0.1 % cream Apply 1 application topically daily. Use daily as directed for up 2 weeks out of the month (Patient taking differently: Apply 1 application topically daily as needed (burning). ) 45 g 2  . ONE TOUCH ULTRA TEST test strip TEST TWICE DAILY 100 each 1  . ONETOUCH DELICA LANCETS 65K MISC TEST TWICE DAILY 100 each 5  . prochlorperazine (COMPAZINE) 10 MG tablet TAKE 1 TABLET EVERY 6 HOURS AS NEEDED FOR NAUSEA AND VOMITING 30 tablet 0  . simvastatin (ZOCOR) 40 MG tablet TAKE ONE TABLET BY MOUTH EVERY DAY 30 tablet 12  . VOTRIENT 200 MG tablet TAKE 4 TABLETS BY MOUTH DAILY. TAKE ON AN EMPTY STOMACH. (Patient taking differently: TAKE 3 TABLETS BY MOUTH DAILY. TAKE ON AN EMPTY STOMACH.) 120 tablet 2   No current facility-administered medications for this visit.     SURGICAL HISTORY:  Past Surgical History:  Procedure Laterality Date  . BACK SURGERY     compressed vertebrae-put in a plate to seprate the spaces  . CARDIAC CATHETERIZATION    . CERVICAL FUSION    . CORONARY ARTERY BYPASS GRAFT     x 5  . ENDOBRONCHIAL ULTRASOUND N/A 06/30/2015   Procedure: ENDOHIAL ULTRASOUND;  Surgeon: Flora Lipps, MD;  Location: [facility] ORS;  Service: Cardiopulmonary;  Laterality: N/A;  . FRACTURE SURGERY     left arm repair  . KNEE SURGERY     left knee repair  . PARATHYROIDECTOMY  12/17/09  . VIDEO BRONCHOSCOPY WITH ENDOBRONCHIAL ULTRASOUND N/A 07/09/2015   Procedure: VIDEO BRONCHOSCOPY WITH ENDOBRONCHIAL ULTRASOUND;  Surgeon: Ivin Poot, MD;  Location: [facility] OR;  Service: Thoracic;  Laterality: N/A;    REVIEW OF  SYSTEMS:  Constitutional: positive for fatigue Eyes: negative Ears, nose, mouth, throat, and face: negative Respiratory: negative Cardiovascular: negative Gastrointestinal: positive for diarrhea Genitourinary:negative Integument/breast: negative Hematologic/lymphatic: negative Musculoskeletal:positive for muscle weakness Neurological: negative Behavioral/Psych: negative Endocrine: negative Allergic/Immunologic: negative   PHYSICAL EXAMINATION: General appearance: alert, cooperative, fatigued and no distress Head: Normocephalic, without obvious abnormality, atraumatic Neck: no adenopathy, no JVD, supple, symmetrical, trachea midline and thyroid not enlarged, symmetric, no tenderness/mass/nodules Lymph nodes: Cervical, supraclavicular, and axillary nodes normal. Resp: clear to auscultation bilaterally Back: symmetric, no curvature. ROM normal. No CVA tenderness. Cardio: regular rate and rhythm, S1, S2 normal, no murmur, click, rub or gallop GI: soft, non-tender; bowel sounds normal; no masses,  no organomegaly Extremities: extremities normal, atraumatic, no cyanosis or edema Neurologic: Alert and oriented X 3, normal strength  and tone. Normal symmetric reflexes. Normal coordination and gait  ECOG PERFORMANCE STATUS: 1 - Symptomatic but completely ambulatory  Blood pressure 137/83, pulse 83, temperature 97.6 F (36.4 C), temperature source Oral, resp. rate 17, height '5\' 11"'$  (1.803 m), weight 234 lb 3.2 oz (106.2 kg), SpO2 99 %.  LABORATORY DATA: Lab Results  Component Value Date   WBC 6.2 12/25/2015   HGB 10.3 (L) 12/25/2015   HCT 31.5 (L) 12/25/2015   MCV 96.8 12/25/2015   PLT 150 12/25/2015      Chemistry      Component Value Date/Time   NA 138 12/25/2015 1132   K 5.5 no hemolysis noted (H) 12/25/2015 1132   CL 113 (H) 12/22/2015 0411   CL 103 01/19/2012 0834   CO2 23 12/25/2015 1132   BUN 15.0 12/25/2015 1132   CREATININE 1.1 12/25/2015 1132   GLU 159 11/21/2013       Component Value Date/Time   CALCIUM 9.4 12/25/2015 1132   ALKPHOS 56 12/25/2015 1132   AST 39 (H) 12/25/2015 1132   ALT 27 12/25/2015 1132   BILITOT 1.09 12/25/2015 1132       RADIOGRAPHIC STUDIES: Ct Abdomen Pelvis Wo Contrast  Result Date: 12/22/2015 CLINICAL DATA:  70 year old male with history of renal cell carcinoma with known metastatic disease to the lung, brain and mediastinum, currently undergoing oral chemotherapy after nephrectomy. EXAM: CT ABDOMEN AND PELVIS WITHOUT CONTRAST TECHNIQUE: Multidetector CT imaging of the abdomen and pelvis was performed following the standard protocol without IV contrast. COMPARISON:  PET-CT 06/24/2015. FINDINGS: Lower chest: Tiny calcified granuloma in the right lower lobe. No other suspicious pulmonary nodules in the visualize lung bases. Atherosclerotic calcifications in the right coronary artery. Mild cardiomegaly. Postoperative changes of median sternotomy for CABG. Hepatobiliary: No definite cystic or solid hepatic lesions are identified on today's noncontrast CT examination. Unenhanced appearance of the gallbladder is normal. Pancreas: No definite pancreatic mass or peripancreatic inflammatory changes on today's noncontrast CT examination. Spleen: Capsular calcifications associated with the lateral aspect of the spleen are unchanged, presumably related to prior trauma or prior surgery. Otherwise, unremarkable. Adrenals/Urinary Tract: Postoperative changes of left radical nephrectomy with some architectural distortion in the upper left retroperitoneal fat, presumably some chronic postoperative scarring. No definite soft tissue mass in the nephrectomy bed to suggest local recurrence of disease. Unenhanced appearance of the right kidney and bilateral adrenal glands is unremarkable. No right-sided hydroureteronephrosis. Unenhanced appearance of the urinary bladder is normal. Stomach/Bowel: Unenhanced appearance of the stomach is normal. No pathologic  dilatation of small bowel or colon. A few scattered colonic diverticulae are noted, without surrounding inflammatory changes to suggest an acute diverticulitis at this time. Normal appendix. Vascular/Lymphatic: Aortic atherosclerosis, in addition to calcified atherosclerotic plaque throughout many other abdominal and pelvic arteries, without definite aneurysm. No lymphadenopathy noted in the abdomen or pelvis. Reproductive: Prostate gland and seminal vesicles are unremarkable in appearance. Other: No significant volume of ascites.  No pneumoperitoneum. Musculoskeletal: There are no aggressive appearing lytic or blastic lesions noted in the visualized portions of the skeleton. IMPRESSION: 1. Status post left radical nephrectomy, without evidence to suggest local recurrence of disease or definite metastatic disease in the abdomen or pelvis on today's noncontrast CT examination. 2. Mild colonic diverticulosis without evidence of acute diverticulitis at this time. 3. Aortic atherosclerosis, in addition to at least right coronary artery disease. Status post median sternotomy for CABG. Electronically Signed   By: Vinnie Langton M.D.   On: 12/22/2015 18:03  Dg Chest 2 View  Result Date: 12/22/2015 CLINICAL DATA:  Renal cell carcinoma with metastases. Nonproductive cough. EXAM: CHEST  2 VIEW COMPARISON:  09/22/2015 FINDINGS: Postoperative changes from prior CABG. Mild cardiomegaly. No confluent airspace opacities or effusions. No acute bony abnormality. IMPRESSION: No active disease. Electronically Signed   By: [name] M.D.   On: 12/22/2015 11:18   Ct Head Wo Contrast  Result Date: 12/08/2015 CLINICAL DATA:  Patient woke with nausea. Earlier today the patient experienced LEFT arm numbness which resolved. EXAM: CT HEAD WITHOUT CONTRAST TECHNIQUE: Contiguous axial images were obtained from the base of the skull through the vertex without intravenous contrast. COMPARISON:  MR brain most recent 11/03/2015.  FINDINGS: No evidence for acute infarction, hemorrhage, mass lesion, hydrocephalus, or extra-axial fluid. Mild cerebral and cerebellar atrophy. Chronic microvascular ischemic change. Vascular calcification. Calvarium intact. No sinus or mastoid disease. IMPRESSION: Stable exam.  No acute intracranial findings. Electronically Signed   By: [name] M.D.   On: 12/08/2015 21:16    ASSESSMENT AND PLAN: This is a very pleasant 70 years old white male with metastatic renal cell carcinoma currently on treatment with Votrient 800 mg by mouth daily status post 2 months of treatment and he has been tolerating his treatment well except for persistent fatigue. His dose was a change it to Votrient 600 mg by mouth daily since 10/09/2015 and he was tolerating it much better but recently started having more fatigue and diarrhea.  I recommended for the patient to hold his treatment with Votrient for now until I see him back for follow-up visit in around 10 days with repeat CT scan of the chest without contrast for restaging of his disease. For the hypokalemia, I will start the patient on Kayexalate 30 g by mouth 1. The patient was advised to call immediately if he has any concerning symptoms in the interval. The patient voices understanding of current disease status and treatment options and is in agreement with the current care plan.  All questions were answered. The patient knows to call the clinic with any problems, questions or concerns. We can certainly see the patient much sooner if necessary.  Disclaimer: This note was dictated with voice recognition software. Similar sounding words can inadvertently be transcribed and may not be corrected upon review.

## 2015-12-25 NOTE — Telephone Encounter (Signed)
Gave patient avs report and appointments for September. Central radiology will call re scan - patient aware.

## 2015-12-26 ENCOUNTER — Other Ambulatory Visit: Payer: Self-pay

## 2015-12-26 MED ORDER — SODIUM POLYSTYRENE SULFONATE 15 GM/60ML PO SUSP: 30.0000 g | Freq: Once | ORAL | 0 refills | Status: AC

## 2015-12-26 MED ORDER — SODIUM POLYSTYRENE SULFONATE 15 GM/60ML PO SUSP: 30.0000 g | Freq: Once | ORAL | 0 refills | Status: DC

## 2015-12-26 NOTE — Telephone Encounter (Signed)
Pt called stating Dr [name] planned to rx something for his high potassium. Kayexalate plan seen in OV note. Medication ordered VO, cosign required. Ordered 500 ml by mistake and called pharmacy to decrease dispensed amnt to 120 ml.

## 2015-12-30 ENCOUNTER — Ambulatory Visit (INDEPENDENT_AMBULATORY_CARE_PROVIDER_SITE_OTHER): Payer: Medicare Other | Admitting: Family Medicine

## 2015-12-30 ENCOUNTER — Encounter: Payer: Self-pay | Admitting: Family Medicine

## 2015-12-30 VITALS — BP 118/70 | HR 88 | Temp 98.5°F | Resp 20 | Wt 234.0 lb

## 2015-12-30 DIAGNOSIS — E860 Dehydration: Secondary | ICD-10-CM | POA: Diagnosis not present

## 2015-12-30 DIAGNOSIS — E875 Hyperkalemia: Secondary | ICD-10-CM

## 2015-12-30 NOTE — Progress Notes (Signed)
Patient: [name]. Male    DOB: [birth year]   70 y.o.   MRN: [id number] Visit Date: 12/30/2015  Today's Provider: [name], MD   Chief Complaint  Patient presents with  . Hospitalization Follow-up  . Dehydration   Subjective:    HPI  Follow up Hospitalization  Patient was admitted to [facility] in [place] on 12/19/2015 and discharged on 12/22/2015. He was treated for dehyrdation. Treatment for this included discontinuing Losartan and Metformin. Patient has also discontinued Coreg due to low heart rate.  He reports good compliance with treatment. He reports this condition is Improved. It was also recommended that he have BMP and CBC repeated on today's visit. Patient also mentions that he has discontinued his "cancer medication" for the last week.     Patient also reports that his balance has worsened since he has been discharged from the hospital about 1 week ago. Patient reports that he fell at church 2 days ago, but he denies any injuries. Patient reports that he felt he lost his balance. No syncope.    Allergies  Allergen Reactions  . Amoxicillin Rash and Other (See Comments)    Other reaction(s): ANAPHYLAXIS  . Codeine Rash and Other (See Comments)    Other reaction(s): ANAPHYLAXIS  . Penicillins Rash and Other (See Comments)    Other reaction(s): ANAPHYLAXIS Has patient had a PCN reaction causing immediate rash,    . Ace Inhibitors Cough     Current Outpatient Prescriptions:  .  ADVAIR DISKUS 100-50 MCG/DOSE AEPB, INHALE 1 PUFF TWICE A DAY (Patient taking differently: INHALE 1 PUFF BY MOUTH TWICE A DAY AS NEEDED FOR SOB), Disp: 60 each, Rfl: 12 .  albuterol (PROVENTIL HFA;VENTOLIN HFA) 108 (90 BASE) MCG/ACT inhaler, Inhale 1 puff into the lungs every 6 (six) hours as needed for wheezing. , Disp: , Rfl:  .  ALPRAZolam (XANAX) 0.5 MG tablet, TAKE ONE TABLET BY MOUTH TWICE DAILY AS NEEDED (Patient taking differently: TAKE ONE TABLET BY MOUTH  TWICE DAILY AS NEEDED FOR ANXIETY), Disp: 60 tablet, Rfl: 5 .  amLODipine (NORVASC) 10 MG tablet, Take 1 tablet (10 mg total) by mouth daily., Disp: 30 tablet, Rfl: 1 .  aspirin 81 MG tablet, Take 81 mg by mouth at bedtime. Reported on 07/01/2015, Disp: , Rfl:  .  calcium carbonate (OS-CAL - DOSED IN MG OF ELEMENTAL CALCIUM) 1250 (500 Ca) MG tablet, Take 1 tablet (500 mg of elemental calcium total) by mouth 2 (two) times daily with a meal., Disp: , Rfl:  .  Cholecalciferol 1000 UNITS tablet, Take 1,000 Units by mouth at bedtime. , Disp: , Rfl:  .  FLUoxetine (PROZAC) 40 MG capsule, TAKE ONE CAPSULE BY MOUTH EVERY DAY (Patient taking differently: TAKE 40 MG BY MOUTH EVERY DAY), Disp: 30 capsule, Rfl: 12 .  ONE TOUCH ULTRA TEST test strip, TEST TWICE DAILY, Disp: 100 each, Rfl: 1 .  ONETOUCH DELICA LANCETS 59Y MISC, TEST TWICE DAILY, Disp: 100 each, Rfl: 5 .  simvastatin (ZOCOR) 40 MG tablet, TAKE ONE TABLET BY MOUTH EVERY DAY, Disp: 30 tablet, Rfl: 12 .  loperamide (IMODIUM) 2 MG capsule, Take 2 mg by mouth 2 (two) times daily as needed for diarrhea or loose stools. , Disp: , Rfl:  .  meclizine (ANTIVERT) 25 MG tablet, Take 25 mg by mouth daily as needed for dizziness. , Disp: , Rfl:  .  mometasone (ELOCON) 0.1 % cream, Apply 1 application topically daily.  Use daily as directed for up 2 weeks out of the month (Patient taking differently: Apply 1 application topically daily as needed (burning). ), Disp: 45 g, Rfl: 2 .  VOTRIENT 200 MG tablet, TAKE 4 TABLETS BY MOUTH DAILY. TAKE ON AN EMPTY STOMACH. (Patient not taking: Reported on 12/30/2015), Disp: 120 tablet, Rfl: 2  Review of Systems  Constitutional: Positive for activity change and fatigue. Negative for appetite change, chills, diaphoresis, fever and unexpected weight change.  Eyes: Negative.   Respiratory: Negative for apnea, cough, choking, chest tightness, shortness of breath, wheezing and stridor.   Cardiovascular: Negative for chest pain and leg  swelling.  Endocrine: Negative.   Allergic/Immunologic: Negative.   Neurological: Positive for dizziness and weakness. Negative for tremors, seizures, syncope, facial asymmetry, speech difficulty, light-headedness, numbness and headaches.  Psychiatric/Behavioral: Negative.     Social History  Substance Use Topics  . Smoking status: Former Smoker    Packs/day: 1.50    Years: 35.00    Types: Cigarettes    Quit date: 12/24/2001  . Smokeless tobacco: Never Used  . Alcohol use No   Objective:   BP 118/70 (BP Location: Right Arm, Patient Position: Sitting, Cuff Size: Normal)   Pulse 88   Temp 98.5 F (36.9 C)   Resp 20   Wt 234 lb (106.1 kg)   BMI 32.64 kg/m   Physical Exam  Constitutional: He is oriented to person, place, and time. He appears well-developed and well-nourished.  HENT:  Head: Normocephalic and atraumatic.  Eyes: Conjunctivae and EOM are normal. Pupils are equal, round, and reactive to light.  Neck: Normal range of motion. Neck supple.  Cardiovascular: Normal rate, regular rhythm and normal heart sounds.   Pulmonary/Chest: Effort normal and breath sounds normal.  Abdominal: Soft.  Neurological: He is alert and oriented to person, place, and time.  Hand grips equal bilaterally.   Skin: Skin is warm and dry.  Psychiatric: He has a normal mood and affect. His behavior is normal. Judgment and thought content normal.        Assessment & Plan:     1. Hyperkalemia Follow-up CBC. Avoid medications going forward that could contribute to hyperkalemia - Comprehensive metabolic panel  2. Dehydration Clinically resolved. - CBC with Differential/Platelet 3. Metastatic renal cell carcinoma to brain and lungs Metastatic disease to brain. I think this is the reason for his unsteadiness. He has had no syncope recently. He has follow-up in radiation oncology. 4. Type 2 diabetes 5. CAD 6. Weight loss Patient was morbidly obese with a weight of about 330 pounds a year  ago. He has had unintentional weight loss of about 60 pounds was medical conditions. Followed by oncology.I have done the exam and reviewed the above chart and it is accurate to the best of my knowledge.     I have done the exam and reviewed the above chart and it is accurate to the best of my knowledge.   Richard Cranford Mon, MD  [facility]

## 2015-12-30 NOTE — Patient Instructions (Signed)
Discontinue Simvastatin to see if it helps improve weakness.

## 2015-12-31 LAB — CBC WITH DIFFERENTIAL/PLATELET
BASOS ABS: 0 10*3/uL (ref 0.0–0.2)
Basos: 0 %
EOS (ABSOLUTE): 0.5 10*3/uL — ABNORMAL HIGH (ref 0.0–0.4)
Eos: 7 %
HEMOGLOBIN: 9.8 g/dL — AB (ref 12.6–17.7)
Hematocrit: 29.7 % — ABNORMAL LOW (ref 37.5–51.0)
Immature Grans (Abs): 0 10*3/uL (ref 0.0–0.1)
Immature Granulocytes: 0 %
LYMPHS ABS: 1.6 10*3/uL (ref 0.7–3.1)
Lymphs: 23 %
MCH: 32 pg (ref 26.6–33.0)
MCHC: 33 g/dL (ref 31.5–35.7)
MCV: 97 fL (ref 79–97)
MONOCYTES: 12 %
Monocytes Absolute: 0.8 10*3/uL (ref 0.1–0.9)
NEUTROS ABS: 4 10*3/uL (ref 1.4–7.0)
Neutrophils: 58 %
Platelets: 192 10*3/uL (ref 150–379)
RBC: 3.06 x10E6/uL — ABNORMAL LOW (ref 4.14–5.80)
RDW: 17.1 % — ABNORMAL HIGH (ref 12.3–15.4)
WBC: 6.8 10*3/uL (ref 3.4–10.8)

## 2015-12-31 LAB — COMPREHENSIVE METABOLIC PANEL
A/G RATIO: 1.6 (ref 1.2–2.2)
ALBUMIN: 3.8 g/dL (ref 3.5–4.8)
ALK PHOS: 73 IU/L (ref 39–117)
ALT: 26 IU/L (ref 0–44)
AST: 35 IU/L (ref 0–40)
BILIRUBIN TOTAL: 0.9 mg/dL (ref 0.0–1.2)
BUN / CREAT RATIO: 13 (ref 10–24)
BUN: 18 mg/dL (ref 8–27)
CHLORIDE: 101 mmol/L (ref 96–106)
CO2: 25 mmol/L (ref 18–29)
Calcium: 9.6 mg/dL (ref 8.6–10.2)
Creatinine, Ser: 1.39 mg/dL — ABNORMAL HIGH (ref 0.76–1.27)
GFR calc Af Amer: 59 mL/min/{1.73_m2} — ABNORMAL LOW (ref >59)
GFR calc non Af Amer: 51 mL/min/{1.73_m2} — ABNORMAL LOW (ref >59)
GLOBULIN, TOTAL: 2.4 g/dL (ref 1.5–4.5)
GLUCOSE: 135 mg/dL — AB (ref 65–99)
POTASSIUM: 4.6 mmol/L (ref 3.5–5.2)
SODIUM: 143 mmol/L (ref 134–144)
Total Protein: 6.2 g/dL (ref 6.0–8.5)

## 2016-01-01 ENCOUNTER — Telehealth: Payer: Self-pay | Admitting: Medical Oncology

## 2016-01-01 ENCOUNTER — Telehealth: Payer: Self-pay

## 2016-01-01 NOTE — Telephone Encounter (Signed)
  LMTCB 01/01/2016  Thanks,   -Mickel Baas

## 2016-01-01 NOTE — Telephone Encounter (Signed)
I returned pt call , he has information on labs -no answer.

## 2016-01-01 NOTE — Telephone Encounter (Signed)
Advised patient as below. Patient reports that he will call his oncologist to see if they will draw CBC at his appt next week. If not, I advised patient that we will leave a lab slip for him to pick up to have CBC rechecked.

## 2016-01-01 NOTE — Telephone Encounter (Signed)
-----   Message from [name]., MD sent at 12/31/2015 11:22 AM EDT ----- Mildly anemic. Otherwise labs stable. Would repeat CBC in 2 weeks or follow-up with oncology

## 2016-01-02 ENCOUNTER — Encounter (HOSPITAL_COMMUNITY): Payer: Self-pay

## 2016-01-02 ENCOUNTER — Ambulatory Visit (HOSPITAL_COMMUNITY)
Admission: RE | Admit: 2016-01-02 | Discharge: 2016-01-02 | Disposition: A | Discharge status: Home / Self Care | Payer: Medicare Other | Source: Ambulatory Visit | Attending: Internal Medicine | Admitting: Internal Medicine

## 2016-01-02 ENCOUNTER — Telehealth: Payer: Self-pay | Admitting: Medical Oncology

## 2016-01-02 DIAGNOSIS — J949 Pleural condition, unspecified: Secondary | ICD-10-CM | POA: Insufficient documentation

## 2016-01-02 DIAGNOSIS — R590 Localized enlarged lymph nodes: Secondary | ICD-10-CM | POA: Diagnosis not present

## 2016-01-02 DIAGNOSIS — C642 Malignant neoplasm of left kidney, except renal pelvis: Secondary | ICD-10-CM | POA: Diagnosis not present

## 2016-01-02 DIAGNOSIS — E875 Hyperkalemia: Secondary | ICD-10-CM

## 2016-01-02 DIAGNOSIS — R918 Other nonspecific abnormal finding of lung field: Secondary | ICD-10-CM | POA: Insufficient documentation

## 2016-01-02 DIAGNOSIS — Z5111 Encounter for antineoplastic chemotherapy: Secondary | ICD-10-CM

## 2016-01-02 NOTE — Telephone Encounter (Signed)
returned call. He did not take the kayexalate 8 days ago because he had to be at meetings and did not want to risk having diarrhea. His potassium 3 days ago was within normal range and I told him not to take the kayexalate at all.

## 2016-01-06 ENCOUNTER — Ambulatory Visit (HOSPITAL_BASED_OUTPATIENT_CLINIC_OR_DEPARTMENT_OTHER): Payer: Medicare Other | Admitting: Internal Medicine

## 2016-01-06 ENCOUNTER — Telehealth: Payer: Self-pay | Admitting: Internal Medicine

## 2016-01-06 ENCOUNTER — Other Ambulatory Visit (HOSPITAL_BASED_OUTPATIENT_CLINIC_OR_DEPARTMENT_OTHER): Payer: Medicare Other

## 2016-01-06 ENCOUNTER — Encounter: Payer: Self-pay | Admitting: Internal Medicine

## 2016-01-06 VITALS — BP 131/74 | HR 82 | Temp 98.0°F | Resp 17 | Ht 71.0 in | Wt 240.7 lb

## 2016-01-06 DIAGNOSIS — C7801 Secondary malignant neoplasm of right lung: Secondary | ICD-10-CM | POA: Diagnosis not present

## 2016-01-06 DIAGNOSIS — C7931 Secondary malignant neoplasm of brain: Secondary | ICD-10-CM | POA: Diagnosis not present

## 2016-01-06 DIAGNOSIS — R5383 Other fatigue: Secondary | ICD-10-CM

## 2016-01-06 DIAGNOSIS — C642 Malignant neoplasm of left kidney, except renal pelvis: Secondary | ICD-10-CM | POA: Diagnosis not present

## 2016-01-06 DIAGNOSIS — C781 Secondary malignant neoplasm of mediastinum: Secondary | ICD-10-CM

## 2016-01-06 DIAGNOSIS — R197 Diarrhea, unspecified: Secondary | ICD-10-CM

## 2016-01-06 DIAGNOSIS — D649 Anemia, unspecified: Secondary | ICD-10-CM

## 2016-01-06 DIAGNOSIS — Z5112 Encounter for antineoplastic immunotherapy: Secondary | ICD-10-CM

## 2016-01-06 HISTORY — DX: Encounter for antineoplastic immunotherapy: Z51.12

## 2016-01-06 LAB — COMPREHENSIVE METABOLIC PANEL
ALBUMIN: 3 g/dL — AB (ref 3.5–5.0)
ALK PHOS: 87 U/L (ref 40–150)
ALT: 30 U/L (ref 0–55)
AST: 37 U/L — AB (ref 5–34)
Anion Gap: 9 mEq/L (ref 3–11)
BILIRUBIN TOTAL: 0.57 mg/dL (ref 0.20–1.20)
BUN: 21.2 mg/dL (ref 7.0–26.0)
CALCIUM: 9.2 mg/dL (ref 8.4–10.4)
CO2: 24 mEq/L (ref 22–29)
Chloride: 109 mEq/L (ref 98–109)
Creatinine: 1 mg/dL (ref 0.7–1.3)
EGFR: 78 mL/min/{1.73_m2} — ABNORMAL LOW (ref >90)
GLUCOSE: 105 mg/dL (ref 70–140)
Potassium: 4 mEq/L (ref 3.5–5.1)
SODIUM: 142 meq/L (ref 136–145)
TOTAL PROTEIN: 6.2 g/dL — AB (ref 6.4–8.3)

## 2016-01-06 LAB — LACTATE DEHYDROGENASE: LDH: 210 U/L (ref 125–245)

## 2016-01-06 LAB — CBC WITH DIFFERENTIAL/PLATELET
BASO%: 0.7 % (ref 0.0–2.0)
BASOS ABS: 0 10*3/uL (ref 0.0–0.1)
EOS ABS: 0.5 10*3/uL (ref 0.0–0.5)
EOS%: 7 % (ref 0.0–7.0)
HEMATOCRIT: 28.2 % — AB (ref 38.4–49.9)
HEMOGLOBIN: 9.1 g/dL — AB (ref 13.0–17.1)
LYMPH#: 1.3 10*3/uL (ref 0.9–3.3)
LYMPH%: 19.7 % (ref 14.0–49.0)
MCH: 32.6 pg (ref 27.2–33.4)
MCHC: 32.3 g/dL (ref 32.0–36.0)
MCV: 100.9 fL — AB (ref 79.3–98.0)
MONO#: 0.7 10*3/uL (ref 0.1–0.9)
MONO%: 10.3 % (ref 0.0–14.0)
NEUT%: 62.3 % (ref 39.0–75.0)
NEUTROS ABS: 4.2 10*3/uL (ref 1.5–6.5)
Platelets: 199 10*3/uL (ref 140–400)
RBC: 2.8 10*6/uL — ABNORMAL LOW (ref 4.20–5.82)
RDW: 16 % — AB (ref 11.0–14.6)
WBC: 6.8 10*3/uL (ref 4.0–10.3)

## 2016-01-06 MED ORDER — INTEGRA PLUS PO CAPS: 1.0000 | ORAL_CAPSULE | Freq: Every morning | ORAL | 2 refills | Status: DC

## 2016-01-06 NOTE — Telephone Encounter (Signed)
Gave patient avs report and appointments for September and October  °

## 2016-01-06 NOTE — Progress Notes (Signed)
START ON PATHWAY REGIMEN - Renal Cell  [id number]: Nivolumab 240 mg q14 Days   A cycle is every 14 days:     Nivolumab (Opdivo(R)) 240 mg flat dose in 100 mL NS IV over 60 minutes. Inline filter required (low protein binding) Dose Mod: None Additional Orders: Severe immune-mediated reactions can occur (e.g. pneumonitis, colitis, and hepatitis). See prescribing information for more details including monitoring and required immediate management with steroids. Monitor thyroid, renal, liver  function tests, glucose, and sodium at baseline and periodically during therapy.  **Always confirm dose/schedule in your pharmacy ordering system**    Patient Characteristics: Metastatic, Clear Cell, Second Line, Prior Anti-Angiogenic Treatment AJCC Stage Grouping: IV Current evidence of distant metastases? Yes AJCC T Stage: 3a AJCC N Stage: 0 AJCC M Stage: 1 Does patient have oligometastatic disease? No Would you be surprised if this patient died  in the next year? I would be surprised if this patient died in the next year Histology: Clear Cell Line of therapy: Second Line  Intent of Therapy: Non-Curative / Palliative Intent, Discussed with Patient

## 2016-01-06 NOTE — Progress Notes (Signed)
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 This was initially diagnosed as a stage III status post left nephrectomy as well as left adrenalectomy on 03/06/2012. 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  PRIOR THERAPY:  1) Stereotactic radiotherapy to the solitary brain lesion scheduled for 08/20/2015. 20  Votrient 800 mg by mouth daily started 08/08/2015. His dose will be reduced to Votrient 600 mg by mouth daily starting 10/09/2015.   CURRENT THERAPY: Nivolumab 240 MG IV every 2 weeks. First dose 01/13/2016  INTERVAL HISTORY: Patrick Breed Sr. 70 y.o. male returns to the clinic today for follow-up visit. The patient is feeling much better today. Has been off treatment with Votrient for the last 2-3 weeks secondary to intolerance. He denied having any significant chest pain but continues to have shortness breath with exertion with no cough or hemoptysis. He has no fever or chills. He has no nausea or vomiting. The patient denied having any significant weight loss or night sweats. He had repeat CT scan of the chest, abdomen and pelvis performed recently and he is here for evaluation and discussion of his scan results.  MEDICAL HISTORY: Past Medical History:  Diagnosis Date  . AAA (abdominal aortic aneurysm) ([place])   . Anxiety   . Asthma   . Cancer [facility])    Renal Cell- 2013; Lung 2017  . Chronic kidney disease    Renal Cell Adenocarcinoma  . Chronic kidney disease    Left Nephrectomy  . COPD (chronic obstructive pulmonary disease) ([place])   . Coronary artery disease   . Dehydration 12/16/2015  .  Depression   . Diabetes mellitus without complication ([place])    Type II  . Eczema   . Encounter for antineoplastic chemotherapy 08/19/2015  . GERD (gastroesophageal reflux disease)   . Headache   . Heart murmur   . HPTH (hyperparathyroidism) ([place])   . Hx of radiation therapy 08/20/15   Right Temporal brain  . Hyperlipidemia   . Hyperparathyroidism ([place])   . Hypertension   . Hypertension 08/19/2015  . Hypothyroidism   . Malignant neoplasm of left kidney ([place]) 02/09/2013  . Neuropathy ([place])   . Shortness of breath dyspnea   . Sleep apnea    CPAP  . Thoracic aortic aneurysm ([place]) 2017    ALLERGIES:  is allergic to amoxicillin; codeine; penicillins; and ace inhibitors.  MEDICATIONS:  Current Outpatient Prescriptions  Medication Sig Dispense Refill  . ADVAIR DISKUS 100-50 MCG/DOSE AEPB INHALE 1 PUFF TWICE A DAY (Patient taking differently: INHALE 1 PUFF BY MOUTH TWICE A DAY AS NEEDED FOR SOB) 60 each 12  . albuterol (PROVENTIL HFA;VENTOLIN HFA) 108 (90 BASE) MCG/ACT inhaler Inhale 1 puff into the lungs every 6 (six) hours as needed for wheezing.     Marland Kitchen ALPRAZolam (XANAX) 0.5 MG tablet TAKE ONE TABLET BY MOUTH TWICE DAILY AS NEEDED (Patient taking differently: TAKE ONE TABLET BY MOUTH TWICE DAILY AS NEEDED FOR ANXIETY) 60 tablet 5  . amLODipine (NORVASC) 10 MG tablet Take 1 tablet (10 mg total) by mouth daily. 30 tablet 1  . aspirin 81 MG tablet Take 81 mg by  mouth at bedtime. Reported on 07/01/2015    . calcium carbonate (OS-CAL - DOSED IN MG OF ELEMENTAL CALCIUM) 1250 (500 Ca) MG tablet Take 1 tablet (500 mg of elemental calcium total) by mouth 2 (two) times daily with a meal.    . Cholecalciferol 1000 UNITS tablet Take 1,000 Units by mouth at bedtime.     Marland Kitchen FLUoxetine (PROZAC) 40 MG capsule TAKE ONE CAPSULE BY MOUTH EVERY DAY (Patient taking differently: TAKE 40 MG BY MOUTH EVERY DAY) 30 capsule 12  . loperamide (IMODIUM) 2 MG capsule Take 2 mg by mouth 2 (two) times daily as needed for  diarrhea or loose stools.     . meclizine (ANTIVERT) 25 MG tablet Take 25 mg by mouth daily as needed for dizziness.     . mometasone (ELOCON) 0.1 % cream Apply 1 application topically daily. Use daily as directed for up 2 weeks out of the month (Patient taking differently: Apply 1 application topically daily as needed (burning). ) 45 g 2  . ONE TOUCH ULTRA TEST test strip TEST TWICE DAILY 100 each 1  . ONETOUCH DELICA LANCETS 58I MISC TEST TWICE DAILY 100 each 5  . prochlorperazine (COMPAZINE) 10 MG tablet   0  . VOTRIENT 200 MG tablet TAKE 4 TABLETS BY MOUTH DAILY. TAKE ON AN EMPTY STOMACH. 120 tablet 2   No current facility-administered medications for this visit.     SURGICAL HISTORY:  Past Surgical History:  Procedure Laterality Date  . BACK SURGERY     compressed vertebrae-put in a plate to seprate the spaces  . CARDIAC CATHETERIZATION    . CERVICAL FUSION    . CORONARY ARTERY BYPASS GRAFT     x 5  . ENDOBRONCHIAL ULTRASOUND N/A 06/30/2015   Procedure: ENDOHIAL ULTRASOUND;  Surgeon: Flora Lipps, MD;  Location: [facility] ORS;  Service: Cardiopulmonary;  Laterality: N/A;  . FRACTURE SURGERY     left arm repair  . KNEE SURGERY     left knee repair  . PARATHYROIDECTOMY  12/17/09  . VIDEO BRONCHOSCOPY WITH ENDOBRONCHIAL ULTRASOUND N/A 07/09/2015   Procedure: VIDEO BRONCHOSCOPY WITH ENDOBRONCHIAL ULTRASOUND;  Surgeon: Ivin Poot, MD;  Location: [place];  Service: Thoracic;  Laterality: N/A;    REVIEW OF SYSTEMS:  Constitutional: positive for fatigue Eyes: negative Ears, nose, mouth, throat, and face: negative Respiratory: negative Cardiovascular: negative Gastrointestinal: negative Genitourinary:negative Integument/breast: negative Hematologic/lymphatic: negative Musculoskeletal:negative Neurological: negative Behavioral/Psych: negative Endocrine: negative Allergic/Immunologic: negative   PHYSICAL EXAMINATION: General appearance: alert, cooperative, fatigued and no  distress Head: Normocephalic, without obvious abnormality, atraumatic Neck: no adenopathy, no JVD, supple, symmetrical, trachea midline and thyroid not enlarged, symmetric, no tenderness/mass/nodules Lymph nodes: Cervical, supraclavicular, and axillary nodes normal. Resp: clear to auscultation bilaterally Back: symmetric, no curvature. ROM normal. No CVA tenderness. Cardio: regular rate and rhythm, S1, S2 normal, no murmur, click, rub or gallop GI: soft, non-tender; bowel sounds normal; no masses,  no organomegaly Extremities: extremities normal, atraumatic, no cyanosis or edema Neurologic: Alert and oriented X 3, normal strength and tone. Normal symmetric reflexes. Normal coordination and gait  ECOG PERFORMANCE STATUS: 1 - Symptomatic but completely ambulatory  Blood pressure 131/74, pulse 82, temperature 98 F (36.7 C), temperature source Oral, resp. rate 17, height '5\' 11"'$  (1.803 m), weight 240 lb 11.2 oz (109.2 kg), SpO2 99 %.  LABORATORY DATA: Lab Results  Component Value Date   WBC 6.8 01/06/2016   HGB 9.1 (L) 01/06/2016   HCT 28.2 (L) 01/06/2016   MCV 100.9 (  H) 01/06/2016   PLT 199 01/06/2016      Chemistry      Component Value Date/Time   NA 142 01/06/2016 0931   K 4.0 01/06/2016 0931   CL 101 12/30/2015 1640   CL 103 01/19/2012 0834   CO2 24 01/06/2016 0931   BUN 21.2 01/06/2016 0931   CREATININE 1.0 01/06/2016 0931   GLU 159 11/21/2013      Component Value Date/Time   CALCIUM 9.2 01/06/2016 0931   ALKPHOS 87 01/06/2016 0931   AST 37 (H) 01/06/2016 0931   ALT 30 01/06/2016 0931   BILITOT 0.57 01/06/2016 0931       RADIOGRAPHIC STUDIES: Ct Abdomen Pelvis Wo Contrast  Result Date: 12/22/2015 CLINICAL DATA:  70 year old male with history of renal cell carcinoma with known metastatic disease to the lung, brain and mediastinum, currently undergoing oral chemotherapy after nephrectomy. EXAM: CT ABDOMEN AND PELVIS WITHOUT CONTRAST TECHNIQUE: Multidetector CT  imaging of the abdomen and pelvis was performed following the standard protocol without IV contrast. COMPARISON:  PET-CT 06/24/2015. FINDINGS: Lower chest: Tiny calcified granuloma in the right lower lobe. No other suspicious pulmonary nodules in the visualize lung bases. Atherosclerotic calcifications in the right coronary artery. Mild cardiomegaly. Postoperative changes of median sternotomy for CABG. Hepatobiliary: No definite cystic or solid hepatic lesions are identified on today's noncontrast CT examination. Unenhanced appearance of the gallbladder is normal. Pancreas: No definite pancreatic mass or peripancreatic inflammatory changes on today's noncontrast CT examination. Spleen: Capsular calcifications associated with the lateral aspect of the spleen are unchanged, presumably related to prior trauma or prior surgery. Otherwise, unremarkable. Adrenals/Urinary Tract: Postoperative changes of left radical nephrectomy with some architectural distortion in the upper left retroperitoneal fat, presumably some chronic postoperative scarring. No definite soft tissue mass in the nephrectomy bed to suggest local recurrence of disease. Unenhanced appearance of the right kidney and bilateral adrenal glands is unremarkable. No right-sided hydroureteronephrosis. Unenhanced appearance of the urinary bladder is normal. Stomach/Bowel: Unenhanced appearance of the stomach is normal. No pathologic dilatation of small bowel or colon. A few scattered colonic diverticulae are noted, without surrounding inflammatory changes to suggest an acute diverticulitis at this time. Normal appendix. Vascular/Lymphatic: Aortic atherosclerosis, in addition to calcified atherosclerotic plaque throughout many other abdominal and pelvic arteries, without definite aneurysm. No lymphadenopathy noted in the abdomen or pelvis. Reproductive: Prostate gland and seminal vesicles are unremarkable in appearance. Other: No significant volume of ascites.  No  pneumoperitoneum. Musculoskeletal: There are no aggressive appearing lytic or blastic lesions noted in the visualized portions of the skeleton. IMPRESSION: 1. Status post left radical nephrectomy, without evidence to suggest local recurrence of disease or definite metastatic disease in the abdomen or pelvis on today's noncontrast CT examination. 2. Mild colonic diverticulosis without evidence of acute diverticulitis at this time. 3. Aortic atherosclerosis, in addition to at least right coronary artery disease. Status post median sternotomy for CABG. Electronically Signed   By: Vinnie Langton M.D.   On: 12/22/2015 18:03   Dg Chest 2 View  Result Date: 12/22/2015 CLINICAL DATA:  Renal cell carcinoma with metastases. Nonproductive cough. EXAM: CHEST  2 VIEW COMPARISON:  09/22/2015 FINDINGS: Postoperative changes from prior CABG. Mild cardiomegaly. No confluent airspace opacities or effusions. No acute bony abnormality. IMPRESSION: No active disease. Electronically Signed   By: [name] M.D.   On: 12/22/2015 11:18   Ct Head Wo Contrast  Result Date: 12/08/2015 CLINICAL DATA:  Patient woke with nausea. Earlier today the patient experienced LEFT  arm numbness which resolved. EXAM: CT HEAD WITHOUT CONTRAST TECHNIQUE: Contiguous axial images were obtained from the base of the skull through the vertex without intravenous contrast. COMPARISON:  MR brain most recent 11/03/2015. FINDINGS: No evidence for acute infarction, hemorrhage, mass lesion, hydrocephalus, or extra-axial fluid. Mild cerebral and cerebellar atrophy. Chronic microvascular ischemic change. Vascular calcification. Calvarium intact. No sinus or mastoid disease. IMPRESSION: Stable exam.  No acute intracranial findings. Electronically Signed   By: [name] M.D.   On: 12/08/2015 21:16   Ct Chest Wo Contrast  Result Date: 01/02/2016 CLINICAL DATA:  Renal cell carcinoma with metastasis to lung and brain diagnosed in 2014 and 2017. Left  nephrectomy. EXAM: CT CHEST WITHOUT CONTRAST TECHNIQUE: Multidetector CT imaging of the chest was performed following the standard protocol without IV contrast. COMPARISON:  09/22/2015 FINDINGS: Cardiovascular: ascending aortic dilatation, including at 5.1 cm on image 80/series 2. This is similar. No surrounding hemorrhage. Aortic and branch vessel atherosclerosis. Tortuous descending thoracic aorta. Prior median sternotomy. Mild cardiomegaly. No pericardial effusion. Mediastinum/Nodes: No supraclavicular adenopathy. Infiltrative mass/ adenopathy in the AP window region. This measures on the order of 5.8 x 3.6 cm on image 63/ series 2. Compare 5.5 x 3.4 cm on the prior exam. Slight increase in extension more superiorly in the AP window, including on image 53/ series 2 today. Subcarinal adenopathy at 1.9 cm on image 77/series 2. Compare 1.8 cm on the prior exam (when remeasured). Soft tissue fullness about the left infrahilar region is suboptimally evaluated without IV contrast on image 84/series 2. Felt to be similar. Lungs/Pleura: Left-sided pleural thickening or fluid is slightly increased. Mild centrilobular emphysema. Thickening or nodularity along the right major fissure is felt to be similar, including at 4 mm on image 71/series 5. Left apical mild nodularity is not significantly changed. A left upper lobe 5 mm nodule on image 54/series 5 is similar. More inferior left upper lobe 7 mm nodule on image 73/series 5 is unchanged (when remeasured). Spiculated superior segment left lower lobe lung mass measures on the order of 4.8 x 2.9 cm on image 80/series 5. Compare 3.9 x 2.2 cm on the prior exam (when remeasured). Upper Abdomen: Normal imaged portions of the liver, stomach, pancreas, right adrenal gland, and right kidney. Calcification along the splenic capsule may relate to prior trauma. Left nephrectomy. Musculoskeletal: Lower cervical spine fixation. A right-sided T7 sclerotic lesion is likely a bone island.  IMPRESSION: 1. Mild progression of thoracic adenopathy, with a dominant infiltrative mass in the AP window. 2. Enlargement of a superior segment left lower lobe lung mass. Smaller pulmonary nodules are similar. 3. Increasing trace left pleural fluid or thickening. Electronically Signed   By: Abigail Miyamoto M.D.   On: 01/02/2016 16:56    ASSESSMENT AND PLAN: This is a very pleasant 70 years old white male with metastatic renal cell carcinoma currently on treatment with Votrient 800 mg by mouth daily status post 2 months of treatment and he has been tolerating his treatment well except for persistent fatigue. His dose was a change it to Votrient 600 mg by mouth daily since 10/09/2015 and he was tolerating it much better but recently started having more fatigue and diarrhea. His treatment has been on hold recently. The recent CT scan of the chest, abdomen and pelvis showed evidence for disease progression of the thoracic adenopathy as well as the left lower lobe lung mass. I discussed the scan results with the patient today. I recommended for him to discontinue his  treatment with Votrient at this point secondary to disease progression. I discussed with the patient other treatment options including treatment with immunotherapy with Nivolumab 240 mg IV every 2 weeks. I discussed with the patient adverse effect of this treatment including but not limited to immune mediated skin rash, diarrhea, or inflammation of the lung, kidney, liver, thyroid or other endocrine dysfunction. He would like to proceed with the treatment as planned and he is expected to start the first dose of this treatment on 01/13/2016. For the anemia, I will start the patient on Integra +1 capsule by mouth daily. He would come back for follow-up visit in 3 weeks for reevaluation with the start of cycle #2 of his treatment. The patient was advised to call immediately if he has any concerning symptoms in the interval. The patient voices  understanding of current disease status and treatment options and is in agreement with the current care plan.  All questions were answered. The patient knows to call the clinic with any problems, questions or concerns. We can certainly see the patient much sooner if necessary.  Disclaimer: This note was dictated with voice recognition software. Similar sounding words can inadvertently be transcribed and may not be corrected upon review.

## 2016-01-12 ENCOUNTER — Other Ambulatory Visit: Payer: Self-pay | Admitting: *Deleted

## 2016-01-12 ENCOUNTER — Telehealth: Payer: Self-pay | Admitting: *Deleted

## 2016-01-12 DIAGNOSIS — C7931 Secondary malignant neoplasm of brain: Secondary | ICD-10-CM

## 2016-01-12 DIAGNOSIS — C7949 Secondary malignant neoplasm of other parts of nervous system: Principal | ICD-10-CM

## 2016-01-12 NOTE — Telephone Encounter (Signed)
Pt called advised he played golf this week and he has some blood streaks in it only 2-3 times. Pt states this happened when I first started coming to see Dr. [name]. I am going to have a  PICC line put in tomorrow so I just wanted to let MD know. Discussed with pt if he starts coughing up blood to proceed to ED. Pt verbalized understanding.

## 2016-01-13 ENCOUNTER — Ambulatory Visit (HOSPITAL_BASED_OUTPATIENT_CLINIC_OR_DEPARTMENT_OTHER): Payer: Medicare Other

## 2016-01-13 ENCOUNTER — Other Ambulatory Visit (HOSPITAL_BASED_OUTPATIENT_CLINIC_OR_DEPARTMENT_OTHER): Payer: Medicare Other

## 2016-01-13 VITALS — BP 115/73 | HR 80 | Temp 97.6°F | Resp 18

## 2016-01-13 DIAGNOSIS — C7931 Secondary malignant neoplasm of brain: Secondary | ICD-10-CM

## 2016-01-13 DIAGNOSIS — C7801 Secondary malignant neoplasm of right lung: Secondary | ICD-10-CM

## 2016-01-13 DIAGNOSIS — Z5112 Encounter for antineoplastic immunotherapy: Secondary | ICD-10-CM | POA: Diagnosis not present

## 2016-01-13 DIAGNOSIS — C642 Malignant neoplasm of left kidney, except renal pelvis: Secondary | ICD-10-CM

## 2016-01-13 DIAGNOSIS — C781 Secondary malignant neoplasm of mediastinum: Secondary | ICD-10-CM

## 2016-01-13 LAB — CBC WITH DIFFERENTIAL/PLATELET
BASO%: 0.3 % (ref 0.0–2.0)
Basophils Absolute: 0 10*3/uL (ref 0.0–0.1)
EOS%: 6.3 % (ref 0.0–7.0)
Eosinophils Absolute: 0.5 10*3/uL (ref 0.0–0.5)
HCT: 27.8 % — ABNORMAL LOW (ref 38.4–49.9)
HEMOGLOBIN: 9 g/dL — AB (ref 13.0–17.1)
LYMPH#: 1.6 10*3/uL (ref 0.9–3.3)
LYMPH%: 20.7 % (ref 14.0–49.0)
MCH: 32.6 pg (ref 27.2–33.4)
MCHC: 32.4 g/dL (ref 32.0–36.0)
MCV: 100.7 fL — ABNORMAL HIGH (ref 79.3–98.0)
MONO#: 0.6 10*3/uL (ref 0.1–0.9)
MONO%: 7.3 % (ref 0.0–14.0)
NEUT#: 5.1 10*3/uL (ref 1.5–6.5)
NEUT%: 65.4 % (ref 39.0–75.0)
NRBC: 0 % (ref 0–0)
Platelets: 213 10*3/uL (ref 140–400)
RBC: 2.76 10*6/uL — AB (ref 4.20–5.82)
RDW: 15.1 % — AB (ref 11.0–14.6)
WBC: 7.8 10*3/uL (ref 4.0–10.3)

## 2016-01-13 LAB — COMPREHENSIVE METABOLIC PANEL
ALBUMIN: 3 g/dL — AB (ref 3.5–5.0)
ALK PHOS: 123 U/L (ref 40–150)
ALT: 29 U/L (ref 0–55)
AST: 24 U/L (ref 5–34)
Anion Gap: 10 mEq/L (ref 3–11)
BILIRUBIN TOTAL: 0.71 mg/dL (ref 0.20–1.20)
BUN: 16.3 mg/dL (ref 7.0–26.0)
CO2: 25 meq/L (ref 22–29)
Calcium: 9.7 mg/dL (ref 8.4–10.4)
Chloride: 106 mEq/L (ref 98–109)
Creatinine: 1.1 mg/dL (ref 0.7–1.3)
EGFR: 69 mL/min/{1.73_m2} — ABNORMAL LOW (ref >90)
GLUCOSE: 135 mg/dL (ref 70–140)
POTASSIUM: 4.8 meq/L (ref 3.5–5.1)
SODIUM: 141 meq/L (ref 136–145)
TOTAL PROTEIN: 6.6 g/dL (ref 6.4–8.3)

## 2016-01-13 MED ORDER — SODIUM CHLORIDE 0.9 % IV SOLN
Freq: Once | INTRAVENOUS | Status: AC
  Administered 2016-01-13: 11:00:00 via INTRAVENOUS

## 2016-01-13 MED ORDER — SODIUM CHLORIDE 0.9 % IV SOLN
240.0000 mg | Freq: Once | INTRAVENOUS | Status: AC
  Administered 2016-01-13: 240 mg via INTRAVENOUS
  Filled 2016-01-13: qty 20

## 2016-01-13 NOTE — Patient Instructions (Addendum)
[facility] [place] [facility] Discharge Instructions for Patients Receiving Chemotherapy  Today you received the following chemotherapy agents nivolumab  To help prevent nausea and vomiting after your treatment, we encourage you to take your nausea medication as directed  If you develop nausea and vomiting that is not controlled by your nausea medication, call the clinic.   BELOW ARE SYMPTOMS THAT SHOULD BE REPORTED IMMEDIATELY:  *FEVER GREATER THAN 100.5 F  *CHILLS WITH OR WITHOUT FEVER  NAUSEA AND VOMITING THAT IS NOT CONTROLLED WITH YOUR NAUSEA MEDICATION  *UNUSUAL SHORTNESS OF BREATH  *UNUSUAL BRUISING OR BLEEDING  TENDERNESS IN MOUTH AND THROAT WITH OR WITHOUT PRESENCE OF ULCERS  *URINARY PROBLEMS  *BOWEL PROBLEMS  UNUSUAL RASH Items with * indicate a potential emergency and should be followed up as soon as possible.  Feel free to call the clinic you have any questions or concerns. The clinic phone number is [phone].  Nivolumab injection What is this medicine? NIVOLUMAB (nye VOL ue mab) is a monoclonal antibody. It is used to treat melanoma, lung cancer, kidney cancer, and Hodgkin lymphoma. This medicine may be used for other purposes; ask your health care provider or pharmacist if you have questions. What should I tell my health care provider before I take this medicine? They need to know if you have any of these conditions: -diabetes -immune system problems -kidney disease -liver disease -lung disease -organ transplant -stomach or intestine problems -thyroid disease -an unusual or allergic reaction to nivolumab, other medicines, foods, dyes, or preservatives -pregnant or trying to get pregnant -breast-feeding How should I use this medicine? This medicine is for infusion into a vein. It is given by a health care professional in a hospital or clinic setting. A special MedGuide will be given to you before each treatment. Be sure to read this information  carefully each time. Talk to your pediatrician regarding the use of this medicine in children. Special care may be needed. Overdosage: If you think you have taken too much of this medicine contact a poison control center or emergency room at once. NOTE: This medicine is only for you. Do not share this medicine with others. What if I miss a dose? It is important not to miss your dose. Call your doctor or health care professional if you are unable to keep an appointment. What may interact with this medicine? Interactions have not been studied. Give your health care provider a list of all the medicines, herbs, non-prescription drugs, or dietary supplements you use. Also tell them if you smoke, drink alcohol, or use illegal drugs. Some items may interact with your medicine. This list may not describe all possible interactions. Give your health care provider a list of all the medicines, herbs, non-prescription drugs, or dietary supplements you use. Also tell them if you smoke, drink alcohol, or use illegal drugs. Some items may interact with your medicine. What should I watch for while using this medicine? This drug may make you feel generally unwell. Continue your course of treatment even though you feel ill unless your doctor tells you to stop. You may need blood work done while you are taking this medicine. Do not become pregnant while taking this medicine or for 5 months after stopping it. Women should inform their doctor if they wish to become pregnant or think they might be pregnant. There is a potential for serious side effects to an unborn child. Talk to your health care professional or pharmacist for more information. Do not breast-feed an infant  while taking this medicine. What side effects may I notice from receiving this medicine? Side effects that you should report to your doctor or health care professional as soon as possible: -allergic reactions like skin rash, itching or hives, swelling of  the face, lips, or tongue -black, tarry stools -blood in the urine -bloody or watery diarrhea -changes in vision -change in sex drive -changes in emotions or moods -chest pain -confusion -cough -decreased appetite -diarrhea -facial flushing -feeling faint or lightheaded -fever, chills -hair loss -hallucination, loss of contact with reality -headache -irritable -joint pain -loss of memory -muscle pain -muscle weakness -seizures -shortness of breath -signs and symptoms of high blood sugar such as dizziness; dry mouth; dry skin; fruity breath; nausea; stomach pain; increased hunger or thirst; increased urination -signs and symptoms of kidney injury like trouble passing urine or change in the amount of urine -signs and symptoms of liver injury like dark yellow or brown urine; general ill feeling or flu-like symptoms; light-colored stools; loss of appetite; nausea; right upper belly pain; unusually weak or tired; yellowing of the eyes or skin -stiff neck -swelling of the ankles, feet, hands -weight gain Side effects that usually do not require medical attention (report to your doctor or health care professional if they continue or are bothersome): -bone pain -constipation -tiredness -vomiting This list may not describe all possible side effects. Call your doctor for medical advice about side effects. You may report side effects to FDA at 1-800-FDA-1088. Where should I keep my medicine? This drug is given in a hospital or clinic and will not be stored at home. NOTE: This sheet is a summary. It may not cover all possible information. If you have questions about this medicine, talk to your doctor, pharmacist, or health care provider.    2016, [employer]vier/[employer] Standard. (2014-09-11 10:03:42)

## 2016-01-14 ENCOUNTER — Telehealth: Payer: Self-pay | Admitting: *Deleted

## 2016-01-14 NOTE — Telephone Encounter (Signed)
I told pt that per [name] nivolumab does not cause pyrexia. He stated the vein was bruised. I instructed him to monitor for now and to call for recurrent fever or vein redness , inflammation.He does not want a prot at this time.

## 2016-01-14 NOTE — Telephone Encounter (Signed)
Received call from pt stating he had treatment yest & was told to call if he had any problems.  He reports fatigue & temp 101.8 last has & took 2 ibuprofen before going to bed & fever broke during the hs & temp 97.7 this am.  He also wanted to report that he always has trouble with his veins & was informed that he should probably think about a port & he would like to do this.  Message to Dr Mohamed/Pod RN

## 2016-01-14 NOTE — Telephone Encounter (Signed)
Actually it can cause pyrexia but not very common. Will monitor.

## 2016-01-15 ENCOUNTER — Telehealth: Payer: Self-pay | Admitting: Medical Oncology

## 2016-01-15 NOTE — Telephone Encounter (Signed)
I called pt back and he had temp last night of 100.8 -he took advil and it went down. No other symptoms. He slept good and every thing is good today.I told him to call back tomorrow with update.

## 2016-01-19 ENCOUNTER — Encounter: Payer: Self-pay | Admitting: Family Medicine

## 2016-01-19 ENCOUNTER — Ambulatory Visit (INDEPENDENT_AMBULATORY_CARE_PROVIDER_SITE_OTHER): Payer: Medicare Other | Admitting: Family Medicine

## 2016-01-19 VITALS — BP 110/62 | HR 88 | Temp 97.5°F | Resp 16 | Wt 236.0 lb

## 2016-01-19 DIAGNOSIS — E119 Type 2 diabetes mellitus without complications: Secondary | ICD-10-CM | POA: Diagnosis not present

## 2016-01-19 DIAGNOSIS — I10 Essential (primary) hypertension: Secondary | ICD-10-CM

## 2016-01-19 DIAGNOSIS — E785 Hyperlipidemia, unspecified: Secondary | ICD-10-CM | POA: Diagnosis not present

## 2016-01-19 DIAGNOSIS — F329 Major depressive disorder, single episode, unspecified: Secondary | ICD-10-CM | POA: Diagnosis not present

## 2016-01-19 DIAGNOSIS — F32A Depression, unspecified: Secondary | ICD-10-CM

## 2016-01-19 DIAGNOSIS — C642 Malignant neoplasm of left kidney, except renal pelvis: Secondary | ICD-10-CM | POA: Diagnosis not present

## 2016-01-19 LAB — POCT GLYCOSYLATED HEMOGLOBIN (HGB A1C): Hemoglobin A1C: 6.1

## 2016-01-19 NOTE — Progress Notes (Signed)
Subjective:  HPI  Diabetes Mellitus Type II, Follow-up:   Lab Results  Component Value Date   HGBA1C 6.2 (H) 12/20/2015   HGBA1C 7.5 10/15/2015   HGBA1C 7.5 12/09/2014    Last seen for diabetes 3 months ago.  Management since then includes none. He reports good compliance with treatment. He is not having side effects.  Home blood sugar records: 150's  Episodes of hypoglycemia? no   Current Insulin Regimen: n/a Most Recent Eye Exam: pt unsure but thinks he may have had one this year Current exercise: none  Pertinent Labs:    Component Value Date/Time   CHOL 135 11/21/2013   TRIG 165 (A) 11/21/2013   HDL 38 11/21/2013   LDLCALC 64 11/21/2013   CREATININE 1.1 01/13/2016 0944    Wt Readings from Last 3 Encounters:  01/19/16 236 lb (107 kg)  01/06/16 240 lb 11.2 oz (109.2 kg)  12/30/15 234 lb (106.1 kg)    ------------------------------------------------------------------------  Depression- Pt reports that he is doing well emotionally and is taking his Paxil daily.  Renal cell carcinoma Metastatic disease continued to be treated by oncology. Prior to Admission medications   Medication Sig Start Date End Date Taking? Authorizing Provider  ADVAIR DISKUS 100-50 MCG/DOSE AEPB INHALE 1 PUFF TWICE A DAY Patient taking differently: INHALE 1 PUFF BY MOUTH TWICE A DAY AS NEEDED FOR SOB 08/08/15   Andrick Rust Maceo Pro., MD  albuterol (PROVENTIL HFA;VENTOLIN HFA) 108 (90 BASE) MCG/ACT inhaler Inhale 1 puff into the lungs every 6 (six) hours as needed for wheezing.     Historical Provider, MD  ALPRAZolam Duanne Moron) 0.5 MG tablet TAKE ONE TABLET BY MOUTH TWICE DAILY AS NEEDED Patient taking differently: TAKE ONE TABLET BY MOUTH TWICE DAILY AS NEEDED FOR ANXIETY 09/25/15   [name]., MD  amLODipine (NORVASC) 10 MG tablet Take 1 tablet (10 mg total) by mouth daily. 12/22/15   Theodis Blaze, MD  aspirin 81 MG tablet Take 81 mg by mouth at bedtime. Reported on 07/01/2015  05/27/11   Historical Provider, MD  calcium carbonate (OS-CAL - DOSED IN MG OF ELEMENTAL CALCIUM) 1250 (500 Ca) MG tablet Take 1 tablet (500 mg of elemental calcium total) by mouth 2 (two) times daily with a meal. 12/22/15   Theodis Blaze, MD  Cholecalciferol 1000 UNITS tablet Take 1,000 Units by mouth at bedtime.     Historical Provider, MD  FeFum-FePoly-FA-B Cmp-C-Biot (INTEGRA PLUS) CAPS Take 1 capsule by mouth every morning. 01/06/16   Curt Bears, MD  FLUoxetine (PROZAC) 40 MG capsule TAKE ONE CAPSULE BY MOUTH EVERY DAY Patient taking differently: TAKE 40 MG BY MOUTH EVERY DAY 03/25/15   [name]., MD  loperamide (IMODIUM) 2 MG capsule Take 2 mg by mouth 2 (two) times daily as needed for diarrhea or loose stools.     Historical Provider, MD  meclizine (ANTIVERT) 25 MG tablet Take 25 mg by mouth daily as needed for dizziness.  06/18/13   Historical Provider, MD  mometasone (ELOCON) 0.1 % cream Apply 1 application topically daily. Use daily as directed for up 2 weeks out of the month Patient taking differently: Apply 1 application topically daily as needed (burning).  09/26/15   Imaan Padgett Maceo Pro., MD  ONE TOUCH ULTRA TEST test strip TEST TWICE DAILY 12/23/15   Erikson Danzy Maceo Pro., MD  [facility] LANCETS 26V MISC TEST TWICE DAILY 12/23/15   [name]., MD  prochlorperazine (COMPAZINE)  10 MG tablet  12/15/15   Historical Provider, MD    Patient Active Problem List   Diagnosis Date Noted  . Encounter for antineoplastic immunotherapy 01/06/2016  . Hyperkalemia 12/19/2015  . Hypokalemia 12/19/2015  . Diarrhea 12/19/2015  . Acute renal failure (ARF) ([place]) 12/19/2015  . Dehydration 12/16/2015  . Encounter for antineoplastic chemotherapy 08/19/2015  . Hypertension 08/19/2015  . Brain metastasis ([place]) 08/18/2015  . Chronic obstructive pulmonary disease ([place]) 02/13/2015  . Personal history of other malignant neoplasm of kidney 02/13/2015  . Hyperparathyroidism ([place])  02/13/2015  . Type 2 diabetes mellitus ([place]) 02/13/2015  . Arteriosclerosis of coronary artery 12/09/2014  . CAFL (chronic airflow limitation) ([place]) 12/09/2014  . Enterogastritis 12/09/2014  . Acid reflux 12/09/2014  . HPTH (hyperparathyroidism) ([place]) 12/09/2014  . BP (high blood pressure) 12/09/2014  . Eczema intertrigo 12/09/2014  . Obstructive apnea 12/09/2014  . COPD, mild ([place]) 09/21/2014  . Atherosclerosis of coronary artery 09/21/2014  . Clinical depression 09/21/2014  . Diabetes mellitus, type 2 ([place]) 09/21/2014  . Essential (primary) hypertension 09/21/2014  . Cardiac murmur 09/21/2014  . HLD (hyperlipidemia) 09/21/2014  . Adult hypothyroidism 09/21/2014  . Adiposity 09/21/2014  . Adenocarcinoma, renal cell ([place]) 09/21/2014  . Malignant neoplasm of left kidney ([place]) 02/09/2013    Past Medical History:  Diagnosis Date  . AAA (abdominal aortic aneurysm) ([place])   . Anxiety   . Asthma   . Cancer [facility])    Renal Cell- 2013; Lung 2017  . Chronic kidney disease    Renal Cell Adenocarcinoma  . Chronic kidney disease    Left Nephrectomy  . COPD (chronic obstructive pulmonary disease) ([place])   . Coronary artery disease   . Dehydration 12/16/2015  . Depression   . Diabetes mellitus without complication ([place])    Type II  . Eczema   . Encounter for antineoplastic chemotherapy 08/19/2015  . Encounter for antineoplastic immunotherapy 01/06/2016  . GERD (gastroesophageal reflux disease)   . Headache   . Heart murmur   . HPTH (hyperparathyroidism) ([place])   . Hx of radiation therapy 08/20/15   Right Temporal brain  . Hyperlipidemia   . Hyperparathyroidism ([place])   . Hypertension   . Hypertension 08/19/2015  . Hypothyroidism   . Malignant neoplasm of left kidney ([place]) 02/09/2013  . Neuropathy ([place])   . Shortness of breath dyspnea   . Sleep apnea    CPAP  . Thoracic aortic aneurysm [facility]) 2017    Social History   Social History  . Marital status: Married    Spouse name:  N/A  . Number of children: N/A  . Years of education: N/A   Occupational History  . Not on file.   Social History Main Topics  . Smoking status: Former Smoker    Packs/day: 1.50    Years: 35.00    Types: Cigarettes    Quit date: 12/24/2001  . Smokeless tobacco: Never Used  . Alcohol use No  . Drug use: No  . Sexual activity: Not Currently   Other Topics Concern  . Not on file   Social History Narrative  . No narrative on file    Allergies  Allergen Reactions  . Amoxicillin Rash and Other (See Comments)    Other reaction(s): ANAPHYLAXIS  . Codeine Rash and Other (See Comments)    Other reaction(s): ANAPHYLAXIS  . Penicillins Rash and Other (See Comments)    Other reaction(s): ANAPHYLAXIS Has patient had a PCN reaction causing immediate rash,    . Ace  Inhibitors Cough    Review of Systems  Constitutional: Positive for fever (low grade due to cancer treatment).  HENT: Negative.   Eyes: Negative.   Respiratory: Negative.   Cardiovascular: Negative.   Gastrointestinal: Negative.   Genitourinary: Negative.   Musculoskeletal: Negative.   Skin: Negative.   Neurological: Negative.   Endo/Heme/Allergies: Negative.   Psychiatric/Behavioral: Negative.     Immunization History  Administered Date(s) Administered  . Influenza, High Dose Seasonal PF 03/13/2015  . Pneumococcal Conjugate-13 03/28/2014  . Pneumococcal Polysaccharide-23 02/12/1998, 11/16/2011  . Td 04/11/2007   Objective:  BP 110/62 (BP Location: Left Arm, Patient Position: Sitting, Cuff Size: Normal)   Pulse 88   Temp 97.5 F (36.4 C) (Oral)   Resp 16   Wt 236 lb (107 kg)   BMI 32.92 kg/m   Physical Exam  Constitutional: He is oriented to person, place, and time and well-developed, well-nourished, and in no distress.  HENT:  Head: Normocephalic and atraumatic.  Right Ear: External ear normal.  Left Ear: External ear normal.  Nose: Nose normal.  Eyes: Conjunctivae are normal.  Neck: Neck  supple.  Cardiovascular: Normal rate, regular rhythm and normal heart sounds.   Pulmonary/Chest: Effort normal and breath sounds normal.  Abdominal: Soft.  Musculoskeletal: He exhibits edema.  1+ lower extremity edema  Neurological: He is alert and oriented to person, place, and time.  Skin: Skin is warm and dry.  Psychiatric: Mood, memory, affect and judgment normal.    Lab Results  Component Value Date   WBC 7.8 01/13/2016   HGB 9.0 (L) 01/13/2016   HCT 27.8 (L) 01/13/2016   PLT 213 01/13/2016   GLUCOSE 135 01/13/2016   CHOL 135 11/21/2013   TRIG 165 (A) 11/21/2013   HDL 38 11/21/2013   LDLCALC 64 11/21/2013   TSH 1.874 12/20/2015   PSA 2.0 11/21/2013   INR 1.10 07/22/2015   HGBA1C 6.2 (H) 12/20/2015    CMP     Component Value Date/Time   NA 141 01/13/2016 0944   K 4.8 01/13/2016 0944   CL 101 12/30/2015 1640   CL 103 01/19/2012 0834   CO2 25 01/13/2016 0944   GLUCOSE 135 01/13/2016 0944   BUN 16.3 01/13/2016 0944   CREATININE 1.1 01/13/2016 0944   CALCIUM 9.7 01/13/2016 0944   PROT 6.6 01/13/2016 0944   ALBUMIN 3.0 (L) 01/13/2016 0944   AST 24 01/13/2016 0944   ALT 29 01/13/2016 0944   ALKPHOS 123 01/13/2016 0944   BILITOT 0.71 01/13/2016 0944   GFRNONAA 51 (L) 12/30/2015 1640   GFRNONAA >60 01/19/2012 0834   GFRAA 59 (L) 12/30/2015 1640   GFRAA >60 01/19/2012 0834    Assessment and Plan :  1. Type 2 diabetes mellitus without complication, without long-term current use of insulin (HCC)  - POCT HgB A1C--6.1 today  2. Essential hypertension   3. Clinical depression   4. HLD (hyperlipidemia)   5. Malignant neoplasm of left kidney (HCC) Metastatic renal cell carcinoma followed by oncology 6. CAD 7. AAA  Miguel Aschoff MD [facility] 01/19/2016 2:07 PM

## 2016-01-21 ENCOUNTER — Other Ambulatory Visit: Payer: Self-pay | Admitting: Family Medicine

## 2016-01-23 ENCOUNTER — Telehealth: Payer: Self-pay | Admitting: Medical Oncology

## 2016-01-23 NOTE — Telephone Encounter (Signed)
Asking about brain MRI- confirmed appt.

## 2016-01-27 ENCOUNTER — Encounter: Payer: Self-pay | Admitting: Internal Medicine

## 2016-01-27 ENCOUNTER — Other Ambulatory Visit (HOSPITAL_BASED_OUTPATIENT_CLINIC_OR_DEPARTMENT_OTHER): Payer: Medicare Other

## 2016-01-27 ENCOUNTER — Telehealth: Payer: Self-pay | Admitting: Internal Medicine

## 2016-01-27 ENCOUNTER — Ambulatory Visit (HOSPITAL_BASED_OUTPATIENT_CLINIC_OR_DEPARTMENT_OTHER): Payer: Medicare Other | Admitting: Internal Medicine

## 2016-01-27 ENCOUNTER — Ambulatory Visit (HOSPITAL_BASED_OUTPATIENT_CLINIC_OR_DEPARTMENT_OTHER): Payer: Medicare Other

## 2016-01-27 VITALS — BP 123/56 | HR 75 | Temp 97.7°F | Resp 17 | Ht 71.0 in | Wt 235.5 lb

## 2016-01-27 DIAGNOSIS — C7802 Secondary malignant neoplasm of left lung: Secondary | ICD-10-CM

## 2016-01-27 DIAGNOSIS — C642 Malignant neoplasm of left kidney, except renal pelvis: Secondary | ICD-10-CM

## 2016-01-27 DIAGNOSIS — C7931 Secondary malignant neoplasm of brain: Secondary | ICD-10-CM

## 2016-01-27 DIAGNOSIS — Z79899 Other long term (current) drug therapy: Secondary | ICD-10-CM

## 2016-01-27 DIAGNOSIS — Z5112 Encounter for antineoplastic immunotherapy: Secondary | ICD-10-CM

## 2016-01-27 DIAGNOSIS — D649 Anemia, unspecified: Secondary | ICD-10-CM

## 2016-01-27 DIAGNOSIS — R918 Other nonspecific abnormal finding of lung field: Secondary | ICD-10-CM

## 2016-01-27 DIAGNOSIS — C781 Secondary malignant neoplasm of mediastinum: Secondary | ICD-10-CM | POA: Diagnosis not present

## 2016-01-27 DIAGNOSIS — R599 Enlarged lymph nodes, unspecified: Secondary | ICD-10-CM

## 2016-01-27 DIAGNOSIS — R5383 Other fatigue: Secondary | ICD-10-CM

## 2016-01-27 DIAGNOSIS — R197 Diarrhea, unspecified: Secondary | ICD-10-CM

## 2016-01-27 LAB — COMPREHENSIVE METABOLIC PANEL
ALT: 13 U/L (ref 0–55)
ANION GAP: 11 meq/L (ref 3–11)
AST: 16 U/L (ref 5–34)
Albumin: 3 g/dL — ABNORMAL LOW (ref 3.5–5.0)
Alkaline Phosphatase: 99 U/L (ref 40–150)
BUN: 13.6 mg/dL (ref 7.0–26.0)
CALCIUM: 9.9 mg/dL (ref 8.4–10.4)
CHLORIDE: 105 meq/L (ref 98–109)
CO2: 23 meq/L (ref 22–29)
CREATININE: 1 mg/dL (ref 0.7–1.3)
EGFR: 76 mL/min/{1.73_m2} — AB (ref >90)
Glucose: 120 mg/dl (ref 70–140)
POTASSIUM: 4 meq/L (ref 3.5–5.1)
Sodium: 139 mEq/L (ref 136–145)
Total Bilirubin: 0.67 mg/dL (ref 0.20–1.20)
Total Protein: 6.9 g/dL (ref 6.4–8.3)

## 2016-01-27 LAB — CBC WITH DIFFERENTIAL/PLATELET
BASO%: 1.1 % (ref 0.0–2.0)
BASOS ABS: 0.1 10*3/uL (ref 0.0–0.1)
EOS%: 6.6 % (ref 0.0–7.0)
Eosinophils Absolute: 0.6 10*3/uL — ABNORMAL HIGH (ref 0.0–0.5)
HEMATOCRIT: 27.8 % — AB (ref 38.4–49.9)
HGB: 9.1 g/dL — ABNORMAL LOW (ref 13.0–17.1)
LYMPH#: 1.5 10*3/uL (ref 0.9–3.3)
LYMPH%: 16.5 % (ref 14.0–49.0)
MCH: 33 pg (ref 27.2–33.4)
MCHC: 32.9 g/dL (ref 32.0–36.0)
MCV: 100.4 fL — AB (ref 79.3–98.0)
MONO#: 0.6 10*3/uL (ref 0.1–0.9)
MONO%: 6.9 % (ref 0.0–14.0)
NEUT#: 6.1 10*3/uL (ref 1.5–6.5)
NEUT%: 68.9 % (ref 39.0–75.0)
PLATELETS: 331 10*3/uL (ref 140–400)
RBC: 2.77 10*6/uL — AB (ref 4.20–5.82)
RDW: 14 % (ref 11.0–14.6)
WBC: 8.8 10*3/uL (ref 4.0–10.3)

## 2016-01-27 LAB — LACTATE DEHYDROGENASE: LDH: 222 U/L (ref 125–245)

## 2016-01-27 LAB — TSH: TSH: 1.235 m[IU]/L (ref 0.320–4.118)

## 2016-01-27 MED ORDER — SODIUM CHLORIDE 0.9 % IV SOLN
240.0000 mg | Freq: Once | INTRAVENOUS | Status: AC
  Administered 2016-01-27: 240 mg via INTRAVENOUS
  Filled 2016-01-27: qty 20

## 2016-01-27 MED ORDER — SODIUM CHLORIDE 0.9 % IV SOLN
Freq: Once | INTRAVENOUS | Status: AC
Start: 2016-01-27 — End: 2016-01-27
  Administered 2016-01-27: 12:00:00 via INTRAVENOUS

## 2016-01-27 NOTE — Progress Notes (Signed)
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 This was initially diagnosed as a stage III status post left nephrectomy as well as left adrenalectomy on 03/06/2012. 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  PRIOR THERAPY:  1) Stereotactic radiotherapy to the solitary brain lesion scheduled for 08/20/2015. 20  Votrient 800 mg by mouth daily started 08/08/2015. His dose will be reduced to Votrient 600 mg by mouth daily starting 10/09/2015.   CURRENT THERAPY: Nivolumab 240 MG IV every 2 weeks. First dose 01/13/2016. Status post one cycle  INTERVAL HISTORY: [name]. 70 y.o. male returns to the clinic today for follow-up visit. The patient is feeling much better today. He was started recently on immunotherapy with Nivolumab status post 1 cycle. He tolerated the first cycle of his treatment well except for by anorexia 2 days after the treatment resolved with Tylenol and ibuprofen. He was able to play golf this weekend. He was started recently on oral iron tablets and he noticed some black stool after starting the iron. He denied having any significant chest pain but continues to have shortness breath with exertion with no cough or hemoptysis. He has no fever or chills. He has no nausea or vomiting. The patient denied having any significant weight loss or night sweats. He is here today to start cycle #2 of his treatment.  MEDICAL HISTORY: Past Medical History:  Diagnosis Date  . AAA (abdominal aortic aneurysm) ([place])   . Anxiety   . Asthma   . Cancer [facility])    Renal Cell- 2013; Lung 2017  . Chronic kidney  disease    Renal Cell Adenocarcinoma  . Chronic kidney disease    Left Nephrectomy  . COPD (chronic obstructive pulmonary disease) ([place])   . Coronary artery disease   . Dehydration 12/16/2015  . Depression   . Diabetes mellitus without complication ([place])    Type II  . Eczema   . Encounter for antineoplastic chemotherapy 08/19/2015  . Encounter for antineoplastic immunotherapy 01/06/2016  . GERD (gastroesophageal reflux disease)   . Headache   . Heart murmur   . HPTH (hyperparathyroidism) ([place])   . Hx of radiation therapy 08/20/15   Right Temporal brain  . Hyperlipidemia   . Hyperparathyroidism ([place])   . Hypertension   . Hypertension 08/19/2015  . Hypothyroidism   . Malignant neoplasm of left kidney ([place]) 02/09/2013  . Neuropathy ([place])   . Shortness of breath dyspnea   . Sleep apnea    CPAP  . Thoracic aortic aneurysm ([place]) 2017    ALLERGIES:  is allergic to amoxicillin; codeine; penicillins; and ace inhibitors.  MEDICATIONS:  Current Outpatient Prescriptions  Medication Sig Dispense Refill  . ADVAIR DISKUS 100-50 MCG/DOSE AEPB INHALE 1 PUFF TWICE A DAY (Patient not taking: Reported on 01/19/2016) 60 each 12  . albuterol (PROVENTIL HFA;VENTOLIN HFA) 108 (90 BASE) MCG/ACT inhaler Inhale 1 puff into the lungs every 6 (six) hours as needed for wheezing.     Marland Kitchen ALPRAZolam (XANAX) 0.5 MG tablet TAKE ONE TABLET BY MOUTH TWICE DAILY AS NEEDED (Patient taking differently: TAKE ONE TABLET BY MOUTH TWICE DAILY AS  NEEDED FOR ANXIETY) 60 tablet 5  . amLODipine (NORVASC) 10 MG tablet TAKE ONE TABLET BY MOUTH EVERY DAY 30 tablet 11  . aspirin 81 MG tablet Take 81 mg by mouth at bedtime. Reported on 07/01/2015    . calcium carbonate (OS-CAL - DOSED IN MG OF ELEMENTAL CALCIUM) 1250 (500 Ca) MG tablet Take 1 tablet (500 mg of elemental calcium total) by mouth 2 (two) times daily with a meal.    . Cholecalciferol 1000 UNITS tablet Take 1,000 Units by mouth at bedtime.     . FeFum-FePoly-FA-B  Cmp-C-Biot (INTEGRA PLUS) CAPS Take 1 capsule by mouth every morning. 30 capsule 2  . FLUoxetine (PROZAC) 40 MG capsule TAKE ONE CAPSULE BY MOUTH EVERY DAY (Patient taking differently: TAKE 40 MG BY MOUTH EVERY DAY) 30 capsule 12  . loperamide (IMODIUM) 2 MG capsule Take 2 mg by mouth 2 (two) times daily as needed for diarrhea or loose stools.     . meclizine (ANTIVERT) 25 MG tablet Take 25 mg by mouth daily as needed for dizziness.     . mometasone (ELOCON) 0.1 % cream Apply 1 application topically daily. Use daily as directed for up 2 weeks out of the month (Patient taking differently: Apply 1 application topically daily as needed (burning). ) 45 g 2  . ONE TOUCH ULTRA TEST test strip TEST TWICE DAILY 100 each 1  . ONETOUCH DELICA LANCETS 09U MISC TEST TWICE DAILY 100 each 5  . prochlorperazine (COMPAZINE) 10 MG tablet   0   No current facility-administered medications for this visit.     SURGICAL HISTORY:  Past Surgical History:  Procedure Laterality Date  . BACK SURGERY     compressed vertebrae-put in a plate to seprate the spaces  . CARDIAC CATHETERIZATION    . CERVICAL FUSION    . CORONARY ARTERY BYPASS GRAFT     x 5  . ENDOBRONCHIAL ULTRASOUND N/A 06/30/2015   Procedure: ENDOHIAL ULTRASOUND;  Surgeon: Flora Lipps, MD;  Location: [facility] ORS;  Service: Cardiopulmonary;  Laterality: N/A;  . FRACTURE SURGERY     left arm repair  . KNEE SURGERY     left knee repair  . PARATHYROIDECTOMY  12/17/09  . VIDEO BRONCHOSCOPY WITH ENDOBRONCHIAL ULTRASOUND N/A 07/09/2015   Procedure: VIDEO BRONCHOSCOPY WITH ENDOBRONCHIAL ULTRASOUND;  Surgeon: Ivin Poot, MD;  Location: [facility] OR;  Service: Thoracic;  Laterality: N/A;    REVIEW OF SYSTEMS:  A comprehensive review of systems was negative except for: Constitutional: positive for fatigue   PHYSICAL EXAMINATION: General appearance: alert, cooperative, fatigued and no distress Head: Normocephalic, without obvious abnormality, atraumatic Neck: no  adenopathy, no JVD, supple, symmetrical, trachea midline and thyroid not enlarged, symmetric, no tenderness/mass/nodules Lymph nodes: Cervical, supraclavicular, and axillary nodes normal. Resp: clear to auscultation bilaterally Back: symmetric, no curvature. ROM normal. No CVA tenderness. Cardio: regular rate and rhythm, S1, S2 normal, no murmur, click, rub or gallop GI: soft, non-tender; bowel sounds normal; no masses,  no organomegaly Extremities: extremities normal, atraumatic, no cyanosis or edema Neurologic: Alert and oriented X 3, normal strength and tone. Normal symmetric reflexes. Normal coordination and gait  ECOG PERFORMANCE STATUS: 1 - Symptomatic but completely ambulatory  Blood pressure (!) 123/56, pulse 75, temperature 97.7 F (36.5 C), temperature source Oral, resp. rate 17, height '5\' 11"'$  (1.803 m), weight 235 lb 8 oz (106.8 kg), SpO2 98 %.  LABORATORY DATA: Lab Results  Component Value Date   WBC 8.8 01/27/2016   HGB 9.1 (L)  01/27/2016   HCT 27.8 (L) 01/27/2016   MCV 100.4 (H) 01/27/2016   PLT 331 01/27/2016      Chemistry      Component Value Date/Time   NA 141 01/13/2016 0944   K 4.8 01/13/2016 0944   CL 101 12/30/2015 1640   CL 103 01/19/2012 0834   CO2 25 01/13/2016 0944   BUN 16.3 01/13/2016 0944   CREATININE 1.1 01/13/2016 0944   GLU 159 11/21/2013      Component Value Date/Time   CALCIUM 9.7 01/13/2016 0944   ALKPHOS 123 01/13/2016 0944   AST 24 01/13/2016 0944   ALT 29 01/13/2016 0944   BILITOT 0.71 01/13/2016 0944       RADIOGRAPHIC STUDIES: Ct Chest Wo Contrast  Result Date: 01/02/2016 CLINICAL DATA:  Renal cell carcinoma with metastasis to lung and brain diagnosed in 2014 and 2017. Left nephrectomy. EXAM: CT CHEST WITHOUT CONTRAST TECHNIQUE: Multidetector CT imaging of the chest was performed following the standard protocol without IV contrast. COMPARISON:  09/22/2015 FINDINGS: Cardiovascular: ascending aortic dilatation, including at 5.1 cm  on image 80/series 2. This is similar. No surrounding hemorrhage. Aortic and branch vessel atherosclerosis. Tortuous descending thoracic aorta. Prior median sternotomy. Mild cardiomegaly. No pericardial effusion. Mediastinum/Nodes: No supraclavicular adenopathy. Infiltrative mass/ adenopathy in the AP window region. This measures on the order of 5.8 x 3.6 cm on image 63/ series 2. Compare 5.5 x 3.4 cm on the prior exam. Slight increase in extension more superiorly in the AP window, including on image 53/ series 2 today. Subcarinal adenopathy at 1.9 cm on image 77/series 2. Compare 1.8 cm on the prior exam (when remeasured). Soft tissue fullness about the left infrahilar region is suboptimally evaluated without IV contrast on image 84/series 2. Felt to be similar. Lungs/Pleura: Left-sided pleural thickening or fluid is slightly increased. Mild centrilobular emphysema. Thickening or nodularity along the right major fissure is felt to be similar, including at 4 mm on image 71/series 5. Left apical mild nodularity is not significantly changed. A left upper lobe 5 mm nodule on image 54/series 5 is similar. More inferior left upper lobe 7 mm nodule on image 73/series 5 is unchanged (when remeasured). Spiculated superior segment left lower lobe lung mass measures on the order of 4.8 x 2.9 cm on image 80/series 5. Compare 3.9 x 2.2 cm on the prior exam (when remeasured). Upper Abdomen: Normal imaged portions of the liver, stomach, pancreas, right adrenal gland, and right kidney. Calcification along the splenic capsule may relate to prior trauma. Left nephrectomy. Musculoskeletal: Lower cervical spine fixation. A right-sided T7 sclerotic lesion is likely a bone island. IMPRESSION: 1. Mild progression of thoracic adenopathy, with a dominant infiltrative mass in the AP window. 2. Enlargement of a superior segment left lower lobe lung mass. Smaller pulmonary nodules are similar. 3. Increasing trace left pleural fluid or  thickening. Electronically Signed   By: Abigail Miyamoto M.D.   On: 01/02/2016 16:56    ASSESSMENT AND PLAN: This is a very pleasant 70 years old white male with metastatic renal cell carcinoma currently on treatment with Votrient 800 mg by mouth daily status post 2 months of treatment and he has been tolerating his treatment well except for persistent fatigue. His dose was a change it to Votrient 600 mg by mouth daily since 10/09/2015 and he was tolerating it much better but recently started having more fatigue and diarrhea. His treatment has been on hold recently. The recent CT scan of the chest, abdomen  and pelvis showed evidence for disease progression of the thoracic adenopathy as well as the left lower lobe lung mass. He is currently on treatment with immunotherapy with Nivolumab status post 1 cycle. He is tolerating his treatment well. I recommended for the patient to proceed with cycle #2 today. For the anemia, I will start the patient on Integra +1 capsule by mouth daily. He would come back for follow-up visit in 2 weeks for reevaluation with the start of cycle #3 of his treatment. The patient was advised to call immediately if he has any concerning symptoms in the interval. The patient voices understanding of current disease status and treatment options and is in agreement with the current care plan.  All questions were answered. The patient knows to call the clinic with any problems, questions or concerns. We can certainly see the patient much sooner if necessary.  Disclaimer: This note was dictated with voice recognition software. Similar sounding words can inadvertently be transcribed and may not be corrected upon review.

## 2016-01-27 NOTE — Patient Instructions (Signed)
[facility] Discharge Instructions for Patients Receiving Immunotherapy  Today you received the following agents: Nivolumab. If you develop diarrhea that is not controlled with over the counter Imodium, call the clinic.   If you develop nausea and vomiting that is not controlled by your nausea medication, call the clinic.   BELOW ARE SYMPTOMS THAT SHOULD BE REPORTED IMMEDIATELY:  *FEVER GREATER THAN 100.5 F  *CHILLS WITH OR WITHOUT FEVER  NAUSEA AND VOMITING THAT IS NOT CONTROLLED WITH YOUR NAUSEA MEDICATION  *UNUSUAL SHORTNESS OF BREATH  *UNUSUAL BRUISING OR BLEEDING  TENDERNESS IN MOUTH AND THROAT WITH OR WITHOUT PRESENCE OF ULCERS  *URINARY PROBLEMS  *BOWEL PROBLEMS  UNUSUAL RASH Items with * indicate a potential emergency and should be followed up as soon as possible.  Feel free to call the clinic should you have any questions or concerns. The clinic phone number is [phone].  Please show the [facility] at check-in to the Emergency Department and triage nurse.

## 2016-01-27 NOTE — Telephone Encounter (Signed)
GAVE PATIENT AVS REPORT AND APPOINTMENTS FOR October AND November

## 2016-01-29 ENCOUNTER — Other Ambulatory Visit: Payer: Medicare Other

## 2016-02-05 ENCOUNTER — Ambulatory Visit
Admission: RE | Admit: 2016-02-05 | Discharge: 2016-02-05 | Disposition: A | Discharge status: Home / Self Care | Payer: Medicare Other | Source: Ambulatory Visit | Attending: Radiation Oncology | Admitting: Radiation Oncology

## 2016-02-05 DIAGNOSIS — C7949 Secondary malignant neoplasm of other parts of nervous system: Principal | ICD-10-CM

## 2016-02-05 DIAGNOSIS — C7931 Secondary malignant neoplasm of brain: Secondary | ICD-10-CM

## 2016-02-05 MED ORDER — GADOBENATE DIMEGLUMINE 529 MG/ML IV SOLN
20.0000 mL | Freq: Once | INTRAVENOUS | Status: AC | PRN
  Administered 2016-02-05: 20 mL via INTRAVENOUS

## 2016-02-10 ENCOUNTER — Ambulatory Visit (HOSPITAL_BASED_OUTPATIENT_CLINIC_OR_DEPARTMENT_OTHER): Payer: Medicare Other | Admitting: Internal Medicine

## 2016-02-10 ENCOUNTER — Ambulatory Visit (HOSPITAL_BASED_OUTPATIENT_CLINIC_OR_DEPARTMENT_OTHER): Payer: Medicare Other

## 2016-02-10 ENCOUNTER — Other Ambulatory Visit (HOSPITAL_BASED_OUTPATIENT_CLINIC_OR_DEPARTMENT_OTHER): Payer: Medicare Other

## 2016-02-10 ENCOUNTER — Telehealth: Payer: Self-pay | Admitting: Internal Medicine

## 2016-02-10 ENCOUNTER — Encounter: Payer: Self-pay | Admitting: Internal Medicine

## 2016-02-10 VITALS — BP 130/84 | HR 79 | Temp 97.8°F | Resp 17 | Ht 71.0 in | Wt 236.2 lb

## 2016-02-10 DIAGNOSIS — C7931 Secondary malignant neoplasm of brain: Secondary | ICD-10-CM

## 2016-02-10 DIAGNOSIS — C642 Malignant neoplasm of left kidney, except renal pelvis: Secondary | ICD-10-CM

## 2016-02-10 DIAGNOSIS — Z5112 Encounter for antineoplastic immunotherapy: Secondary | ICD-10-CM

## 2016-02-10 DIAGNOSIS — C7802 Secondary malignant neoplasm of left lung: Secondary | ICD-10-CM

## 2016-02-10 DIAGNOSIS — R5383 Other fatigue: Secondary | ICD-10-CM

## 2016-02-10 DIAGNOSIS — D649 Anemia, unspecified: Secondary | ICD-10-CM | POA: Diagnosis not present

## 2016-02-10 DIAGNOSIS — R197 Diarrhea, unspecified: Secondary | ICD-10-CM

## 2016-02-10 LAB — CBC WITH DIFFERENTIAL/PLATELET
BASO%: 0.6 % (ref 0.0–2.0)
BASOS ABS: 0.1 10*3/uL (ref 0.0–0.1)
EOS ABS: 0.7 10*3/uL — AB (ref 0.0–0.5)
EOS%: 7 % (ref 0.0–7.0)
HEMATOCRIT: 35.5 % — AB (ref 38.4–49.9)
HGB: 11.4 g/dL — ABNORMAL LOW (ref 13.0–17.1)
LYMPH#: 1.7 10*3/uL (ref 0.9–3.3)
LYMPH%: 18.1 % (ref 14.0–49.0)
MCH: 32 pg (ref 27.2–33.4)
MCHC: 32.1 g/dL (ref 32.0–36.0)
MCV: 99.7 fL — AB (ref 79.3–98.0)
MONO#: 0.6 10*3/uL (ref 0.1–0.9)
MONO%: 6.3 % (ref 0.0–14.0)
NEUT%: 68 % (ref 39.0–75.0)
NEUTROS ABS: 6.6 10*3/uL — AB (ref 1.5–6.5)
Platelets: 334 10*3/uL (ref 140–400)
RBC: 3.56 10*6/uL — ABNORMAL LOW (ref 4.20–5.82)
RDW: 13.6 % (ref 11.0–14.6)
WBC: 9.6 10*3/uL (ref 4.0–10.3)

## 2016-02-10 LAB — COMPREHENSIVE METABOLIC PANEL
ALBUMIN: 3.5 g/dL (ref 3.5–5.0)
ALK PHOS: 101 U/L (ref 40–150)
ALT: 9 U/L (ref 0–55)
AST: 16 U/L (ref 5–34)
Anion Gap: 11 mEq/L (ref 3–11)
BILIRUBIN TOTAL: 0.64 mg/dL (ref 0.20–1.20)
BUN: 18 mg/dL (ref 7.0–26.0)
CALCIUM: 10.4 mg/dL (ref 8.4–10.4)
CO2: 23 mEq/L (ref 22–29)
Chloride: 103 mEq/L (ref 98–109)
Creatinine: 1.1 mg/dL (ref 0.7–1.3)
EGFR: 68 mL/min/{1.73_m2} — AB (ref >90)
Glucose: 126 mg/dl (ref 70–140)
POTASSIUM: 4.3 meq/L (ref 3.5–5.1)
Sodium: 138 mEq/L (ref 136–145)
TOTAL PROTEIN: 8.2 g/dL (ref 6.4–8.3)

## 2016-02-10 MED ORDER — SODIUM CHLORIDE 0.9 % IV SOLN
Freq: Once | INTRAVENOUS | Status: AC
  Administered 2016-02-10: 14:00:00 via INTRAVENOUS

## 2016-02-10 MED ORDER — NIVOLUMAB CHEMO INJECTION 100 MG/10ML
240.0000 mg | Freq: Once | INTRAVENOUS | Status: AC
  Administered 2016-02-10: 240 mg via INTRAVENOUS
  Filled 2016-02-10: qty 20

## 2016-02-10 NOTE — Telephone Encounter (Signed)
Avs report and appointment schedule, was given to patient, per 02/10/16 los.

## 2016-02-10 NOTE — Progress Notes (Signed)
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 This was initially diagnosed as a stage III status post left nephrectomy as well as left adrenalectomy on 03/06/2012. 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  PRIOR THERAPY:  1) Stereotactic radiotherapy to the solitary brain lesion scheduled for 08/20/2015. 20  Votrient 800 mg by mouth daily started 08/08/2015. His dose will be reduced to Votrient 600 mg by mouth daily starting 10/09/2015.   CURRENT THERAPY: Nivolumab 240 MG IV every 2 weeks. First dose 01/13/2016. Status post 2 cycles.  INTERVAL HISTORY: Patrick Breed Sr. 70 y.o. male returns to the clinic today for follow-up visit. The patient has no complaints today. He tolerated the second cycle of his treatment with Nivolumab fairly well with no significant adverse effects.  He denied having any significant chest pain but continues to have shortness of breath with exertion with no cough or hemoptysis. He has no fever or chills. He has no nausea or vomiting. The patient denied having any significant weight loss or night sweats. He is here today to start cycle #3 of his treatment.  MEDICAL HISTORY: Past Medical History:  Diagnosis Date  . AAA (abdominal aortic aneurysm) ([place])   . Anxiety   . Asthma   . Cancer [facility])    Renal Cell- 2013; Lung 2017  . Chronic kidney disease    Renal Cell Adenocarcinoma  . Chronic kidney disease    Left Nephrectomy  . COPD (chronic obstructive pulmonary disease) ([place])   . Coronary artery disease   . Dehydration 12/16/2015  . Depression   . Diabetes mellitus without  complication ([place])    Type II  . Eczema   . Encounter for antineoplastic chemotherapy 08/19/2015  . Encounter for antineoplastic immunotherapy 01/06/2016  . GERD (gastroesophageal reflux disease)   . Headache   . Heart murmur   . HPTH (hyperparathyroidism) ([place])   . Hx of radiation therapy 08/20/15   Right Temporal brain  . Hyperlipidemia   . Hyperparathyroidism ([place])   . Hypertension   . Hypertension 08/19/2015  . Hypothyroidism   . Malignant neoplasm of left kidney ([place]) 02/09/2013  . Neuropathy ([place])   . Shortness of breath dyspnea   . Sleep apnea    CPAP  . Thoracic aortic aneurysm ([place]) 2017    ALLERGIES:  is allergic to amoxicillin; codeine; penicillins; and ace inhibitors.  MEDICATIONS:  Current Outpatient Prescriptions  Medication Sig Dispense Refill  . ADVAIR DISKUS 100-50 MCG/DOSE AEPB INHALE 1 PUFF TWICE A DAY 60 each 12  . albuterol (PROVENTIL HFA;VENTOLIN HFA) 108 (90 BASE) MCG/ACT inhaler Inhale 1 puff into the lungs every 6 (six) hours as needed for wheezing.     Marland Kitchen ALPRAZolam (XANAX) 0.5 MG tablet TAKE ONE TABLET BY MOUTH TWICE DAILY AS NEEDED (Patient taking differently: TAKE ONE TABLET BY MOUTH TWICE DAILY AS NEEDED FOR ANXIETY) 60 tablet 5  . amLODipine (NORVASC) 10 MG tablet TAKE ONE TABLET BY MOUTH EVERY DAY 30 tablet 11  . aspirin 81 MG tablet Take 81 mg by mouth at bedtime. Reported on 07/01/2015    . calcium carbonate (OS-CAL - DOSED IN  MG OF ELEMENTAL CALCIUM) 1250 (500 Ca) MG tablet Take 1 tablet (500 mg of elemental calcium total) by mouth 2 (two) times daily with a meal.    . Cholecalciferol 1000 UNITS tablet Take 1,000 Units by mouth at bedtime.     . FeFum-FePoly-FA-B Cmp-C-Biot (INTEGRA PLUS) CAPS Take 1 capsule by mouth every morning. 30 capsule 2  . FLUoxetine (PROZAC) 40 MG capsule TAKE ONE CAPSULE BY MOUTH EVERY DAY (Patient taking differently: TAKE 40 MG BY MOUTH EVERY DAY) 30 capsule 12  . loperamide (IMODIUM) 2 MG capsule Take 2 mg by mouth 2 (two)  times daily as needed for diarrhea or loose stools.     . meclizine (ANTIVERT) 25 MG tablet Take 25 mg by mouth daily as needed for dizziness.     . mometasone (ELOCON) 0.1 % cream Apply 1 application topically daily. Use daily as directed for up 2 weeks out of the month (Patient taking differently: Apply 1 application topically daily as needed (burning). ) 45 g 2  . ONE TOUCH ULTRA TEST test strip TEST TWICE DAILY 100 each 1  . ONETOUCH DELICA LANCETS 85I MISC TEST TWICE DAILY 100 each 5  . prochlorperazine (COMPAZINE) 10 MG tablet   0   No current facility-administered medications for this visit.     SURGICAL HISTORY:  Past Surgical History:  Procedure Laterality Date  . BACK SURGERY     compressed vertebrae-put in a plate to seprate the spaces  . CARDIAC CATHETERIZATION    . CERVICAL FUSION    . CORONARY ARTERY BYPASS GRAFT     x 5  . ENDOBRONCHIAL ULTRASOUND N/A 06/30/2015   Procedure: ENDOHIAL ULTRASOUND;  Surgeon: Flora Lipps, MD;  Location: [facility] ORS;  Service: Cardiopulmonary;  Laterality: N/A;  . FRACTURE SURGERY     left arm repair  . KNEE SURGERY     left knee repair  . PARATHYROIDECTOMY  12/17/09  . VIDEO BRONCHOSCOPY WITH ENDOBRONCHIAL ULTRASOUND N/A 07/09/2015   Procedure: VIDEO BRONCHOSCOPY WITH ENDOBRONCHIAL ULTRASOUND;  Surgeon: Ivin Poot, MD;  Location: [facility] OR;  Service: Thoracic;  Laterality: N/A;    REVIEW OF SYSTEMS:  A comprehensive review of systems was negative.   PHYSICAL EXAMINATION: General appearance: alert, cooperative, fatigued and no distress Head: Normocephalic, without obvious abnormality, atraumatic Neck: no adenopathy, no JVD, supple, symmetrical, trachea midline and thyroid not enlarged, symmetric, no tenderness/mass/nodules Lymph nodes: Cervical, supraclavicular, and axillary nodes normal. Resp: clear to auscultation bilaterally Back: symmetric, no curvature. ROM normal. No CVA tenderness. Cardio: regular rate and rhythm, S1, S2 normal, no  murmur, click, rub or gallop GI: soft, non-tender; bowel sounds normal; no masses,  no organomegaly Extremities: extremities normal, atraumatic, no cyanosis or edema Neurologic: Alert and oriented X 3, normal strength and tone. Normal symmetric reflexes. Normal coordination and gait  ECOG PERFORMANCE STATUS: 1 - Symptomatic but completely ambulatory  Blood pressure 130/84, pulse 79, temperature 97.8 F (36.6 C), temperature source Oral, resp. rate 17, height '5\' 11"'$  (1.803 m), weight 236 lb 3.2 oz (107.1 kg), SpO2 100 %.  LABORATORY DATA: Lab Results  Component Value Date   WBC 9.6 02/10/2016   HGB 11.4 (L) 02/10/2016   HCT 35.5 (L) 02/10/2016   MCV 99.7 (H) 02/10/2016   PLT 334 02/10/2016      Chemistry      Component Value Date/Time   NA 138 02/10/2016 1113   K 4.3 02/10/2016 1113   CL 101 12/30/2015 1640   CL 103 01/19/2012 0834  CO2 23 02/10/2016 1113   BUN 18.0 02/10/2016 1113   CREATININE 1.1 02/10/2016 1113   GLU 159 11/21/2013      Component Value Date/Time   CALCIUM 10.4 02/10/2016 1113   ALKPHOS 101 02/10/2016 1113   AST 16 02/10/2016 1113   ALT 9 02/10/2016 1113   BILITOT 0.64 02/10/2016 1113       RADIOGRAPHIC STUDIES: Mr Jeri Cos DX Contrast  Result Date: 02/05/2016 CLINICAL DATA:  SRS staging protocol, follow-up previous lesions. EXAM: MRI HEAD WITHOUT AND WITH CONTRAST TECHNIQUE: Multiplanar, multiecho pulse sequences of the brain and surrounding structures were obtained without and with intravenous contrast. CONTRAST:  79m MULTIHANCE GADOBENATE DIMEGLUMINE 529 MG/ML IV SOLN COMPARISON:  The prior brain MRIs from 11/03/2015 as well as 08/15/2015. FINDINGS: Brain: Generalized cerebral atrophy is stable from prior. Patchy T2/FLAIR hyperintensity within the periventricular, deep, and subcortical white matter both cerebral hemispheres again noted, likely related to moderate chronic microvascular ischemic changes. Scattered remote lacunar infarcts present  within the bilateral basal ganglia and thalami. Additional scattered remote lacunar infarcts within the bilateral cerebellar hemispheres. There is a punctate focus of diffusion abnormality within the cortical gray matter of the left frontal operculum (series 4, image 60). Associated FLAIR signal abnormality (series 8, image 15). No appreciable enhancement with his focus. Finding favored to reflect a tiny cortical acute versus subacute ischemic infarct. No other evidence for acute or subacute ischemia. Gray-white matter differentiation otherwise maintained. No evidence for acute intracranial hemorrhage. Small amount of chronic hemosiderin staining overlies the right parietal convexity. 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 Overall, this lesion is not significantly changed in size relative to prior exam, measuring 4 x 4 x 5 mm (series 11, image 73). Additional nodular focus of enhancement within the mesial right temporal lobe described on previous study is no longer seen. No other new lesions or abnormal enhancement identified. No midline shift or mass effect. No hydrocephalus. No extra-axial fluid collection. Major dural sinuses are patent. The the the Vascular: Major intracranial vascular flow voids are maintained. Skull and upper cervical spine: Craniocervical junction normal. Degenerative thickening noted at the tectorial membrane. Visualized upper cervical spine otherwise unremarkable. Bone marrow signal intensity within normal limits. Scalp soft tissues unremarkable. Sinuses/Orbits: Globes and orbital soft tissues within normal limits. Paranasal sinuses are clear. No mastoid effusion. Inner ear structures normal. Other: No other significant finding. IMPRESSION: 1. No significant interval change in size and appearance of periventricular/subependymal lesion at the temporal horn of the right lateral ventricle, measuring 4 x  4 x 5 mm on today's study (previously 4 x 4 x 5 mm). 2. Previously noted 1 x 2 mm focus of enhancement within the adjacent mesial right temporal lobe no longer seen. 3. No other new lesions identified. 4. Punctate focus of diffusion abnormality within the cortex of the left frontal lobe, suspicious for a tiny acute/early subacute ischemic infarct. Electronically Signed   By: BJeannine BogaM.D.   On: 02/05/2016 15:04    ASSESSMENT AND PLAN: This is a very pleasant 70years old white male with metastatic renal cell carcinoma currently on treatment with Votrient 800 mg by mouth daily status post 2 months of treatment and he has been tolerating his treatment well except for persistent fatigue. His dose was a change it to Votrient 600 mg by mouth daily since 10/09/2015 and he was tolerating it much better but recently started having more fatigue and diarrhea. His  treatment has been on hold recently. The recent CT scan of the chest, abdomen and pelvis showed evidence for disease progression of the thoracic adenopathy as well as the left lower lobe lung mass. He is currently on treatment with immunotherapy with Nivolumab status post 2 cycles. He is tolerating his treatment well. I recommended for the patient to proceed with cycle #3 today. For the anemia, I will start the patient on Integra +1 capsule by mouth daily. He would come back for follow-up visit in 2 weeks for reevaluation with the start of cycle #4 of his treatment. The patient was advised to call immediately if he has any concerning symptoms in the interval. The patient voices understanding of current disease status and treatment options and is in agreement with the current care plan.  All questions were answered. The patient knows to call the clinic with any problems, questions or concerns. We can certainly see the patient much sooner if necessary.  Disclaimer: This note was dictated with voice recognition software. Similar sounding words  can inadvertently be transcribed and may not be corrected upon review.

## 2016-02-10 NOTE — Patient Instructions (Signed)
[facility] Discharge Instructions for Patients Receiving Immunotherapy  Today you received the following agents: Nivolumab. If you develop diarrhea that is not controlled with over the counter Imodium, call the clinic.   If you develop nausea and vomiting that is not controlled by your nausea medication, call the clinic.   BELOW ARE SYMPTOMS THAT SHOULD BE REPORTED IMMEDIATELY:  *FEVER GREATER THAN 100.5 F  *CHILLS WITH OR WITHOUT FEVER  NAUSEA AND VOMITING THAT IS NOT CONTROLLED WITH YOUR NAUSEA MEDICATION  *UNUSUAL SHORTNESS OF BREATH  *UNUSUAL BRUISING OR BLEEDING  TENDERNESS IN MOUTH AND THROAT WITH OR WITHOUT PRESENCE OF ULCERS  *URINARY PROBLEMS  *BOWEL PROBLEMS  UNUSUAL RASH Items with * indicate a potential emergency and should be followed up as soon as possible.  Feel free to call the clinic should you have any questions or concerns. The clinic phone number is [phone].  Please show the [facility] at check-in to the Emergency Department and triage nurse.

## 2016-02-23 ENCOUNTER — Other Ambulatory Visit: Payer: Self-pay | Admitting: Medical Oncology

## 2016-02-23 ENCOUNTER — Telehealth: Payer: Self-pay | Admitting: Medical Oncology

## 2016-02-23 NOTE — Telephone Encounter (Signed)
Pt got a bill for nivolumab concerned about paying it . appt with financial counselors tomorrow.

## 2016-02-24 ENCOUNTER — Ambulatory Visit (HOSPITAL_BASED_OUTPATIENT_CLINIC_OR_DEPARTMENT_OTHER): Payer: Medicare Other | Admitting: Internal Medicine

## 2016-02-24 ENCOUNTER — Ambulatory Visit (HOSPITAL_BASED_OUTPATIENT_CLINIC_OR_DEPARTMENT_OTHER): Payer: Medicare Other

## 2016-02-24 ENCOUNTER — Encounter: Payer: Self-pay | Admitting: Internal Medicine

## 2016-02-24 ENCOUNTER — Other Ambulatory Visit (HOSPITAL_BASED_OUTPATIENT_CLINIC_OR_DEPARTMENT_OTHER): Payer: Medicare Other

## 2016-02-24 ENCOUNTER — Telehealth: Payer: Self-pay | Admitting: Internal Medicine

## 2016-02-24 VITALS — BP 136/64 | HR 81 | Temp 97.6°F | Resp 16 | Ht 71.0 in | Wt 239.2 lb

## 2016-02-24 DIAGNOSIS — Z5112 Encounter for antineoplastic immunotherapy: Secondary | ICD-10-CM | POA: Diagnosis not present

## 2016-02-24 DIAGNOSIS — D649 Anemia, unspecified: Secondary | ICD-10-CM | POA: Diagnosis not present

## 2016-02-24 DIAGNOSIS — C642 Malignant neoplasm of left kidney, except renal pelvis: Secondary | ICD-10-CM

## 2016-02-24 DIAGNOSIS — C7802 Secondary malignant neoplasm of left lung: Secondary | ICD-10-CM

## 2016-02-24 DIAGNOSIS — R197 Diarrhea, unspecified: Secondary | ICD-10-CM

## 2016-02-24 DIAGNOSIS — C7931 Secondary malignant neoplasm of brain: Secondary | ICD-10-CM | POA: Diagnosis not present

## 2016-02-24 DIAGNOSIS — R5383 Other fatigue: Secondary | ICD-10-CM

## 2016-02-24 LAB — COMPREHENSIVE METABOLIC PANEL
ALBUMIN: 3.2 g/dL — AB (ref 3.5–5.0)
ALK PHOS: 89 U/L (ref 40–150)
ALT: 10 U/L (ref 0–55)
AST: 13 U/L (ref 5–34)
Anion Gap: 11 mEq/L (ref 3–11)
BILIRUBIN TOTAL: 0.45 mg/dL (ref 0.20–1.20)
BUN: 18.8 mg/dL (ref 7.0–26.0)
CALCIUM: 9.7 mg/dL (ref 8.4–10.4)
CO2: 24 mEq/L (ref 22–29)
CREATININE: 1.1 mg/dL (ref 0.7–1.3)
Chloride: 105 mEq/L (ref 98–109)
EGFR: 71 mL/min/{1.73_m2} — ABNORMAL LOW (ref >90)
GLUCOSE: 130 mg/dL (ref 70–140)
POTASSIUM: 4 meq/L (ref 3.5–5.1)
SODIUM: 140 meq/L (ref 136–145)
TOTAL PROTEIN: 7.4 g/dL (ref 6.4–8.3)

## 2016-02-24 LAB — CBC WITH DIFFERENTIAL/PLATELET
BASO%: 0.6 % (ref 0.0–2.0)
BASOS ABS: 0.1 10*3/uL (ref 0.0–0.1)
EOS ABS: 0.8 10*3/uL — AB (ref 0.0–0.5)
EOS%: 8.1 % — AB (ref 0.0–7.0)
HCT: 30.9 % — ABNORMAL LOW (ref 38.4–49.9)
HGB: 10.2 g/dL — ABNORMAL LOW (ref 13.0–17.1)
LYMPH%: 16.2 % (ref 14.0–49.0)
MCH: 31.4 pg (ref 27.2–33.4)
MCHC: 32.8 g/dL (ref 32.0–36.0)
MCV: 95.8 fL (ref 79.3–98.0)
MONO#: 0.6 10*3/uL (ref 0.1–0.9)
MONO%: 6.3 % (ref 0.0–14.0)
NEUT%: 68.8 % (ref 39.0–75.0)
NEUTROS ABS: 6.8 10*3/uL — AB (ref 1.5–6.5)
Platelets: 334 10*3/uL (ref 140–400)
RBC: 3.23 10*6/uL — AB (ref 4.20–5.82)
RDW: 13.7 % (ref 11.0–14.6)
WBC: 9.8 10*3/uL (ref 4.0–10.3)
lymph#: 1.6 10*3/uL (ref 0.9–3.3)

## 2016-02-24 MED ORDER — SODIUM CHLORIDE 0.9 % IV SOLN
Freq: Once | INTRAVENOUS | Status: AC
  Administered 2016-02-24: 13:00:00 via INTRAVENOUS

## 2016-02-24 MED ORDER — NIVOLUMAB CHEMO INJECTION 100 MG/10ML
240.0000 mg | Freq: Once | INTRAVENOUS | Status: AC
  Administered 2016-02-24: 240 mg via INTRAVENOUS
  Filled 2016-02-24: qty 4

## 2016-02-24 NOTE — Progress Notes (Signed)
[place] [phone]   Fax:([phone]) [place], MD 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 This was initially diagnosed as a stage III status post left nephrectomy as well as left adrenalectomy on 03/06/2012. 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  PRIOR THERAPY:  1) Stereotactic radiotherapy to the solitary brain lesion scheduled for 08/20/2015. 20  Votrient 800 mg by mouth daily started 08/08/2015. His dose will be reduced to Votrient 600 mg by mouth daily starting 10/09/2015.   CURRENT THERAPY: Nivolumab 240 MG IV every 2 weeks. First dose 01/13/2016. Status post 3 cycles.  INTERVAL HISTORY: [name]. 70 y.o. male returns to the clinic today for follow-up visit. The patient has no complaints today. He tolerated the second cycle of his treatment with Nivolumab fairly well with no significant adverse effects.  He denied having any significant chest pain but continues to have shortness of breath with exertion with mild cough with no hemoptysis. He has no fever or chills. He has no nausea or vomiting. The patient denied having any significant weight loss or night sweats. He is here today to start cycle #4 of his treatment.  MEDICAL HISTORY: Past Medical History:  Diagnosis Date  . AAA (abdominal aortic aneurysm) ([place])   . Anxiety   . Asthma   . Cancer [facility])    Renal Cell- 2013; Lung 2017  . Chronic kidney disease    Renal Cell Adenocarcinoma  . Chronic kidney disease    Left Nephrectomy  . COPD (chronic obstructive pulmonary disease) ([place])   . Coronary artery disease   . Dehydration 12/16/2015  . Depression   . Diabetes mellitus  without complication ([place])    Type II  . Eczema   . Encounter for antineoplastic chemotherapy 08/19/2015  . Encounter for antineoplastic immunotherapy 01/06/2016  . GERD (gastroesophageal reflux disease)   . Headache   . Heart murmur   . HPTH (hyperparathyroidism) ([place])   . Hx of radiation therapy 08/20/15   Right Temporal brain  . Hyperlipidemia   . Hyperparathyroidism ([place])   . Hypertension   . Hypertension 08/19/2015  . Hypothyroidism   . Malignant neoplasm of left kidney ([place]) 02/09/2013  . Neuropathy ([place])   . Shortness of breath dyspnea   . Sleep apnea    CPAP  . Thoracic aortic aneurysm ([place]) 2017    ALLERGIES:  is allergic to amoxicillin; codeine; penicillins; and ace inhibitors.  MEDICATIONS:  Current Outpatient Prescriptions  Medication Sig Dispense Refill  . ADVAIR DISKUS 100-50 MCG/DOSE AEPB INHALE 1 PUFF TWICE A DAY 60 each 12  . albuterol (PROVENTIL HFA;VENTOLIN HFA) 108 (90 BASE) MCG/ACT inhaler Inhale 1 puff into the lungs every 6 (six) hours as needed for wheezing.     Marland Kitchen ALPRAZolam (XANAX) 0.5 MG tablet TAKE ONE TABLET BY MOUTH TWICE DAILY AS NEEDED (Patient taking differently: TAKE ONE TABLET BY MOUTH TWICE DAILY AS NEEDED FOR ANXIETY) 60 tablet 5  . amLODipine (NORVASC) 10 MG tablet TAKE ONE TABLET BY MOUTH EVERY DAY 30 tablet 11  . aspirin 81 MG tablet Take 81 mg by mouth at bedtime. Reported on 07/01/2015    . calcium carbonate (OS-CAL - DOSED  IN MG OF ELEMENTAL CALCIUM) 1250 (500 Ca) MG tablet Take 1 tablet (500 mg of elemental calcium total) by mouth 2 (two) times daily with a meal.    . Cholecalciferol 1000 UNITS tablet Take 1,000 Units by mouth at bedtime.     . FeFum-FePoly-FA-B Cmp-C-Biot (INTEGRA PLUS) CAPS Take 1 capsule by mouth every morning. 30 capsule 2  . FLUoxetine (PROZAC) 40 MG capsule TAKE ONE CAPSULE BY MOUTH EVERY DAY (Patient taking differently: TAKE 40 MG BY MOUTH EVERY DAY) 30 capsule 12  . meclizine (ANTIVERT) 25 MG tablet Take 25 mg by  mouth daily as needed for dizziness.     . mometasone (ELOCON) 0.1 % cream Apply 1 application topically daily. Use daily as directed for up 2 weeks out of the month (Patient taking differently: Apply 1 application topically daily as needed (burning). ) 45 g 2  . ONE TOUCH ULTRA TEST test strip TEST TWICE DAILY 100 each 1  . ONETOUCH DELICA LANCETS 15V MISC TEST TWICE DAILY 100 each 5  . loperamide (IMODIUM) 2 MG capsule Take 2 mg by mouth 2 (two) times daily as needed for diarrhea or loose stools.     . prochlorperazine (COMPAZINE) 10 MG tablet   0   No current facility-administered medications for this visit.     SURGICAL HISTORY:  Past Surgical History:  Procedure Laterality Date  . BACK SURGERY     compressed vertebrae-put in a plate to seprate the spaces  . CARDIAC CATHETERIZATION    . CERVICAL FUSION    . CORONARY ARTERY BYPASS GRAFT     x 5  . ENDOBRONCHIAL ULTRASOUND N/A 06/30/2015   Procedure: ENDOHIAL ULTRASOUND;  Surgeon: Flora Lipps, MD;  Location: [facility] ORS;  Service: Cardiopulmonary;  Laterality: N/A;  . FRACTURE SURGERY     left arm repair  . KNEE SURGERY     left knee repair  . PARATHYROIDECTOMY  12/17/09  . VIDEO BRONCHOSCOPY WITH ENDOBRONCHIAL ULTRASOUND N/A 07/09/2015   Procedure: VIDEO BRONCHOSCOPY WITH ENDOBRONCHIAL ULTRASOUND;  Surgeon: Ivin Poot, MD;  Location: [facility] OR;  Service: Thoracic;  Laterality: N/A;    REVIEW OF SYSTEMS:  A comprehensive review of systems was negative except for: Respiratory: positive for cough and dyspnea on exertion   PHYSICAL EXAMINATION: General appearance: alert, cooperative, fatigued and no distress Head: Normocephalic, without obvious abnormality, atraumatic Neck: no adenopathy, no JVD, supple, symmetrical, trachea midline and thyroid not enlarged, symmetric, no tenderness/mass/nodules Lymph nodes: Cervical, supraclavicular, and axillary nodes normal. Resp: clear to auscultation bilaterally Back: symmetric, no curvature. ROM  normal. No CVA tenderness. Cardio: regular rate and rhythm, S1, S2 normal, no murmur, click, rub or gallop GI: soft, non-tender; bowel sounds normal; no masses,  no organomegaly Extremities: extremities normal, atraumatic, no cyanosis or edema Neurologic: Alert and oriented X 3, normal strength and tone. Normal symmetric reflexes. Normal coordination and gait  ECOG PERFORMANCE STATUS: 1 - Symptomatic but completely ambulatory  Blood pressure 136/64, pulse 81, temperature 97.6 F (36.4 C), temperature source Oral, resp. rate 16, height '5\' 11"'$  (1.803 m), weight 239 lb 3.2 oz (108.5 kg), SpO2 99 %.  LABORATORY DATA: Lab Results  Component Value Date   WBC 9.8 02/24/2016   HGB 10.2 (L) 02/24/2016   HCT 30.9 (L) 02/24/2016   MCV 95.8 02/24/2016   PLT 334 02/24/2016      Chemistry      Component Value Date/Time   NA 140 02/24/2016 1020   K 4.0 02/24/2016 1020  CL 101 12/30/2015 1640   CL 103 01/19/2012 0834   CO2 24 02/24/2016 1020   BUN 18.8 02/24/2016 1020   CREATININE 1.1 02/24/2016 1020   GLU 159 11/21/2013      Component Value Date/Time   CALCIUM 9.7 02/24/2016 1020   ALKPHOS 89 02/24/2016 1020   AST 13 02/24/2016 1020   ALT 10 02/24/2016 1020   BILITOT 0.45 02/24/2016 1020       RADIOGRAPHIC STUDIES: Mr Jeri Cos TF Contrast  Result Date: 02/05/2016 CLINICAL DATA:  SRS staging protocol, follow-up previous lesions. EXAM: MRI HEAD WITHOUT AND WITH CONTRAST TECHNIQUE: Multiplanar, multiecho pulse sequences of the brain and surrounding structures were obtained without and with intravenous contrast. CONTRAST:  20m MULTIHANCE GADOBENATE DIMEGLUMINE 529 MG/ML IV SOLN COMPARISON:  The prior brain MRIs from 11/03/2015 as well as 08/15/2015. FINDINGS: Brain: Generalized cerebral atrophy is stable from prior. Patchy T2/FLAIR hyperintensity within the periventricular, deep, and subcortical white matter both cerebral hemispheres again noted, likely related to moderate chronic  microvascular ischemic changes. Scattered remote lacunar infarcts present within the bilateral basal ganglia and thalami. Additional scattered remote lacunar infarcts within the bilateral cerebellar hemispheres. There is a punctate focus of diffusion abnormality within the cortical gray matter of the left frontal operculum (series 4, image 60). Associated FLAIR signal abnormality (series 8, image 15). No appreciable enhancement with his focus. Finding favored to reflect a tiny cortical acute versus subacute ischemic infarct. No other evidence for acute or subacute ischemia. Gray-white matter differentiation otherwise maintained. No evidence for acute intracranial hemorrhage. Small amount of chronic hemosiderin staining overlies the right parietal convexity. 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 Overall, this lesion is not significantly changed in size relative to prior exam, measuring 4 x 4 x 5 mm (series 11, image 73). Additional nodular focus of enhancement within the mesial right temporal lobe described on previous study is no longer seen. No other new lesions or abnormal enhancement identified. No midline shift or mass effect. No hydrocephalus. No extra-axial fluid collection. Major dural sinuses are patent. The the the Vascular: Major intracranial vascular flow voids are maintained. Skull and upper cervical spine: Craniocervical junction normal. Degenerative thickening noted at the tectorial membrane. Visualized upper cervical spine otherwise unremarkable. Bone marrow signal intensity within normal limits. Scalp soft tissues unremarkable. Sinuses/Orbits: Globes and orbital soft tissues within normal limits. Paranasal sinuses are clear. No mastoid effusion. Inner ear structures normal. Other: No other significant finding. IMPRESSION: 1. No significant interval change in size and appearance of periventricular/subependymal  lesion at the temporal horn of the right lateral ventricle, measuring 4 x 4 x 5 mm on today's study (previously 4 x 4 x 5 mm). 2. Previously noted 1 x 2 mm focus of enhancement within the adjacent mesial right temporal lobe no longer seen. 3. No other new lesions identified. 4. Punctate focus of diffusion abnormality within the cortex of the left frontal lobe, suspicious for a tiny acute/early subacute ischemic infarct. Electronically Signed   By: BJeannine BogaM.D.   On: 02/05/2016 15:04    ASSESSMENT AND PLAN: This is a very pleasant 70years old white male with metastatic renal cell carcinoma currently on treatment with Votrient 800 mg by mouth daily status post 2 months of treatment and he has been tolerating his treatment well except for persistent fatigue. His dose was a change it to Votrient 600 mg by mouth daily since 10/09/2015 and he was tolerating  it much better but recently started having more fatigue and diarrhea. His treatment has been on hold recently. The recent CT scan of the chest, abdomen and pelvis showed evidence for disease progression of the thoracic adenopathy as well as the left lower lobe lung mass. He is currently on treatment with immunotherapy with Nivolumab status post 3 cycles. He is tolerating his treatment well. I recommended for the patient to proceed with cycle #4 today. For the anemia, I will start the patient on Integra +1 capsule by mouth daily. He would come back for follow-up visit in 2 weeks for reevaluation with the start of cycle #5 of his treatment after repeating CT scan of the chest, abdomen and pelvis for restaging of his disease. The patient was advised to call immediately if he has any concerning symptoms in the interval. The patient voices understanding of current disease status and treatment options and is in agreement with the current care plan.  All questions were answered. The patient knows to call the clinic with any problems, questions or  concerns. We can certainly see the patient much sooner if necessary.  Disclaimer: This note was dictated with voice recognition software. Similar sounding words can inadvertently be transcribed and may not be corrected upon review.

## 2016-02-24 NOTE — Patient Instructions (Signed)
[facility] Discharge Instructions for Patients Receiving Chemotherapy  Today you received the following chemotherapy agents:  Nivolumab.  To help prevent nausea and vomiting after your treatment, we encourage you to take your nausea medication as directed.   If you develop nausea and vomiting that is not controlled by your nausea medication, call the clinic.   BELOW ARE SYMPTOMS THAT SHOULD BE REPORTED IMMEDIATELY:  *FEVER GREATER THAN 100.5 F  *CHILLS WITH OR WITHOUT FEVER  NAUSEA AND VOMITING THAT IS NOT CONTROLLED WITH YOUR NAUSEA MEDICATION  *UNUSUAL SHORTNESS OF BREATH  *UNUSUAL BRUISING OR BLEEDING  TENDERNESS IN MOUTH AND THROAT WITH OR WITHOUT PRESENCE OF ULCERS  *URINARY PROBLEMS  *BOWEL PROBLEMS  UNUSUAL RASH Items with * indicate a potential emergency and should be followed up as soon as possible.  Feel free to call the clinic you have any questions or concerns. The clinic phone number is [phone].  Please show the CHEMO ALERT CARD at check-in to the Emergency Department and triage nurse.   

## 2016-02-24 NOTE — Progress Notes (Signed)
Patient came in stating he had an appointment with me today concerning a bill he has. I reviewed his appointment desk and there was an appointment scheduled with me this morning that I was unaware of. Advised patient I would do some research and get back with him advising of what information I may need from him. Advised Darlena of the appointment being scheduled without my knowledge and printed for her to follow u p. Patient has my card for any additional financial questions or concerns.

## 2016-02-24 NOTE — Telephone Encounter (Signed)
Appointments scheduled per 10/31 LOS. Patient left before he was given contrast, AVS report, and calendars with future scheduled appointments. Patient will pick up print outs in infusion.

## 2016-02-24 NOTE — Progress Notes (Signed)
Reviewed prior notes form Gerald Stabs in pharmacy whom enrolled patient in [place] for [place]. Called [employer] and provided them wtih information from Chris's notes and inquired about the balance and if Obdivo could be added. Spoke with Lin Givens who provided me with the balance of the patient's grant and added Obdivo to his medication list. Called back and spoke with Melissa to have a copy of the award letter faxed to me. Received by fax and emailed a copy and screenshot to [facility] in billing and asked that his self-pay balance be sent to the [facility] for payment. Went to lobby to find the patient and share the good news. He was very thankful. Advised him once billing has been corrected, he should receive a new bill with the correct balance. Placed award letter in my copay book.

## 2016-02-25 ENCOUNTER — Telehealth: Payer: Self-pay | Admitting: *Deleted

## 2016-02-25 NOTE — Telephone Encounter (Signed)
Per LOS I have scheduled appts and notified scheduler 

## 2016-03-01 ENCOUNTER — Encounter: Payer: Self-pay | Admitting: Family Medicine

## 2016-03-01 ENCOUNTER — Ambulatory Visit (INDEPENDENT_AMBULATORY_CARE_PROVIDER_SITE_OTHER): Payer: Medicare Other | Admitting: Family Medicine

## 2016-03-01 VITALS — BP 108/66 | HR 94 | Temp 97.8°F | Resp 20 | Wt 238.0 lb

## 2016-03-01 DIAGNOSIS — B9789 Other viral agents as the cause of diseases classified elsewhere: Secondary | ICD-10-CM

## 2016-03-01 DIAGNOSIS — J069 Acute upper respiratory infection, unspecified: Secondary | ICD-10-CM

## 2016-03-01 MED ORDER — DOXYCYCLINE HYCLATE 100 MG PO TABS: 100.0000 mg | ORAL_TABLET | Freq: Two times a day (BID) | ORAL | 0 refills | Status: DC

## 2016-03-01 NOTE — Patient Instructions (Addendum)
Schedule inhalers while ill. Take Delsym for cough and Mucinex D for congestion. If not improving over the course of the week start the antibiotic.

## 2016-03-01 NOTE — Progress Notes (Signed)
Subjective:     Patient ID: Patrick Salt., male   DOB: 11-Nov-1945, 70 y.o.   MRN: [id number]  HPI  Chief Complaint  Patient presents with  . Cough    x 2-3 days. Dry cough. Denies chest pain. States he has had low grade fever of 99. Is also c/o wheezing. Denies headache, sinus pressure, otalgia.  Reports onset of sore throat and sinus congestion prior to the cough. Currently not using inhalers. Had last chemrx on 10/31 for metastatic renal carcinoma.   Review of Systems     Objective:   Physical Exam  Constitutional: He appears well-developed and well-nourished. No distress.  Ears: T.M's intact without inflammation Throat: no tonsillar enlargement or exudate Neck: no cervical adenopathy Lungs: clear     Assessment:    1. Viral upper respiratory tract infection - doxycycline (VIBRA-TABS) 100 MG tablet; Take 1 tablet (100 mg total) by mouth 2 (two) times daily.  Dispense: 20 tablet; Refill: 0    Plan:    Discussed resuming inhalers while ill and adding otc medications. Will stat abx if not improving over the course of the week.

## 2016-03-02 ENCOUNTER — Observation Stay (HOSPITAL_COMMUNITY)
Admission: EM | Admit: 2016-03-02 | Discharge: 2016-03-04 | Disposition: A | Discharge status: Home / Self Care | Payer: Medicare Other | Attending: Family Medicine | Admitting: Family Medicine

## 2016-03-02 ENCOUNTER — Ambulatory Visit (HOSPITAL_BASED_OUTPATIENT_CLINIC_OR_DEPARTMENT_OTHER): Payer: Medicare Other | Admitting: Nurse Practitioner

## 2016-03-02 ENCOUNTER — Emergency Department (HOSPITAL_COMMUNITY): Payer: Medicare Other

## 2016-03-02 ENCOUNTER — Encounter (HOSPITAL_COMMUNITY): Payer: Self-pay

## 2016-03-02 ENCOUNTER — Other Ambulatory Visit: Payer: Self-pay | Admitting: *Deleted

## 2016-03-02 ENCOUNTER — Encounter: Payer: Self-pay | Admitting: Nurse Practitioner

## 2016-03-02 ENCOUNTER — Encounter (HOSPITAL_COMMUNITY): Payer: Self-pay | Admitting: Internal Medicine

## 2016-03-02 ENCOUNTER — Telehealth: Payer: Self-pay | Admitting: *Deleted

## 2016-03-02 ENCOUNTER — Other Ambulatory Visit (HOSPITAL_BASED_OUTPATIENT_CLINIC_OR_DEPARTMENT_OTHER): Payer: Medicare Other

## 2016-03-02 VITALS — BP 133/65 | HR 78 | Temp 98.0°F | Resp 18 | Ht 71.0 in | Wt 235.4 lb

## 2016-03-02 DIAGNOSIS — E1122 Type 2 diabetes mellitus with diabetic chronic kidney disease: Secondary | ICD-10-CM | POA: Insufficient documentation

## 2016-03-02 DIAGNOSIS — F329 Major depressive disorder, single episode, unspecified: Secondary | ICD-10-CM | POA: Insufficient documentation

## 2016-03-02 DIAGNOSIS — C7800 Secondary malignant neoplasm of unspecified lung: Secondary | ICD-10-CM | POA: Diagnosis not present

## 2016-03-02 DIAGNOSIS — Z951 Presence of aortocoronary bypass graft: Secondary | ICD-10-CM | POA: Insufficient documentation

## 2016-03-02 DIAGNOSIS — N189 Chronic kidney disease, unspecified: Secondary | ICD-10-CM | POA: Insufficient documentation

## 2016-03-02 DIAGNOSIS — R042 Hemoptysis: Secondary | ICD-10-CM

## 2016-03-02 DIAGNOSIS — I129 Hypertensive chronic kidney disease with stage 1 through stage 4 chronic kidney disease, or unspecified chronic kidney disease: Secondary | ICD-10-CM | POA: Insufficient documentation

## 2016-03-02 DIAGNOSIS — Z7982 Long term (current) use of aspirin: Secondary | ICD-10-CM | POA: Diagnosis not present

## 2016-03-02 DIAGNOSIS — G4733 Obstructive sleep apnea (adult) (pediatric): Secondary | ICD-10-CM | POA: Insufficient documentation

## 2016-03-02 DIAGNOSIS — I2510 Atherosclerotic heart disease of native coronary artery without angina pectoris: Secondary | ICD-10-CM | POA: Insufficient documentation

## 2016-03-02 DIAGNOSIS — Z79899 Other long term (current) drug therapy: Secondary | ICD-10-CM | POA: Diagnosis not present

## 2016-03-02 DIAGNOSIS — E1140 Type 2 diabetes mellitus with diabetic neuropathy, unspecified: Secondary | ICD-10-CM | POA: Insufficient documentation

## 2016-03-02 DIAGNOSIS — J449 Chronic obstructive pulmonary disease, unspecified: Secondary | ICD-10-CM | POA: Diagnosis not present

## 2016-03-02 DIAGNOSIS — C642 Malignant neoplasm of left kidney, except renal pelvis: Secondary | ICD-10-CM

## 2016-03-02 DIAGNOSIS — Z87891 Personal history of nicotine dependence: Secondary | ICD-10-CM | POA: Diagnosis not present

## 2016-03-02 DIAGNOSIS — D649 Anemia, unspecified: Secondary | ICD-10-CM | POA: Insufficient documentation

## 2016-03-02 DIAGNOSIS — E039 Hypothyroidism, unspecified: Secondary | ICD-10-CM | POA: Diagnosis present

## 2016-03-02 DIAGNOSIS — E119 Type 2 diabetes mellitus without complications: Secondary | ICD-10-CM

## 2016-03-02 DIAGNOSIS — Z85528 Personal history of other malignant neoplasm of kidney: Secondary | ICD-10-CM | POA: Insufficient documentation

## 2016-03-02 DIAGNOSIS — C7931 Secondary malignant neoplasm of brain: Secondary | ICD-10-CM | POA: Diagnosis not present

## 2016-03-02 DIAGNOSIS — E213 Hyperparathyroidism, unspecified: Secondary | ICD-10-CM | POA: Insufficient documentation

## 2016-03-02 DIAGNOSIS — I10 Essential (primary) hypertension: Secondary | ICD-10-CM | POA: Diagnosis present

## 2016-03-02 DIAGNOSIS — F419 Anxiety disorder, unspecified: Secondary | ICD-10-CM | POA: Diagnosis not present

## 2016-03-02 DIAGNOSIS — Z905 Acquired absence of kidney: Secondary | ICD-10-CM | POA: Insufficient documentation

## 2016-03-02 LAB — CBC WITH DIFFERENTIAL/PLATELET
BASO%: 0.9 % (ref 0.0–2.0)
BASOS ABS: 0.1 10*3/uL (ref 0.0–0.1)
Basophils Absolute: 0.1 10*3/uL (ref 0.0–0.1)
Basophils Relative: 0 %
EOS ABS: 0.6 10*3/uL — AB (ref 0.0–0.5)
EOS%: 5.3 % (ref 0.0–7.0)
Eosinophils Absolute: 0.6 10*3/uL (ref 0.0–0.7)
Eosinophils Relative: 5 %
HCT: 31.2 % — ABNORMAL LOW (ref 39.0–52.0)
HEMATOCRIT: 33.1 % — AB (ref 38.4–49.9)
HEMOGLOBIN: 10.5 g/dL — AB (ref 13.0–17.1)
Hemoglobin: 10 g/dL — ABNORMAL LOW (ref 13.0–17.0)
LYMPH#: 1.8 10*3/uL (ref 0.9–3.3)
LYMPH%: 15 % (ref 14.0–49.0)
Lymphocytes Relative: 18 %
Lymphs Abs: 2.1 10*3/uL (ref 0.7–4.0)
MCH: 29.9 pg (ref 27.2–33.4)
MCH: 30.6 pg (ref 26.0–34.0)
MCHC: 31.7 g/dL — AB (ref 32.0–36.0)
MCHC: 32.1 g/dL (ref 30.0–36.0)
MCV: 94.3 fL (ref 79.3–98.0)
MCV: 95.4 fL (ref 78.0–100.0)
MONO#: 0.8 10*3/uL (ref 0.1–0.9)
MONO%: 6.6 % (ref 0.0–14.0)
Monocytes Absolute: 0.7 10*3/uL (ref 0.1–1.0)
Monocytes Relative: 6 %
NEUT#: 8.5 10*3/uL — ABNORMAL HIGH (ref 1.5–6.5)
NEUT%: 72.2 % (ref 39.0–75.0)
Neutro Abs: 7.9 10*3/uL — ABNORMAL HIGH (ref 1.7–7.7)
Neutrophils Relative %: 71 %
PLATELETS: 320 10*3/uL (ref 140–400)
Platelets: 310 10*3/uL (ref 150–400)
RBC: 3.51 10*6/uL — ABNORMAL LOW (ref 4.20–5.82)
RBC: 3.27 MIL/uL — ABNORMAL LOW (ref 4.22–5.81)
RDW: 14.1 % (ref 11.0–14.6)
RDW: 13.4 % (ref 11.5–15.5)
WBC: 11.8 10*3/uL — ABNORMAL HIGH (ref 4.0–10.3)
WBC: 11.4 10*3/uL — ABNORMAL HIGH (ref 4.0–10.5)

## 2016-03-02 LAB — COMPREHENSIVE METABOLIC PANEL
ALT: 8 U/L (ref 0–55)
ANION GAP: 12 meq/L — AB (ref 3–11)
AST: 13 U/L (ref 5–34)
Albumin: 3.3 g/dL — ABNORMAL LOW (ref 3.5–5.0)
Alkaline Phosphatase: 90 U/L (ref 40–150)
BUN: 19 mg/dL (ref 7.0–26.0)
CALCIUM: 10.3 mg/dL (ref 8.4–10.4)
CHLORIDE: 102 meq/L (ref 98–109)
CO2: 26 meq/L (ref 22–29)
Creatinine: 1.2 mg/dL (ref 0.7–1.3)
EGFR: 61 mL/min/{1.73_m2} — ABNORMAL LOW (ref >90)
Glucose: 140 mg/dl (ref 70–140)
POTASSIUM: 4.5 meq/L (ref 3.5–5.1)
Sodium: 139 mEq/L (ref 136–145)
Total Bilirubin: 0.35 mg/dL (ref 0.20–1.20)
Total Protein: 7.7 g/dL (ref 6.4–8.3)

## 2016-03-02 LAB — BASIC METABOLIC PANEL
ANION GAP: 8 (ref 5–15)
BUN: 20 mg/dL (ref 6–20)
CHLORIDE: 102 mmol/L (ref 101–111)
CO2: 25 mmol/L (ref 22–32)
Calcium: 9.6 mg/dL (ref 8.9–10.3)
Creatinine, Ser: 1.16 mg/dL (ref 0.61–1.24)
GFR calc non Af Amer: >60 mL/min (ref >60)
Glucose, Bld: 117 mg/dL — ABNORMAL HIGH (ref 65–99)
POTASSIUM: 4.4 mmol/L (ref 3.5–5.1)
SODIUM: 135 mmol/L (ref 135–145)

## 2016-03-02 MED ORDER — DOCUSATE SODIUM 100 MG PO CAPS
100.0000 mg | ORAL_CAPSULE | Freq: Two times a day (BID) | ORAL | Status: DC
  Administered 2016-03-02 – 2016-03-04 (×4): 100 mg via ORAL
  Filled 2016-03-02 (×4): qty 1

## 2016-03-02 MED ORDER — CALCIUM CARBONATE 1250 (500 CA) MG PO TABS
1.0000 | ORAL_TABLET | Freq: Two times a day (BID) | ORAL | Status: DC
  Administered 2016-03-03 – 2016-03-04 (×3): 500 mg via ORAL
  Filled 2016-03-02 (×3): qty 1

## 2016-03-02 MED ORDER — ADULT MULTIVITAMIN W/MINERALS CH
ORAL_TABLET | Freq: Every morning | ORAL | Status: DC
  Administered 2016-03-03 – 2016-03-04 (×2): 1 via ORAL
  Filled 2016-03-02 (×2): qty 1

## 2016-03-02 MED ORDER — AMLODIPINE BESYLATE 5 MG PO TABS
10.0000 mg | ORAL_TABLET | Freq: Every day | ORAL | Status: DC
  Administered 2016-03-03: 10 mg via ORAL
  Filled 2016-03-02 (×2): qty 2

## 2016-03-02 MED ORDER — IOPAMIDOL (ISOVUE-370) INJECTION 76%
100.0000 mL | Freq: Once | INTRAVENOUS | Status: AC | PRN
  Administered 2016-03-02: 90 mL via INTRAVENOUS

## 2016-03-02 MED ORDER — ONDANSETRON HCL 4 MG/2ML IJ SOLN: 4.0000 mg | Freq: Four times a day (QID) | INTRAMUSCULAR | Status: DC | PRN

## 2016-03-02 MED ORDER — CALCIUM CARBONATE 1500 (600 CA) MG PO TABS
600.0000 mg | ORAL_TABLET | Freq: Two times a day (BID) | ORAL | Status: DC
  Filled 2016-03-02: qty 1

## 2016-03-02 MED ORDER — SODIUM CHLORIDE 0.9 % IV SOLN
INTRAVENOUS | Status: AC
  Administered 2016-03-02 – 2016-03-03 (×2): via INTRAVENOUS

## 2016-03-02 MED ORDER — ACETAMINOPHEN 650 MG RE SUPP: 650.0000 mg | Freq: Four times a day (QID) | RECTAL | Status: DC | PRN

## 2016-03-02 MED ORDER — ALPRAZOLAM 0.5 MG PO TABS
0.5000 mg | ORAL_TABLET | Freq: Two times a day (BID) | ORAL | Status: DC
  Administered 2016-03-02 – 2016-03-04 (×4): 0.5 mg via ORAL
  Filled 2016-03-02 (×4): qty 1

## 2016-03-02 MED ORDER — ONDANSETRON HCL 4 MG PO TABS: 4.0000 mg | ORAL_TABLET | Freq: Four times a day (QID) | ORAL | Status: DC | PRN

## 2016-03-02 MED ORDER — FLUOXETINE HCL 20 MG PO CAPS
40.0000 mg | ORAL_CAPSULE | Freq: Every day | ORAL | Status: DC
  Administered 2016-03-03 – 2016-03-04 (×2): 40 mg via ORAL
  Filled 2016-03-02 (×2): qty 2

## 2016-03-02 MED ORDER — MOMETASONE FURO-FORMOTEROL FUM 100-5 MCG/ACT IN AERO
2.0000 | INHALATION_SPRAY | Freq: Two times a day (BID) | RESPIRATORY_TRACT | Status: DC
  Administered 2016-03-02 – 2016-03-04 (×4): 2 via RESPIRATORY_TRACT
  Filled 2016-03-02: qty 8.8

## 2016-03-02 MED ORDER — MECLIZINE HCL 25 MG PO TABS
25.0000 mg | ORAL_TABLET | Freq: Three times a day (TID) | ORAL | Status: DC | PRN
  Filled 2016-03-02: qty 1

## 2016-03-02 MED ORDER — PROCHLORPERAZINE MALEATE 10 MG PO TABS: 10.0000 mg | ORAL_TABLET | Freq: Four times a day (QID) | ORAL | Status: DC | PRN

## 2016-03-02 MED ORDER — ACETAMINOPHEN 325 MG PO TABS: 650.0000 mg | ORAL_TABLET | Freq: Four times a day (QID) | ORAL | Status: DC | PRN

## 2016-03-02 MED ORDER — DOXYCYCLINE HYCLATE 100 MG IV SOLR
100.0000 mg | Freq: Two times a day (BID) | INTRAVENOUS | Status: DC
  Administered 2016-03-02 – 2016-03-04 (×4): 100 mg via INTRAVENOUS
  Filled 2016-03-02 (×5): qty 100

## 2016-03-02 MED ORDER — VITAMIN D3 25 MCG (1000 UNIT) PO TABS
1000.0000 [IU] | ORAL_TABLET | Freq: Every day | ORAL | Status: DC
  Administered 2016-03-02 – 2016-03-03 (×2): 1000 [IU] via ORAL
  Filled 2016-03-02 (×2): qty 1

## 2016-03-02 MED ORDER — ALBUTEROL SULFATE (2.5 MG/3ML) 0.083% IN NEBU
3.0000 mL | INHALATION_SOLUTION | Freq: Four times a day (QID) | RESPIRATORY_TRACT | Status: DC | PRN
  Administered 2016-03-03: 3 mL via RESPIRATORY_TRACT
  Filled 2016-03-02: qty 3

## 2016-03-02 MED ORDER — ENSURE ENLIVE PO LIQD
237.0000 mL | Freq: Two times a day (BID) | ORAL | Status: DC
  Administered 2016-03-03 – 2016-03-04 (×3): 237 mL via ORAL

## 2016-03-02 NOTE — ED Notes (Signed)
Hospitalist at bedside 

## 2016-03-02 NOTE — Assessment & Plan Note (Signed)
Patient states that he has had a "chest cold" for approximately 2-3 weeks.  He has had some nasal congestion and postnasal sinus drip.  He is also had a dry, nonproductive cough until early this morning.  He states that he developed bright red hemoptysis and a significant amount when he awoke this morning.  He states that he has been taking over-the-counter Mucinex and Delsym only.  He was seen by a primary care provider just yesterday for the cough.  Symptoms; and was diagnosed with a viral upper respiratory infection.  He was given a prescription for doxycycline to take only if the over-the-counter medications did not help.  The patient states that he has not initiated the doxycycline antibiotics as of yet.  Confirm the patient has been taking a baby aspirin on a daily basis.  Also, has both Advair and albuterol at home.  Patient denies any other new symptoms whatsoever.  He denies any recent fevers or chills.  Exam today reveals patient has a dry, non-congested cough.  Lungs noted to have wheezes to bilateral bases.  Patient appears mildly short of breath; but no acute distress.  Vital signs were essentially stable today; and patient was afebrile.  O2 sat was 98% on room air.  Labs obtained today reveal a mildly elevated white count at 11.8.  Otherwise-hemoglobin is stable, and platelets are within normal range.  Patient brought in a sample of the bright red hemoptysis in a cup.   Reviewed all findings with Dr. [name]; he recommended that patient proceed to the emergency department for further evaluation and management.  Brief history and report were called to the emergency department charge nurse; prior to the patient and transported to the emergency department for further evaluation and management.   Differential diagnosis includes progression of patient's disease, bleeding varices, or pneumonia.  Would need to consider restaging with a CT angiogram of the chest per Dr. [name].  CODE  STATUS: Patient should be considered a full code; since there are no advanced directives in the patient's chart.

## 2016-03-02 NOTE — ED Notes (Signed)
Nurse is going in the room and collect labs

## 2016-03-02 NOTE — Telephone Encounter (Signed)
Pt arrived to [facility] @ approx. 1:50 pm stating that he has not heard back from his phone call this morning to triage. He states he is coughing up blood-started this morning. Pt has cup filled 1/4 way with bright red blood.  Arrangement made for labs and then to be seen in [facility] by Selena Lesser, NP Spoke with patient re: the above.  Made Dr. Worthy Flank nurse aware of pt's situation.

## 2016-03-02 NOTE — ED Notes (Signed)
Patient transported to CT 

## 2016-03-02 NOTE — ED Notes (Signed)
Pt being sent by [facility].  C/o URI/producive cough x 3 weeks and hemoptysis starting today.  Sts moderate amount of bright red blood.  Hx of renal CA w/ lung mets.  No chemo or radiation.  Pt received immuno therapy on 10/31.

## 2016-03-02 NOTE — ED Triage Notes (Signed)
Pt sent from [place] productive cough/cold x "about 2 weeks" and hemoptysis starting today.  Denies pain.  Pt reports moderate amount of bright red blood.  Pt had blood work performed at [facility].

## 2016-03-02 NOTE — Progress Notes (Signed)
SYMPTOM MANAGEMENT CLINIC    Chief Complaint: Hemoptysis  HPI:  Patrick Breed Sr. 70 y.o. male diagnosed with renal cell carcinoma; with both lung and brain metastasis.  Patient is status post left nephrectomy and left adrenalectomy.  Patient has also undergone radiation to brain in the past as well.  Currently undergoing Nivolumab immunotherapy.    No history exists.    Review of Systems  Constitutional: Positive for malaise/fatigue.  Respiratory: Positive for cough, hemoptysis, shortness of breath and wheezing.   All other systems reviewed and are negative.   Past Medical History:  Diagnosis Date  . AAA (abdominal aortic aneurysm) ([place])   . Anxiety   . Asthma   . Cancer [facility])    Renal Cell- 2013; Lung 2017  . Chronic kidney disease    Renal Cell Adenocarcinoma  . Chronic kidney disease    Left Nephrectomy  . COPD (chronic obstructive pulmonary disease) ([place])   . Coronary artery disease   . Dehydration 12/16/2015  . Depression   . Diabetes mellitus without complication ([place])    Type II  . Eczema   . Encounter for antineoplastic chemotherapy 08/19/2015  . Encounter for antineoplastic immunotherapy 01/06/2016  . GERD (gastroesophageal reflux disease)   . Headache   . Heart murmur   . HPTH (hyperparathyroidism) ([place])   . Hx of radiation therapy 08/20/15   Right Temporal brain  . Hyperlipidemia   . Hyperparathyroidism ([place])   . Hypertension   . Hypertension 08/19/2015  . Hypothyroidism   . Malignant neoplasm of left kidney ([place]) 02/09/2013  . Neuropathy ([place])   . Shortness of breath dyspnea   . Sleep apnea    CPAP  . Thoracic aortic aneurysm ([place]) 2017    Past Surgical History:  Procedure Laterality Date  . BACK SURGERY     compressed vertebrae-put in a plate to seprate the spaces  . CARDIAC CATHETERIZATION    . CERVICAL FUSION    . CORONARY ARTERY BYPASS GRAFT     x 5  . ENDOBRONCHIAL ULTRASOUND N/A 06/30/2015   Procedure: ENDOHIAL ULTRASOUND;  Surgeon:  Flora Lipps, MD;  Location: [facility] ORS;  Service: Cardiopulmonary;  Laterality: N/A;  . FRACTURE SURGERY     left arm repair  . KNEE SURGERY     left knee repair  . PARATHYROIDECTOMY  12/17/09  . VIDEO BRONCHOSCOPY WITH ENDOBRONCHIAL ULTRASOUND N/A 07/09/2015   Procedure: VIDEO BRONCHOSCOPY WITH ENDOBRONCHIAL ULTRASOUND;  Surgeon: Ivin Poot, MD;  Location: [place];  Service: Thoracic;  Laterality: N/A;    has COPD, mild ([place]); Atherosclerosis of coronary artery; Clinical depression; Diabetes mellitus, type 2 ([place]); Essential (primary) hypertension; Cardiac murmur; HLD (hyperlipidemia); Adult hypothyroidism; Adiposity; Adenocarcinoma, renal cell ([place]); Arteriosclerosis of coronary artery; CAFL (chronic airflow limitation) ([place]); Enterogastritis; Acid reflux; HPTH (hyperparathyroidism) ([place]); BP (high blood pressure); Eczema intertrigo; Obstructive apnea; Malignant neoplasm of left kidney ([place]); Chronic obstructive pulmonary disease ([place]); Personal history of other malignant neoplasm of kidney; Hyperparathyroidism ([place]); Type 2 diabetes mellitus ([place]); Brain metastasis ([place]); Encounter for antineoplastic chemotherapy; Hypertension; Dehydration; Acute renal failure (ARF) ([place]); Encounter for antineoplastic immunotherapy; and Hemoptysis on his problem list.    is allergic to amoxicillin; codeine; penicillins; and ace inhibitors.    Medication List       Accurate as of 03/02/16  4:19 PM. Always use your most recent med list.          ADVAIR DISKUS 100-50 MCG/DOSE Aepb Generic drug:  Fluticasone-Salmeterol INHALE 1 PUFF TWICE A  DAY   albuterol 108 (90 Base) MCG/ACT inhaler Commonly known as:  PROVENTIL HFA;VENTOLIN HFA Inhale 1 puff into the lungs every 6 (six) hours as needed for wheezing.   ALPRAZolam 0.5 MG tablet Commonly known as:  XANAX TAKE ONE TABLET BY MOUTH TWICE DAILY AS NEEDED   amLODipine 10 MG tablet Commonly known as:  NORVASC TAKE ONE TABLET BY MOUTH EVERY DAY     aspirin 81 MG tablet Take 81 mg by mouth at bedtime. Reported on 07/01/2015   calcium carbonate 1250 (500 Ca) MG tablet Commonly known as:  OS-CAL - dosed in mg of elemental calcium Take 1 tablet (500 mg of elemental calcium total) by mouth 2 (two) times daily with a meal.   Cholecalciferol 1000 units tablet Take 1,000 Units by mouth at bedtime.   doxycycline 100 MG tablet Commonly known as:  VIBRA-TABS Take 1 tablet (100 mg total) by mouth 2 (two) times daily.   FLUoxetine 40 MG capsule Commonly known as:  PROZAC TAKE ONE CAPSULE BY MOUTH EVERY DAY   INTEGRA PLUS Caps Take 1 capsule by mouth every morning.   loperamide 2 MG capsule Commonly known as:  IMODIUM Take 2 mg by mouth 2 (two) times daily as needed for diarrhea or loose stools.   meclizine 25 MG tablet Commonly known as:  ANTIVERT Take 25 mg by mouth daily as needed for dizziness.   mometasone 0.1 % cream Commonly known as:  ELOCON Apply 1 application topically daily. Use daily as directed for up 2 weeks out of the month   ONE TOUCH ULTRA TEST test strip Generic drug:  glucose blood TEST TWICE DAILY   ONETOUCH DELICA LANCETS 35W Misc TEST TWICE DAILY   prochlorperazine 10 MG tablet Commonly known as:  COMPAZINE        PHYSICAL EXAMINATION  Oncology Vitals 03/02/2016 03/02/2016  Height 180 cm -  Weight 106.777 kg -  Weight (lbs) 235 lbs 6 oz -  BMI (kg/m2) 32.83 kg/m2 -  Temp - 97.5  Pulse - 80  Resp - 16  SpO2 - 98  BSA (m2) 2.31 m2 -   BP Readings from Last 2 Encounters:  03/02/16 146/77  03/02/16 133/65    Physical Exam  Constitutional: He is oriented to person, place, and time and well-developed, well-nourished, and in no distress.  HENT:  Head: Normocephalic and atraumatic.  Mouth/Throat: Oropharynx is clear and moist.  Eyes: Conjunctivae and EOM are normal. Pupils are equal, round, and reactive to light. Right eye exhibits no discharge. Left eye exhibits no discharge. No scleral  icterus.  Neck: Normal range of motion. Neck supple. No JVD present. No tracheal deviation present. No thyromegaly present.  Cardiovascular: Normal rate, regular rhythm, normal heart sounds and intact distal pulses.   Pulmonary/Chest: No respiratory distress. He has wheezes. He has no rales. He exhibits no tenderness.  Exam today reveals patient has a dry, non-congested cough.  Lungs noted to have wheezes to bilateral bases.  Patient appears mildly short of breath; but no acute distress.     Abdominal: Soft. Bowel sounds are normal. He exhibits no distension and no mass. There is no tenderness. There is no rebound and no guarding.  Musculoskeletal: Normal range of motion. He exhibits no edema, tenderness or deformity.  Lymphadenopathy:    He has no cervical adenopathy.  Neurological: He is alert and oriented to person, place, and time. Gait normal.  Skin: Skin is warm and dry. No rash noted. No erythema. No pallor.  Psychiatric: Affect normal.  Nursing note and vitals reviewed.   LABORATORY DATA:. Appointment on 03/02/2016  Component Date Value Ref Range Status  . WBC 03/02/2016 11.8* 4.0 - 10.3 10e3/uL Final  . NEUT# 03/02/2016 8.5* 1.5 - 6.5 10e3/uL Final  . HGB 03/02/2016 10.5* 13.0 - 17.1 g/dL Final  . HCT 03/02/2016 33.1* 38.4 - 49.9 % Final  . Platelets 03/02/2016 320  140 - 400 10e3/uL Final  . MCV 03/02/2016 94.3  79.3 - 98.0 fL Final  . MCH 03/02/2016 29.9  27.2 - 33.4 pg Final  . MCHC 03/02/2016 31.7* 32.0 - 36.0 g/dL Final  . RBC 03/02/2016 3.51* 4.20 - 5.82 10e6/uL Final  . RDW 03/02/2016 14.1  11.0 - 14.6 % Final  . lymph# 03/02/2016 1.8  0.9 - 3.3 10e3/uL Final  . MONO# 03/02/2016 0.8  0.1 - 0.9 10e3/uL Final  . Eosinophils Absolute 03/02/2016 0.6* 0.0 - 0.5 10e3/uL Final  . Basophils Absolute 03/02/2016 0.1  0.0 - 0.1 10e3/uL Final  . NEUT% 03/02/2016 72.2  39.0 - 75.0 % Final  . LYMPH% 03/02/2016 15.0  14.0 - 49.0 % Final  . MONO% 03/02/2016 6.6  0.0 - 14.0 %  Final  . EOS% 03/02/2016 5.3  0.0 - 7.0 % Final  . BASO% 03/02/2016 0.9  0.0 - 2.0 % Final  . Sodium 03/02/2016 139  136 - 145 mEq/L Final  . Potassium 03/02/2016 4.5  3.5 - 5.1 mEq/L Final  . Chloride 03/02/2016 102  98 - 109 mEq/L Final  . CO2 03/02/2016 26  22 - 29 mEq/L Final  . Glucose 03/02/2016 140  70 - 140 mg/dl Final  . BUN 03/02/2016 19.0  7.0 - 26.0 mg/dL Final  . Creatinine 03/02/2016 1.2  0.7 - 1.3 mg/dL Final  . Total Bilirubin 03/02/2016 0.35  0.20 - 1.20 mg/dL Final  . Alkaline Phosphatase 03/02/2016 90  40 - 150 U/L Final  . AST 03/02/2016 13  5 - 34 U/L Final  . ALT 03/02/2016 8  0 - 55 U/L Final  . Total Protein 03/02/2016 7.7  6.4 - 8.3 g/dL Final  . Albumin 03/02/2016 3.3* 3.5 - 5.0 g/dL Final  . Calcium 03/02/2016 10.3  8.4 - 10.4 mg/dL Final  . Anion Gap 03/02/2016 12* 3 - 11 mEq/L Final  . EGFR 03/02/2016 61* >90 ml/min/1.73 m2 Final    RADIOGRAPHIC STUDIES: No results found.  ASSESSMENT/PLAN:    Malignant neoplasm of left kidney [facility]) Patient received cycle 4 of his Nivolumab immunotherapy on 02/24/2016.  He presented to the [place] today with complaint of bright red hemoptysis.  See further notes for details.  Patient is scheduled to return on 03/09/2016 for labs, visit, and his next cycle of immunotherapy.  Hemoptysis Patient states that he has had a "chest cold" for approximately 2-3 weeks.  He has had some nasal congestion and postnasal sinus drip.  He is also had a dry, nonproductive cough until early this morning.  He states that he developed bright red hemoptysis and a significant amount when he awoke this morning.  He states that he has been taking over-the-counter Mucinex and Delsym only.  He was seen by a primary care provider just yesterday for the cough.  Symptoms; and was diagnosed with a viral upper respiratory infection.  He was given a prescription for doxycycline to take only if the over-the-counter medications did not help.  The patient  states that he has not initiated the doxycycline antibiotics as of yet.  Confirm the patient has been taking a baby aspirin on a daily basis.  Also, has both Advair and albuterol at home.  Patient denies any other new symptoms whatsoever.  He denies any recent fevers or chills.  Exam today reveals patient has a dry, non-congested cough.  Lungs noted to have wheezes to bilateral bases.  Patient appears mildly short of breath; but no acute distress.  Vital signs were essentially stable today; and patient was afebrile.  O2 sat was 98% on room air.  Labs obtained today reveal a mildly elevated white count at 11.8.  Otherwise-hemoglobin is stable, and platelets are within normal range.  Patient brought in a sample of the bright red hemoptysis in a cup.   Reviewed all findings with Dr. [name]; he recommended that patient proceed to the emergency department for further evaluation and management.  Brief history and report were called to the emergency department charge nurse; prior to the patient and transported to the emergency department for further evaluation and management.   Differential diagnosis includes progression of patient's disease, bleeding varices, or pneumonia.  Would need to consider restaging with a CT angiogram of the chest per Dr. [name].  CODE STATUS: Patient should be considered a full code; since there are no advanced directives in the patient's chart.    Patient stated understanding of all instructions; and was in agreement with this plan of care. The patient knows to call the clinic with any problems, questions or concerns.   Total time spent with patient was 40 minutes;  with greater than 75 percent of that time spent in face to face counseling regarding patient's symptoms,  and coordination of care and follow up.  Disclaimer:This dictation was prepared with Dragon/digital dictation along with [employer]. Any transcriptional errors that result from this  process are unintentional.  Drue Second, NP 03/02/2016   ADDENDUM: Hematology/Oncology Attending: I had a face to face encounter with the patient. I recommended his care plan. This is a very pleasant 70 years old white male with metastatic renal cell carcinoma currently undergoing second line therapy with Nivolumab status post 4 cycles. Has been tolerating his treatment fairly well with no significant adverse effects. He was seen in the clinic last week and was feeling well. The patient came to the clinic today complaining of coughing blood which is bright red and the amount was up to 1/2 cup of blood. I strongly recommended for the patient to go immediately to the emergency department for evaluation and repeat CT angiogram of the chest to rule out any disease progression, pulmonary embolism or pneumonia. If there is any evidence of disease progression, the patient may benefit from palliative radiotherapy during his hospitalization but he will need close monitoring until improvement of his hemoptysis. The patient agreed to the current plan. We will send him to the emergency department at [place]: This note was dictated with voice recognition software. Similar sounding words can inadvertently be transcribed and may be missed upon review. Eilleen Kempf., MD 03/02/16

## 2016-03-02 NOTE — Assessment & Plan Note (Signed)
Patient received cycle 4 of his Nivolumab immunotherapy on 02/24/2016.  He presented to the [facility] today with complaint of bright red hemoptysis.  See further notes for details.  Patient is scheduled to return on 03/09/2016 for labs, visit, and his next cycle of immunotherapy.

## 2016-03-02 NOTE — Telephone Encounter (Signed)
I have lung cancer and now a chest cold.  Saw my PCP who ordered Mucinex and Delsym.  This morning I started coughing.  I spit up blood that is really, really red and thick.  Would be a half cup if I had to measure the coughs this morning.  What do I need to do now?  Return number [id number]."

## 2016-03-02 NOTE — ED Provider Notes (Signed)
[facility] DEPT Provider Note   CSN: [id number] Arrival date & time: 03/02/16  1551     History   Chief Complaint Chief Complaint  Patient presents with  . CA Pt  . Cough  . Hemoptysis    HPI Patrick GREY Sr. is a 70 y.o. male.  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 He's had cough and a "chest cold" with congestion for about 1.5 weeks. Started on Mucinex and Delsym last night after consulting with his primary care physician. The cough went from nonproductive to having some productive sputum this morning. However he noticed it was bloody. He states he's done about half a cup all day. There is no chest pain, shortness of breath, or fever. He is on a baby aspirin but no other blood thinners. Called his oncologist and was told to come to the ER for concern for possible blood clot. No leg swelling. History of COPD but states he very infrequently gets wheezing. He is not having wheezing to his knowledge or shortness of breath. Is currently on chemotherapy.  Past Medical History:  Diagnosis Date  . AAA (abdominal aortic aneurysm) ([place])   . Anxiety   . Asthma   . Cancer [facility])    Renal Cell- 2013; Lung 2017  . Chronic kidney disease    Renal Cell Adenocarcinoma  . Chronic kidney disease    Left Nephrectomy  . COPD (chronic obstructive pulmonary disease) ([place])   . Coronary artery disease   . Dehydration 12/16/2015  . Depression   . Diabetes mellitus without complication ([place])    Type II  . Eczema   . Encounter for antineoplastic chemotherapy 08/19/2015  . Encounter for antineoplastic immunotherapy 01/06/2016  . GERD (gastroesophageal reflux disease)   . Headache   . Heart murmur   . HPTH (hyperparathyroidism) ([place])   . Hx of radiation therapy 08/20/15   Right Temporal brain  . Hyperlipidemia   . Hyperparathyroidism ([place])   . Hypertension   . Hypertension 08/19/2015  . Hypothyroidism   .  Malignant neoplasm of left kidney ([place]) 02/09/2013  . Neuropathy ([place])   . Shortness of breath dyspnea   . Sleep apnea    CPAP  . Thoracic aortic aneurysm [facility]) 2017    Patient Active Problem List   Diagnosis Date Noted  . Hemoptysis 03/02/2016  . Encounter for antineoplastic immunotherapy 01/06/2016  . Acute renal failure (ARF) ([place]) 12/19/2015  . Dehydration 12/16/2015  . Encounter for antineoplastic chemotherapy 08/19/2015  . Hypertension 08/19/2015  . Brain metastasis ([place]) 08/18/2015  . Chronic obstructive pulmonary disease ([place]) 02/13/2015  . Personal history of other malignant neoplasm of kidney 02/13/2015  . Hyperparathyroidism ([place]) 02/13/2015  . Type 2 diabetes mellitus ([place]) 02/13/2015  . Arteriosclerosis of coronary artery 12/09/2014  . CAFL (chronic airflow limitation) ([place]) 12/09/2014  . Enterogastritis 12/09/2014  . Acid reflux 12/09/2014  . HPTH (hyperparathyroidism) ([place]) 12/09/2014  . BP (high blood pressure) 12/09/2014  . Eczema intertrigo 12/09/2014  . Obstructive apnea 12/09/2014  . COPD, mild ([place]) 09/21/2014  . Atherosclerosis of coronary artery 09/21/2014  . Clinical depression 09/21/2014  . Diabetes mellitus, type 2 ([place]) 09/21/2014  . Essential (primary) hypertension 09/21/2014  . Cardiac murmur 09/21/2014  . HLD (hyperlipidemia) 09/21/2014  . Adult hypothyroidism 09/21/2014  . Adiposity 09/21/2014  . Adenocarcinoma, renal cell ([place]) 09/21/2014  . Malignant neoplasm of left kidney ([place]) 02/09/2013    Past Surgical  History:  Procedure Laterality Date  . BACK SURGERY     compressed vertebrae-put in a plate to seprate the spaces  . CARDIAC CATHETERIZATION    . CERVICAL FUSION    . CORONARY ARTERY BYPASS GRAFT     x 5  . ENDOBRONCHIAL ULTRASOUND N/A 06/30/2015   Procedure: ENDOHIAL ULTRASOUND;  Surgeon: Flora Lipps, MD;  Location: [facility] ORS;  Service: Cardiopulmonary;  Laterality: N/A;  . FRACTURE SURGERY     left arm repair  . KNEE SURGERY       left knee repair  . PARATHYROIDECTOMY  12/17/09  . VIDEO BRONCHOSCOPY WITH ENDOBRONCHIAL ULTRASOUND N/A 07/09/2015   Procedure: VIDEO BRONCHOSCOPY WITH ENDOBRONCHIAL ULTRASOUND;  Surgeon: Ivin Poot, MD;  Location: [facility] OR;  Service: Thoracic;  Laterality: N/A;       Home Medications    Prior to Admission medications   Medication Sig Start Date End Date Taking? Authorizing Provider  albuterol (PROVENTIL HFA;VENTOLIN HFA) 108 (90 BASE) MCG/ACT inhaler Inhale 1-2 puffs into the lungs every 6 (six) hours as needed for wheezing or shortness of breath.    Yes Historical Provider, MD  ALPRAZolam Duanne Moron) 0.5 MG tablet Take 0.5 mg by mouth 2 (two) times daily.   Yes Historical Provider, MD  amLODipine (NORVASC) 10 MG tablet Take 10 mg by mouth daily.   Yes Historical Provider, MD  aspirin EC 81 MG tablet Take 81 mg by mouth at bedtime.   Yes Historical Provider, MD  calcium carbonate (OSCAL) 1500 (600 Ca) MG TABS tablet Take 600 mg of elemental calcium by mouth 2 (two) times daily with a meal.   Yes Historical Provider, MD  cholecalciferol (VITAMIN D) 1000 units tablet Take 1,000 Units by mouth at bedtime.   Yes Historical Provider, MD  docusate sodium (COLACE) 100 MG capsule Take 100 mg by mouth 2 (two) times daily.   Yes Historical Provider, MD  FeFum-FePoly-FA-B Cmp-C-Biot (INTEGRA PLUS) CAPS Take 1 capsule by mouth every morning. 01/06/16  Yes Curt Bears, MD  FLUoxetine (PROZAC) 40 MG capsule Take 40 mg by mouth daily.   Yes Historical Provider, MD  Fluticasone-Salmeterol (ADVAIR) 100-50 MCG/DOSE AEPB Inhale 1 puff into the lungs 2 (two) times daily.   Yes Historical Provider, MD  meclizine (ANTIVERT) 25 MG tablet Take 25 mg by mouth 3 (three) times daily as needed for dizziness.  06/18/13  Yes Historical Provider, MD  mometasone (ELOCON) 0.1 % cream Apply 1 application topically 2 (two) times daily as needed (for itching).   Yes Historical Provider, MD  NIVOLUMAB IV Inject 240 mg into  the vein every 14 (fourteen) days.   Yes Historical Provider, MD  prochlorperazine (COMPAZINE) 10 MG tablet Take 10 mg by mouth every 6 (six) hours as needed for nausea or vomiting.    Yes Historical Provider, MD  doxycycline (VIBRA-TABS) 100 MG tablet Take 1 tablet (100 mg total) by mouth 2 (two) times daily. 03/01/16   Carmon Ginsberg, PA    Family History Family History  Problem Relation Age of Onset  . Stroke Mother   . Cardiomyopathy Mother   . Hypertension Mother   . Hypertension Father   . Congestive Heart Failure Father   . Heart disease Brother   . Heart attack Brother   . Hypertension Brother   . Diabetes Brother   . Obesity Brother   . Heart disease Brother   . Sleep apnea Son     Social History Social History  Substance Use Topics  . Smoking  status: Former Smoker    Packs/day: 1.50    Years: 35.00    Types: Cigarettes    Quit date: 12/24/2001  . Smokeless tobacco: Never Used  . Alcohol use No     Allergies   Amoxicillin; Codeine; Penicillins; and Ace inhibitors   Review of Systems Review of Systems  Constitutional: Negative for fever.  HENT: Positive for congestion.   Respiratory: Positive for cough. Negative for shortness of breath.   Cardiovascular: Negative for chest pain and leg swelling.  All other systems reviewed and are negative.    Physical Exam Updated Vital Signs BP 116/56 (BP Location: Right Arm)   Pulse 78   Temp 98.6 F (37 C) (Oral)   Resp 19   Ht '5\' 11"'$  (1.803 m)   Wt 235 lb 6.4 oz (106.8 kg)   SpO2 97%   BMI 32.83 kg/m   Physical Exam  Constitutional: He is oriented to person, place, and time. He appears well-developed and well-nourished. No distress.  HENT:  Head: Normocephalic and atraumatic.  Right Ear: External ear normal.  Left Ear: External ear normal.  Nose: Nose normal.  Eyes: Right eye exhibits no discharge. Left eye exhibits no discharge.  Neck: Neck supple.  Cardiovascular: Normal rate, regular rhythm and  normal heart sounds.   Pulmonary/Chest: Effort normal. He has wheezes (mild expiratory, diffuse).  Abdominal: Soft. There is no tenderness.  Musculoskeletal: He exhibits no edema.  Neurological: He is alert and oriented to person, place, and time.  Skin: Skin is warm and dry. He is not diaphoretic.  Nursing note and vitals reviewed.    ED Treatments / Results  Labs (all labs ordered are listed, but only abnormal results are displayed) Labs Reviewed  BASIC METABOLIC PANEL - Abnormal; Notable for the following:       Result Value   Glucose, Bld 117 (*)    All other components within normal limits  CBC WITH DIFFERENTIAL/PLATELET - Abnormal; Notable for the following:    WBC 11.4 (*)    RBC 3.27 (*)    Hemoglobin 10.0 (*)    HCT 31.2 (*)    Neutro Abs 7.9 (*)    All other components within normal limits    EKG  EKG Interpretation None       Radiology Ct Angio Chest Pe W And/or Wo Contrast  Result Date: 03/02/2016 CLINICAL DATA:  History of renal cell adenocarcinoma with cold for 2-3 weeks, known prior lung mass, initial encounter EXAM: CT ANGIOGRAPHY CHEST WITH CONTRAST TECHNIQUE: Multidetector CT imaging of the chest was performed using the standard protocol during bolus administration of intravenous contrast. Multiplanar CT image reconstructions and MIPs were obtained to evaluate the vascular anatomy. CONTRAST:  90 mL Isovue 370. COMPARISON:  01/02/2016 FINDINGS: Cardiovascular: The thoracic aorta shows atherosclerotic calcifications and changes of prior coronary bypass grafting similar to that seen on the prior exam. Dilatation of the ascending aorta is again identified to approximately 5 cm. This is stable when compared with the prior exam. Cardiac structures are within normal limits. Heavy coronary calcifications are noted. The pulmonary artery demonstrates a normal branching pattern. Some attenuation of the branches on the left is noted due to mass effect although no definitive  pulmonary emboli are seen. Similar findings are noted on the right but to a lesser degree. Mediastinum/Nodes: Better visualized due to the administration of IV contrast is a large conglomeration of lymphadenopathy within the AP window measuring approximately 5.6 x 5.9 cm. This shows some increase in  bulk when compared with the prior exam and some central decreased attenuation suggestive of necrosis. Bulky left hilar lymph nodes are noted also increased in size from the prior exam. The largest of these measures approximately 2.3 cm in short axis. The subcarinal adenopathy measures 3.4 cm in short axis increased from 1.9 cm on the prior exam. Increasing right hilar adenopathy is noted. The largest area measures approximately 2 cm in short axis best seen on image number 155 of series 10. Lungs/Pleura: The nodule seen in the left upper lobe has increased in size now measuring approximately 6 mm. The lingular nodule seen previously now measures approximately 10 mm increased in size from the prior exam. Previously seen left lower lobe mass has also increased in size now measuring approximately 5.5 by 5.1 cm. The predominant growth is noted from right to left when compared with the prior exam. A few smaller nodules are noted within the left lower lobe also consistent with progression of disease. The right lung remains clear with the exception of enlargement along the major fissure best seen on image number 43 of series 11 now measuring almost 10 mm increased from 4 mm. Upper Abdomen: Visualized upper abdomen shows changes consistent with prior left nephrectomy. No new focal abnormality is seen. Musculoskeletal: Postsurgical changes are noted in the lower cervical spine. No compression deformities are seen. Mild degenerative changes noted within the thoracic spine. No definitive lytic or sclerotic lesion is identified to suggest metastatic disease. 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 The T7  sclerotic lesions seen previously is stable and again likely represents a bone island. Review of the MIP images confirms the above findings. IMPRESSION: Significant progression in the degree of mediastinal and hilar lymphadenopathy bilaterally. Additionally progression in the dominant left lower lobe mass lesion as well as multiple scattered nodules throughout both lungs. This is consistent with progression of metastatic disease. No evidence of pulmonary emboli although there are findings of attenuation/compression of the pulmonary arterial branches particularly on the left secondary to hilar adenopathy and mediastinal adenopathy Stable ascending aortic dilatation. Ascending thoracic aortic aneurysm. Recommend semi-annual imaging followup by CTA or MRA and referral to cardiothoracic surgery if not already obtained. This recommendation follows 2010 ACCF/AHA/AATS/ACR/ASA/SCA/SCAI/SIR/STS/SVM Guidelines for the Diagnosis and Management of Patients With Thoracic Aortic Disease. Circulation. 2010; 121: P509-T267 Electronically Signed   By: Inez Catalina M.D.   On: 03/02/2016 17:33    Procedures Procedures (including critical care time)  Medications Ordered in ED Medications  iopamidol (ISOVUE-370) 76 % injection 100 mL (90 mLs Intravenous Contrast Given 03/02/16 1711)     Initial Impression / Assessment and Plan / ED Course  I have reviewed the triage vital signs and the nursing notes.  Pertinent labs & imaging results that were available during my care of the patient were reviewed by me and considered in my medical decision making (see chart for details).  Clinical Course as of Mar 02 2030  Tue Mar 02, 2016  1629 Will get CTA to r/o PE but also eval for PNA or worsening lung mass. I highly doubt pneumothorax so I don't think CXR would be helpful since he'll require CT either way.  [SG]  1845 D/w flow management, will page out at 7 pm to Dr. Hal Hope for admission due to recent influx of consults to  hospitalist. Remains stable  [SG]    Clinical Course User Index [SG] Sherwood Gambler, MD    Patient has remained stable. However given that he has  had about one cup worth of hemoptysis throughout the day, oncology recommends overnight observation and they will get radiation oncology to consult the patient in the morning. Stable for a floor admission, telemetry per the hospitalist.  Final Clinical Impressions(s) / ED Diagnoses   Final diagnoses:  Hemoptysis    New Prescriptions New Prescriptions   No medications on file     Sherwood Gambler, MD 03/02/16 2032

## 2016-03-02 NOTE — H&P (Signed)
History and Physical    [name] HCW:[id number] DOB: [birth year] DOA: 03/02/2016  PCP: [name], MD  Patient coming from: Home.  Chief Complaint: Hemoptysis.  HPI: [name] Sr. is a 70 y.o. male with history of metastatic renal cell carcinoma, CAD, COPD, diabetes mellitus presents to the ER because of hemoptysis. Over the last 2 days patient has been having symptoms of respiratory tract infection with cough and productive sputum. Patient had gone to his PCP yesterday and was given cough syrup and if required antibiotic. Since this morning patient has been having copious amount of hemoptysis, patient had called his oncologist who had referred him to the ER. CT angiogram of the chest shows progression of his known metastases. Patient's oncologist Dr. [name] has advised admission for further observation and may need radiation if hemoptysis continues.   ED Course: CT angiogram of the chest was done which showed progression of his known metastatic cancer.  Review of Systems: As per HPI, rest all negative.   Past Medical History:  Diagnosis Date  . AAA (abdominal aortic aneurysm) ([place])   . Anxiety   . Asthma   . Cancer [facility])    Renal Cell- 2013; Lung 2017  . Chronic kidney disease    Renal Cell Adenocarcinoma  . Chronic kidney disease    Left Nephrectomy  . COPD (chronic obstructive pulmonary disease) ([place])   . Coronary artery disease   . Dehydration 12/16/2015  . Depression   . Diabetes mellitus without complication ([place])    Type II  . Eczema   . Encounter for antineoplastic chemotherapy 08/19/2015  . Encounter for antineoplastic immunotherapy 01/06/2016  . GERD (gastroesophageal reflux disease)   . Headache   . Heart murmur   . HPTH (hyperparathyroidism) ([place])   . Hx of radiation therapy 08/20/15   Right Temporal brain  . Hyperlipidemia   . Hyperparathyroidism ([place])   . Hypertension   . Hypertension 08/19/2015  . Hypothyroidism   . Malignant neoplasm of  left kidney ([place]) 02/09/2013  . Neuropathy ([place])   . Shortness of breath dyspnea   . Sleep apnea    CPAP  . Thoracic aortic aneurysm ([place]) 2017    Past Surgical History:  Procedure Laterality Date  . BACK SURGERY     compressed vertebrae-put in a plate to seprate the spaces  . CARDIAC CATHETERIZATION    . CERVICAL FUSION    . CORONARY ARTERY BYPASS GRAFT     x 5  . ENDOBRONCHIAL ULTRASOUND N/A 06/30/2015   Procedure: ENDOHIAL ULTRASOUND;  Surgeon: Flora Lipps, MD;  Location: [facility] ORS;  Service: Cardiopulmonary;  Laterality: N/A;  . FRACTURE SURGERY     left arm repair  . KNEE SURGERY     left knee repair  . PARATHYROIDECTOMY  12/17/09  . VIDEO BRONCHOSCOPY WITH ENDOBRONCHIAL ULTRASOUND N/A 07/09/2015   Procedure: VIDEO BRONCHOSCOPY WITH ENDOBRONCHIAL ULTRASOUND;  Surgeon: Ivin Poot, MD;  Location: [facility];  Service: Thoracic;  Laterality: N/A;     reports that he quit smoking about 14 years ago. His smoking use included Cigarettes. He has a 52.50 pack-year smoking history. He has never used smokeless tobacco. He reports that he does not drink alcohol or use drugs.  Allergies  Allergen Reactions  . Amoxicillin Anaphylaxis, Rash and Other (See Comments)    Has patient had a PCN reaction causing immediate rash, facial/tongue/throat swelling, SOB or lightheadedness with hypotension: Yes Has patient had a PCN reaction causing severe rash involving  mucus membranes or skin necrosis: No Has patient had a PCN reaction that required hospitalization No Has patient had a PCN reaction occurring within the last 10 years: No If all of the above answers are "NO", then may proceed with Cephalosporin use.  . Codeine Anaphylaxis, Diarrhea and Rash  . Penicillins Anaphylaxis, Rash and Other (See Comments)    Has patient had a PCN reaction causing immediate rash, facial/tongue/throat swelling, SOB or lightheadedness with hypotension: Yes Has patient had a PCN reaction causing severe rash  involving mucus membranes or skin necrosis: No Has patient had a PCN reaction that required hospitalization No Has patient had a PCN reaction occurring within the last 10 years: No If all of the above answers are "NO", then may proceed with Cephalosporin use.  . Ace Inhibitors Other (See Comments) and Cough    Reaction:  Wheezing     Family History  Problem Relation Age of Onset  . Stroke Mother   . Cardiomyopathy Mother   . Hypertension Mother   . Hypertension Father   . Congestive Heart Failure Father   . Heart disease Brother   . Heart attack Brother   . Hypertension Brother   . Diabetes Brother   . Obesity Brother   . Heart disease Brother   . Sleep apnea Son     Prior to Admission medications   Medication Sig Start Date End Date Taking? Authorizing Provider  albuterol (PROVENTIL HFA;VENTOLIN HFA) 108 (90 BASE) MCG/ACT inhaler Inhale 1-2 puffs into the lungs every 6 (six) hours as needed for wheezing or shortness of breath.    Yes Historical Provider, MD  ALPRAZolam Duanne Moron) 0.5 MG tablet Take 0.5 mg by mouth 2 (two) times daily.   Yes Historical Provider, MD  amLODipine (NORVASC) 10 MG tablet Take 10 mg by mouth daily.   Yes Historical Provider, MD  aspirin EC 81 MG tablet Take 81 mg by mouth at bedtime.   Yes Historical Provider, MD  calcium carbonate (OSCAL) 1500 (600 Ca) MG TABS tablet Take 600 mg of elemental calcium by mouth 2 (two) times daily with a meal.   Yes Historical Provider, MD  cholecalciferol (VITAMIN D) 1000 units tablet Take 1,000 Units by mouth at bedtime.   Yes Historical Provider, MD  docusate sodium (COLACE) 100 MG capsule Take 100 mg by mouth 2 (two) times daily.   Yes Historical Provider, MD  FeFum-FePoly-FA-B Cmp-C-Biot (INTEGRA PLUS) CAPS Take 1 capsule by mouth every morning. 01/06/16  Yes Curt Bears, MD  FLUoxetine (PROZAC) 40 MG capsule Take 40 mg by mouth daily.   Yes Historical Provider, MD  Fluticasone-Salmeterol (ADVAIR) 100-50 MCG/DOSE AEPB  Inhale 1 puff into the lungs 2 (two) times daily.   Yes Historical Provider, MD  meclizine (ANTIVERT) 25 MG tablet Take 25 mg by mouth 3 (three) times daily as needed for dizziness.  06/18/13  Yes Historical Provider, MD  mometasone (ELOCON) 0.1 % cream Apply 1 application topically 2 (two) times daily as needed (for itching).   Yes Historical Provider, MD  NIVOLUMAB IV Inject 240 mg into the vein every 14 (fourteen) days.   Yes Historical Provider, MD  prochlorperazine (COMPAZINE) 10 MG tablet Take 10 mg by mouth every 6 (six) hours as needed for nausea or vomiting.    Yes Historical Provider, MD  doxycycline (VIBRA-TABS) 100 MG tablet Take 1 tablet (100 mg total) by mouth 2 (two) times daily. 03/01/16   Carmon Ginsberg, PA    Physical Exam: Vitals:   03/02/16  1555 03/02/16 1556 03/02/16 1818 03/02/16 2127  BP: 146/77  116/56 116/67  Pulse: 80  78 83  Resp: '16  19 17  '$ Temp: 97.5 F (36.4 C)  98.6 F (37 C)   TempSrc: Oral  Oral   SpO2: 98%  97% 95%  Weight:  106.8 kg (235 lb 6.4 oz)    Height:  '5\' 11"'$  (1.803 m)        Constitutional: Well built and nourished. Vitals:   03/02/16 1555 03/02/16 1556 03/02/16 1818 03/02/16 2127  BP: 146/77  116/56 116/67  Pulse: 80  78 83  Resp: '16  19 17  '$ Temp: 97.5 F (36.4 C)  98.6 F (37 C)   TempSrc: Oral  Oral   SpO2: 98%  97% 95%  Weight:  106.8 kg (235 lb 6.4 oz)    Height:  '5\' 11"'$  (1.803 m)     Eyes: Anicteric no pallor. ENMT: No discharge from the ears eyes nose or mouth. Neck: No mass felt. No neck rigidity. Respiratory: No rhonchi or crepitations. Cardiovascular: S1-S2 heard. Abdomen: Soft nontender bowel sounds present. Musculoskeletal: No edema. No joint effusion. Skin: No rash. Skin appears warm. Neurologic: Alert awake oriented to time place and person. Moves all extremities. Psychiatric: Appears normal. Normal affect.   Labs on Admission: I have personally reviewed following labs and imaging studies  CBC:  Recent  Labs Lab 03/02/16 1420 03/02/16 1645  WBC 11.8* 11.4*  NEUTROABS 8.5* 7.9*  HGB 10.5* 10.0*  HCT 33.1* 31.2*  MCV 94.3 95.4  PLT 320 062   Basic Metabolic Panel:  Recent Labs Lab 03/02/16 1420 03/02/16 1645  NA 139 135  K 4.5 4.4  CL  --  102  CO2 26 25  GLUCOSE 140 117*  BUN 19.0 20  CREATININE 1.2 1.16  CALCIUM 10.3 9.6   GFR: Estimated Creatinine Clearance: 73.7 mL/min (by C-G formula based on SCr of 1.16 mg/dL). Liver Function Tests:  Recent Labs Lab 03/02/16 1420  AST 13  ALT 8  ALKPHOS 90  BILITOT 0.35  PROT 7.7  ALBUMIN 3.3*   No results for input(s): LIPASE, AMYLASE in the last 168 hours. No results for input(s): AMMONIA in the last 168 hours. Coagulation Profile: No results for input(s): INR, PROTIME in the last 168 hours. Cardiac Enzymes: No results for input(s): CKTOTAL, CKMB, CKMBINDEX, TROPONINI in the last 168 hours. BNP (last 3 results) No results for input(s): PROBNP in the last 8760 hours. HbA1C: No results for input(s): HGBA1C in the last 72 hours. CBG: No results for input(s): GLUCAP in the last 168 hours. Lipid Profile: No results for input(s): CHOL, HDL, LDLCALC, TRIG, CHOLHDL, LDLDIRECT in the last 72 hours. Thyroid Function Tests: No results for input(s): TSH, T4TOTAL, FREET4, T3FREE, THYROIDAB in the last 72 hours. Anemia Panel: No results for input(s): VITAMINB12, FOLATE, FERRITIN, TIBC, IRON, RETICCTPCT in the last 72 hours. Urine analysis:    Component Value Date/Time   COLORURINE YELLOW 12/19/2015 2118   APPEARANCEUR CLEAR 12/19/2015 2118   LABSPEC 1.019 12/19/2015 2118   PHURINE 5.0 12/19/2015 2118   GLUCOSEU NEGATIVE 12/19/2015 2118   HGBUR NEGATIVE 12/19/2015 2118   BILIRUBINUR SMALL (A) 12/19/2015 2118   KETONESUR NEGATIVE 12/19/2015 2118   PROTEINUR NEGATIVE 12/19/2015 2118   UROBILINOGEN 1.0 05/08/2009 2305   NITRITE NEGATIVE 12/19/2015 2118   LEUKOCYTESUR SMALL (A) 12/19/2015 2118   Sepsis  Labs: '@LABRCNTIP'$ (procalcitonin:4,lacticidven:4) )No results found for this or any previous visit (from the past 240 hour(s)).  Radiological Exams on Admission: Ct Angio Chest Pe W And/or Wo Contrast  Result Date: 03/02/2016 CLINICAL DATA:  History of renal cell adenocarcinoma with cold for 2-3 weeks, known prior lung mass, initial encounter EXAM: CT ANGIOGRAPHY CHEST WITH CONTRAST TECHNIQUE: Multidetector CT imaging of the chest was performed using the standard protocol during bolus administration of intravenous contrast. Multiplanar CT image reconstructions and MIPs were obtained to evaluate the vascular anatomy. CONTRAST:  90 mL Isovue 370. COMPARISON:  01/02/2016 FINDINGS: Cardiovascular: The thoracic aorta shows atherosclerotic calcifications and changes of prior coronary bypass grafting similar to that seen on the prior exam. Dilatation of the ascending aorta is again identified to approximately 5 cm. This is stable when compared with the prior exam. Cardiac structures are within normal limits. Heavy coronary calcifications are noted. The pulmonary artery demonstrates a normal branching pattern. Some attenuation of the branches on the left is noted due to mass effect although no definitive pulmonary emboli are seen. Similar findings are noted on the right but to a lesser degree. Mediastinum/Nodes: Better visualized due to the administration of IV contrast is a large conglomeration of lymphadenopathy within the AP window measuring approximately 5.6 x 5.9 cm. This shows some increase in bulk when compared with the prior exam and some central decreased attenuation suggestive of necrosis. Bulky left hilar lymph nodes are noted also increased in size from the prior exam. The largest of these measures approximately 2.3 cm in short axis. The subcarinal adenopathy measures 3.4 cm in short axis increased from 1.9 cm on the prior exam. Increasing right hilar adenopathy is noted. The largest area measures  approximately 2 cm in short axis best seen on image number 155 of series 10. Lungs/Pleura: The nodule seen in the left upper lobe has increased in size now measuring approximately 6 mm. The lingular nodule seen previously now measures approximately 10 mm increased in size from the prior exam. Previously seen left lower lobe mass has also increased in size now measuring approximately 5.5 by 5.1 cm. The predominant growth is noted from right to left when compared with the prior exam. A few smaller nodules are noted within the left lower lobe also consistent with progression of disease. The right lung remains clear with the exception of enlargement along the major fissure best seen on image number 43 of series 11 now measuring almost 10 mm increased from 4 mm. Upper Abdomen: Visualized upper abdomen shows changes consistent with prior left nephrectomy. No new focal abnormality is seen. Musculoskeletal: Postsurgical changes are noted in the lower cervical spine. No compression deformities are seen. Mild degenerative changes noted within the thoracic spine. No definitive lytic or sclerotic lesion is identified to suggest metastatic disease. 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 The T7 sclerotic lesions seen previously is stable and again likely represents a bone island. Review of the MIP images confirms the above findings. IMPRESSION: Significant progression in the degree of mediastinal and hilar lymphadenopathy bilaterally. Additionally progression in the dominant left lower lobe mass lesion as well as multiple scattered nodules throughout both lungs. This is consistent with progression of metastatic disease. No evidence of pulmonary emboli although there are findings of attenuation/compression of the pulmonary arterial branches particularly on the left secondary to hilar adenopathy and mediastinal adenopathy Stable ascending aortic dilatation. Ascending thoracic aortic aneurysm. Recommend  semi-annual imaging followup by CTA or MRA and referral to cardiothoracic surgery if not already obtained. This recommendation follows 2010 ACCF/AHA/AATS/ACR/ASA/SCA/SCAI/SIR/STS/SVM Guidelines for the Diagnosis and Management of  Patients With Thoracic Aortic Disease. Circulation. 2010; 121: I097-D532 Electronically Signed   By: Inez Catalina M.D.   On: 03/02/2016 17:33     Assessment/Plan Principal Problem:   Hemoptysis Active Problems:   COPD, mild (HCC)   Adult hypothyroidism   Malignant neoplasm of left kidney ([place])   Type 2 diabetes mellitus ([place])   Brain metastasis ([place])   Hypertension    1. Hemoptysis with known history of metastatic renal cell carcinoma - will hold off aspirin and follow CBC. If hemoptysis continues may need radiation as per the oncologist. I have empirically placed patient on doxycycline. 2. COPD - presently not wheezing. Continue home inhalers. 3. CAD - holding off aspirin due to hemoptysis. 4. Anemia - follow CBC. 5. Diabetes mellitus type 2 is off medications after patient had lost weight. 6. OSA on CPAP. 7. Hypertension on amlodipine.   DVT prophylaxis: SCDs. Code Status: Full code.  Family Communication: Patient's son.  Disposition Plan: Home.  Consults called: None.  Admission status: Observation.    Rise Patience MD [facility] Pager [phone].  If 7PM-7AM, please contact night-coverage [email or web address] Password [email or web address]  03/02/2016, 10:10 PM

## 2016-03-03 DIAGNOSIS — I10 Essential (primary) hypertension: Secondary | ICD-10-CM

## 2016-03-03 DIAGNOSIS — R042 Hemoptysis: Secondary | ICD-10-CM

## 2016-03-03 DIAGNOSIS — J449 Chronic obstructive pulmonary disease, unspecified: Secondary | ICD-10-CM

## 2016-03-03 DIAGNOSIS — C7931 Secondary malignant neoplasm of brain: Secondary | ICD-10-CM | POA: Diagnosis not present

## 2016-03-03 DIAGNOSIS — E039 Hypothyroidism, unspecified: Secondary | ICD-10-CM | POA: Diagnosis not present

## 2016-03-03 DIAGNOSIS — C642 Malignant neoplasm of left kidney, except renal pelvis: Secondary | ICD-10-CM

## 2016-03-03 LAB — BASIC METABOLIC PANEL
Anion gap: 9 (ref 5–15)
BUN: 20 mg/dL (ref 6–20)
CALCIUM: 9.2 mg/dL (ref 8.9–10.3)
CHLORIDE: 103 mmol/L (ref 101–111)
CO2: 25 mmol/L (ref 22–32)
CREATININE: 1.09 mg/dL (ref 0.61–1.24)
GFR calc non Af Amer: >60 mL/min (ref >60)
Glucose, Bld: 118 mg/dL — ABNORMAL HIGH (ref 65–99)
Potassium: 4.2 mmol/L (ref 3.5–5.1)
SODIUM: 137 mmol/L (ref 135–145)

## 2016-03-03 LAB — CBC
HCT: 29.5 % — ABNORMAL LOW (ref 39.0–52.0)
Hemoglobin: 9.5 g/dL — ABNORMAL LOW (ref 13.0–17.0)
MCH: 30.8 pg (ref 26.0–34.0)
MCHC: 32.2 g/dL (ref 30.0–36.0)
MCV: 95.8 fL (ref 78.0–100.0)
PLATELETS: 321 10*3/uL (ref 150–400)
RBC: 3.08 MIL/uL — AB (ref 4.22–5.81)
RDW: 13.7 % (ref 11.5–15.5)
WBC: 10.9 10*3/uL — AB (ref 4.0–10.5)

## 2016-03-03 NOTE — Care Management Note (Signed)
Case Management Note  Patient Details  Name: [name]. MRN: [id number] Date of Birth: 07-14-1945  Subjective/Objective: 70 y/o m admitted w/hemoptysis. From home.                   Action/Plan:d/c home.   Expected Discharge Date:   (unknown)               Expected Discharge Plan:  Home/Self Care  In-House Referral:     Discharge planning Services  CM Consult  Post Acute Care Choice:    Choice offered to:     DME Arranged:    DME Agency:     HH Arranged:    HH Agency:     Status of Service:  In process, will continue to follow  If discussed at Long Length of Stay Meetings, dates discussed:    Additional Comments:  Dessa Phi, RN 03/03/2016, 4:04 PM

## 2016-03-03 NOTE — Progress Notes (Signed)
PROGRESS NOTE    [name]  HLK:[id number]  DOB: [birth year]  DOA: 03/02/2016 PCP: [name], MD Outpatient Specialists:   Hospital course:  HPI: [name]. is a 70 y.o. male with history of metastatic renal cell carcinoma, CAD, COPD, diabetes mellitus presents to the ER because of hemoptysis. Over the last 2 days patient has been having symptoms of respiratory tract infection with cough and productive sputum. Patient had gone to his PCP yesterday and was given cough syrup and if required antibiotic. Since this morning patient has been having copious amount of hemoptysis, patient had called his oncologist who had referred him to the ER. CT angiogram of the chest shows progression of his known metastases. Patient's oncologist Dr. [name] has advised admission for further observation and may need radiation if hemoptysis continues.   Assessment & Plan:   1. Hemoptysis with known history of metastatic renal cell carcinoma - will hold off aspirin and follow CBC. If hemoptysis continues may need radiation as per the oncologist. I have empirically placed patient on doxycycline. 2. COPD - presently not wheezing. Continue home inhalers. 3. CAD - holding off aspirin due to hemoptysis. 4. Anemia - follow CBC. 5. Diabetes mellitus type 2 is off medications after patient had lost weight. 6. OSA on CPAP. 7. Hypertension on amlodipine   DVT prophylaxis: SCDs. Code Status: Full code.  Family Communication: Patient's son.  Disposition Plan: Home possibly 11/9.  Consults called: None.  Admission status: Observation.  Consultants:  Oncology [facility])    Subjective: Pt says no recent hemoptysis and pain better controlled   Objective: Vitals:   03/02/16 2127 03/02/16 2220 03/03/16 0504 03/03/16 0858  BP: 116/67 124/80 116/64   Pulse: 83 90 89   Resp: '17 18 18   '$ Temp:  98.2 F (36.8 C) 98.2 F (36.8 C)   TempSrc:  Oral Oral   SpO2: 95% 97% 95% 96%  Weight:  105.8  kg (233 lb 4 oz)    Height:  '5\' 11"'$  (1.803 m)      Intake/Output Summary (Last 24 hours) at 03/03/16 1047 Last data filed at 03/03/16 0630  Gross per 24 hour  Intake           924.17 ml  Output              225 ml  Net           699.17 ml   Filed Weights   03/02/16 1556 03/02/16 2220  Weight: 106.8 kg (235 lb 6.4 oz) 105.8 kg (233 lb 4 oz)    Exam:  Eyes: Anicteric no pallor. ENMT: No discharge from the ears eyes nose or mouth. Neck: No mass felt. No neck rigidity. Respiratory: No rhonchi or crepitations. Cardiovascular: S1-S2 heard. Abdomen: Soft nontender bowel sounds present. Musculoskeletal: No edema. No joint effusion. Skin: No rash. Skin appears warm. Neurologic: Alert awake oriented to time place and person. Moves all extremities. Psychiatric: Appears normal. Normal affect.   Data Reviewed: Basic Metabolic Panel:  Recent Labs Lab 03/02/16 1420 03/02/16 1645 03/03/16 0451  NA 139 135 137  K 4.5 4.4 4.2  CL  --  102 103  CO2 '26 25 25  '$ GLUCOSE 140 117* 118*  BUN 19.0 20 20  CREATININE 1.2 1.16 1.09  CALCIUM 10.3 9.6 9.2   Liver Function Tests:  Recent Labs Lab 03/02/16 1420  AST 13  ALT 8  ALKPHOS 90  BILITOT 0.35  PROT 7.7  ALBUMIN 3.3*  No results for input(s): LIPASE, AMYLASE in the last 168 hours. No results for input(s): AMMONIA in the last 168 hours. CBC:  Recent Labs Lab 03/02/16 1420 03/02/16 1645 03/03/16 0451  WBC 11.8* 11.4* 10.9*  NEUTROABS 8.5* 7.9*  --   HGB 10.5* 10.0* 9.5*  HCT 33.1* 31.2* 29.5*  MCV 94.3 95.4 95.8  PLT 320 310 321   Cardiac Enzymes: No results for input(s): CKTOTAL, CKMB, CKMBINDEX, TROPONINI in the last 168 hours. CBG (last 3)  No results for input(s): GLUCAP in the last 72 hours. No results found for this or any previous visit (from the past 240 hour(s)).   Studies: Ct Angio Chest Pe W And/or Wo Contrast  Result Date: 03/02/2016 CLINICAL DATA:  History of renal cell adenocarcinoma with cold  for 2-3 weeks, known prior lung mass, initial encounter EXAM: CT ANGIOGRAPHY CHEST WITH CONTRAST TECHNIQUE: Multidetector CT imaging of the chest was performed using the standard protocol during bolus administration of intravenous contrast. Multiplanar CT image reconstructions and MIPs were obtained to evaluate the vascular anatomy. CONTRAST:  90 mL Isovue 370. COMPARISON:  01/02/2016 FINDINGS: Cardiovascular: The thoracic aorta shows atherosclerotic calcifications and changes of prior coronary bypass grafting similar to that seen on the prior exam. Dilatation of the ascending aorta is again identified to approximately 5 cm. This is stable when compared with the prior exam. Cardiac structures are within normal limits. Heavy coronary calcifications are noted. The pulmonary artery demonstrates a normal branching pattern. Some attenuation of the branches on the left is noted due to mass effect although no definitive pulmonary emboli are seen. Similar findings are noted on the right but to a lesser degree. Mediastinum/Nodes: Better visualized due to the administration of IV contrast is a large conglomeration of lymphadenopathy within the AP window measuring approximately 5.6 x 5.9 cm. This shows some increase in bulk when compared with the prior exam and some central decreased attenuation suggestive of necrosis. Bulky left hilar lymph nodes are noted also increased in size from the prior exam. The largest of these measures approximately 2.3 cm in short axis. The subcarinal adenopathy measures 3.4 cm in short axis increased from 1.9 cm on the prior exam. Increasing right hilar adenopathy is noted. The largest area measures approximately 2 cm in short axis best seen on image number 155 of series 10. Lungs/Pleura: The nodule seen in the left upper lobe has increased in size now measuring approximately 6 mm. The lingular nodule seen previously now measures approximately 10 mm increased in size from the prior exam.  Previously seen left lower lobe mass has also increased in size now measuring approximately 5.5 by 5.1 cm. The predominant growth is noted from right to left when compared with the prior exam. A few smaller nodules are noted within the left lower lobe also consistent with progression of disease. The right lung remains clear with the exception of enlargement along the major fissure best seen on image number 43 of series 11 now measuring almost 10 mm increased from 4 mm. Upper Abdomen: Visualized upper abdomen shows changes consistent with prior left nephrectomy. No new focal abnormality is seen. Musculoskeletal: Postsurgical changes are noted in the lower cervical spine. No compression deformities are seen. Mild degenerative changes noted within the thoracic spine. No definitive lytic or sclerotic lesion is identified to suggest metastatic disease. 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 The T7 sclerotic lesions seen previously is stable and again likely represents a bone island. Review of the MIP  images confirms the above findings. IMPRESSION: Significant progression in the degree of mediastinal and hilar lymphadenopathy bilaterally. Additionally progression in the dominant left lower lobe mass lesion as well as multiple scattered nodules throughout both lungs. This is consistent with progression of metastatic disease. No evidence of pulmonary emboli although there are findings of attenuation/compression of the pulmonary arterial branches particularly on the left secondary to hilar adenopathy and mediastinal adenopathy Stable ascending aortic dilatation. Ascending thoracic aortic aneurysm. Recommend semi-annual imaging followup by CTA or MRA and referral to cardiothoracic surgery if not already obtained. This recommendation follows 2010 ACCF/AHA/AATS/ACR/ASA/SCA/SCAI/SIR/STS/SVM Guidelines for the Diagnosis and Management of Patients With Thoracic Aortic Disease. Circulation. 2010; 121:  V670-L410 Electronically Signed   By: Inez Catalina M.D.   On: 03/02/2016 17:33     Scheduled Meds: . ALPRAZolam  0.5 mg Oral BID  . amLODipine  10 mg Oral Daily  . calcium carbonate  1 tablet Oral BID WC  . cholecalciferol  1,000 Units Oral QHS  . docusate sodium  100 mg Oral BID  . doxycycline (VIBRAMYCIN) IV  100 mg Intravenous Q12H  . feeding supplement (ENSURE ENLIVE)  237 mL Oral BID BM  . FLUoxetine  40 mg Oral Daily  . mometasone-formoterol  2 puff Inhalation BID  . multivitamin with minerals   Oral q morning - 10a   Continuous Infusions: . sodium chloride 50 mL/hr at 03/02/16 2301    Principal Problem:   Hemoptysis Active Problems:   COPD, mild (HCC)   Adult hypothyroidism   Malignant neoplasm of left kidney ([place])   Type 2 diabetes mellitus ([place])   Brain metastasis ([place])   Hypertension   Time spent:   Irwin Brakeman, MD, FAAFP [facility] Pager [phone]  If 7PM-7AM, please contact night-coverage [email or web address] Password [email or web address] 03/03/2016, 10:47 AM    LOS: 0 days

## 2016-03-03 NOTE — Progress Notes (Signed)
Initial Nutrition Assessment  DOCUMENTATION CODES:   Obesity unspecified  INTERVENTION:  - Continue Ensure Enlive po BID, each supplement provides 350 kcal and 20 grams of protein - Continue to encourage PO intakes of meals and supplements. - RD will continue to monitor for additional nutrition-related needs.  NUTRITION DIAGNOSIS:   Increased nutrient needs related to catabolic illness, cancer and cancer related treatments as evidenced by estimated needs.   GOAL:   Patient will meet greater than or equal to 90% of their needs  MONITOR:   PO intake, Supplement acceptance, Weight trends, Labs, I & O's  REASON FOR ASSESSMENT:   Malnutrition Screening Tool  ASSESSMENT:   70 y.o. male with history of metastatic renal cell carcinoma, CAD, COPD, diabetes mellitus presents to the ER because of hemoptysis. Over the last 2 days patient has been having symptoms of respiratory tract infection with cough and productive sputum. Patient had gone to his PCP yesterday and was given cough syrup and if required antibiotic. Since this morning patient has been having copious amount of hemoptysis, patient had called his oncologist who had referred him to the ER. CT angiogram of the chest shows progression of his known metastases. Patient's oncologist Dr. [name] has advised admission for further observation and may need radiation if hemoptysis continues  Pt seen for MST. BMI indicates obesity. No intakes documented but pt reports 100% completion of breakfast this AM without abdominal pain, nausea, or swallowing difficulties. He states that when he was on chemotherapy he had very poor appetite and taste alteration (metallic taste) and that the only things he could really taste were sweet items. Since starting immunotherapy ~2.5 months ago he has had very good appetite and no issues with taste sensation/taste alteration. PTA he was drinking Glucerna Shakes and intakes varied depending on meal intakes; he was  consuming more of them per day while undergoing chemo. Will continue Ensure order d/t protein content.  Physical assessment shows no muscle or fat wasting and no edema at this time. Pt reports that while he was on chemo he lost ~70 lbs and that he has recently regained a few pounds. Weight has been stable over the past 2 months. Pt reports that weight loss began in February 2017; based on CBW, this indicates 23% body weight in <1 year which is significant for time frame. No other criterion for malnutrition able to be determined at this time but will continue to monitor and document.  Medications reviewed; 1 tablet Os-cal BID, 1000 units oral vitamin D/day, 100 mg Colace BID, daily multivitamin with minerals, PRN Zofran, PRN oral Compazine.  Labs reviewed.  IVF: NS @ 50 mL/hr.      Diet Order:  Diet heart healthy/carb modified Room service appropriate? Yes; Fluid consistency: Thin  Skin:  Reviewed, no issues  Last BM:  PTA/unknown  Height:   Ht Readings from Last 1 Encounters:  03/02/16 '5\' 11"'$  (1.803 m)    Weight:   Wt Readings from Last 1 Encounters:  03/02/16 233 lb 4 oz (105.8 kg)    Ideal Body Weight:  78.18 kg  BMI:  Body mass index is 32.53 kg/m.  Estimated Nutritional Needs:   Kcal:  2115-2330 (20-22 kcal/kg)  Protein:  102-117 grams (1.3-1.5 grams/kg)  Fluid:  >/= 2.1 L/day  EDUCATION NEEDS:   No education needs identified at this time    Patrick Matin, MS, RD, LDN Inpatient Clinical Dietitian Pager # [phone] After hours/weekend pager # [phone]

## 2016-03-04 DIAGNOSIS — R042 Hemoptysis: Secondary | ICD-10-CM | POA: Diagnosis not present

## 2016-03-04 DIAGNOSIS — J449 Chronic obstructive pulmonary disease, unspecified: Secondary | ICD-10-CM | POA: Diagnosis not present

## 2016-03-04 DIAGNOSIS — E039 Hypothyroidism, unspecified: Secondary | ICD-10-CM | POA: Diagnosis not present

## 2016-03-04 DIAGNOSIS — C7931 Secondary malignant neoplasm of brain: Secondary | ICD-10-CM | POA: Diagnosis not present

## 2016-03-04 LAB — BASIC METABOLIC PANEL
ANION GAP: 7 (ref 5–15)
BUN: 20 mg/dL (ref 6–20)
CALCIUM: 9.3 mg/dL (ref 8.9–10.3)
CO2: 27 mmol/L (ref 22–32)
CREATININE: 1.12 mg/dL (ref 0.61–1.24)
Chloride: 103 mmol/L (ref 101–111)
GLUCOSE: 136 mg/dL — AB (ref 65–99)
Potassium: 4.2 mmol/L (ref 3.5–5.1)
Sodium: 137 mmol/L (ref 135–145)

## 2016-03-04 LAB — CBC
HCT: 30.1 % — ABNORMAL LOW (ref 39.0–52.0)
HEMOGLOBIN: 9.7 g/dL — AB (ref 13.0–17.0)
MCH: 30.8 pg (ref 26.0–34.0)
MCHC: 32.2 g/dL (ref 30.0–36.0)
MCV: 95.6 fL (ref 78.0–100.0)
PLATELETS: 312 10*3/uL (ref 150–400)
RBC: 3.15 MIL/uL — AB (ref 4.22–5.81)
RDW: 13.6 % (ref 11.5–15.5)
WBC: 10 10*3/uL (ref 4.0–10.5)

## 2016-03-04 MED ORDER — ENSURE ENLIVE PO LIQD: 237.0000 mL | Freq: Two times a day (BID) | ORAL | 12 refills | Status: DC

## 2016-03-04 MED ORDER — ASPIRIN EC 81 MG PO TBEC: 81.0000 mg | DELAYED_RELEASE_TABLET | Freq: Every day | ORAL | Status: DC

## 2016-03-04 NOTE — Discharge Summary (Signed)
Physician Discharge Summary  [name]. XTG:[id number] DOB: August 03, 1945 DOA: 03/02/2016  PCP: [name], MD  Admit date: 03/02/2016 Discharge date: 03/04/2016 Discharge Condition: STABLE CODE STATUS: FULL  Brief/Interim Summary: HPI: Patrick Breed Jonesis a 70 y.o.malewith history of metastatic renal cell carcinoma, CAD, COPD, diabetes mellitus presents to the ER because of hemoptysis. Over the last 2 days patient has been having symptoms of respiratory tract infection with cough and productive sputum. Patient had gone to his PCP yesterday and was given cough syrup and if required antibiotic. Since this morning patient has been having copious amount of hemoptysis, patient had called his oncologist who had referred him to the ER. CT angiogram of the chest shows progression of his known metastases. Patient's oncologist Dr. [name] has advised admission for further observation and may need radiation if hemoptysis continues.  Assessment & Plan:   1. Hemoptysiswith known history of metastatic renal cell carcinoma - will hold aspirin and follow CBC. If hemoptysis continues may need radiation as per the oncologist but did not have further hemoptysis in the hospital.   2. COPD- presently not wheezing. Continue home inhalers. 3. CAD - holding off aspirin due to hemoptysis. Resume on Monday. 4. Anemia- followed CBC. Hb remained stable 9.7 at discharge 5. Diabetes mellitus type 2 is off medications after patient had lost weight. 6. OSA on CPAP. 7. Hypertensionon amlodipine   DVT prophylaxis:SCDs. Code Status:Full code. Family Communication:Patient's son. Disposition Plan:Home possibly 11/9. Consults called:None. Admission status:Observation.  Consultants:  Oncology [name])   Discharge Diagnoses:  Principal Problem:   Hemoptysis Active Problems:   COPD, mild ([place])   Adult hypothyroidism   Malignant neoplasm of left kidney ([place])   Type 2 diabetes mellitus  ([place])   Brain metastasis ([place])   Hypertension  Discharge Instructions  Discharge Instructions    Increase activity slowly    Complete by:  As directed        Medication List    STOP taking these medications   NIVOLUMAB IV     TAKE these medications   albuterol 108 (90 Base) MCG/ACT inhaler Commonly known as:  PROVENTIL HFA;VENTOLIN HFA Inhale 1-2 puffs into the lungs every 6 (six) hours as needed for wheezing or shortness of breath.   ALPRAZolam 0.5 MG tablet Commonly known as:  XANAX Take 0.5 mg by mouth 2 (two) times daily.   amLODipine 10 MG tablet Commonly known as:  NORVASC Take 10 mg by mouth daily.   aspirin EC 81 MG tablet Take 1 tablet (81 mg total) by mouth at bedtime. Start taking on:  03/08/2016   calcium carbonate 1500 (600 Ca) MG Tabs tablet Commonly known as:  OSCAL Take 600 mg of elemental calcium by mouth 2 (two) times daily with a meal.   cholecalciferol 1000 units tablet Commonly known as:  VITAMIN D Take 1,000 Units by mouth at bedtime.   docusate sodium 100 MG capsule Commonly known as:  COLACE Take 100 mg by mouth 2 (two) times daily.   doxycycline 100 MG tablet Commonly known as:  VIBRA-TABS Take 1 tablet (100 mg total) by mouth 2 (two) times daily.   feeding supplement (ENSURE ENLIVE) Liqd Take 237 mLs by mouth 2 (two) times daily between meals.   FLUoxetine 40 MG capsule Commonly known as:  PROZAC Take 40 mg by mouth daily.   Fluticasone-Salmeterol 100-50 MCG/DOSE Aepb Commonly known as:  ADVAIR Inhale 1 puff into the lungs 2 (two) times daily.   INTEGRA PLUS Caps  Take 1 capsule by mouth every morning.   meclizine 25 MG tablet Commonly known as:  ANTIVERT Take 25 mg by mouth 3 (three) times daily as needed for dizziness.   mometasone 0.1 % cream Commonly known as:  ELOCON Apply 1 application topically 2 (two) times daily as needed (for itching).   prochlorperazine 10 MG tablet Commonly known as:  COMPAZINE Take 10 mg  by mouth every 6 (six) hours as needed for nausea or vomiting.      Follow-up Information    [name], MD. Schedule an appointment as soon as possible for a visit in 2 week(s).   Specialty:  Family Medicine Contact information: [address] [place] [address] [phone]        MOHAMED,MOHAMED K., MD. Schedule an appointment as soon as possible for a visit in 1 week(s).   Specialty:  Oncology Why:  Hospital Follow Up  Contact information: [address] [place] [address] [phone]          Allergies  Allergen Reactions  . Amoxicillin Anaphylaxis, Rash and Other (See Comments)    Has patient had a PCN reaction causing immediate rash, facial/tongue/throat swelling, SOB or lightheadedness with hypotension: Yes Has patient had a PCN reaction causing severe rash involving mucus membranes or skin necrosis: No Has patient had a PCN reaction that required hospitalization No Has patient had a PCN reaction occurring within the last 10 years: No If all of the above answers are "NO", then may proceed with Cephalosporin use.  . Codeine Anaphylaxis, Diarrhea and Rash  . Penicillins Anaphylaxis, Rash and Other (See Comments)    Has patient had a PCN reaction causing immediate rash, facial/tongue/throat swelling, SOB or lightheadedness with hypotension: Yes Has patient had a PCN reaction causing severe rash involving mucus membranes or skin necrosis: No Has patient had a PCN reaction that required hospitalization No Has patient had a PCN reaction occurring within the last 10 years: No If all of the above answers are "NO", then may proceed with Cephalosporin use.  . Ace Inhibitors Other (See Comments) and Cough    Reaction:  Wheezing     Procedures/Studies: Ct Angio Chest Pe W And/or Wo Contrast  Result Date: 03/02/2016 CLINICAL DATA:  History of renal cell adenocarcinoma with cold for 2-3 weeks, known prior lung mass, initial encounter EXAM: CT  ANGIOGRAPHY CHEST WITH CONTRAST TECHNIQUE: Multidetector CT imaging of the chest was performed using the standard protocol during bolus administration of intravenous contrast. Multiplanar CT image reconstructions and MIPs were obtained to evaluate the vascular anatomy. CONTRAST:  90 mL Isovue 370. COMPARISON:  01/02/2016 FINDINGS: Cardiovascular: The thoracic aorta shows atherosclerotic calcifications and changes of prior coronary bypass grafting similar to that seen on the prior exam. Dilatation of the ascending aorta is again identified to approximately 5 cm. This is stable when compared with the prior exam. Cardiac structures are within normal limits. Heavy coronary calcifications are noted. The pulmonary artery demonstrates a normal branching pattern. Some attenuation of the branches on the left is noted due to mass effect although no definitive pulmonary emboli are seen. Similar findings are noted on the right but to a lesser degree. Mediastinum/Nodes: Better visualized due to the administration of IV contrast is a large conglomeration of lymphadenopathy within the AP window measuring approximately 5.6 x 5.9 cm. This shows some increase in bulk when compared with the prior exam and some central decreased attenuation suggestive of necrosis. Bulky left hilar lymph nodes are  noted also increased in size from the prior exam. The largest of these measures approximately 2.3 cm in short axis. The subcarinal adenopathy measures 3.4 cm in short axis increased from 1.9 cm on the prior exam. Increasing right hilar adenopathy is noted. The largest area measures approximately 2 cm in short axis best seen on image number 155 of series 10. Lungs/Pleura: The nodule seen in the left upper lobe has increased in size now measuring approximately 6 mm. The lingular nodule seen previously now measures approximately 10 mm increased in size from the prior exam. Previously seen left lower lobe mass has also increased in size now  measuring approximately 5.5 by 5.1 cm. The predominant growth is noted from right to left when compared with the prior exam. A few smaller nodules are noted within the left lower lobe also consistent with progression of disease. The right lung remains clear with the exception of enlargement along the major fissure best seen on image number 43 of series 11 now measuring almost 10 mm increased from 4 mm. Upper Abdomen: Visualized upper abdomen shows changes consistent with prior left nephrectomy. No new focal abnormality is seen. Musculoskeletal: Postsurgical changes are noted in the lower cervical spine. No compression deformities are seen. Mild degenerative changes noted within the thoracic spine. No definitive lytic or sclerotic lesion is identified to suggest metastatic disease. 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 The T7 sclerotic lesions seen previously is stable and again likely represents a bone island. Review of the MIP images confirms the above findings. IMPRESSION: Significant progression in the degree of mediastinal and hilar lymphadenopathy bilaterally. Additionally progression in the dominant left lower lobe mass lesion as well as multiple scattered nodules throughout both lungs. This is consistent with progression of metastatic disease. No evidence of pulmonary emboli although there are findings of attenuation/compression of the pulmonary arterial branches particularly on the left secondary to hilar adenopathy and mediastinal adenopathy Stable ascending aortic dilatation. Ascending thoracic aortic aneurysm. Recommend semi-annual imaging followup by CTA or MRA and referral to cardiothoracic surgery if not already obtained. This recommendation follows 2010 ACCF/AHA/AATS/ACR/ASA/SCA/SCAI/SIR/STS/SVM Guidelines for the Diagnosis and Management of Patients With Thoracic Aortic Disease. Circulation. 2010; 121: I144-R154 Electronically Signed   By: Inez Catalina M.D.   On: 03/02/2016  17:33   Mr Jeri Cos MG Contrast  Result Date: 02/05/2016 CLINICAL DATA:  SRS staging protocol, follow-up previous lesions. EXAM: MRI HEAD WITHOUT AND WITH CONTRAST TECHNIQUE: Multiplanar, multiecho pulse sequences of the brain and surrounding structures were obtained without and with intravenous contrast. CONTRAST:  73m MULTIHANCE GADOBENATE DIMEGLUMINE 529 MG/ML IV SOLN COMPARISON:  The prior brain MRIs from 11/03/2015 as well as 08/15/2015. FINDINGS: Brain: Generalized cerebral atrophy is stable from prior. Patchy T2/FLAIR hyperintensity within the periventricular, deep, and subcortical white matter both cerebral hemispheres again noted, likely related to moderate chronic microvascular ischemic changes. Scattered remote lacunar infarcts present within the bilateral basal ganglia and thalami. Additional scattered remote lacunar infarcts within the bilateral cerebellar hemispheres. There is a punctate focus of diffusion abnormality within the cortical gray matter of the left frontal operculum (series 4, image 60). Associated FLAIR signal abnormality (series 8, image 15). No appreciable enhancement with his focus. Finding favored to reflect a tiny cortical acute versus subacute ischemic infarct. No other evidence for acute or subacute ischemia. Gray-white matter differentiation otherwise maintained. No evidence for acute intracranial hemorrhage. Small amount of chronic hemosiderin staining overlies the right parietal convexity. 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 Overall, this lesion is not significantly changed in size relative to prior exam, measuring 4 x 4 x 5 mm (series 11, image 73). Additional nodular focus of enhancement within the mesial right temporal lobe described on previous study is no longer seen. No other new lesions or abnormal enhancement identified. No midline shift or mass effect. No hydrocephalus. No  extra-axial fluid collection. Major dural sinuses are patent. The the the Vascular: Major intracranial vascular flow voids are maintained. Skull and upper cervical spine: Craniocervical junction normal. Degenerative thickening noted at the tectorial membrane. Visualized upper cervical spine otherwise unremarkable. Bone marrow signal intensity within normal limits. Scalp soft tissues unremarkable. Sinuses/Orbits: Globes and orbital soft tissues within normal limits. Paranasal sinuses are clear. No mastoid effusion. Inner ear structures normal. Other: No other significant finding. IMPRESSION: 1. No significant interval change in size and appearance of periventricular/subependymal lesion at the temporal horn of the right lateral ventricle, measuring 4 x 4 x 5 mm on today's study (previously 4 x 4 x 5 mm). 2. Previously noted 1 x 2 mm focus of enhancement within the adjacent mesial right temporal lobe no longer seen. 3. No other new lesions identified. 4. Punctate focus of diffusion abnormality within the cortex of the left frontal lobe, suspicious for a tiny acute/early subacute ischemic infarct. Electronically Signed   By: Jeannine Boga M.D.   On: 02/05/2016 15:04    (Echo, Carotid, EGD, Colonoscopy, ERCP)    Subjective: NO further hemoptysis  Discharge Exam: Vitals:   03/03/16 2326 03/04/16 0614  BP: 106/64 98/66  Pulse: 86 75  Resp: 18 18  Temp: 98.1 F (36.7 C) 97.6 F (36.4 C)   Vitals:   03/03/16 1300 03/03/16 2326 03/04/16 0614 03/04/16 0831  BP: (!) 122/44 106/64 98/66   Pulse: 83 86 75   Resp: '18 18 18   '$ Temp: 98.6 F (37 C) 98.1 F (36.7 C) 97.6 F (36.4 C)   TempSrc: Oral Oral Oral   SpO2: 98% 96% 94% 94%  Weight:      Height:       Eyes:Anicteric no pallor. ENMT:No discharge from the ears eyes nose or mouth. Neck:No mass felt. No neck rigidity. Respiratory:No rhonchi or crepitations. Cardiovascular:S1-S2 heard. Abdomen:Soft nontender bowel sounds  present. Musculoskeletal:No edema. No joint effusion. Skin:No rash. Skin appears warm. Neurologic:Alert awake oriented to time place and person. Moves all extremities. Psychiatric:Appears normal. Normal affect.   The results of significant diagnostics from this hospitalization (including imaging, microbiology, ancillary and laboratory) are listed below for reference.     Microbiology: No results found for this or any previous visit (from the past 240 hour(s)).   Labs: BNP (last 3 results) No results for input(s): BNP in the last 8760 hours. Basic Metabolic Panel:  Recent Labs Lab 03/02/16 1420 03/02/16 1645 03/03/16 0451 03/04/16 0444  NA 139 135 137 137  K 4.5 4.4 4.2 4.2  CL  --  102 103 103  CO2 '26 25 25 27  '$ GLUCOSE 140 117* 118* 136*  BUN 19.0 '20 20 20  '$ CREATININE 1.2 1.16 1.09 1.12  CALCIUM 10.3 9.6 9.2 9.3   Liver Function Tests:  Recent Labs Lab 03/02/16 1420  AST 13  ALT 8  ALKPHOS 90  BILITOT 0.35  PROT 7.7  ALBUMIN 3.3*   No results for input(s): LIPASE, AMYLASE in the last 168 hours. No results for input(s): AMMONIA in the last 168 hours. CBC:  Recent Labs  Lab 03/02/16 1420 03/02/16 1645 03/03/16 0451 03/04/16 0444  WBC 11.8* 11.4* 10.9* 10.0  NEUTROABS 8.5* 7.9*  --   --   HGB 10.5* 10.0* 9.5* 9.7*  HCT 33.1* 31.2* 29.5* 30.1*  MCV 94.3 95.4 95.8 95.6  PLT 320 310 321 312   Cardiac Enzymes: No results for input(s): CKTOTAL, CKMB, CKMBINDEX, TROPONINI in the last 168 hours. BNP: Invalid input(s): POCBNP CBG: No results for input(s): GLUCAP in the last 168 hours. D-Dimer No results for input(s): DDIMER in the last 72 hours. Hgb A1c No results for input(s): HGBA1C in the last 72 hours. Lipid Profile No results for input(s): CHOL, HDL, LDLCALC, TRIG, CHOLHDL, LDLDIRECT in the last 72 hours. Thyroid function studies No results for input(s): TSH, T4TOTAL, T3FREE, THYROIDAB in the last 72 hours.  Invalid input(s): FREET3 Anemia  work up No results for input(s): VITAMINB12, FOLATE, FERRITIN, TIBC, IRON, RETICCTPCT in the last 72 hours. Urinalysis    Component Value Date/Time   COLORURINE YELLOW 12/19/2015 2118   APPEARANCEUR CLEAR 12/19/2015 2118   LABSPEC 1.019 12/19/2015 2118   PHURINE 5.0 12/19/2015 2118   GLUCOSEU NEGATIVE 12/19/2015 2118   HGBUR NEGATIVE 12/19/2015 2118   BILIRUBINUR SMALL (A) 12/19/2015 2118   KETONESUR NEGATIVE 12/19/2015 2118   PROTEINUR NEGATIVE 12/19/2015 2118   UROBILINOGEN 1.0 05/08/2009 2305   NITRITE NEGATIVE 12/19/2015 2118   LEUKOCYTESUR SMALL (A) 12/19/2015 2118   Sepsis Labs Invalid input(s): PROCALCITONIN,  WBC,  LACTICIDVEN Microbiology No results found for this or any previous visit (from the past 240 hour(s)).   Time coordinating discharge: 28 minutes  SIGNED:  Irwin Brakeman, MD  [facility] 03/04/2016, 1:09 PM Pager   If 7PM-7AM, please contact night-coverage [email or web address] Password [email or web address]

## 2016-03-04 NOTE — Progress Notes (Signed)
Pt given discharge instructions and all questions answered. Patient  will be discharging home with family.

## 2016-03-04 NOTE — Care Management Note (Signed)
Case Management Note  Patient Details  Name: Patrick SCHEIB Sr. MRN: [id number] Date of Birth: Jul 31, 1945  Subjective/Objective: 70 y/o m admitted w/hemoptysis. From home.                   Action/Plan:d/c home.   Expected Discharge Date:   (unknown)               Expected Discharge Plan:  Home/Self Care  In-House Referral:     Discharge planning Services  CM Consult  Post Acute Care Choice:    Choice offered to:     DME Arranged:    DME Agency:     HH Arranged:    [place] Agency:     Status of Service:  Completed, signed off  If discussed at [employer] of Stay Meetings, dates discussed:    Additional Comments:  Dessa Phi, RN 03/04/2016, 1:13 PM

## 2016-03-05 ENCOUNTER — Telehealth: Payer: Self-pay | Admitting: *Deleted

## 2016-03-05 NOTE — Telephone Encounter (Addendum)
Received call from pt stating that he was released from hosp yesterday & is confused about his meds.  He reports that he usually takes xanax bid, calcium bid, Prozac daily, & Advair bid.  He states the d/c RN (who was new) wrote on his d/c paper to take xanax at hs, calcium at dinner, Advair at hs & to stop Prozac.  Doxycycline was added & was told to take at hs.  Reviewed D/C summary from hospital & doxy is supposed to be bid.  The only med noted to be d/c'd was the novolumab IV.  Requested pt bring D/C paper with him to next visit with Dr [name] 03/09/16 but to continue meds mentioned as previously taking & per MD orders.  Note to Dr Mohamed/Pod RN

## 2016-03-08 ENCOUNTER — Other Ambulatory Visit: Payer: Self-pay | Admitting: Family Medicine

## 2016-03-08 ENCOUNTER — Ambulatory Visit (HOSPITAL_COMMUNITY)
Admission: RE | Admit: 2016-03-08 | Discharge: 2016-03-08 | Disposition: A | Discharge status: Home / Self Care | Payer: Medicare Other | Source: Ambulatory Visit | Attending: Internal Medicine | Admitting: Internal Medicine

## 2016-03-08 DIAGNOSIS — M5136 Other intervertebral disc degeneration, lumbar region: Secondary | ICD-10-CM | POA: Insufficient documentation

## 2016-03-08 DIAGNOSIS — C642 Malignant neoplasm of left kidney, except renal pelvis: Secondary | ICD-10-CM

## 2016-03-08 DIAGNOSIS — K429 Umbilical hernia without obstruction or gangrene: Secondary | ICD-10-CM | POA: Insufficient documentation

## 2016-03-08 DIAGNOSIS — R911 Solitary pulmonary nodule: Secondary | ICD-10-CM | POA: Insufficient documentation

## 2016-03-08 DIAGNOSIS — C649 Malignant neoplasm of unspecified kidney, except renal pelvis: Secondary | ICD-10-CM | POA: Diagnosis present

## 2016-03-08 DIAGNOSIS — E279 Disorder of adrenal gland, unspecified: Secondary | ICD-10-CM | POA: Insufficient documentation

## 2016-03-08 DIAGNOSIS — I700 Atherosclerosis of aorta: Secondary | ICD-10-CM | POA: Diagnosis not present

## 2016-03-08 DIAGNOSIS — R599 Enlarged lymph nodes, unspecified: Secondary | ICD-10-CM | POA: Diagnosis not present

## 2016-03-08 DIAGNOSIS — Z905 Acquired absence of kidney: Secondary | ICD-10-CM | POA: Insufficient documentation

## 2016-03-08 DIAGNOSIS — Z5112 Encounter for antineoplastic immunotherapy: Secondary | ICD-10-CM

## 2016-03-08 MED ORDER — IOPAMIDOL (ISOVUE-300) INJECTION 61%
30.0000 mL | Freq: Once | INTRAVENOUS | Status: AC | PRN
  Administered 2016-03-08: 30 mL via ORAL

## 2016-03-08 MED ORDER — IOPAMIDOL (ISOVUE-300) INJECTION 61%
100.0000 mL | Freq: Once | INTRAVENOUS | Status: AC | PRN
  Administered 2016-03-08: 75 mL via INTRAVENOUS

## 2016-03-08 NOTE — Telephone Encounter (Signed)
Please review. KW 

## 2016-03-08 NOTE — Telephone Encounter (Signed)
Pt needs refill on his albuterol 108 (90 base) inhaler.  He uses [facility].  He has an appt today for a ct scan at the hospital but said you can leave a message if he don't answer if you need to talk to him.  Thank sTeri

## 2016-03-09 ENCOUNTER — Ambulatory Visit: Payer: Medicare Other

## 2016-03-09 ENCOUNTER — Telehealth: Payer: Self-pay | Admitting: Internal Medicine

## 2016-03-09 ENCOUNTER — Encounter: Payer: Self-pay | Admitting: Internal Medicine

## 2016-03-09 ENCOUNTER — Other Ambulatory Visit (HOSPITAL_BASED_OUTPATIENT_CLINIC_OR_DEPARTMENT_OTHER): Payer: Medicare Other

## 2016-03-09 ENCOUNTER — Ambulatory Visit (HOSPITAL_BASED_OUTPATIENT_CLINIC_OR_DEPARTMENT_OTHER): Payer: Medicare Other | Admitting: Internal Medicine

## 2016-03-09 ENCOUNTER — Telehealth: Payer: Self-pay | Admitting: Medical Oncology

## 2016-03-09 VITALS — BP 126/57 | HR 89 | Temp 98.0°F | Resp 19 | Ht 71.0 in | Wt 236.9 lb

## 2016-03-09 DIAGNOSIS — D649 Anemia, unspecified: Secondary | ICD-10-CM

## 2016-03-09 DIAGNOSIS — C771 Secondary and unspecified malignant neoplasm of intrathoracic lymph nodes: Secondary | ICD-10-CM | POA: Diagnosis not present

## 2016-03-09 DIAGNOSIS — C7931 Secondary malignant neoplasm of brain: Secondary | ICD-10-CM

## 2016-03-09 DIAGNOSIS — C642 Malignant neoplasm of left kidney, except renal pelvis: Secondary | ICD-10-CM

## 2016-03-09 DIAGNOSIS — R042 Hemoptysis: Secondary | ICD-10-CM

## 2016-03-09 DIAGNOSIS — K921 Melena: Secondary | ICD-10-CM

## 2016-03-09 DIAGNOSIS — C7889 Secondary malignant neoplasm of other digestive organs: Secondary | ICD-10-CM

## 2016-03-09 DIAGNOSIS — R918 Other nonspecific abnormal finding of lung field: Secondary | ICD-10-CM

## 2016-03-09 DIAGNOSIS — C7971 Secondary malignant neoplasm of right adrenal gland: Secondary | ICD-10-CM

## 2016-03-09 LAB — COMPREHENSIVE METABOLIC PANEL
ALT: 8 U/L (ref 0–55)
ANION GAP: 12 meq/L — AB (ref 3–11)
AST: 13 U/L (ref 5–34)
Albumin: 3.3 g/dL — ABNORMAL LOW (ref 3.5–5.0)
Alkaline Phosphatase: 100 U/L (ref 40–150)
BILIRUBIN TOTAL: 0.42 mg/dL (ref 0.20–1.20)
BUN: 21.6 mg/dL (ref 7.0–26.0)
CALCIUM: 10.2 mg/dL (ref 8.4–10.4)
CO2: 23 mEq/L (ref 22–29)
CREATININE: 1.2 mg/dL (ref 0.7–1.3)
Chloride: 102 mEq/L (ref 98–109)
EGFR: 59 mL/min/{1.73_m2} — ABNORMAL LOW (ref >90)
Glucose: 193 mg/dl — ABNORMAL HIGH (ref 70–140)
Potassium: 4.4 mEq/L (ref 3.5–5.1)
Sodium: 138 mEq/L (ref 136–145)
TOTAL PROTEIN: 7.8 g/dL (ref 6.4–8.3)

## 2016-03-09 LAB — CBC WITH DIFFERENTIAL/PLATELET
BASO%: 1.2 % (ref 0.0–2.0)
Basophils Absolute: 0.1 10*3/uL (ref 0.0–0.1)
EOS%: 5.1 % (ref 0.0–7.0)
Eosinophils Absolute: 0.6 10*3/uL — ABNORMAL HIGH (ref 0.0–0.5)
HEMATOCRIT: 32.1 % — AB (ref 38.4–49.9)
HGB: 10.5 g/dL — ABNORMAL LOW (ref 13.0–17.1)
LYMPH#: 1.6 10*3/uL (ref 0.9–3.3)
LYMPH%: 13.3 % — ABNORMAL LOW (ref 14.0–49.0)
MCH: 30.1 pg (ref 27.2–33.4)
MCHC: 32.6 g/dL (ref 32.0–36.0)
MCV: 92.5 fL (ref 79.3–98.0)
MONO#: 0.9 10*3/uL (ref 0.1–0.9)
MONO%: 7.9 % (ref 0.0–14.0)
NEUT%: 72.5 % (ref 39.0–75.0)
NEUTROS ABS: 8.7 10*3/uL — AB (ref 1.5–6.5)
PLATELETS: 352 10*3/uL (ref 140–400)
RBC: 3.47 10*6/uL — ABNORMAL LOW (ref 4.20–5.82)
RDW: 14.2 % (ref 11.0–14.6)
WBC: 12 10*3/uL — ABNORMAL HIGH (ref 4.0–10.3)

## 2016-03-09 NOTE — Telephone Encounter (Signed)
F/u information re clinic visit today.

## 2016-03-09 NOTE — Telephone Encounter (Signed)
All previously scheduled Labs, M.D & Chemo appointments was cancelled per M.D. (verbal orders) today. Appointment was scheduled with Dr. Isidore Moos for Wednesday, 03/17/16, per referral request 03/09/16. Patient is aware.  Appointments scheduled per 03/09/16 los.  Copy of  AV[facility] report and appointment schedule was given to patient, per 03/09/16 los.

## 2016-03-09 NOTE — Progress Notes (Signed)
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 This was initially diagnosed as a stage III status post left nephrectomy as well as left adrenalectomy on 03/06/2012. 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  PRIOR THERAPY:  1) Stereotactic radiotherapy to the solitary brain lesion scheduled for 08/20/2015. 2)  Votrient 800 mg by mouth daily started 08/08/2015. His dose will be reduced to Votrient 600 mg by mouth daily starting 10/09/2015. 3) Nivolumab 240 MG IV every 2 weeks. First dose 01/13/2016. Status post 4 cycles, discontinued secondary to disease progression.  CURRENT THERAPY: Palliative radiotherapy to the enlarging left lower lobe lung mass.  INTERVAL HISTORY: Patrick Breed Sr. 70 y.o. male returns to the clinic today for follow-up visit. The patient is very anxious today. He was recently admitted to [facility] with hemoptysis and imaging studies showed evidence for disease progression. He was started on treatment with doxycycline and has 2 more days to finish this course. His hemoptysis better. He also complains of black tarry stool the last few days but he is currently on oral iron tablets. He denied having any significant chest pain or shortness of breath. He continues to have cough. He has no significant weight loss or night sweats. He has no fever or chills. The patient had repeat CT scan of the chest, abdomen and pelvis performed recently and he is here for evaluation and discussion of his scan results.  MEDICAL HISTORY: Past Medical History:  Diagnosis Date  . AAA (abdominal aortic  aneurysm) ([place])   . Anxiety   . Asthma   . Cancer [facility])    Renal Cell- 2013; Lung 2017  . Chronic kidney disease    Renal Cell Adenocarcinoma  . Chronic kidney disease    Left Nephrectomy  . COPD (chronic obstructive pulmonary disease) ([place])   . Coronary artery disease   . Dehydration 12/16/2015  . Depression   . Diabetes mellitus without complication ([place])    Type II  . Eczema   . Encounter for antineoplastic chemotherapy 08/19/2015  . Encounter for antineoplastic immunotherapy 01/06/2016  . GERD (gastroesophageal reflux disease)   . Headache   . Heart murmur   . HPTH (hyperparathyroidism) ([place])   . Hx of radiation therapy 08/20/15   Right Temporal brain  . Hyperlipidemia   . Hyperparathyroidism ([place])   . Hypertension   . Hypertension 08/19/2015  . Hypothyroidism   . Malignant neoplasm of left kidney ([place]) 02/09/2013  . Neuropathy ([place])   . Shortness of breath dyspnea   . Sleep apnea    CPAP  . Thoracic aortic aneurysm ([place]) 2017    ALLERGIES:  is allergic to amoxicillin; codeine; penicillins; and ace inhibitors.  MEDICATIONS:  Current Outpatient Prescriptions  Medication Sig Dispense Refill  . ALPRAZolam (XANAX) 0.5 MG tablet Take 0.5 mg by mouth 2 (two) times daily.    Marland Kitchen amLODipine (NORVASC) 10 MG tablet Take 10 mg by mouth daily.    . calcium carbonate (OSCAL) 1500 (600 Ca) MG TABS tablet Take 600 mg of elemental calcium by mouth 2 (two) times daily with a meal.    . cholecalciferol (VITAMIN  D) 1000 units tablet Take 1,000 Units by mouth at bedtime.    . docusate sodium (COLACE) 100 MG capsule Take 100 mg by mouth 2 (two) times daily.    Marland Kitchen doxycycline (VIBRA-TABS) 100 MG tablet Take 1 tablet (100 mg total) by mouth 2 (two) times daily. 20 tablet 0  . feeding supplement, ENSURE ENLIVE, (ENSURE ENLIVE) LIQD Take 237 mLs by mouth 2 (two) times daily between meals. 237 mL 12  . FeFum-FePoly-FA-B Cmp-C-Biot (INTEGRA PLUS) CAPS Take 1 capsule by mouth every morning. 30  capsule 2  . FLUoxetine (PROZAC) 40 MG capsule Take 40 mg by mouth daily.    . Fluticasone-Salmeterol (ADVAIR) 100-50 MCG/DOSE AEPB Inhale 1 puff into the lungs 2 (two) times daily.    . meclizine (ANTIVERT) 25 MG tablet Take 25 mg by mouth 3 (three) times daily as needed for dizziness.     . mometasone (ELOCON) 0.1 % cream Apply 1 application topically 2 (two) times daily as needed (for itching).    Marland Kitchen PROAIR HFA 108 (90 Base) MCG/ACT inhaler INHALE 1 OR 2 PUFFS AS NEEDED 8.5 g 11  . aspirin EC 81 MG tablet Take 1 tablet (81 mg total) by mouth at bedtime. (Patient not taking: Reported on 03/09/2016)    . prochlorperazine (COMPAZINE) 10 MG tablet Take 10 mg by mouth every 6 (six) hours as needed for nausea or vomiting.   0   No current facility-administered medications for this visit.     SURGICAL HISTORY:  Past Surgical History:  Procedure Laterality Date  . BACK SURGERY     compressed vertebrae-put in a plate to seprate the spaces  . CARDIAC CATHETERIZATION    . CERVICAL FUSION    . CORONARY ARTERY BYPASS GRAFT     x 5  . ENDOBRONCHIAL ULTRASOUND N/A 06/30/2015   Procedure: ENDOHIAL ULTRASOUND;  Surgeon: Flora Lipps, MD;  Location: [facility] ORS;  Service: Cardiopulmonary;  Laterality: N/A;  . FRACTURE SURGERY     left arm repair  . KNEE SURGERY     left knee repair  . PARATHYROIDECTOMY  12/17/09  . VIDEO BRONCHOSCOPY WITH ENDOBRONCHIAL ULTRASOUND N/A 07/09/2015   Procedure: VIDEO BRONCHOSCOPY WITH ENDOBRONCHIAL ULTRASOUND;  Surgeon: Ivin Poot, MD;  Location: [facility] OR;  Service: Thoracic;  Laterality: N/A;    REVIEW OF SYSTEMS:  Constitutional: positive for fatigue Eyes: negative Ears, nose, mouth, throat, and face: negative Respiratory: positive for cough and hemoptysis Cardiovascular: negative Gastrointestinal: positive for melena Genitourinary:negative Integument/breast: negative Hematologic/lymphatic: negative Musculoskeletal:negative Neurological:  negative Behavioral/Psych: negative Endocrine: negative Allergic/Immunologic: negative   PHYSICAL EXAMINATION: General appearance: alert, cooperative, fatigued and no distress Head: Normocephalic, without obvious abnormality, atraumatic Neck: no adenopathy, no JVD, supple, symmetrical, trachea midline and thyroid not enlarged, symmetric, no tenderness/mass/nodules Lymph nodes: Cervical, supraclavicular, and axillary nodes normal. Resp: clear to auscultation bilaterally Back: symmetric, no curvature. ROM normal. No CVA tenderness. Cardio: regular rate and rhythm, S1, S2 normal, no murmur, click, rub or gallop GI: soft, non-tender; bowel sounds normal; no masses,  no organomegaly Extremities: extremities normal, atraumatic, no cyanosis or edema Neurologic: Alert and oriented X 3, normal strength and tone. Normal symmetric reflexes. Normal coordination and gait  ECOG PERFORMANCE STATUS: 1 - Symptomatic but completely ambulatory  Blood pressure (!) 126/57, pulse 89, temperature 98 F (36.7 C), temperature source Oral, resp. rate 19, height '5\' 11"'$  (1.803 m), weight 236 lb 14.4 oz (107.5 kg), SpO2 97 %.  LABORATORY DATA: Lab Results  Component Value Date   WBC  12.0 (H) 03/09/2016   HGB 10.5 (L) 03/09/2016   HCT 32.1 (L) 03/09/2016   MCV 92.5 03/09/2016   PLT 352 03/09/2016      Chemistry      Component Value Date/Time   NA 138 03/09/2016 0749   K 4.4 03/09/2016 0749   CL 103 03/04/2016 0444   CL 103 01/19/2012 0834   CO2 23 03/09/2016 0749   BUN 21.6 03/09/2016 0749   CREATININE 1.2 03/09/2016 0749   GLU 159 11/21/2013      Component Value Date/Time   CALCIUM 10.2 03/09/2016 0749   ALKPHOS 100 03/09/2016 0749   AST 13 03/09/2016 0749   ALT 8 03/09/2016 0749   BILITOT 0.42 03/09/2016 0749       RADIOGRAPHIC STUDIES: Ct Chest W Contrast  Result Date: 03/08/2016 CLINICAL DATA:  Renal cell carcinoma. EXAM: CT CHEST, ABDOMEN, AND PELVIS WITH CONTRAST TECHNIQUE:  Multidetector CT imaging of the chest, abdomen and pelvis was performed following the standard protocol during bolus administration of intravenous contrast. CONTRAST:  82m ISOVUE-300 IOPAMIDOL (ISOVUE-300) INJECTION 61%, 765mISOVUE-300 IOPAMIDOL (ISOVUE-300) INJECTION 61% COMPARISON:  CT chest 01/02/2016 CT abdomen and pelvis 12/22/2015 FINDINGS: CT CHEST FINDINGS Cardiovascular: Normal heart size. Previous median sternotomy and CABG procedure. Aortic atherosclerosis noted. There is no pericardial effusion. Mediastinum/Nodes: The trachea appears patent and midline. Unremarkable appearance of the esophagus. Left paratracheal lymph node measures 3 cm short axis, image 26 of series 2. Previously 2 cm. Adjacent AP window lymph node has a short axis of 2.8 cm, image 26 of series 2. Previously 2.5 cm. Tumor is now seen extending into the left mainstem bronchus region, image 95 of series 6. Index sub- carinal lymph node measures 2.9 cm, image 34 of series 2. Previously 1.9 cm index left hilar node measures 1.8 cm, image 38 of series 2. Previously 1.5 cm. Lungs/Pleura: No pleural effusion identified. Left lower lobe lung mass measures 4.7 x 5.4 cm, image 84 of series 5. Previously 2.9 x 4.8 cm. Tumor extends into the left hilar region and there is a new endobronchial component to the lingula, image 84 of series 5. Increase in size of lingular nodule which measures 11 mm, image 87 of series 5. Previously 8 mm. Perifissural nodule within the right lower lobe measures 9 mm, image 72 of series 5. Previously 4 mm. There is a small right upper lobe lung nodule which measures 7 mm, image 44 of series 5. Previously 4 mm. Left apical nodule measures 9 mm, image 29 of series 5. Previously 5 mm. Musculoskeletal: No chest wall mass or suspicious bone lesions identified. CT ABDOMEN PELVIS FINDINGS Hepatobiliary: Stable small low density structure within the medial segment of left lobe measuring 4 mm. The gallbladder is normal. No  biliary dilatation. Pancreas: Solid enhancing lesion within the tail of pancreas is new measuring 2.4 cm, image 74 of series 2. No pancreatic duct dilatation. No inflammation identified. Spleen: Normal in size without focal abnormality. Adrenals/Urinary Tract: Nodule in the right adrenal gland measures 2.7 cm, image 64 of series 2. New from 12/22/2015. There is a new nodule within the left adrenal gland which measures 1.4 cm, image 70 of series 2. The right kidney is normal. No mass or hydronephrosis. Status post left nephrectomy. Stomach/Bowel: Stomach is within normal limits. Appendix appears normal. No evidence of bowel wall thickening, distention, or inflammatory changes. Vascular/Lymphatic: Aortic atherosclerosis noted. Enlarged periaortic lymph node Measures 1.3 cm, image 79 of series 2. New from previous exam. No pelvic  or inguinal adenopathy. a Reproductive: Prostate is unremarkable. Other: There is a small periumbilical hernia which contains a nonobstructed loop of small bowel, image 97 of series 2. Musculoskeletal: No aggressive lytic or sclerotic bone lesions. Degenerative disc disease is identified at L2-3. IMPRESSION: 1. Interval progression of disease. 2. When compared with 01/02/2016 there has been increase of thoracic nodal metastasis and pulmonary nodules/masses within both lungs. Tumor within knee left lower lobe/left hilar region can now be seen extending into the left mainstem bronchus. 3. When compared with the previous CT of the abdomen and pelvis from 12/22/2015 there is new metastasis to the tail of pancreas and right adrenal gland. There is also a new enlarged periaortic lymph node. Electronically Signed   By: Kerby Moors M.D.   On: 03/08/2016 15:37   Ct Angio Chest Pe W And/or Wo Contrast  Result Date: 03/02/2016 CLINICAL DATA:  History of renal cell adenocarcinoma with cold for 2-3 weeks, known prior lung mass, initial encounter EXAM: CT ANGIOGRAPHY CHEST WITH CONTRAST TECHNIQUE:  Multidetector CT imaging of the chest was performed using the standard protocol during bolus administration of intravenous contrast. Multiplanar CT image reconstructions and MIPs were obtained to evaluate the vascular anatomy. CONTRAST:  90 mL Isovue 370. COMPARISON:  01/02/2016 FINDINGS: Cardiovascular: The thoracic aorta shows atherosclerotic calcifications and changes of prior coronary bypass grafting similar to that seen on the prior exam. Dilatation of the ascending aorta is again identified to approximately 5 cm. This is stable when compared with the prior exam. Cardiac structures are within normal limits. Heavy coronary calcifications are noted. The pulmonary artery demonstrates a normal branching pattern. Some attenuation of the branches on the left is noted due to mass effect although no definitive pulmonary emboli are seen. Similar findings are noted on the right but to a lesser degree. Mediastinum/Nodes: Better visualized due to the administration of IV contrast is a large conglomeration of lymphadenopathy within the AP window measuring approximately 5.6 x 5.9 cm. This shows some increase in bulk when compared with the prior exam and some central decreased attenuation suggestive of necrosis. Bulky left hilar lymph nodes are noted also increased in size from the prior exam. The largest of these measures approximately 2.3 cm in short axis. The subcarinal adenopathy measures 3.4 cm in short axis increased from 1.9 cm on the prior exam. Increasing right hilar adenopathy is noted. The largest area measures approximately 2 cm in short axis best seen on image number 155 of series 10. Lungs/Pleura: The nodule seen in the left upper lobe has increased in size now measuring approximately 6 mm. The lingular nodule seen previously now measures approximately 10 mm increased in size from the prior exam. Previously seen left lower lobe mass has also increased in size now measuring approximately 5.5 by 5.1 cm. The  predominant growth is noted from right to left when compared with the prior exam. A few smaller nodules are noted within the left lower lobe also consistent with progression of disease. The right lung remains clear with the exception of enlargement along the major fissure best seen on image number 43 of series 11 now measuring almost 10 mm increased from 4 mm. Upper Abdomen: Visualized upper abdomen shows changes consistent with prior left nephrectomy. No new focal abnormality is seen. Musculoskeletal: Postsurgical changes are noted in the lower cervical spine. No compression deformities are seen. Mild degenerative changes noted within the thoracic spine. No definitive lytic or sclerotic lesion is identified to suggest metastatic disease. Intramuscular lipoma  is noted within the left shoulder girdle. The T7 sclerotic lesions seen previously is stable and again likely represents a bone island. Review of the MIP images confirms the above findings. IMPRESSION: Significant progression in the degree of mediastinal and hilar lymphadenopathy bilaterally. Additionally progression in the dominant left lower lobe mass lesion as well as multiple scattered nodules throughout both lungs. This is consistent with progression of metastatic disease. No evidence of pulmonary emboli although there are findings of attenuation/compression of the pulmonary arterial branches particularly on the left secondary to hilar adenopathy and mediastinal adenopathy Stable ascending aortic dilatation. Ascending thoracic aortic aneurysm. Recommend semi-annual imaging followup by CTA or MRA and referral to cardiothoracic surgery if not already obtained. This recommendation follows 2010 ACCF/AHA/AATS/ACR/ASA/SCA/SCAI/SIR/STS/SVM Guidelines for the Diagnosis and Management of Patients With Thoracic Aortic Disease. Circulation. 2010; 121: V035-K093 Electronically Signed   By: Inez Catalina M.D.   On: 03/02/2016 17:33   Ct Abdomen Pelvis W  Contrast  Result Date: 03/08/2016 CLINICAL DATA:  Renal cell carcinoma. EXAM: CT CHEST, ABDOMEN, AND PELVIS WITH CONTRAST TECHNIQUE: Multidetector CT imaging of the chest, abdomen and pelvis was performed following the standard protocol during bolus administration of intravenous contrast. CONTRAST:  3m ISOVUE-300 IOPAMIDOL (ISOVUE-300) INJECTION 61%, 745mISOVUE-300 IOPAMIDOL (ISOVUE-300) INJECTION 61% COMPARISON:  CT chest 01/02/2016 CT abdomen and pelvis 12/22/2015 FINDINGS: CT CHEST FINDINGS Cardiovascular: Normal heart size. Previous median sternotomy and CABG procedure. Aortic atherosclerosis noted. There is no pericardial effusion. Mediastinum/Nodes: The trachea appears patent and midline. Unremarkable appearance of the esophagus. Left paratracheal lymph node measures 3 cm short axis, image 26 of series 2. Previously 2 cm. Adjacent AP window lymph node has a short axis of 2.8 cm, image 26 of series 2. Previously 2.5 cm. Tumor is now seen extending into the left mainstem bronchus region, image 95 of series 6. Index sub- carinal lymph node measures 2.9 cm, image 34 of series 2. Previously 1.9 cm index left hilar node measures 1.8 cm, image 38 of series 2. Previously 1.5 cm. Lungs/Pleura: No pleural effusion identified. Left lower lobe lung mass measures 4.7 x 5.4 cm, image 84 of series 5. Previously 2.9 x 4.8 cm. Tumor extends into the left hilar region and there is a new endobronchial component to the lingula, image 84 of series 5. Increase in size of lingular nodule which measures 11 mm, image 87 of series 5. Previously 8 mm. Perifissural nodule within the right lower lobe measures 9 mm, image 72 of series 5. Previously 4 mm. There is a small right upper lobe lung nodule which measures 7 mm, image 44 of series 5. Previously 4 mm. Left apical nodule measures 9 mm, image 29 of series 5. Previously 5 mm. Musculoskeletal: No chest wall mass or suspicious bone lesions identified. CT ABDOMEN PELVIS FINDINGS  Hepatobiliary: Stable small low density structure within the medial segment of left lobe measuring 4 mm. The gallbladder is normal. No biliary dilatation. Pancreas: Solid enhancing lesion within the tail of pancreas is new measuring 2.4 cm, image 74 of series 2. No pancreatic duct dilatation. No inflammation identified. Spleen: Normal in size without focal abnormality. Adrenals/Urinary Tract: Nodule in the right adrenal gland measures 2.7 cm, image 64 of series 2. New from 12/22/2015. There is a new nodule within the left adrenal gland which measures 1.4 cm, image 70 of series 2. The right kidney is normal. No mass or hydronephrosis. Status post left nephrectomy. Stomach/Bowel: Stomach is within normal limits. Appendix appears normal. No evidence of bowel  wall thickening, distention, or inflammatory changes. Vascular/Lymphatic: Aortic atherosclerosis noted. Enlarged periaortic lymph node Measures 1.3 cm, image 79 of series 2. New from previous exam. No pelvic or inguinal adenopathy. a Reproductive: Prostate is unremarkable. Other: There is a small periumbilical hernia which contains a nonobstructed loop of small bowel, image 97 of series 2. Musculoskeletal: No aggressive lytic or sclerotic bone lesions. Degenerative disc disease is identified at L2-3. IMPRESSION: 1. Interval progression of disease. 2. When compared with 01/02/2016 there has been increase of thoracic nodal metastasis and pulmonary nodules/masses within both lungs. Tumor within knee left lower lobe/left hilar region can now be seen extending into the left mainstem bronchus. 3. When compared with the previous CT of the abdomen and pelvis from 12/22/2015 there is new metastasis to the tail of pancreas and right adrenal gland. There is also a new enlarged periaortic lymph node. Electronically Signed   By: Kerby Moors M.D.   On: 03/08/2016 15:37    ASSESSMENT AND PLAN: This is a very pleasant 70 years old white male with metastatic renal cell  carcinoma currently on treatment with Votrient 800 mg by mouth daily status post 2 months of treatment and he has been tolerating his treatment well except for persistent fatigue. His dose was a change it to Votrient 600 mg by mouth daily since 10/09/2015 and he was tolerating it much better but recently started having more fatigue and diarrhea. His treatment has been on hold recently. The recent CT scan of the chest, abdomen and pelvis showed evidence for disease progression of the thoracic adenopathy as well as the left lower lobe lung mass. He is currently on treatment with immunotherapy with Nivolumab status post 4 cycles. He tolerated his treatment well but unfortunately the recent CT scan of the chest, abdomen and pelvis showed evidence for disease progression with increase in the thoracic nodal metastasis as well as pulmonary nodules and masses within post lungs. There was also new metastases in the tail of the pancreas and right adrenal gland as well as new enlarged periaortic lymph node.  I had a lengthy discussion with the patient today about his condition. I recommended for him to see Dr. Isidore Moos for consideration of palliative radiotherapy to the enlarging left lower lobe lung mass for concern of the recurrent hemoptysis. After completion of his palliative radiotherapy, I may consider the patient for treatment with another tyrosine kinase inhibitor, like Sutent. For the anemia and black tarry stool noticed recently, I will check stool for Hemoccult. I recommended for the patient to continue on Integra +1 capsule by mouth daily. I will see him back for follow-up visit in 3 weeks for reevaluation and more detailed discussion of his treatment options after the palliative radiotherapy. The patient was advised to call immediately if he has any concerning symptoms in the interval. The patient voices understanding of current disease status and treatment options and is in agreement with the current care  plan.  All questions were answered. The patient knows to call the clinic with any problems, questions or concerns. We can certainly see the patient much sooner if necessary.  Disclaimer: This note was dictated with voice recognition software. Similar sounding words can inadvertently be transcribed and may not be corrected upon review.

## 2016-03-09 NOTE — Telephone Encounter (Signed)
Reviewed with dtr in law. Note to [place].

## 2016-03-11 ENCOUNTER — Telehealth: Payer: Self-pay | Admitting: *Deleted

## 2016-03-11 NOTE — Telephone Encounter (Signed)
Returned call to [place], unable to reach or leave message.

## 2016-03-12 ENCOUNTER — Other Ambulatory Visit: Payer: Self-pay | Admitting: Internal Medicine

## 2016-03-12 ENCOUNTER — Ambulatory Visit (INDEPENDENT_AMBULATORY_CARE_PROVIDER_SITE_OTHER): Payer: Medicare Other | Admitting: Physician Assistant

## 2016-03-12 ENCOUNTER — Encounter: Payer: Self-pay | Admitting: Physician Assistant

## 2016-03-12 ENCOUNTER — Ambulatory Visit: Payer: Medicare Other

## 2016-03-12 VITALS — BP 116/56 | HR 94 | Temp 97.6°F | Resp 16 | Wt 234.6 lb

## 2016-03-12 DIAGNOSIS — C649 Malignant neoplasm of unspecified kidney, except renal pelvis: Secondary | ICD-10-CM

## 2016-03-12 DIAGNOSIS — J40 Bronchitis, not specified as acute or chronic: Secondary | ICD-10-CM | POA: Diagnosis not present

## 2016-03-12 MED ORDER — LEVALBUTEROL HCL 1.25 MG/3ML IN NEBU: 1.2500 mg | INHALATION_SOLUTION | Freq: Once | RESPIRATORY_TRACT | 0 refills | Status: DC

## 2016-03-12 MED ORDER — LEVALBUTEROL HCL 1.25 MG/0.5ML IN NEBU: 1.2500 mg | INHALATION_SOLUTION | Freq: Once | RESPIRATORY_TRACT | Status: DC

## 2016-03-12 MED ORDER — LEVALBUTEROL HCL 1.25 MG/0.5ML IN NEBU: 1.2500 mg | INHALATION_SOLUTION | Freq: Once | RESPIRATORY_TRACT | Status: AC

## 2016-03-12 MED ORDER — LEVALBUTEROL HCL 1.25 MG/0.5ML IN NEBU
1.2500 mg | INHALATION_SOLUTION | RESPIRATORY_TRACT | Status: DC
Start: 2016-03-12 — End: 2016-03-12

## 2016-03-12 MED ORDER — LEVOFLOXACIN 500 MG PO TABS: 500.0000 mg | ORAL_TABLET | Freq: Every day | ORAL | 0 refills | Status: DC

## 2016-03-12 MED ORDER — LEVALBUTEROL HCL 1.25 MG/3ML IN NEBU: 1.2500 mg | INHALATION_SOLUTION | RESPIRATORY_TRACT | 12 refills | Status: DC | PRN

## 2016-03-12 NOTE — Progress Notes (Signed)
Patient: [name]. Male    DOB: 19-Feb-1946   70 y.o.   MRN: [id number] Visit Date: 03/12/2016  Today's Provider: Mar Daring, PA-C   Chief Complaint  Patient presents with  . URI   Subjective:    HPI Patient came in today for Influenza vaccine. He reports that he still has the cough, symptoms: SOB, wheezing and chest congestion. He reports that he was seen 2 weeks ago for URI symptoms and was prescribed doxycycline, mucinex and Delsym. He was admitted to the hospital, per patient he was given the same medication (doxycycline) and he still has some left. He does his cpap machine and it helps him with his sleep. He reports that the  worst is the coughing spells that he gets. He has been doing his inhalers and Delsym for the cough with only minimal relief..    Allergies  Allergen Reactions  . Amoxicillin Anaphylaxis, Rash and Other (See Comments)    Has patient had a PCN reaction causing immediate rash, facial/tongue/throat swelling, SOB or lightheadedness with hypotension: Yes Has patient had a PCN reaction causing severe rash involving mucus membranes or skin necrosis: No Has patient had a PCN reaction that required hospitalization No Has patient had a PCN reaction occurring within the last 10 years: No If all of the above answers are "NO", then may proceed with Cephalosporin use.  . Codeine Anaphylaxis, Diarrhea and Rash  . Penicillins Anaphylaxis, Rash and Other (See Comments)    Has patient had a PCN reaction causing immediate rash, facial/tongue/throat swelling, SOB or lightheadedness with hypotension: Yes Has patient had a PCN reaction causing severe rash involving mucus membranes or skin necrosis: No Has patient had a PCN reaction that required hospitalization No Has patient had a PCN reaction occurring within the last 10 years: No If all of the above answers are "NO", then may proceed with Cephalosporin use.  . Ace Inhibitors Other (See Comments) and  Cough    Reaction:  Wheezing      Current Outpatient Prescriptions:  .  ALPRAZolam (XANAX) 0.5 MG tablet, Take 0.5 mg by mouth 2 (two) times daily., Disp: , Rfl:  .  amLODipine (NORVASC) 10 MG tablet, Take 10 mg by mouth daily., Disp: , Rfl:  .  calcium carbonate (OSCAL) 1500 (600 Ca) MG TABS tablet, Take 600 mg of elemental calcium by mouth 2 (two) times daily with a meal., Disp: , Rfl:  .  cholecalciferol (VITAMIN D) 1000 units tablet, Take 1,000 Units by mouth at bedtime., Disp: , Rfl:  .  docusate sodium (COLACE) 100 MG capsule, Take 100 mg by mouth 2 (two) times daily., Disp: , Rfl:  .  doxycycline (VIBRA-TABS) 100 MG tablet, Take 1 tablet (100 mg total) by mouth 2 (two) times daily., Disp: 20 tablet, Rfl: 0 .  feeding supplement, ENSURE ENLIVE, (ENSURE ENLIVE) LIQD, Take 237 mLs by mouth 2 (two) times daily between meals., Disp: 237 mL, Rfl: 12 .  FeFum-FePoly-FA-B Cmp-C-Biot (INTEGRA PLUS) CAPS, Take 1 capsule by mouth every morning., Disp: 30 capsule, Rfl: 2 .  FLUoxetine (PROZAC) 40 MG capsule, Take 40 mg by mouth daily., Disp: , Rfl:  .  Fluticasone-Salmeterol (ADVAIR) 100-50 MCG/DOSE AEPB, Inhale 1 puff into the lungs 2 (two) times daily., Disp: , Rfl:  .  meclizine (ANTIVERT) 25 MG tablet, Take 25 mg by mouth 3 (three) times daily as needed for dizziness. , Disp: , Rfl:  .  mometasone (ELOCON) 0.1 %  cream, Apply 1 application topically 2 (two) times daily as needed (for itching)., Disp: , Rfl:  .  PROAIR HFA 108 (90 Base) MCG/ACT inhaler, INHALE 1 OR 2 PUFFS AS NEEDED, Disp: 8.5 g, Rfl: 11 .  prochlorperazine (COMPAZINE) 10 MG tablet, Take 10 mg by mouth every 6 (six) hours as needed for nausea or vomiting. , Disp: , Rfl: 0 .  aspirin EC 81 MG tablet, Take 1 tablet (81 mg total) by mouth at bedtime. (Patient not taking: Reported on 03/12/2016), Disp: , Rfl:   Review of Systems  Constitutional: Positive for fatigue. Negative for fever.  HENT: Positive for congestion, postnasal  drip, rhinorrhea and sore throat (from cough patient thinks). Negative for ear pain, sinus pain, sinus pressure, sneezing and tinnitus.   Respiratory: Positive for cough, shortness of breath and wheezing. Negative for chest tightness.   Cardiovascular: Negative for chest pain, palpitations and leg swelling.  Gastrointestinal: Negative for abdominal pain and nausea.  Neurological: Negative for dizziness, light-headedness and headaches.    Social History  Substance Use Topics  . Smoking status: Former Smoker    Packs/day: 1.50    Years: 35.00    Types: Cigarettes    Quit date: 12/24/2001  . Smokeless tobacco: Never Used  . Alcohol use No   Objective:   BP (!) 116/56 (BP Location: Right Arm, Patient Position: Sitting, Cuff Size: Large)   Pulse 94   Temp 97.6 F (36.4 C) (Oral)   Resp 16   Wt 234 lb 9.6 oz (106.4 kg)   SpO2 95%   BMI 32.72 kg/m   Physical Exam  Constitutional: He appears well-developed and well-nourished. No distress.  HENT:  Head: Normocephalic and atraumatic.  Right Ear: Hearing, tympanic membrane, external ear and ear canal normal. Tympanic membrane is not erythematous and not bulging. No middle ear effusion.  Left Ear: Hearing, tympanic membrane, external ear and ear canal normal. Tympanic membrane is not erythematous and not bulging.  No middle ear effusion.  Nose: Mucosal edema and rhinorrhea present. Right sinus exhibits no maxillary sinus tenderness and no frontal sinus tenderness. Left sinus exhibits no maxillary sinus tenderness and no frontal sinus tenderness.  Mouth/Throat: Uvula is midline, oropharynx is clear and moist and mucous membranes are normal. No oropharyngeal exudate, posterior oropharyngeal edema or posterior oropharyngeal erythema.  Eyes: Conjunctivae and EOM are normal. Pupils are equal, round, and reactive to light. Right eye exhibits no discharge. Left eye exhibits no discharge.  Neck: Normal range of motion. Neck supple. No tracheal  deviation present. No Brudzinski's sign and no Kernig's sign noted. No thyromegaly present.  Cardiovascular: Normal rate, regular rhythm and normal heart sounds.  Exam reveals no gallop and no friction rub.   No murmur heard. Pulmonary/Chest: Effort normal. No stridor. No respiratory distress. He has decreased breath sounds. He has wheezes (throughout;inspiratory and expiratory). He has no rhonchi. He has no rales.  Wheezing was minimal after treatment and only expiratory  Lymphadenopathy:    He has no cervical adenopathy.  Skin: Skin is warm and dry. He is not diaphoretic.  Vitals reviewed.      Assessment & Plan:     1. Bronchitis Increased shortness of breath and worsening symptoms with noted left lower lung mass. Patient is to start palliative radiation on the left lower lung mass next week. On exam today he had inspiratory and expiratory wheezing throughout. He was given a Xopenex nebulizer treatment with marked improvement in shortness of breath symptoms and wheezing. Levaquin was  given as below for treatment. He is to discontinue doxycycline. Continue Advair and albuterol inhalers. He is to call the office if symptoms cell to improve or worsen. - levofloxacin (LEVAQUIN) 500 MG tablet; Take 1 tablet (500 mg total) by mouth daily.  Dispense: 10 tablet; Refill: 0 - levalbuterol (XOPENEX) nebulizer solution 1.25 mg; Take 1.25 mg by nebulization once.  2. 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 He is to start radiation therapy next week. He had came to the office today for influenza vaccination but was still symptomatic thus this was not given.       Mar Daring, PA-C  [facility]

## 2016-03-12 NOTE — Patient Instructions (Signed)

## 2016-03-14 NOTE — Progress Notes (Signed)
Histology and Location of Primary Cancer:  Renal Cell Carcinoma  Location of Symptomatic tumor: Left Lower Lobe Lung mass. He has recent return of hemoptysis.  He denies coughing up any blood at the time of this note. He has not had hemoptysis since being discharged from the hospital.   Past/Anticipated chemotherapy by medical oncology, if any:  Dr. Earlie Server 03/09/16 His dose was a change it to Votrient 600 mg by mouth daily since 10/09/2015 and he was tolerating it much better but recently started having more fatigue and diarrhea. His treatment has been on hold recently. The recent CT scan of the chest, abdomen and pelvis showed evidence for disease progression of the thoracic adenopathy as well as the left lower lobe lung mass. He is currently on treatment with immunotherapy with Nivolumab status post 4 cycles. He tolerated his treatment well but unfortunately the recent CT scan of the chest, abdomen and pelvis showed evidence for disease progression with increase in the thoracic nodal metastasis as well as pulmonary nodules and masses within post lungs. There was also new metastases in the tail of the pancreas and right adrenal gland as well as new enlarged periaortic lymph node.  I had a lengthy discussion with the patient today about his condition. I recommended for him to see Dr. Isidore Moos for consideration of palliative radiotherapy to the enlarging left lower lobe lung mass for concern of the recurrent hemoptysis. After completion of his palliative radiotherapy, I may consider the patient for treatment with another tyrosine kinase inhibitor, like Sutent.  Patient's main complaint related to his symptomatic tumor is: Hemoptysis. He denies any hemoptysis since being discharged from the hospital 03/02/16  Pain on a scale of 0-10 is: He denies pain  Ambulatory status? Walker? Wheelchair?: Ambulatory  SAFETY ISSUES:  Prior radiation? Yes Radiation treatment dates:   08/20/2015 Site/dose:    Right temporal brain 60m / 20 Gy in 1 fraction Beams/energy:  Stereotactic Radiosurgery, 4 DCA fields / 6FFF photons  Pacemaker/ICD? No  Possible current pregnancy? N/A  Is the patient on methotrexate? No  Additional Complaints / other details:  He has recently been treated for Bronchitis. It was not improving on an antibiotic and he was switched to Levaquin on 03/12/16, and reports improved symptoms.    BP 131/73   Pulse 88   Temp 98.2 F (36.8 C)   Ht '5\' 11"'$  (1.803 m)   Wt 232 lb 12.8 oz (105.6 kg)   SpO2 96% Comment: room air  BMI 32.47 kg/m

## 2016-03-17 ENCOUNTER — Ambulatory Visit
Admission: RE | Admit: 2016-03-17 | Discharge: 2016-03-17 | Disposition: A | Discharge status: Home / Self Care | Payer: Medicare Other | Source: Ambulatory Visit | Attending: Radiation Oncology | Admitting: Radiation Oncology

## 2016-03-17 ENCOUNTER — Encounter: Payer: Self-pay | Admitting: Internal Medicine

## 2016-03-17 ENCOUNTER — Encounter: Payer: Self-pay | Admitting: Radiation Oncology

## 2016-03-17 ENCOUNTER — Other Ambulatory Visit: Payer: Self-pay | Admitting: Internal Medicine

## 2016-03-17 DIAGNOSIS — Z87891 Personal history of nicotine dependence: Secondary | ICD-10-CM | POA: Diagnosis not present

## 2016-03-17 DIAGNOSIS — I129 Hypertensive chronic kidney disease with stage 1 through stage 4 chronic kidney disease, or unspecified chronic kidney disease: Secondary | ICD-10-CM | POA: Insufficient documentation

## 2016-03-17 DIAGNOSIS — R911 Solitary pulmonary nodule: Secondary | ICD-10-CM | POA: Insufficient documentation

## 2016-03-17 DIAGNOSIS — R042 Hemoptysis: Secondary | ICD-10-CM | POA: Diagnosis present

## 2016-03-17 DIAGNOSIS — I2510 Atherosclerotic heart disease of native coronary artery without angina pectoris: Secondary | ICD-10-CM | POA: Insufficient documentation

## 2016-03-17 DIAGNOSIS — K429 Umbilical hernia without obstruction or gangrene: Secondary | ICD-10-CM | POA: Insufficient documentation

## 2016-03-17 DIAGNOSIS — R590 Localized enlarged lymph nodes: Secondary | ICD-10-CM | POA: Insufficient documentation

## 2016-03-17 DIAGNOSIS — C7802 Secondary malignant neoplasm of left lung: Secondary | ICD-10-CM

## 2016-03-17 DIAGNOSIS — Z905 Acquired absence of kidney: Secondary | ICD-10-CM | POA: Diagnosis not present

## 2016-03-17 DIAGNOSIS — F419 Anxiety disorder, unspecified: Secondary | ICD-10-CM | POA: Insufficient documentation

## 2016-03-17 DIAGNOSIS — Z951 Presence of aortocoronary bypass graft: Secondary | ICD-10-CM | POA: Insufficient documentation

## 2016-03-17 DIAGNOSIS — F329 Major depressive disorder, single episode, unspecified: Secondary | ICD-10-CM | POA: Insufficient documentation

## 2016-03-17 DIAGNOSIS — K5900 Constipation, unspecified: Secondary | ICD-10-CM | POA: Diagnosis not present

## 2016-03-17 DIAGNOSIS — I712 Thoracic aortic aneurysm, without rupture: Secondary | ICD-10-CM | POA: Insufficient documentation

## 2016-03-17 DIAGNOSIS — G4730 Sleep apnea, unspecified: Secondary | ICD-10-CM | POA: Insufficient documentation

## 2016-03-17 DIAGNOSIS — K219 Gastro-esophageal reflux disease without esophagitis: Secondary | ICD-10-CM | POA: Diagnosis not present

## 2016-03-17 DIAGNOSIS — C642 Malignant neoplasm of left kidney, except renal pelvis: Secondary | ICD-10-CM | POA: Diagnosis present

## 2016-03-17 DIAGNOSIS — N189 Chronic kidney disease, unspecified: Secondary | ICD-10-CM | POA: Insufficient documentation

## 2016-03-17 DIAGNOSIS — Z7982 Long term (current) use of aspirin: Secondary | ICD-10-CM | POA: Diagnosis not present

## 2016-03-17 DIAGNOSIS — I700 Atherosclerosis of aorta: Secondary | ICD-10-CM | POA: Insufficient documentation

## 2016-03-17 DIAGNOSIS — E860 Dehydration: Secondary | ICD-10-CM | POA: Insufficient documentation

## 2016-03-17 DIAGNOSIS — K921 Melena: Secondary | ICD-10-CM | POA: Diagnosis not present

## 2016-03-17 DIAGNOSIS — E1122 Type 2 diabetes mellitus with diabetic chronic kidney disease: Secondary | ICD-10-CM | POA: Insufficient documentation

## 2016-03-17 DIAGNOSIS — E785 Hyperlipidemia, unspecified: Secondary | ICD-10-CM | POA: Insufficient documentation

## 2016-03-17 DIAGNOSIS — J45909 Unspecified asthma, uncomplicated: Secondary | ICD-10-CM | POA: Insufficient documentation

## 2016-03-17 DIAGNOSIS — M5136 Other intervertebral disc degeneration, lumbar region: Secondary | ICD-10-CM | POA: Insufficient documentation

## 2016-03-17 DIAGNOSIS — E039 Hypothyroidism, unspecified: Secondary | ICD-10-CM | POA: Insufficient documentation

## 2016-03-17 DIAGNOSIS — Z9889 Other specified postprocedural states: Secondary | ICD-10-CM | POA: Insufficient documentation

## 2016-03-17 DIAGNOSIS — E1140 Type 2 diabetes mellitus with diabetic neuropathy, unspecified: Secondary | ICD-10-CM | POA: Insufficient documentation

## 2016-03-17 DIAGNOSIS — J449 Chronic obstructive pulmonary disease, unspecified: Secondary | ICD-10-CM | POA: Diagnosis not present

## 2016-03-17 DIAGNOSIS — I10 Essential (primary) hypertension: Secondary | ICD-10-CM | POA: Insufficient documentation

## 2016-03-17 HISTORY — DX: Melena: K92.1

## 2016-03-17 LAB — FECAL OCCULT BLOOD, GUAIAC: Occult Blood: NEGATIVE

## 2016-03-17 NOTE — Progress Notes (Signed)
  Radiation Oncology         [phone] ________________________________  Name: Patrick Breed Sr. MRN: [id number]  Date: 03/17/2016  DOB: [birth year]  SIMULATION AND TREATMENT PLANNING NOTE  Outpatient  DIAGNOSIS:     ICD-9-CM ICD-10-CM   1. 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  Identity was confirmed.  All relevant records and images related to the planned course of therapy were reviewed.  The patient freely provided informed written consent to proceed with treatment after reviewing the details related to the planned course of therapy. The consent form was witnessed and verified by the simulation staff.    Then, the patient was set-up in a stable reproducible  supine position for radiation therapy.  CT images were obtained.  Surface markings were placed.  The CT images were loaded into the planning software.    TREATMENT PLANNING NOTE: Treatment planning then occurred.  The radiation prescription was entered and confirmed.    A total of 3 medically necessary complex treatment devices were fabricated and supervised by me, in the form of 3 fields with MLCs to block heart, lungs, esophagus, spinal cord. MORE FIELDS WITH MLCs MAY BE ADDED IN DOSIMETRY for dose homogeneity.  I have requested : 3D Simulation which is medically necessarily to spare lungs and heart from excessive dose while giving adequate dose to his tumor.  I have requested a DVH of the following structures:  GTV, PTV, heart, lungs, esophagus, spinal cord.    The patient will receive 30 Gy in 10 fractions to the left lung and mediastinum.   -----------------------------------  Eppie Gibson, MD

## 2016-03-17 NOTE — Progress Notes (Signed)
Radiation Oncology         [phone]) [phone] ________________________________  Outpatient ReConsultation  Name: Patrick MANGIARACINA Sr. MRN: [id number]  Date: 03/17/2016  DOB: 1945/08/27  NI:[id number] Cranford Mon, MD  Curt Bears, MD   REFERRING PHYSICIAN: Curt Bears, MD  DIAGNOSIS: Malignant neoplasm of left kidney [facility])   Staging form: Kidney, AJCC 7th Edition   - Clinical stage from 08/06/2015: Stage IV (T3a, N0, M1) - Signed by Curt Bears, MD on 08/06/2015    ICD-9-CM ICD-10-CM   1. Malignant neoplasm of left kidney (HCC) 189.0 C64.2   2. 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 is a 70 y.o. male who presented to the ED on 03/02/16  for a productive cough and hemoptysis. The patient states it was about a 1/2 cup of hemoptysis. Recent CT scan showed progressive disease in the left lower lung and mainstem bronchus which has been associated with hemoptysis. Tumor extends into the left hilar region. There is also new metastases in the tail of the pancreas, right adrenal gland, and a new enlarged periaortic lymph node. The patient recently treated for bronchitis. It was not improving on Doxycycline, therefore he was switched to Levaquin on 03/12/16. The patient denies hemoptysis since being discharged from the hospital on 03/04/16. The patient also saw Dr. [name] on 03/09/16. The patient is status post 4 cycles Nivolumab 240 mg (discontinued secondary to disease progression) and Votrient 600 mg by mouth daily since 10/09/15 has been on hold recently. The patient has been referred today to discuss palliative radiation to the enlarging left lower lobe lung mass. The patient's wife and son were also present during the encounter.  On review of systems: The patient reports SOB, cough, constipation (for which he is  taking a stool softener), occasional right ankle swelling from a coronary artery bypass graft, and pain in the left inframammary region at times. The patient denies skin rash, dysphagia, headaches, abdominal pain, changes in weight, or hematuria. However, the patient states he did have a terrible headache after the [facility] treatment to his brain in April.  PREVIOUS RADIATION THERAPY: Yes 08/20/2015: Right temporal brain 50m / 20 Gy in 1 fraction  PAST MEDICAL HISTORY:  has a past medical history of AAA (abdominal aortic aneurysm) ([place]; Anxiety; Asthma; Cancer ([place]; Chronic kidney disease; Chronic kidney disease; COPD (chronic obstructive pulmonary disease) ([place]; Coronary artery disease; Dehydration (12/16/2015); Depression; Diabetes mellitus without complication ([place]; Eczema; Encounter for antineoplastic chemotherapy (08/19/2015); Encounter for antineoplastic immunotherapy (01/06/2016); GERD (gastroesophageal reflux disease); Headache; Heart murmur; HPTH (hyperparathyroidism) ([place]; radiation therapy (08/20/15); Hyperlipidemia; Hyperparathyroidism ([place]; Hypertension; Hypertension (08/19/2015); Hypothyroidism; Malignant neoplasm of left kidney ([place] (02/09/2013); Melena (03/17/2016); Neuropathy ([place]; Shortness of breath dyspnea; Sleep apnea; and Thoracic aortic aneurysm ([place] (2017).    PAST SURGICAL HISTORY: Past Surgical History:  Procedure Laterality Date  . BACK SURGERY     compressed vertebrae-put in a plate to seprate the spaces  . CARDIAC CATHETERIZATION    . CERVICAL FUSION    . CORONARY ARTERY BYPASS GRAFT     x 5  . ENDOBRONCHIAL ULTRASOUND N/A 06/30/2015   Procedure: ENDOHIAL ULTRASOUND;  Surgeon: KFlora Lipps MD;  Location: [facility] ORS;  Service: Cardiopulmonary;  Laterality: N/A;  . FRACTURE SURGERY     left arm repair  . KNEE SURGERY     left  knee repair  . PARATHYROIDECTOMY  12/17/09  . VIDEO BRONCHOSCOPY WITH ENDOBRONCHIAL ULTRASOUND N/A 07/09/2015   Procedure: VIDEO BRONCHOSCOPY WITH  ENDOBRONCHIAL ULTRASOUND;  Surgeon: Ivin Poot, MD;  Location: [facility] OR;  Service: Thoracic;  Laterality: N/A;    FAMILY HISTORY: family history includes Cardiomyopathy in his mother; Congestive Heart Failure in his father; Diabetes in his brother; Heart attack in his brother; Heart disease in his brother and brother; Hypertension in his brother, father, and mother; Obesity in his brother; Sleep apnea in his son; Stroke in his mother.  SOCIAL HISTORY:  reports that he quit smoking about 14 years ago. His smoking use included Cigarettes. He has a 52.50 pack-year smoking history. He has never used smokeless tobacco. He reports that he does not drink alcohol or use drugs.  ALLERGIES: Amoxicillin; Codeine; Penicillins; and Ace inhibitors  MEDICATIONS:  Current Outpatient Prescriptions  Medication Sig Dispense Refill  . ALPRAZolam (XANAX) 0.5 MG tablet Take 0.5 mg by mouth 2 (two) times daily.    Marland Kitchen amLODipine (NORVASC) 10 MG tablet Take 10 mg by mouth daily.    Marland Kitchen aspirin EC 81 MG tablet Take 1 tablet (81 mg total) by mouth at bedtime.    . calcium carbonate (OSCAL) 1500 (600 Ca) MG TABS tablet Take 600 mg of elemental calcium by mouth 2 (two) times daily with a meal.    . cholecalciferol (VITAMIN D) 1000 units tablet Take 1,000 Units by mouth at bedtime.    . docusate sodium (COLACE) 100 MG capsule Take 100 mg by mouth 2 (two) times daily.    . feeding supplement, ENSURE ENLIVE, (ENSURE ENLIVE) LIQD Take 237 mLs by mouth 2 (two) times daily between meals. 237 mL 12  . FeFum-FePoly-FA-B Cmp-C-Biot (INTEGRA PLUS) CAPS Take 1 capsule by mouth every morning. 30 capsule 2  . FLUoxetine (PROZAC) 40 MG capsule Take 40 mg by mouth daily.    . Fluticasone-Salmeterol (ADVAIR) 100-50 MCG/DOSE AEPB Inhale 1 puff into the lungs 2 (two) times daily.    Marland Kitchen levofloxacin (LEVAQUIN) 500 MG tablet Take 1 tablet (500 mg total) by mouth daily. 10 tablet 0  . meclizine (ANTIVERT) 25 MG tablet Take 25 mg by mouth 3  (three) times daily as needed for dizziness.     . mometasone (ELOCON) 0.1 % cream Apply 1 application topically 2 (two) times daily as needed (for itching).    Marland Kitchen PROAIR HFA 108 (90 Base) MCG/ACT inhaler INHALE 1 OR 2 PUFFS AS NEEDED 8.5 g 11  . prochlorperazine (COMPAZINE) 10 MG tablet Take 10 mg by mouth every 6 (six) hours as needed for nausea or vomiting.   0   Current Facility-Administered Medications  Medication Dose Route Frequency Provider Last Rate Last Dose  . levalbuterol (XOPENEX) nebulizer solution 1.25 mg  1.25 mg Nebulization Once Mar Daring, PA-C        REVIEW OF SYSTEMS:  Notable for that above.   PHYSICAL EXAM:  height is '5\' 11"'$  (1.803 m) and weight is 232 lb 12.8 oz (105.6 kg). His temperature is 98.2 F (36.8 C). His blood pressure is 131/73 and his pulse is 88. His oxygen saturation is 96%.   General: Alert and oriented, in no acute distress HEENT: Head is normocephalic. Extraocular movements are intact. Oropharynx and oral cavity are clear. Neck: Neck is supple, no palpable cervical or supraclavicular lymphadenopathy. Heart: Systolic murmur loudest in the aortic region. Regular in rate. Chest: Wheezes bilaterally. Abdomen: Soft, nontender, nondistended, with no rigidity or guarding.  Extremities: No cyanosis or edema. Lymphatics: see Neck Exam Skin: No concerning lesions. Musculoskeletal: 5/5 symmetric strength and muscle tone throughout. Neurologic: Cranial nerves II through XII are grossly intact. No obvious focalities. Speech is fluent. Coordination is intact. Psychiatric: Judgment and insight are intact. Affect is appropriate.  ECOG = 1  0 - Asymptomatic (Fully active, able to carry on all predisease activities without restriction)  1 - Symptomatic but completely ambulatory (Restricted in physically strenuous activity but ambulatory and able to carry out work of a light or sedentary nature. For example, light housework, office work)  2 - Symptomatic,  <50% in bed during the day (Ambulatory and capable of all self care but unable to carry out any work activities. Up and about more than 50% of waking hours)  3 - Symptomatic, >50% in bed, but not bedbound (Capable of only limited self-care, confined to bed or chair 50% or more of waking hours)  4 - Bedbound (Completely disabled. Cannot carry on any self-care. Totally confined to bed or chair)  5 - Death   Eustace Pen MM, Creech RH, Tormey DC, et al. [phone]). "Toxicity and response criteria of the [facility]". [place] Oncol. 5 (6): 649-55   LABORATORY DATA:  Lab Results  Component Value Date   WBC 12.0 (H) 03/09/2016   HGB 10.5 (L) 03/09/2016   HCT 32.1 (L) 03/09/2016   MCV 92.5 03/09/2016   PLT 352 03/09/2016   CMP     Component Value Date/Time   NA 138 03/09/2016 0749   K 4.4 03/09/2016 0749   CL 103 03/04/2016 0444   CL 103 01/19/2012 0834   CO2 23 03/09/2016 0749   GLUCOSE 193 (H) 03/09/2016 0749   BUN 21.6 03/09/2016 0749   CREATININE 1.2 03/09/2016 0749   CALCIUM 10.2 03/09/2016 0749   PROT 7.8 03/09/2016 0749   ALBUMIN 3.3 (L) 03/09/2016 0749   AST 13 03/09/2016 0749   ALT 8 03/09/2016 0749   ALKPHOS 100 03/09/2016 0749   BILITOT 0.42 03/09/2016 0749   GFRNONAA >60 03/04/2016 0444   GFRNONAA >60 01/19/2012 0834   GFRAA >60 03/04/2016 0444   GFRAA >60 01/19/2012 0834         RADIOGRAPHY: Ct Chest W Contrast  Result Date: 03/08/2016 CLINICAL DATA:  Renal cell carcinoma. EXAM: CT CHEST, ABDOMEN, AND PELVIS WITH CONTRAST TECHNIQUE: Multidetector CT imaging of the chest, abdomen and pelvis was performed following the standard protocol during bolus administration of intravenous contrast. CONTRAST:  78m ISOVUE-300 IOPAMIDOL (ISOVUE-300) INJECTION 61%, 745mISOVUE-300 IOPAMIDOL (ISOVUE-300) INJECTION 61% COMPARISON:  CT chest 01/02/2016 CT abdomen and pelvis 12/22/2015 FINDINGS: CT CHEST FINDINGS Cardiovascular: Normal heart size. Previous median  sternotomy and CABG procedure. Aortic atherosclerosis noted. There is no pericardial effusion. Mediastinum/Nodes: The trachea appears patent and midline. Unremarkable appearance of the esophagus. Left paratracheal lymph node measures 3 cm short axis, image 26 of series 2. Previously 2 cm. Adjacent AP window lymph node has a short axis of 2.8 cm, image 26 of series 2. Previously 2.5 cm. Tumor is now seen extending into the left mainstem bronchus region, image 95 of series 6. Index sub- carinal lymph node measures 2.9 cm, image 34 of series 2. Previously 1.9 cm index left hilar node measures 1.8 cm, image 38 of series 2. Previously 1.5 cm. Lungs/Pleura: No pleural effusion identified. Left lower lobe lung mass measures 4.7 x 5.4 cm, image 84 of series 5. Previously 2.9 x 4.8 cm. Tumor extends into the left  hilar region and there is a new endobronchial component to the lingula, image 84 of series 5. Increase in size of lingular nodule which measures 11 mm, image 87 of series 5. Previously 8 mm. Perifissural nodule within the right lower lobe measures 9 mm, image 72 of series 5. Previously 4 mm. There is a small right upper lobe lung nodule which measures 7 mm, image 44 of series 5. Previously 4 mm. Left apical nodule measures 9 mm, image 29 of series 5. Previously 5 mm. Musculoskeletal: No chest wall mass or suspicious bone lesions identified. CT ABDOMEN PELVIS FINDINGS Hepatobiliary: Stable small low density structure within the medial segment of left lobe measuring 4 mm. The gallbladder is normal. No biliary dilatation. Pancreas: Solid enhancing lesion within the tail of pancreas is new measuring 2.4 cm, image 74 of series 2. No pancreatic duct dilatation. No inflammation identified. Spleen: Normal in size without focal abnormality. Adrenals/Urinary Tract: Nodule in the right adrenal gland measures 2.7 cm, image 64 of series 2. New from 12/22/2015. There is a new nodule within the left adrenal gland which measures  1.4 cm, image 70 of series 2. The right kidney is normal. No mass or hydronephrosis. Status post left nephrectomy. Stomach/Bowel: Stomach is within normal limits. Appendix appears normal. No evidence of bowel wall thickening, distention, or inflammatory changes. Vascular/Lymphatic: Aortic atherosclerosis noted. Enlarged periaortic lymph node Measures 1.3 cm, image 79 of series 2. New from previous exam. No pelvic or inguinal adenopathy. a Reproductive: Prostate is unremarkable. Other: There is a small periumbilical hernia which contains a nonobstructed loop of small bowel, image 97 of series 2. Musculoskeletal: No aggressive lytic or sclerotic bone lesions. Degenerative disc disease is identified at L2-3. IMPRESSION: 1. Interval progression of disease. 2. When compared with 01/02/2016 there has been increase of thoracic nodal metastasis and pulmonary nodules/masses within both lungs. Tumor within knee left lower lobe/left hilar region can now be seen extending into the left mainstem bronchus. 3. When compared with the previous CT of the abdomen and pelvis from 12/22/2015 there is new metastasis to the tail of pancreas and right adrenal gland. There is also a new enlarged periaortic lymph node. Electronically Signed   By: Kerby Moors M.D.   On: 03/08/2016 15:37   Ct Angio Chest Pe W And/or Wo Contrast  Result Date: 03/02/2016 CLINICAL DATA:  History of renal cell adenocarcinoma with cold for 2-3 weeks, known prior lung mass, initial encounter EXAM: CT ANGIOGRAPHY CHEST WITH CONTRAST TECHNIQUE: Multidetector CT imaging of the chest was performed using the standard protocol during bolus administration of intravenous contrast. Multiplanar CT image reconstructions and MIPs were obtained to evaluate the vascular anatomy. CONTRAST:  90 mL Isovue 370. COMPARISON:  01/02/2016 FINDINGS: Cardiovascular: The thoracic aorta shows atherosclerotic calcifications and changes of prior coronary bypass grafting similar to that  seen on the prior exam. Dilatation of the ascending aorta is again identified to approximately 5 cm. This is stable when compared with the prior exam. Cardiac structures are within normal limits. Heavy coronary calcifications are noted. The pulmonary artery demonstrates a normal branching pattern. Some attenuation of the branches on the left is noted due to mass effect although no definitive pulmonary emboli are seen. Similar findings are noted on the right but to a lesser degree. Mediastinum/Nodes: Better visualized due to the administration of IV contrast is a large conglomeration of lymphadenopathy within the AP window measuring approximately 5.6 x 5.9 cm. This shows some increase in bulk when compared with the  prior exam and some central decreased attenuation suggestive of necrosis. Bulky left hilar lymph nodes are noted also increased in size from the prior exam. The largest of these measures approximately 2.3 cm in short axis. The subcarinal adenopathy measures 3.4 cm in short axis increased from 1.9 cm on the prior exam. Increasing right hilar adenopathy is noted. The largest area measures approximately 2 cm in short axis best seen on image number 155 of series 10. Lungs/Pleura: The nodule seen in the left upper lobe has increased in size now measuring approximately 6 mm. The lingular nodule seen previously now measures approximately 10 mm increased in size from the prior exam. Previously seen left lower lobe mass has also increased in size now measuring approximately 5.5 by 5.1 cm. The predominant growth is noted from right to left when compared with the prior exam. A few smaller nodules are noted within the left lower lobe also consistent with progression of disease. The right lung remains clear with the exception of enlargement along the major fissure best seen on image number 43 of series 11 now measuring almost 10 mm increased from 4 mm. Upper Abdomen: Visualized upper abdomen shows changes consistent  with prior left nephrectomy. No new focal abnormality is seen. Musculoskeletal: Postsurgical changes are noted in the lower cervical spine. No compression deformities are seen. Mild degenerative changes noted within the thoracic spine. No definitive lytic or sclerotic lesion is identified to suggest metastatic disease. 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 The T7 sclerotic lesions seen previously is stable and again likely represents a bone island. Review of the MIP images confirms the above findings. IMPRESSION: Significant progression in the degree of mediastinal and hilar lymphadenopathy bilaterally. Additionally progression in the dominant left lower lobe mass lesion as well as multiple scattered nodules throughout both lungs. This is consistent with progression of metastatic disease. No evidence of pulmonary emboli although there are findings of attenuation/compression of the pulmonary arterial branches particularly on the left secondary to hilar adenopathy and mediastinal adenopathy Stable ascending aortic dilatation. Ascending thoracic aortic aneurysm. Recommend semi-annual imaging followup by CTA or MRA and referral to cardiothoracic surgery if not already obtained. This recommendation follows 2010 ACCF/AHA/AATS/ACR/ASA/SCA/SCAI/SIR/STS/SVM Guidelines for the Diagnosis and Management of Patients With Thoracic Aortic Disease. Circulation. 2010; 121: J188-C166 Electronically Signed   By: Inez Catalina M.D.   On: 03/02/2016 17:33   Ct Abdomen Pelvis W Contrast  Result Date: 03/08/2016 CLINICAL DATA:  Renal cell carcinoma. EXAM: CT CHEST, ABDOMEN, AND PELVIS WITH CONTRAST TECHNIQUE: Multidetector CT imaging of the chest, abdomen and pelvis was performed following the standard protocol during bolus administration of intravenous contrast. CONTRAST:  40m ISOVUE-300 IOPAMIDOL (ISOVUE-300) INJECTION 61%, 74mISOVUE-300 IOPAMIDOL (ISOVUE-300) INJECTION 61% COMPARISON:  CT chest  01/02/2016 CT abdomen and pelvis 12/22/2015 FINDINGS: CT CHEST FINDINGS Cardiovascular: Normal heart size. Previous median sternotomy and CABG procedure. Aortic atherosclerosis noted. There is no pericardial effusion. Mediastinum/Nodes: The trachea appears patent and midline. Unremarkable appearance of the esophagus. Left paratracheal lymph node measures 3 cm short axis, image 26 of series 2. Previously 2 cm. Adjacent AP window lymph node has a short axis of 2.8 cm, image 26 of series 2. Previously 2.5 cm. Tumor is now seen extending into the left mainstem bronchus region, image 95 of series 6. Index sub- carinal lymph node measures 2.9 cm, image 34 of series 2. Previously 1.9 cm index left hilar node measures 1.8 cm, image 38 of series 2. Previously 1.5 cm. Lungs/Pleura: No  pleural effusion identified. Left lower lobe lung mass measures 4.7 x 5.4 cm, image 84 of series 5. Previously 2.9 x 4.8 cm. Tumor extends into the left hilar region and there is a new endobronchial component to the lingula, image 84 of series 5. Increase in size of lingular nodule which measures 11 mm, image 87 of series 5. Previously 8 mm. Perifissural nodule within the right lower lobe measures 9 mm, image 72 of series 5. Previously 4 mm. There is a small right upper lobe lung nodule which measures 7 mm, image 44 of series 5. Previously 4 mm. Left apical nodule measures 9 mm, image 29 of series 5. Previously 5 mm. Musculoskeletal: No chest wall mass or suspicious bone lesions identified. CT ABDOMEN PELVIS FINDINGS Hepatobiliary: Stable small low density structure within the medial segment of left lobe measuring 4 mm. The gallbladder is normal. No biliary dilatation. Pancreas: Solid enhancing lesion within the tail of pancreas is new measuring 2.4 cm, image 74 of series 2. No pancreatic duct dilatation. No inflammation identified. Spleen: Normal in size without focal abnormality. Adrenals/Urinary Tract: Nodule in the right adrenal gland  measures 2.7 cm, image 64 of series 2. New from 12/22/2015. There is a new nodule within the left adrenal gland which measures 1.4 cm, image 70 of series 2. The right kidney is normal. No mass or hydronephrosis. Status post left nephrectomy. Stomach/Bowel: Stomach is within normal limits. Appendix appears normal. No evidence of bowel wall thickening, distention, or inflammatory changes. Vascular/Lymphatic: Aortic atherosclerosis noted. Enlarged periaortic lymph node Measures 1.3 cm, image 79 of series 2. New from previous exam. No pelvic or inguinal adenopathy. a Reproductive: Prostate is unremarkable. Other: There is a small periumbilical hernia which contains a nonobstructed loop of small bowel, image 97 of series 2. Musculoskeletal: No aggressive lytic or sclerotic bone lesions. Degenerative disc disease is identified at L2-3. IMPRESSION: 1. Interval progression of disease. 2. When compared with 01/02/2016 there has been increase of thoracic nodal metastasis and pulmonary nodules/masses within both lungs. Tumor within knee left lower lobe/left hilar region can now be seen extending into the left mainstem bronchus. 3. When compared with the previous CT of the abdomen and pelvis from 12/22/2015 there is new metastasis to the tail of pancreas and right adrenal gland. There is also a new enlarged periaortic lymph node. Electronically Signed   By: Kerby Moors M.D.   On: 03/08/2016 15:37      IMPRESSION/PLAN: Progressive metastatic disease in the chest  The patient would be a good candidate for 2 weeks / 10 fractions of palliative radiation to the left lower lung mass including disease at the carina, left main bronchus, and disease underneath the aortic arch. This will decrease risk of chronic/worsening hemoptysis or airway obstruction  I would not recommend palliative RT to the asymptomatic disease in the abdomen however.  Today, I talked to the patient and family about the findings and work-up thus far.   We discussed the natural history of metastatic renal cell carcinoma and general treatment, highlighting the role of radiotherapy in the management.  We discussed the available radiation techniques, and focused on the details of logistics and delivery.  We reviewed the anticipated acute and late sequelae associated with radiation in this setting - including but not restricted to esophagitis, skin irritation, fatigue.  The patient was encouraged to ask questions that I answered to the best of my ability.  The patient signed a consent form and we retained a copy for our  records.  The patient would like to proceed with radiation and will be scheduled for CT simulation today at 10:30AM.  __________________________________________   Eppie Gibson, MD  This document serves as a record of services personally performed by Eppie Gibson, MD. It was created on her behalf by Darcus Austin, a trained medical scribe. The creation of this record is based on the scribe's personal observations and the provider's statements to them. This document has been checked and approved by the attending provider.

## 2016-03-22 ENCOUNTER — Telehealth: Payer: Self-pay | Admitting: Medical Oncology

## 2016-03-22 ENCOUNTER — Ambulatory Visit (HOSPITAL_BASED_OUTPATIENT_CLINIC_OR_DEPARTMENT_OTHER): Payer: Medicare Other | Admitting: Nurse Practitioner

## 2016-03-22 ENCOUNTER — Encounter: Payer: Self-pay | Admitting: Nurse Practitioner

## 2016-03-22 ENCOUNTER — Other Ambulatory Visit: Payer: Self-pay | Admitting: Internal Medicine

## 2016-03-22 ENCOUNTER — Other Ambulatory Visit: Payer: Self-pay | Admitting: Medical Oncology

## 2016-03-22 ENCOUNTER — Other Ambulatory Visit (HOSPITAL_BASED_OUTPATIENT_CLINIC_OR_DEPARTMENT_OTHER): Payer: Medicare Other

## 2016-03-22 VITALS — BP 119/63 | HR 94 | Temp 98.0°F | Resp 19 | Ht 71.0 in | Wt 232.0 lb

## 2016-03-22 DIAGNOSIS — C7802 Secondary malignant neoplasm of left lung: Secondary | ICD-10-CM | POA: Diagnosis not present

## 2016-03-22 DIAGNOSIS — C7931 Secondary malignant neoplasm of brain: Secondary | ICD-10-CM | POA: Diagnosis not present

## 2016-03-22 DIAGNOSIS — J189 Pneumonia, unspecified organism: Secondary | ICD-10-CM | POA: Diagnosis not present

## 2016-03-22 DIAGNOSIS — C649 Malignant neoplasm of unspecified kidney, except renal pelvis: Secondary | ICD-10-CM

## 2016-03-22 DIAGNOSIS — C642 Malignant neoplasm of left kidney, except renal pelvis: Secondary | ICD-10-CM | POA: Diagnosis not present

## 2016-03-22 LAB — COMPREHENSIVE METABOLIC PANEL
ALBUMIN: 3.2 g/dL — AB (ref 3.5–5.0)
ALK PHOS: 85 U/L (ref 40–150)
ALT: 9 U/L (ref 0–55)
ANION GAP: 9 meq/L (ref 3–11)
AST: 14 U/L (ref 5–34)
BILIRUBIN TOTAL: 0.43 mg/dL (ref 0.20–1.20)
BUN: 22.4 mg/dL (ref 7.0–26.0)
CALCIUM: 10.1 mg/dL (ref 8.4–10.4)
CO2: 24 meq/L (ref 22–29)
CREATININE: 1.4 mg/dL — AB (ref 0.7–1.3)
Chloride: 103 mEq/L (ref 98–109)
EGFR: 51 mL/min/{1.73_m2} — AB (ref >90)
Glucose: 140 mg/dl (ref 70–140)
Potassium: 4.7 mEq/L (ref 3.5–5.1)
Sodium: 136 mEq/L (ref 136–145)
TOTAL PROTEIN: 7.5 g/dL (ref 6.4–8.3)

## 2016-03-22 LAB — CBC WITH DIFFERENTIAL/PLATELET
BASO%: 1.1 % (ref 0.0–2.0)
Basophils Absolute: 0.1 10*3/uL (ref 0.0–0.1)
EOS ABS: 0.4 10*3/uL (ref 0.0–0.5)
EOS%: 4.6 % (ref 0.0–7.0)
HEMATOCRIT: 34.1 % — AB (ref 38.4–49.9)
HEMOGLOBIN: 11.1 g/dL — AB (ref 13.0–17.1)
LYMPH#: 1.7 10*3/uL (ref 0.9–3.3)
LYMPH%: 17.1 % (ref 14.0–49.0)
MCH: 29.6 pg (ref 27.2–33.4)
MCHC: 32.5 g/dL (ref 32.0–36.0)
MCV: 91 fL (ref 79.3–98.0)
MONO#: 0.6 10*3/uL (ref 0.1–0.9)
MONO%: 6.6 % (ref 0.0–14.0)
NEUT%: 70.6 % (ref 39.0–75.0)
NEUTROS ABS: 6.8 10*3/uL — AB (ref 1.5–6.5)
PLATELETS: 311 10*3/uL (ref 140–400)
RBC: 3.74 10*6/uL — ABNORMAL LOW (ref 4.20–5.82)
RDW: 14.6 % (ref 11.0–14.6)
WBC: 9.7 10*3/uL (ref 4.0–10.3)

## 2016-03-22 MED ORDER — METHYLPREDNISOLONE 4 MG PO TBPK: ORAL_TABLET | ORAL | 0 refills | Status: DC

## 2016-03-22 NOTE — Telephone Encounter (Signed)
Persistent "bronchitis, wheezing and coughing, had two antibiotics and nothing is touching it-I have to do something" Offered [facility] and appt give to pt for 200 pm labs and 230 SMC.

## 2016-03-22 NOTE — Assessment & Plan Note (Signed)
Patient has been diagnosed with renal cell cancer with metastasis to both the lung to the brain.  Patient is status post Nivolumab immunotherapy last received on 02/24/2016.  He has also underwent brain irradiation in the past as well.  He is currently undergoing observation only.  He is scheduled to initiate radiation treatments to the chest on Wednesday, 03/24/2016.  Patient is scheduled to return for labs and a follow-up visit on 03/29/2016.

## 2016-03-22 NOTE — Assessment & Plan Note (Signed)
Patient presents to the [facility] today with complaint of an approximate 10 day history of very congested cough and wheezing.  He states that he was initially given doxycycline for treatment of bronchitis; which was later switched to Levaquin antibiotics.  He states he only has one Levaquin antibiotic.  Tablet left; in his symptoms.  Continue with no improvement.  He also uses an Advair inhaler and albuterol inhaler as directed.  He takes over-the-counter Delsym as well.  Patient denies any recent fevers or chills.  On exam today.  Patient has wheezing to all lung fields and has what appears to be cough spasms as well.  Vital signs are stable and O2 sat is 98% on room air.  Patient was afebrile today.  Reviewed all findings with Dr. [name]; he recommended treating patient for probable pneumonitis.  Patient will be given a prescription for Medrol Dosepak to see if this helps.  The plan is to recheck the patient via telephone call in approximately 40 hours; to see if he has had any improvement with the Medrol Dosepak.  If he has had improvement-will consider continuing the steroids for a slow taper.  Also, patient was advised to go directly to the emergency department for any worsening symptoms whatsoever.

## 2016-03-22 NOTE — Progress Notes (Signed)
SYMPTOM MANAGEMENT CLINIC    Chief Complaint: Pneumonitis  HPI:  Patrick Breed Sr. 70 y.o. 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    No history exists.    Review of Systems  Constitutional: Positive for malaise/fatigue.  Respiratory: Positive for cough and wheezing.   All other systems reviewed and are negative.   Past Medical History:  Diagnosis Date  . AAA (abdominal aortic aneurysm) ([place])   . Anxiety   . Asthma   . Cancer [facility])    Renal Cell- 2013; Lung 2017  . Chronic kidney disease    Renal Cell Adenocarcinoma  . Chronic kidney disease    Left Nephrectomy  . COPD (chronic obstructive pulmonary disease) ([place])   . Coronary artery disease   . Dehydration 12/16/2015  . Depression   . Diabetes mellitus without complication ([place])    Type II  . Eczema   . Encounter for antineoplastic chemotherapy 08/19/2015  . Encounter for antineoplastic immunotherapy 01/06/2016  . GERD (gastroesophageal reflux disease)   . Headache   . Heart murmur   . HPTH (hyperparathyroidism) ([place])   . Hx of radiation therapy 08/20/15   Right Temporal brain  . Hyperlipidemia   . Hyperparathyroidism ([place])   . Hypertension   . Hypertension 08/19/2015  . Hypothyroidism   . Malignant neoplasm of left kidney ([place]) 02/09/2013  . Melena 03/17/2016  . Neuropathy ([place])   . Shortness of breath dyspnea   . Sleep apnea    CPAP  . Thoracic aortic aneurysm ([place]) 2017    Past Surgical History:  Procedure Laterality Date  . BACK SURGERY     compressed vertebrae-put in a plate to seprate the spaces  . CARDIAC CATHETERIZATION    . CERVICAL FUSION    . CORONARY ARTERY BYPASS GRAFT     x 5  . ENDOBRONCHIAL ULTRASOUND N/A 06/30/2015   Procedure: ENDOHIAL ULTRASOUND;  Surgeon: Flora Lipps, MD;  Location: [facility] ORS;  Service: Cardiopulmonary;  Laterality: N/A;  . FRACTURE SURGERY     left arm repair  . KNEE SURGERY     left knee repair  . PARATHYROIDECTOMY   12/17/09  . VIDEO BRONCHOSCOPY WITH ENDOBRONCHIAL ULTRASOUND N/A 07/09/2015   Procedure: VIDEO BRONCHOSCOPY WITH ENDOBRONCHIAL ULTRASOUND;  Surgeon: Ivin Poot, MD;  Location: [place];  Service: Thoracic;  Laterality: N/A;    has COPD, mild ([place]); Atherosclerosis of coronary artery; Clinical depression; Diabetes mellitus, type 2 ([place]); Essential (primary) hypertension; Cardiac murmur; HLD (hyperlipidemia); Adult hypothyroidism; Adiposity; Adenocarcinoma, renal cell ([place]); Arteriosclerosis of coronary artery; CAFL (chronic airflow limitation) ([place]); Enterogastritis; Acid reflux; HPTH (hyperparathyroidism) ([place]); BP (high blood pressure); Eczema intertrigo; Obstructive apnea; Malignant neoplasm of left kidney ([place]); Chronic obstructive pulmonary disease ([place]); Personal history of other malignant neoplasm of kidney; Hyperparathyroidism ([place]); Type 2 diabetes mellitus ([place]); Brain metastasis ([place]); Encounter for antineoplastic chemotherapy; Hypertension; Dehydration; Acute renal failure (ARF) ([place]); Encounter for antineoplastic immunotherapy; Cough with hemoptysis; Melena; 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    is allergic to amoxicillin; codeine; penicillins; and ace inhibitors.    Medication List       Accurate as of 03/22/16  5:42 PM. Always use your most recent med list.          ALPRAZolam 0.5 MG tablet Commonly known as:  XANAX Take 0.5 mg by mouth 2 (two) times daily.   amLODipine 10 MG tablet Commonly known as:  NORVASC Take 10 mg by mouth daily.   aspirin EC 81 MG tablet Take 1 tablet (81 mg total) by mouth at bedtime.   calcium carbonate 1500 (600 Ca) MG Tabs tablet Commonly known as:  OSCAL Take 600 mg of elemental calcium by mouth 2 (two) times daily with a meal.   cholecalciferol 1000 units tablet Commonly known as:  VITAMIN D Take 1,000 Units by mouth at bedtime.   docusate sodium 100 MG capsule Commonly known  as:  COLACE Take 100 mg by mouth 2 (two) times daily.   feeding supplement (ENSURE ENLIVE) Liqd Take 237 mLs by mouth 2 (two) times daily between meals.   FLUoxetine 40 MG capsule Commonly known as:  PROZAC Take 40 mg by mouth daily.   Fluticasone-Salmeterol 100-50 MCG/DOSE Aepb Commonly known as:  ADVAIR Inhale 1 puff into the lungs 2 (two) times daily.   INTEGRA PLUS Caps Take 1 capsule by mouth every morning.   levofloxacin 500 MG tablet Commonly known as:  LEVAQUIN Take 1 tablet (500 mg total) by mouth daily.   meclizine 25 MG tablet Commonly known as:  ANTIVERT Take 25 mg by mouth 3 (three) times daily as needed for dizziness.   methylPREDNISolone 4 MG Tbpk tablet Commonly known as:  MEDROL DOSEPAK Medrol dose pak: take as directed.   mometasone 0.1 % cream Commonly known as:  ELOCON Apply 1 application topically 2 (two) times daily as needed (for itching).   PROAIR HFA 108 (90 Base) MCG/ACT inhaler Generic drug:  albuterol INHALE 1 OR 2 PUFFS AS NEEDED   prochlorperazine 10 MG tablet Commonly known as:  COMPAZINE Take 10 mg by mouth every 6 (six) hours as needed for nausea or vomiting.   prochlorperazine 10 MG tablet Commonly known as:  COMPAZINE TAKE 1 TABLET EVERY 6 HOURS AS NEEDED FOR NAUSEA AND VOMITING        PHYSICAL EXAMINATION  Oncology Vitals 03/22/2016 03/17/2016  Height 180 cm 180 cm  Weight 105.235 kg 105.597 kg  Weight (lbs) 232 lbs 232 lbs 13 oz  BMI (kg/m2) 32.36 kg/m2 32.47 kg/m2  Temp 98 98.2  Pulse 94 88  Resp 19 -  SpO2 98 96  BSA (m2) 2.3 m2 2.3 m2   BP Readings from Last 2 Encounters:  03/22/16 119/63  03/17/16 131/73    Physical Exam  Constitutional: He is oriented to person, place, and time and well-developed, well-nourished, and in no distress.  HENT:  Head: Normocephalic and atraumatic.  Mouth/Throat: Oropharynx is clear and moist.  Eyes: Conjunctivae and EOM are normal. Pupils are equal, round, and reactive to  light. Right eye exhibits no discharge. Left eye exhibits no discharge. No scleral icterus.  Neck: Normal range of motion. Neck supple. No JVD present. No tracheal deviation present. No thyromegaly present.  Cardiovascular: Normal rate, regular rhythm, normal heart sounds and intact distal pulses.   Pulmonary/Chest: Effort normal. No respiratory distress. He has wheezes. He has rales. He exhibits no tenderness.  Abdominal: Soft. Bowel sounds are normal. He exhibits no distension and no mass. There is no tenderness. There is no rebound and no guarding.  Musculoskeletal: Normal range of motion. He exhibits no edema, tenderness or deformity.  Lymphadenopathy:    He has no cervical adenopathy.  Neurological: He is alert and oriented to person, place, and time. Gait normal.  Skin: Skin is warm and dry. No rash noted. No erythema. No pallor.  Psychiatric: Affect normal.  Nursing note and vitals reviewed.   LABORATORY  DATA:. Appointment on 03/22/2016  Component Date Value Ref Range Status  . WBC 03/22/2016 9.7  4.0 - 10.3 10e3/uL Final  . NEUT# 03/22/2016 6.8* 1.5 - 6.5 10e3/uL Final  . HGB 03/22/2016 11.1* 13.0 - 17.1 g/dL Final  . HCT 03/22/2016 34.1* 38.4 - 49.9 % Final  . Platelets 03/22/2016 311  140 - 400 10e3/uL Final  . MCV 03/22/2016 91.0  79.3 - 98.0 fL Final  . MCH 03/22/2016 29.6  27.2 - 33.4 pg Final  . MCHC 03/22/2016 32.5  32.0 - 36.0 g/dL Final  . RBC 03/22/2016 3.74* 4.20 - 5.82 10e6/uL Final  . RDW 03/22/2016 14.6  11.0 - 14.6 % Final  . lymph# 03/22/2016 1.7  0.9 - 3.3 10e3/uL Final  . MONO# 03/22/2016 0.6  0.1 - 0.9 10e3/uL Final  . Eosinophils Absolute 03/22/2016 0.4  0.0 - 0.5 10e3/uL Final  . Basophils Absolute 03/22/2016 0.1  0.0 - 0.1 10e3/uL Final  . NEUT% 03/22/2016 70.6  39.0 - 75.0 % Final  . LYMPH% 03/22/2016 17.1  14.0 - 49.0 % Final  . MONO% 03/22/2016 6.6  0.0 - 14.0 % Final  . EOS% 03/22/2016 4.6  0.0 - 7.0 % Final  . BASO% 03/22/2016 1.1  0.0 - 2.0 %  Final  . Sodium 03/22/2016 136  136 - 145 mEq/L Final  . Potassium 03/22/2016 4.7  3.5 - 5.1 mEq/L Final  . Chloride 03/22/2016 103  98 - 109 mEq/L Final  . CO2 03/22/2016 24  22 - 29 mEq/L Final  . Glucose 03/22/2016 140  70 - 140 mg/dl Final  . BUN 03/22/2016 22.4  7.0 - 26.0 mg/dL Final  . Creatinine 03/22/2016 1.4* 0.7 - 1.3 mg/dL Final  . Total Bilirubin 03/22/2016 0.43  0.20 - 1.20 mg/dL Final  . Alkaline Phosphatase 03/22/2016 85  40 - 150 U/L Final  . AST 03/22/2016 14  5 - 34 U/L Final  . ALT 03/22/2016 9  0 - 55 U/L Final  . Total Protein 03/22/2016 7.5  6.4 - 8.3 g/dL Final  . Albumin 03/22/2016 3.2* 3.5 - 5.0 g/dL Final  . Calcium 03/22/2016 10.1  8.4 - 10.4 mg/dL Final  . Anion Gap 03/22/2016 9  3 - 11 mEq/L Final  . EGFR 03/22/2016 51* >90 ml/min/1.73 m2 Final    RADIOGRAPHIC STUDIES: No results found.  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  Patient is status post Nivolumab immunotherapy last received on 02/24/2016.  He has also underwent brain irradiation in the past as well.  He is currently undergoing observation only.  He is scheduled to initiate radiation treatments to the chest on Wednesday, 03/24/2016.  Patient is scheduled to return for labs and a follow-up visit on 03/29/2016.  Pneumonitis Patient presents to the [facility] today with complaint of an approximate 10 day history of very congested cough and wheezing.  He states that he was initially given doxycycline for treatment of bronchitis; which was later switched to Levaquin antibiotics.  He states he only has one Levaquin antibiotic.  Tablet left; in his symptoms.  Continue with no improvement.  He also uses an Advair inhaler and albuterol inhaler as directed.  He takes over-the-counter Delsym as well.  Patient denies any recent fevers or chills.  On exam today.  Patient  has wheezing to all lung fields and has what appears to  be cough spasms as well.  Vital signs are stable and O2 sat is 98% on room air.  Patient was afebrile today.  Reviewed all findings with Dr. [name]; he recommended treating patient for probable pneumonitis.  Patient will be given a prescription for Medrol Dosepak to see if this helps.  The plan is to recheck the patient via telephone call in approximately 40 hours; to see if he has had any improvement with the Medrol Dosepak.  If he has had improvement-will consider continuing the steroids for a slow taper.  Also, patient was advised to go directly to the emergency department for any worsening symptoms whatsoever.   Patient stated understanding of all instructions; and was in agreement with this plan of care. The patient knows to call the clinic with any problems, questions or concerns.   Total time spent with patient was 25 minutes;  with greater than 75 percent of that time spent in face to face counseling regarding patient's symptoms,  and coordination of care and follow up.  Disclaimer:This dictation was prepared with Dragon/digital dictation along with [employer]. Any transcriptional errors that result from this process are unintentional.  Drue Second, NP 03/22/2016

## 2016-03-23 ENCOUNTER — Ambulatory Visit: Payer: Medicare Other | Admitting: Internal Medicine

## 2016-03-23 ENCOUNTER — Other Ambulatory Visit: Payer: Medicare Other

## 2016-03-23 ENCOUNTER — Ambulatory Visit: Payer: Medicare Other

## 2016-03-24 ENCOUNTER — Telehealth: Payer: Self-pay | Admitting: Nurse Practitioner

## 2016-03-24 ENCOUNTER — Ambulatory Visit
Admission: RE | Admit: 2016-03-24 | Discharge: 2016-03-24 | Disposition: A | Discharge status: Home / Self Care | Payer: Medicare Other | Source: Ambulatory Visit | Attending: Radiation Oncology | Admitting: Radiation Oncology

## 2016-03-24 DIAGNOSIS — C642 Malignant neoplasm of left kidney, except renal pelvis: Secondary | ICD-10-CM | POA: Diagnosis not present

## 2016-03-24 NOTE — Telephone Encounter (Signed)
Call patient's check in with him today.  Patient states that he begins his radiation treatments to his chest this evening.  He is happy to report that the Medrol Dosepak that he initiated earlier this week has greatly improved his coughing fits and spasms.  He states last I was personally he was able to sleep throughout the night without waking up from coughing.  Advice patient would review all with Dr. [name] again in the morning; to see if he would like to extend the prednisone taper for treatment of probable pneumonitis.

## 2016-03-25 ENCOUNTER — Other Ambulatory Visit: Payer: Self-pay | Admitting: Nurse Practitioner

## 2016-03-25 ENCOUNTER — Ambulatory Visit
Admission: RE | Admit: 2016-03-25 | Discharge: 2016-03-25 | Disposition: A | Discharge status: Home / Self Care | Payer: Medicare Other | Source: Ambulatory Visit | Attending: Radiation Oncology | Admitting: Radiation Oncology

## 2016-03-25 ENCOUNTER — Telehealth: Payer: Self-pay | Admitting: Nurse Practitioner

## 2016-03-25 DIAGNOSIS — C7802 Secondary malignant neoplasm of left lung: Secondary | ICD-10-CM

## 2016-03-25 DIAGNOSIS — C642 Malignant neoplasm of left kidney, except renal pelvis: Secondary | ICD-10-CM | POA: Diagnosis not present

## 2016-03-25 DIAGNOSIS — J189 Pneumonia, unspecified organism: Secondary | ICD-10-CM

## 2016-03-25 MED ORDER — PREDNISONE 10 MG PO TABS: ORAL_TABLET | ORAL | 0 refills | Status: DC

## 2016-03-25 NOTE — Telephone Encounter (Signed)
Reviewed all with Dr. [name]; and then prescribed a slow taper of prednisone to take as directed.  Briefly reviewed all taper instructions with the patient.  He was advised to call/return or go directly to the emergency department for any worsening symptoms whatsoever.

## 2016-03-26 ENCOUNTER — Ambulatory Visit
Admission: RE | Admit: 2016-03-26 | Discharge: 2016-03-26 | Disposition: A | Discharge status: Home / Self Care | Payer: Medicare Other | Source: Ambulatory Visit | Attending: Radiation Oncology | Admitting: Radiation Oncology

## 2016-03-26 ENCOUNTER — Encounter: Payer: Self-pay | Admitting: *Deleted

## 2016-03-26 ENCOUNTER — Other Ambulatory Visit: Payer: Self-pay | Admitting: *Deleted

## 2016-03-26 DIAGNOSIS — C642 Malignant neoplasm of left kidney, except renal pelvis: Secondary | ICD-10-CM | POA: Diagnosis not present

## 2016-03-26 DIAGNOSIS — C7931 Secondary malignant neoplasm of brain: Secondary | ICD-10-CM

## 2016-03-26 DIAGNOSIS — C7949 Secondary malignant neoplasm of other parts of nervous system: Principal | ICD-10-CM

## 2016-03-26 NOTE — Progress Notes (Signed)
Instructed by Abelina Bachelor, RN to call patient to get an update on blood glucose level (he left voicemail previously). No answer. RN left voicemail to return call.

## 2016-03-29 ENCOUNTER — Ambulatory Visit
Admission: RE | Admit: 2016-03-29 | Discharge: 2016-03-29 | Disposition: A | Discharge status: Home / Self Care | Payer: Medicare Other | Source: Ambulatory Visit | Attending: Radiation Oncology | Admitting: Radiation Oncology

## 2016-03-29 ENCOUNTER — Other Ambulatory Visit (HOSPITAL_BASED_OUTPATIENT_CLINIC_OR_DEPARTMENT_OTHER): Payer: Medicare Other

## 2016-03-29 ENCOUNTER — Encounter: Payer: Self-pay | Admitting: Radiation Oncology

## 2016-03-29 ENCOUNTER — Ambulatory Visit (HOSPITAL_BASED_OUTPATIENT_CLINIC_OR_DEPARTMENT_OTHER): Payer: Medicare Other | Admitting: Internal Medicine

## 2016-03-29 ENCOUNTER — Telehealth: Payer: Self-pay | Admitting: Internal Medicine

## 2016-03-29 ENCOUNTER — Encounter: Payer: Self-pay | Admitting: Internal Medicine

## 2016-03-29 VITALS — BP 123/68 | HR 102 | Temp 97.8°F | Ht 71.0 in | Wt 232.6 lb

## 2016-03-29 VITALS — BP 126/62 | HR 96 | Temp 97.5°F | Resp 18 | Ht 71.0 in | Wt 232.2 lb

## 2016-03-29 DIAGNOSIS — R042 Hemoptysis: Secondary | ICD-10-CM

## 2016-03-29 DIAGNOSIS — C7931 Secondary malignant neoplasm of brain: Secondary | ICD-10-CM

## 2016-03-29 DIAGNOSIS — C771 Secondary and unspecified malignant neoplasm of intrathoracic lymph nodes: Secondary | ICD-10-CM

## 2016-03-29 DIAGNOSIS — C7802 Secondary malignant neoplasm of left lung: Secondary | ICD-10-CM | POA: Diagnosis not present

## 2016-03-29 DIAGNOSIS — I10 Essential (primary) hypertension: Secondary | ICD-10-CM

## 2016-03-29 DIAGNOSIS — J410 Simple chronic bronchitis: Secondary | ICD-10-CM

## 2016-03-29 DIAGNOSIS — J449 Chronic obstructive pulmonary disease, unspecified: Secondary | ICD-10-CM

## 2016-03-29 DIAGNOSIS — C642 Malignant neoplasm of left kidney, except renal pelvis: Secondary | ICD-10-CM

## 2016-03-29 DIAGNOSIS — J189 Pneumonia, unspecified organism: Secondary | ICD-10-CM

## 2016-03-29 DIAGNOSIS — Z5111 Encounter for antineoplastic chemotherapy: Secondary | ICD-10-CM

## 2016-03-29 LAB — CBC WITH DIFFERENTIAL/PLATELET
BASO%: 0.4 % (ref 0.0–2.0)
BASOS ABS: 0.1 10*3/uL (ref 0.0–0.1)
EOS%: 5 % (ref 0.0–7.0)
Eosinophils Absolute: 0.6 10*3/uL — ABNORMAL HIGH (ref 0.0–0.5)
HEMATOCRIT: 36.3 % — AB (ref 38.4–49.9)
HEMOGLOBIN: 11.7 g/dL — AB (ref 13.0–17.1)
LYMPH#: 1.8 10*3/uL (ref 0.9–3.3)
LYMPH%: 14.3 % (ref 14.0–49.0)
MCH: 29 pg (ref 27.2–33.4)
MCHC: 32.2 g/dL (ref 32.0–36.0)
MCV: 89.9 fL (ref 79.3–98.0)
MONO#: 0.9 10*3/uL (ref 0.1–0.9)
MONO%: 6.8 % (ref 0.0–14.0)
NEUT#: 9.2 10*3/uL — ABNORMAL HIGH (ref 1.5–6.5)
NEUT%: 73.5 % (ref 39.0–75.0)
PLATELETS: 232 10*3/uL (ref 140–400)
RBC: 4.04 10*6/uL — ABNORMAL LOW (ref 4.20–5.82)
RDW: 13.8 % (ref 11.0–14.6)
WBC: 12.6 10*3/uL — ABNORMAL HIGH (ref 4.0–10.3)

## 2016-03-29 LAB — COMPREHENSIVE METABOLIC PANEL
ALBUMIN: 3 g/dL — AB (ref 3.5–5.0)
ALK PHOS: 89 U/L (ref 40–150)
ALT: 10 U/L (ref 0–55)
ANION GAP: 9 meq/L (ref 3–11)
AST: 12 U/L (ref 5–34)
BILIRUBIN TOTAL: 0.46 mg/dL (ref 0.20–1.20)
BUN: 26.4 mg/dL — AB (ref 7.0–26.0)
CALCIUM: 9.6 mg/dL (ref 8.4–10.4)
CHLORIDE: 103 meq/L (ref 98–109)
CO2: 24 mEq/L (ref 22–29)
CREATININE: 1.3 mg/dL (ref 0.7–1.3)
EGFR: 56 mL/min/{1.73_m2} — ABNORMAL LOW (ref >90)
Glucose: 271 mg/dl — ABNORMAL HIGH (ref 70–140)
Potassium: 4.6 mEq/L (ref 3.5–5.1)
Sodium: 136 mEq/L (ref 136–145)
Total Protein: 6.8 g/dL (ref 6.4–8.3)

## 2016-03-29 LAB — LACTATE DEHYDROGENASE: LDH: 185 U/L (ref 125–245)

## 2016-03-29 MED ORDER — LIDOCAINE VISCOUS 2 % MT SOLN: OROMUCOSAL | 5 refills | Status: DC

## 2016-03-29 MED ORDER — SUCRALFATE 1 G PO TABS: ORAL_TABLET | ORAL | 5 refills | Status: DC

## 2016-03-29 MED ORDER — SUNITINIB MALATE 37.5 MG PO CAPS: 37.5000 mg | ORAL_CAPSULE | Freq: Every day | ORAL | 3 refills | Status: DC

## 2016-03-29 MED FILL — LIDOCAINE 2% VISCOUS SOLN: 2 | 5 days supply | Qty: 100 | Fill #0

## 2016-03-29 MED FILL — SUCRALFATE 1 GM TABLET: 1 | 15 days supply | Qty: 60 | Fill #0

## 2016-03-29 NOTE — Progress Notes (Signed)
[place] [phone]   Fax:([phone]) [place], [place] 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 The patient has disease recurrence with metastasis in the left lower lobe as well as left hilar and mediastinal lymphadenopathy, bilateral pulmonary nodules and solitary metastatic brain lesion diagnosed in March 2017.  PRIOR THERAPY: 1) Stereotactic radiotherapy to the solitary brain lesion scheduled for 08/20/2015. 2)  Votrient 800 mg by mouth daily started 08/08/2015. His dose will be reduced to Votrient 600 mg by mouth daily starting 10/09/2015. 3) Nivolumab 240 MG IV every 2 weeks. First dose 01/13/2016. Status post 4 cycles, discontinued secondary to disease progression.  CURRENT THERAPY: 1) palliative radiotherapy to the enlarging left lower lobe lung mass under the care of Dr. Isidore Moos. 2) the patient will start systemic therapy with Sutent 37.5 mg by mouth daily and the next few days.  INTERVAL HISTORY: Patrick Breed Sr. 70 y.o. male returns to the clinic today for follow-up visit. This is a very pleasant 70 years old white male who was diagnosed with metastatic renal cell carcinoma in March 2017 and underwent several treatments including stereotactic radiotherapy to solitary brain lesion, treatment with Votrient but was unable to tolerate the treatment. He was also treated with 4 cycles of immunotherapy with Nivolumab discontinued secondary to disease progression. Has been complaining of increasing shortness breath and hemoptysis recently. I referred him to Dr. Isidore Moos for consideration of palliative radiotherapy to the enlarging left lower lobe lung mass and he started the treatment few days ago. He received 3 fractions of radiation so far. He was also treated  recently with Medrol Dosepak and Levaquin for worsening dyspnea. He is feeling much better after starting the steroid treatment. He denied having any current hemoptysis. He continues to have shortness breath with exertion. He continues to have cough with soreness in the chest from the coughing. He also has 2 days of cold sweats after starting steroids .His weight has been stable. The patient also has occasional headache with dizzy spells. He is here today for evaluation and discussion of his treatment options.  MEDICAL HISTORY: Past Medical History:  Diagnosis Date  . AAA (abdominal aortic aneurysm) ([place])   . Anxiety   . Asthma   . Cancer [facility])    Renal Cell- 2013; Lung 2017  . Chronic kidney disease    Renal Cell Adenocarcinoma  . Chronic kidney disease    Left Nephrectomy  . COPD (chronic obstructive pulmonary disease) ([place])   . Coronary artery disease   . Dehydration 12/16/2015  . Depression   . Diabetes mellitus without complication ([place])    Type II  . Eczema   . Encounter for antineoplastic chemotherapy 08/19/2015  . Encounter for antineoplastic immunotherapy 01/06/2016  . GERD (gastroesophageal reflux disease)   . Headache   . Heart murmur   . HPTH (hyperparathyroidism) ([place])   . Hx of radiation therapy 08/20/15   Right Temporal brain  . Hyperlipidemia   . Hyperparathyroidism ([place])   . Hypertension   . Hypertension 08/19/2015  . Hypothyroidism   . Malignant neoplasm of left kidney ([place]) 02/09/2013  . Melena 03/17/2016  . Neuropathy ([place])   . Shortness of breath dyspnea   . Sleep apnea    CPAP  . Thoracic aortic aneurysm ([place]) 2017  ALLERGIES:  is allergic to amoxicillin; codeine; penicillins; and ace inhibitors.  MEDICATIONS:  Current Outpatient Prescriptions  Medication Sig Dispense Refill  . ALPRAZolam (XANAX) 0.5 MG tablet Take 0.5 mg by mouth 2 (two) times daily.    Marland Kitchen amLODipine (NORVASC) 10 MG tablet Take 10 mg by mouth daily.    Marland Kitchen aspirin EC 81 MG tablet  Take 1 tablet (81 mg total) by mouth at bedtime.    . calcium carbonate (OSCAL) 1500 (600 Ca) MG TABS tablet Take 600 mg of elemental calcium by mouth 2 (two) times daily with a meal.    . cholecalciferol (VITAMIN D) 1000 units tablet Take 1,000 Units by mouth at bedtime.    . docusate sodium (COLACE) 100 MG capsule Take 100 mg by mouth 2 (two) times daily.    . feeding supplement, ENSURE ENLIVE, (ENSURE ENLIVE) LIQD Take 237 mLs by mouth 2 (two) times daily between meals. 237 mL 12  . FeFum-FePoly-FA-B Cmp-C-Biot (INTEGRA PLUS) CAPS Take 1 capsule by mouth every morning. 30 capsule 2  . FLUoxetine (PROZAC) 40 MG capsule Take 40 mg by mouth daily.    . Fluticasone-Salmeterol (ADVAIR) 100-50 MCG/DOSE AEPB Inhale 1 puff into the lungs 2 (two) times daily.    . meclizine (ANTIVERT) 25 MG tablet Take 25 mg by mouth 3 (three) times daily as needed for dizziness.     . mometasone (ELOCON) 0.1 % cream Apply 1 application topically 2 (two) times daily as needed (for itching).    . Omeprazole 20 MG TBEC Take 20 mg by mouth.    . predniSONE (DELTASONE) 10 MG tablet Take 60 mg PO QD x 4 days; then decrease to 40 mg QD x 4 days; then 20 mg QD x 4 days; then 10 mg QD till gone. 60 tablet 0  . PROAIR HFA 108 (90 Base) MCG/ACT inhaler INHALE 1 OR 2 PUFFS AS NEEDED 8.5 g 11  . prochlorperazine (COMPAZINE) 10 MG tablet TAKE 1 TABLET EVERY 6 HOURS AS NEEDED FOR NAUSEA AND VOMITING 30 tablet 0  . levofloxacin (LEVAQUIN) 500 MG tablet Take 1 tablet (500 mg total) by mouth daily. (Patient not taking: Reported on 03/29/2016) 10 tablet 0  . methylPREDNISolone (MEDROL DOSEPAK) 4 MG TBPK tablet   0   Current Facility-Administered Medications  Medication Dose Route Frequency Provider Last Rate Last Dose  . levalbuterol (XOPENEX) nebulizer solution 1.25 mg  1.25 mg Nebulization Once Mar Daring, PA-C        SURGICAL HISTORY:  Past Surgical History:  Procedure Laterality Date  . BACK SURGERY     compressed  vertebrae-put in a plate to seprate the spaces  . CARDIAC CATHETERIZATION    . CERVICAL FUSION    . CORONARY ARTERY BYPASS GRAFT     x 5  . ENDOBRONCHIAL ULTRASOUND N/A 06/30/2015   Procedure: ENDOHIAL ULTRASOUND;  Surgeon: Flora Lipps, MD;  Location: [facility] ORS;  Service: Cardiopulmonary;  Laterality: N/A;  . FRACTURE SURGERY     left arm repair  . KNEE SURGERY     left knee repair  . PARATHYROIDECTOMY  12/17/09  . VIDEO BRONCHOSCOPY WITH ENDOBRONCHIAL ULTRASOUND N/A 07/09/2015   Procedure: VIDEO BRONCHOSCOPY WITH ENDOBRONCHIAL ULTRASOUND;  Surgeon: Ivin Poot, MD;  Location: [facility] OR;  Service: Thoracic;  Laterality: N/A;    REVIEW OF SYSTEMS:  Constitutional: positive for fatigue and night sweats Eyes: negative for icterus, irritation and redness Ears, nose, mouth, throat, and face: negative for hoarseness, sore mouth and sore throat  Respiratory: positive for cough, dyspnea on exertion, pleurisy/chest pain and sputum Cardiovascular: negative for orthopnea and palpitations Gastrointestinal: negative for constipation, diarrhea, nausea and vomiting Genitourinary:negative for dysuria, frequency and hematuria Integument/breast: negative Hematologic/lymphatic: negative for bleeding and easy bruising Musculoskeletal:negative Neurological: positive for coordination problems, dizziness and weakness Behavioral/Psych: negative Endocrine: negative Allergic/Immunologic: negative   PHYSICAL EXAMINATION: General appearance: alert, cooperative, fatigued and no distress Head: Normocephalic, without obvious abnormality, atraumatic Neck: no adenopathy, no JVD, supple, symmetrical, trachea midline and thyroid not enlarged, symmetric, no tenderness/mass/nodules Lymph nodes: Cervical, supraclavicular, and axillary nodes normal. Resp: diminished breath sounds LLL and dullness to percussion LLL Back: symmetric, no curvature. ROM normal. No CVA tenderness. Cardio: regular rate and rhythm, S1, S2 normal,  no murmur, click, rub or gallop GI: soft, non-tender; bowel sounds normal; no masses,  no organomegaly Extremities: extremities normal, atraumatic, no cyanosis or edema Neurologic: Alert and oriented X 3, normal strength and tone. Normal symmetric reflexes. Normal coordination and gait  ECOG PERFORMANCE STATUS: 1 - Symptomatic but completely ambulatory  Blood pressure 126/62, pulse 96, temperature 97.5 F (36.4 C), temperature source Oral, resp. rate 18, height '5\' 11"'$  (1.803 m), weight 232 lb 3.2 oz (105.3 kg), SpO2 98 %.  LABORATORY DATA: Lab Results  Component Value Date   WBC 12.6 (H) 03/29/2016   HGB 11.7 (L) 03/29/2016   HCT 36.3 (L) 03/29/2016   MCV 89.9 03/29/2016   PLT 232 03/29/2016      Chemistry      Component Value Date/Time   NA 136 03/29/2016 1121   K 4.6 03/29/2016 1121   CL 103 03/04/2016 0444   CL 103 01/19/2012 0834   CO2 24 03/29/2016 1121   BUN 26.4 (H) 03/29/2016 1121   CREATININE 1.3 03/29/2016 1121   GLU 159 11/21/2013      Component Value Date/Time   CALCIUM 9.6 03/29/2016 1121   ALKPHOS 89 03/29/2016 1121   AST 12 03/29/2016 1121   ALT 10 03/29/2016 1121   BILITOT 0.46 03/29/2016 1121       RADIOGRAPHIC STUDIES: Ct Chest W Contrast  Result Date: 03/08/2016 CLINICAL DATA:  Renal cell carcinoma. EXAM: CT CHEST, ABDOMEN, AND PELVIS WITH CONTRAST TECHNIQUE: Multidetector CT imaging of the chest, abdomen and pelvis was performed following the standard protocol during bolus administration of intravenous contrast. CONTRAST:  53m ISOVUE-300 IOPAMIDOL (ISOVUE-300) INJECTION 61%, 732mISOVUE-300 IOPAMIDOL (ISOVUE-300) INJECTION 61% COMPARISON:  CT chest 01/02/2016 CT abdomen and pelvis 12/22/2015 FINDINGS: CT CHEST FINDINGS Cardiovascular: Normal heart size. Previous median sternotomy and CABG procedure. Aortic atherosclerosis noted. There is no pericardial effusion. Mediastinum/Nodes: The trachea appears patent and midline. Unremarkable appearance of the  esophagus. Left paratracheal lymph node measures 3 cm short axis, image 26 of series 2. Previously 2 cm. Adjacent AP window lymph node has a short axis of 2.8 cm, image 26 of series 2. Previously 2.5 cm. Tumor is now seen extending into the left mainstem bronchus region, image 95 of series 6. Index sub- carinal lymph node measures 2.9 cm, image 34 of series 2. Previously 1.9 cm index left hilar node measures 1.8 cm, image 38 of series 2. Previously 1.5 cm. Lungs/Pleura: No pleural effusion identified. Left lower lobe lung mass measures 4.7 x 5.4 cm, image 84 of series 5. Previously 2.9 x 4.8 cm. Tumor extends into the left hilar region and there is a new endobronchial component to the lingula, image 84 of series 5. Increase in size of lingular nodule which measures 11 mm, image 87  of series 5. Previously 8 mm. Perifissural nodule within the right lower lobe measures 9 mm, image 72 of series 5. Previously 4 mm. There is a small right upper lobe lung nodule which measures 7 mm, image 44 of series 5. Previously 4 mm. Left apical nodule measures 9 mm, image 29 of series 5. Previously 5 mm. Musculoskeletal: No chest wall mass or suspicious bone lesions identified. CT ABDOMEN PELVIS FINDINGS Hepatobiliary: Stable small low density structure within the medial segment of left lobe measuring 4 mm. The gallbladder is normal. No biliary dilatation. Pancreas: Solid enhancing lesion within the tail of pancreas is new measuring 2.4 cm, image 74 of series 2. No pancreatic duct dilatation. No inflammation identified. Spleen: Normal in size without focal abnormality. Adrenals/Urinary Tract: Nodule in the right adrenal gland measures 2.7 cm, image 64 of series 2. New from 12/22/2015. There is a new nodule within the left adrenal gland which measures 1.4 cm, image 70 of series 2. The right kidney is normal. No mass or hydronephrosis. Status post left nephrectomy. Stomach/Bowel: Stomach is within normal limits. Appendix appears normal.  No evidence of bowel wall thickening, distention, or inflammatory changes. Vascular/Lymphatic: Aortic atherosclerosis noted. Enlarged periaortic lymph node Measures 1.3 cm, image 79 of series 2. New from previous exam. No pelvic or inguinal adenopathy. a Reproductive: Prostate is unremarkable. Other: There is a small periumbilical hernia which contains a nonobstructed loop of small bowel, image 97 of series 2. Musculoskeletal: No aggressive lytic or sclerotic bone lesions. Degenerative disc disease is identified at L2-3. IMPRESSION: 1. Interval progression of disease. 2. When compared with 01/02/2016 there has been increase of thoracic nodal metastasis and pulmonary nodules/masses within both lungs. Tumor within knee left lower lobe/left hilar region can now be seen extending into the left mainstem bronchus. 3. When compared with the previous CT of the abdomen and pelvis from 12/22/2015 there is new metastasis to the tail of pancreas and right adrenal gland. There is also a new enlarged periaortic lymph node. Electronically Signed   By: Kerby Moors M.D.   On: 03/08/2016 15:37   Ct Angio Chest Pe W And/or Wo Contrast  Result Date: 03/02/2016 CLINICAL DATA:  History of renal cell adenocarcinoma with cold for 2-3 weeks, known prior lung mass, initial encounter EXAM: CT ANGIOGRAPHY CHEST WITH CONTRAST TECHNIQUE: Multidetector CT imaging of the chest was performed using the standard protocol during bolus administration of intravenous contrast. Multiplanar CT image reconstructions and MIPs were obtained to evaluate the vascular anatomy. CONTRAST:  90 mL Isovue 370. COMPARISON:  01/02/2016 FINDINGS: Cardiovascular: The thoracic aorta shows atherosclerotic calcifications and changes of prior coronary bypass grafting similar to that seen on the prior exam. Dilatation of the ascending aorta is again identified to approximately 5 cm. This is stable when compared with the prior exam. Cardiac structures are within normal  limits. Heavy coronary calcifications are noted. The pulmonary artery demonstrates a normal branching pattern. Some attenuation of the branches on the left is noted due to mass effect although no definitive pulmonary emboli are seen. Similar findings are noted on the right but to a lesser degree. Mediastinum/Nodes: Better visualized due to the administration of IV contrast is a large conglomeration of lymphadenopathy within the AP window measuring approximately 5.6 x 5.9 cm. This shows some increase in bulk when compared with the prior exam and some central decreased attenuation suggestive of necrosis. Bulky left hilar lymph nodes are noted also increased in size from the prior exam. The largest of these  measures approximately 2.3 cm in short axis. The subcarinal adenopathy measures 3.4 cm in short axis increased from 1.9 cm on the prior exam. Increasing right hilar adenopathy is noted. The largest area measures approximately 2 cm in short axis best seen on image number 155 of series 10. Lungs/Pleura: The nodule seen in the left upper lobe has increased in size now measuring approximately 6 mm. The lingular nodule seen previously now measures approximately 10 mm increased in size from the prior exam. Previously seen left lower lobe mass has also increased in size now measuring approximately 5.5 by 5.1 cm. The predominant growth is noted from right to left when compared with the prior exam. A few smaller nodules are noted within the left lower lobe also consistent with progression of disease. The right lung remains clear with the exception of enlargement along the major fissure best seen on image number 43 of series 11 now measuring almost 10 mm increased from 4 mm. Upper Abdomen: Visualized upper abdomen shows changes consistent with prior left nephrectomy. No new focal abnormality is seen. Musculoskeletal: Postsurgical changes are noted in the lower cervical spine. No compression deformities are seen. Mild  degenerative changes noted within the thoracic spine. No definitive lytic or sclerotic lesion is identified to suggest metastatic disease. 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 The T7 sclerotic lesions seen previously is stable and again likely represents a bone island. Review of the MIP images confirms the above findings. IMPRESSION: Significant progression in the degree of mediastinal and hilar lymphadenopathy bilaterally. Additionally progression in the dominant left lower lobe mass lesion as well as multiple scattered nodules throughout both lungs. This is consistent with progression of metastatic disease. No evidence of pulmonary emboli although there are findings of attenuation/compression of the pulmonary arterial branches particularly on the left secondary to hilar adenopathy and mediastinal adenopathy Stable ascending aortic dilatation. Ascending thoracic aortic aneurysm. Recommend semi-annual imaging followup by CTA or MRA and referral to cardiothoracic surgery if not already obtained. This recommendation follows 2010 ACCF/AHA/AATS/ACR/ASA/SCA/SCAI/SIR/STS/SVM Guidelines for the Diagnosis and Management of Patients With Thoracic Aortic Disease. Circulation. 2010; 121: W295-A213 Electronically Signed   By: Inez Catalina M.D.   On: 03/02/2016 17:33   Ct Abdomen Pelvis W Contrast  Result Date: 03/08/2016 CLINICAL DATA:  Renal cell carcinoma. EXAM: CT CHEST, ABDOMEN, AND PELVIS WITH CONTRAST TECHNIQUE: Multidetector CT imaging of the chest, abdomen and pelvis was performed following the standard protocol during bolus administration of intravenous contrast. CONTRAST:  22m ISOVUE-300 IOPAMIDOL (ISOVUE-300) INJECTION 61%, 733mISOVUE-300 IOPAMIDOL (ISOVUE-300) INJECTION 61% COMPARISON:  CT chest 01/02/2016 CT abdomen and pelvis 12/22/2015 FINDINGS: CT CHEST FINDINGS Cardiovascular: Normal heart size. Previous median sternotomy and CABG procedure. Aortic atherosclerosis noted. There  is no pericardial effusion. Mediastinum/Nodes: The trachea appears patent and midline. Unremarkable appearance of the esophagus. Left paratracheal lymph node measures 3 cm short axis, image 26 of series 2. Previously 2 cm. Adjacent AP window lymph node has a short axis of 2.8 cm, image 26 of series 2. Previously 2.5 cm. Tumor is now seen extending into the left mainstem bronchus region, image 95 of series 6. Index sub- carinal lymph node measures 2.9 cm, image 34 of series 2. Previously 1.9 cm index left hilar node measures 1.8 cm, image 38 of series 2. Previously 1.5 cm. Lungs/Pleura: No pleural effusion identified. Left lower lobe lung mass measures 4.7 x 5.4 cm, image 84 of series 5. Previously 2.9 x 4.8 cm. Tumor extends into the left hilar  region and there is a new endobronchial component to the lingula, image 84 of series 5. Increase in size of lingular nodule which measures 11 mm, image 87 of series 5. Previously 8 mm. Perifissural nodule within the right lower lobe measures 9 mm, image 72 of series 5. Previously 4 mm. There is a small right upper lobe lung nodule which measures 7 mm, image 44 of series 5. Previously 4 mm. Left apical nodule measures 9 mm, image 29 of series 5. Previously 5 mm. Musculoskeletal: No chest wall mass or suspicious bone lesions identified. CT ABDOMEN PELVIS FINDINGS Hepatobiliary: Stable small low density structure within the medial segment of left lobe measuring 4 mm. The gallbladder is normal. No biliary dilatation. Pancreas: Solid enhancing lesion within the tail of pancreas is new measuring 2.4 cm, image 74 of series 2. No pancreatic duct dilatation. No inflammation identified. Spleen: Normal in size without focal abnormality. Adrenals/Urinary Tract: Nodule in the right adrenal gland measures 2.7 cm, image 64 of series 2. New from 12/22/2015. There is a new nodule within the left adrenal gland which measures 1.4 cm, image 70 of series 2. The right kidney is normal. No mass or  hydronephrosis. Status post left nephrectomy. Stomach/Bowel: Stomach is within normal limits. Appendix appears normal. No evidence of bowel wall thickening, distention, or inflammatory changes. Vascular/Lymphatic: Aortic atherosclerosis noted. Enlarged periaortic lymph node Measures 1.3 cm, image 79 of series 2. New from previous exam. No pelvic or inguinal adenopathy. a Reproductive: Prostate is unremarkable. Other: There is a small periumbilical hernia which contains a nonobstructed loop of small bowel, image 97 of series 2. Musculoskeletal: No aggressive lytic or sclerotic bone lesions. Degenerative disc disease is identified at L2-3. IMPRESSION: 1. Interval progression of disease. 2. When compared with 01/02/2016 there has been increase of thoracic nodal metastasis and pulmonary nodules/masses within both lungs. Tumor within knee left lower lobe/left hilar region can now be seen extending into the left mainstem bronchus. 3. When compared with the previous CT of the abdomen and pelvis from 12/22/2015 there is new metastasis to the tail of pancreas and right adrenal gland. There is also a new enlarged periaortic lymph node. Electronically Signed   By: Kerby Moors M.D.   On: 03/08/2016 15:37    ASSESSMENT AND PLAN: This is a very pleasant 70 years old with: 1) metastatic renal cell carcinoma status post treatment with Votrient and Nivolumab but unfortunately continues to have disease progression. The patient has a very complicated condition. He is currently undergoing palliative radiotherapy to the left lower lobe lung mass. I had a lengthy discussion with the patient today about his condition and current treatment options. I gave the patient the option of palliative care referral versus consideration of palliative systemic therapy with Sutent 37.5 mg by mouth daily. I discussed with the patient the adverse effect of this treatment. He is interested to proceed with the treatment with Sutent and I will  send his prescription to [facility]. He is expected to start this treatment in the next few days. 2) recurrent pneumonia and hemoptysis: He was recently treated with Levaquin and Medrol Dosepak. He is feeling a little bit better. Recommended for him to continue the palliative radiotherapy to the left lower lobe lung mass. 3) COPD: He will continue treatment with bone air, Xopenex and Advair as prescribed by his pulmonologist. 4) hypertension: It is better controlled at this point. He will continue treatment with Norvasc. I will continue to monitor his blood pressure  closely after starting treatment with Sutent because of the risk of increase of his blood pressure. The patient would come back for follow-up visit in 3 weeks for evaluation and management of any adverse effect of his treatment. He was advised to call immediately if he has any concerning symptoms in the interval. The patient voices understanding of current disease status and treatment options and is in agreement with the current care plan.  All questions were answered. The patient knows to call the clinic with any problems, questions or concerns. We can certainly see the patient much sooner if necessary.  Disclaimer: This note was dictated with voice recognition software. Similar sounding words can inadvertently be transcribed and may not be corrected upon review.

## 2016-03-29 NOTE — Progress Notes (Signed)
Pt here for patient teaching.  Pt given Radiation and You booklet and skin care instructions. Pt reports they have watched the Radiation Therapy Education video at home. He is not sure when.  Reviewed areas of pertinence such as fatigue, hair loss, skin changes and throat changes . Pt able to give teach back of to pat skin, use unscented/gentle soap and drink plenty of water. Pt verbalizes understanding of information given and will contact nursing with any questions or concerns.     [email or web address]

## 2016-03-29 NOTE — Telephone Encounter (Signed)
Appointments scheduled per 03/29/16 los. A copy of the AVS report and appointment schedule was given to the patient, per 03/29/16 los.

## 2016-03-29 NOTE — Progress Notes (Signed)
[name] presents for his 4th fraction of radiation to his Left Lung. He denies pain. He has some fatigue, and gets tired after activities. He denies problems swallowing or pain. He does have a continued cough and congestion, but has finished antibiotics and steroids. He saw Dr. Earlie Server today and will start oral chemotherapy today. His skin is normal appearing at his radiation site.   BP 123/68   Pulse (!) 102   Temp 97.8 F (36.6 C)   Ht '5\' 11"'$  (1.803 m)   Wt 232 lb 9.6 oz (105.5 kg)   SpO2 96% Comment: room air  BMI 32.44 kg/m    Wt Readings from Last 3 Encounters:  03/29/16 232 lb 9.6 oz (105.5 kg)  03/29/16 232 lb 3.2 oz (105.3 kg)  03/22/16 232 lb (105.2 kg)

## 2016-03-29 NOTE — Progress Notes (Signed)
   Weekly Management Note:  Outpatient    ICD-9-CM ICD-10-CM   1. 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  CBCT/MVCT images/Port film x-rays were reviewed.  The chart was checked. Doing well. Starting a new oral agent tomorrow.  Physical Findings:  height is '5\' 11"'$  (1.803 m) and weight is 232 lb 9.6 oz (105.5 kg). His temperature is 97.8 F (36.6 C). His blood pressure is 123/68 and his pulse is 102 (abnormal). His oxygen saturation is 96%.   Wt Readings from Last 3 Encounters:  03/29/16 232 lb 9.6 oz (105.5 kg)  03/29/16 232 lb 3.2 oz (105.3 kg)  03/22/16 232 lb (105.2 kg)   NAD well appearing, ambulatory  Impression:  The patient is tolerating radiotherapy.  Plan:  Continue radiotherapy as planned.   Rx given as above for future esophagitis ________________________________   Eppie Gibson, M.D.

## 2016-03-30 ENCOUNTER — Other Ambulatory Visit: Payer: Self-pay | Admitting: Internal Medicine

## 2016-03-30 ENCOUNTER — Ambulatory Visit
Admission: RE | Admit: 2016-03-30 | Discharge: 2016-03-30 | Disposition: A | Discharge status: Home / Self Care | Payer: Medicare Other | Source: Ambulatory Visit | Attending: Radiation Oncology | Admitting: Radiation Oncology

## 2016-03-30 ENCOUNTER — Telehealth: Payer: Self-pay | Admitting: Pharmacist

## 2016-03-30 DIAGNOSIS — C642 Malignant neoplasm of left kidney, except renal pelvis: Secondary | ICD-10-CM | POA: Diagnosis not present

## 2016-03-30 DIAGNOSIS — C649 Malignant neoplasm of unspecified kidney, except renal pelvis: Secondary | ICD-10-CM

## 2016-03-30 MED ORDER — PROCHLORPERAZINE MALEATE 10 MG PO TABS: ORAL_TABLET | ORAL | 0 refills | Status: AC

## 2016-03-30 MED FILL — PROCHLORPERAZINE 10 MG TAB: 10 | 7 days supply | Qty: 30 | Fill #0

## 2016-03-30 MED FILL — SUTENT 37.5 MG CAPSULE: 37.5 | 28 days supply | Qty: 28 | Fill #0

## 2016-03-30 NOTE — Telephone Encounter (Signed)
Pt came in office today stating he picked up his compazine refill from the pharmacy on the 27th and lost it. [facility] sent rx request via inbasket confirming pt lost compazine rx. Pt requested another refill through [employer]. rx sent to [facility] per pt request instead of [facility].

## 2016-03-30 NOTE — Telephone Encounter (Signed)
Oral Chemotherapy Pharmacist Encounter  Received notification from [facility] that new prescription for Sutent would require prior authorization. Sutent prescription was e-scribed on 03/29/16. Labs from 03/29/16 reviewed, ok for treatment Vital signs are stable, no issues with high BP on record, noted patient on amlodipine therapy No baseline urine protein obtained, last EKG in Aug 2017 shows QTc 474 msec, no baseline ECHO in chart 01/27/16 TSH WNL  Current medication list in Epic assessed, noted DDI between Sutent and Prozac with risk of increasing QTc.  Case discussed with MD. We will order baseline EKG for patient to be performed this week while patient is already coming to [facility] for radiation treatments. No ECHO will be performed at this time per MD. Urine protein will be performed at next scheduled lab visit.  Patient will receive Sutent 37.'5mg'$  daily as opposed to cycle schedule and is to start the medication as soon as he receives it.  Prior authorization request placed on covermymeds.Vinetta Bergamo [id number] Status is pending.  Oral Oncology Clinic will continue to follow.  Johny Drilling, PharmD, BCPS, BCOP 03/30/2016  8:46 AM Oral Oncology Clinic [phone]

## 2016-03-31 ENCOUNTER — Ambulatory Visit
Admission: RE | Admit: 2016-03-31 | Discharge: 2016-03-31 | Disposition: A | Discharge status: Home / Self Care | Payer: Medicare Other | Source: Ambulatory Visit | Attending: Radiation Oncology | Admitting: Radiation Oncology

## 2016-03-31 ENCOUNTER — Telehealth: Payer: Self-pay | Admitting: *Deleted

## 2016-03-31 DIAGNOSIS — C642 Malignant neoplasm of left kidney, except renal pelvis: Secondary | ICD-10-CM | POA: Diagnosis not present

## 2016-03-31 NOTE — Telephone Encounter (Signed)
EKG faxed to pt's Cardiology office- Dr. Bartholome Bill  ph [phone] Fax [phone]

## 2016-04-01 ENCOUNTER — Telehealth: Payer: Self-pay | Admitting: Medical Oncology

## 2016-04-01 ENCOUNTER — Ambulatory Visit
Admission: RE | Admit: 2016-04-01 | Discharge: 2016-04-01 | Disposition: A | Discharge status: Home / Self Care | Payer: Medicare Other | Source: Ambulatory Visit | Attending: Radiation Oncology | Admitting: Radiation Oncology

## 2016-04-01 DIAGNOSIS — C642 Malignant neoplasm of left kidney, except renal pelvis: Secondary | ICD-10-CM | POA: Diagnosis not present

## 2016-04-01 NOTE — Telephone Encounter (Signed)
Oral Chemotherapy Pharmacist Encounter  EKG performed on 03/31/16 shows QTc 360mec, ok to start Sutent treatment.  PA for Sutent approved, [facility]X notified, copay $0, appears [facility]  Oral Oncology Clinic will continue to follow for start date, initial counseling, toxicity and adherence management.  JJohny Drilling PharmD, BCPS, BCOP 04/01/2016  9:47 AM Oral Oncology Clinic [phone]

## 2016-04-01 NOTE — Telephone Encounter (Signed)
Pt called and asked if he can start Sutent. I told pt he can start Sutent.

## 2016-04-02 ENCOUNTER — Ambulatory Visit
Admission: RE | Admit: 2016-04-02 | Discharge: 2016-04-02 | Disposition: A | Discharge status: Home / Self Care | Payer: Medicare Other | Source: Ambulatory Visit | Attending: Radiation Oncology | Admitting: Radiation Oncology

## 2016-04-02 DIAGNOSIS — C642 Malignant neoplasm of left kidney, except renal pelvis: Secondary | ICD-10-CM | POA: Diagnosis not present

## 2016-04-05 ENCOUNTER — Ambulatory Visit
Admission: RE | Admit: 2016-04-05 | Discharge: 2016-04-05 | Disposition: A | Discharge status: Home / Self Care | Payer: Medicare Other | Source: Ambulatory Visit | Attending: Radiation Oncology | Admitting: Radiation Oncology

## 2016-04-05 ENCOUNTER — Encounter: Payer: Self-pay | Admitting: Radiation Oncology

## 2016-04-05 VITALS — BP 117/80 | HR 97 | Temp 97.9°F | Ht 71.0 in | Wt 229.6 lb

## 2016-04-05 DIAGNOSIS — C7802 Secondary malignant neoplasm of left lung: Secondary | ICD-10-CM

## 2016-04-05 DIAGNOSIS — C642 Malignant neoplasm of left kidney, except renal pelvis: Secondary | ICD-10-CM | POA: Diagnosis not present

## 2016-04-05 MED ORDER — EMOLLIENT BASE EX CREA: TOPICAL_CREAM | CUTANEOUS | 0 refills | Status: DC | PRN

## 2016-04-05 NOTE — Progress Notes (Signed)
   Weekly Management Note:  Outpatient    ICD-9-CM ICD-10-CM   1. 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  CBCT/MVCT images/Port film x-rays were reviewed.  The chart was checked. Noting more esophagitis - will start sucralfate and lidocaine. Feels slight malaise after starting Sutent   Physical Findings:  height is '5\' 11"'$  (1.803 m) and weight is 229 lb 9.6 oz (104.1 kg). His temperature is 97.9 F (36.6 C). His blood pressure is 117/80 and his pulse is 97. His oxygen saturation is 99%.   Wt Readings from Last 3 Encounters:  04/05/16 229 lb 9.6 oz (104.1 kg)  03/29/16 232 lb 9.6 oz (105.5 kg)  03/29/16 232 lb 3.2 oz (105.3 kg)   NAD well appearing, ambulatory, dermatitis w/ scabbing over back  Impression:  The patient is tolerating radiotherapy.  Plan:  Continue radiotherapy as planned.   Sonafine and neosporin recommended for dermatitis over back. F/u after BRAIN MRI in 1 mo. Contact me if pain in esohpagus isn't adequately alleviated by current meds.  ________________________________   Eppie Gibson, M.D.

## 2016-04-05 NOTE — Progress Notes (Signed)
[name] is here for his 9th fraction of radiation to his Left Lung. He denies pain. He does report some heartburn when swallowing. He has not started taking lidocaine rinses or sucralfate yet, but plans to start today. He reports not feeling well. He started a new oral chemotherapy 3 days ago called Sutent. He has not noticed any side effects at this time. He has noticed some itching to his mid and upper Left Back area. On assessment he had dermatitis to this area, with several open areas present. He was provided with biafine cream today, and knows to put neosporin over the open areas to help with healing. He reports to be eating and drinking well. His cough has improved, and is still productive with some yellow sputum.  He has a follow up appointment scheduled to have a MRI reviewed on January 17th.  BP 117/80   Pulse 97   Temp 97.9 F (36.6 C)   Ht '5\' 11"'$  (1.803 m)   Wt 229 lb 9.6 oz (104.1 kg)   SpO2 99% Comment: room air  BMI 32.02 kg/m    Wt Readings from Last 3 Encounters:  04/05/16 229 lb 9.6 oz (104.1 kg)  03/29/16 232 lb 9.6 oz (105.5 kg)  03/29/16 232 lb 3.2 oz (105.3 kg)

## 2016-04-06 ENCOUNTER — Ambulatory Visit: Payer: Medicare Other | Admitting: Internal Medicine

## 2016-04-06 ENCOUNTER — Encounter: Payer: Self-pay | Admitting: Radiation Oncology

## 2016-04-06 ENCOUNTER — Ambulatory Visit
Admission: RE | Admit: 2016-04-06 | Discharge: 2016-04-06 | Disposition: A | Discharge status: Home / Self Care | Payer: Medicare Other | Source: Ambulatory Visit | Attending: Radiation Oncology | Admitting: Radiation Oncology

## 2016-04-06 ENCOUNTER — Other Ambulatory Visit: Payer: Self-pay | Admitting: Family Medicine

## 2016-04-06 ENCOUNTER — Ambulatory Visit: Payer: Medicare Other

## 2016-04-06 ENCOUNTER — Other Ambulatory Visit: Payer: Medicare Other

## 2016-04-06 DIAGNOSIS — C642 Malignant neoplasm of left kidney, except renal pelvis: Secondary | ICD-10-CM | POA: Diagnosis not present

## 2016-04-07 ENCOUNTER — Ambulatory Visit: Payer: Medicare Other

## 2016-04-07 MED ORDER — PROCHLORPERAZINE MALEATE 10 MG PO TABS
ORAL_TABLET | ORAL | Status: AC
  Filled 2016-04-07: qty 1

## 2016-04-08 ENCOUNTER — Ambulatory Visit: Payer: Medicare Other

## 2016-04-09 ENCOUNTER — Ambulatory Visit: Payer: Medicare Other

## 2016-04-09 NOTE — Progress Notes (Signed)
  Radiation Oncology         [phone]) [phone] ________________________________  Name: Patrick Breed Sr. MRN: [id number]  Date: 04/06/2016  DOB: 12-12-1945  End of Treatment Note  Diagnosis:   Progressive metastatic disease in the chest  - renal cell carcinoma    ICD-9-CM ICD-10-CM   1. 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) 197.0 C78.02     Indication for treatment:  Palliative       Radiation treatment dates:   03/24/2016 to 04/06/2016  Site/dose:   The Left lung / mediastinum was treated to 30 Gy in 10 fractions at 3 Gy per fraction.   Beams/energy:   3D // 10X, 15X, 6X  Narrative: The patient tolerated radiation treatment relatively well.  The patient experienced esophagitis and slight malaise with treatment. He planned to start using sucralfate and lidocaine. He developed dermatitis with scabbing over the back.   Plan: The patient has completed radiation treatment. The patient will return to radiation oncology clinic for routine followup in one month. The patient will contact our clinic if esophagus pain is not adequately alleviated by current medications. I advised them to call or return sooner if they have any questions or concerns related to their recovery or treatment.  -----------------------------------  Eppie Gibson, MD   This document serves as a record of services personally performed by Eppie Gibson, MD. It was created on her behalf by Arlyce Harman, a trained medical scribe. The creation of this record is based on the scribe's personal observations and the provider's statements to them. This document has been checked and approved by the attending provider.

## 2016-04-12 ENCOUNTER — Ambulatory Visit: Payer: Medicare Other

## 2016-04-13 ENCOUNTER — Ambulatory Visit: Payer: Medicare Other

## 2016-04-15 ENCOUNTER — Other Ambulatory Visit: Payer: Self-pay | Admitting: Family Medicine

## 2016-04-15 NOTE — Telephone Encounter (Signed)
Pt needs refill on his generic xanax  ALPRAZolam Duanne Moron) 0.5 MG tablet  Pharmacy needed ok on refill  Thanks Con Memos

## 2016-04-15 NOTE — Telephone Encounter (Signed)
Last ov 03/12/16. Please review. Thank you. sd

## 2016-04-15 NOTE — Telephone Encounter (Signed)
rx called into [facility].

## 2016-04-16 ENCOUNTER — Other Ambulatory Visit: Payer: Self-pay | Admitting: Medical Oncology

## 2016-04-16 ENCOUNTER — Telehealth: Payer: Self-pay | Admitting: Medical Oncology

## 2016-04-16 ENCOUNTER — Ambulatory Visit (HOSPITAL_BASED_OUTPATIENT_CLINIC_OR_DEPARTMENT_OTHER): Payer: Medicare Other

## 2016-04-16 ENCOUNTER — Ambulatory Visit (HOSPITAL_BASED_OUTPATIENT_CLINIC_OR_DEPARTMENT_OTHER): Payer: Medicare Other | Admitting: Nurse Practitioner

## 2016-04-16 ENCOUNTER — Ambulatory Visit (HOSPITAL_COMMUNITY)
Admission: RE | Admit: 2016-04-16 | Discharge: 2016-04-16 | Disposition: A | Discharge status: Home / Self Care | Payer: Medicare Other | Source: Ambulatory Visit | Attending: Nurse Practitioner | Admitting: Nurse Practitioner

## 2016-04-16 VITALS — BP 122/88 | HR 106 | Temp 98.0°F | Resp 18 | Ht 71.0 in | Wt 222.0 lb

## 2016-04-16 DIAGNOSIS — J449 Chronic obstructive pulmonary disease, unspecified: Secondary | ICD-10-CM

## 2016-04-16 DIAGNOSIS — R911 Solitary pulmonary nodule: Secondary | ICD-10-CM | POA: Diagnosis not present

## 2016-04-16 DIAGNOSIS — C642 Malignant neoplasm of left kidney, except renal pelvis: Secondary | ICD-10-CM

## 2016-04-16 DIAGNOSIS — C649 Malignant neoplasm of unspecified kidney, except renal pelvis: Secondary | ICD-10-CM | POA: Diagnosis present

## 2016-04-16 DIAGNOSIS — C7802 Secondary malignant neoplasm of left lung: Secondary | ICD-10-CM

## 2016-04-16 DIAGNOSIS — R634 Abnormal weight loss: Secondary | ICD-10-CM | POA: Diagnosis not present

## 2016-04-16 DIAGNOSIS — J40 Bronchitis, not specified as acute or chronic: Secondary | ICD-10-CM

## 2016-04-16 LAB — COMPREHENSIVE METABOLIC PANEL
ALT: 18 U/L (ref 0–55)
ANION GAP: 11 meq/L (ref 3–11)
AST: 30 U/L (ref 5–34)
Albumin: 3.5 g/dL (ref 3.5–5.0)
Alkaline Phosphatase: 97 U/L (ref 40–150)
BILIRUBIN TOTAL: 0.94 mg/dL (ref 0.20–1.20)
BUN: 30.7 mg/dL — AB (ref 7.0–26.0)
CHLORIDE: 101 meq/L (ref 98–109)
CO2: 22 meq/L (ref 22–29)
CREATININE: 1.7 mg/dL — AB (ref 0.7–1.3)
Calcium: 9.1 mg/dL (ref 8.4–10.4)
EGFR: 41 mL/min/{1.73_m2} — ABNORMAL LOW (ref >90)
Glucose: 108 mg/dl (ref 70–140)
Potassium: 4.9 mEq/L (ref 3.5–5.1)
Sodium: 134 mEq/L — ABNORMAL LOW (ref 136–145)
TOTAL PROTEIN: 7.3 g/dL (ref 6.4–8.3)

## 2016-04-16 LAB — CBC WITH DIFFERENTIAL/PLATELET
BASO%: 0.4 % (ref 0.0–2.0)
Basophils Absolute: 0 10*3/uL (ref 0.0–0.1)
EOS%: 2.4 % (ref 0.0–7.0)
Eosinophils Absolute: 0.2 10*3/uL (ref 0.0–0.5)
HCT: 41.6 % (ref 38.4–49.9)
HGB: 13.5 g/dL (ref 13.0–17.1)
LYMPH#: 0.5 10*3/uL — AB (ref 0.9–3.3)
LYMPH%: 5 % — AB (ref 14.0–49.0)
MCH: 28.4 pg (ref 27.2–33.4)
MCHC: 32.4 g/dL (ref 32.0–36.0)
MCV: 87.4 fL (ref 79.3–98.0)
MONO#: 0.6 10*3/uL (ref 0.1–0.9)
MONO%: 6 % (ref 0.0–14.0)
NEUT%: 86.2 % — ABNORMAL HIGH (ref 39.0–75.0)
NEUTROS ABS: 8.9 10*3/uL — AB (ref 1.5–6.5)
PLATELETS: 137 10*3/uL — AB (ref 140–400)
RBC: 4.76 10*6/uL (ref 4.20–5.82)
RDW: 15.9 % — ABNORMAL HIGH (ref 11.0–14.6)
WBC: 10.3 10*3/uL (ref 4.0–10.3)

## 2016-04-16 MED ORDER — METHYLPREDNISOLONE 4 MG PO TBPK: ORAL_TABLET | ORAL | 0 refills | Status: DC

## 2016-04-16 MED ORDER — ALBUTEROL SULFATE (2.5 MG/3ML) 0.083% IN NEBU
2.5000 mg | INHALATION_SOLUTION | Freq: Once | RESPIRATORY_TRACT | Status: AC
  Administered 2016-04-16: 2.5 mg via RESPIRATORY_TRACT
  Filled 2016-04-16: qty 3

## 2016-04-16 MED ORDER — ALBUTEROL SULFATE (2.5 MG/3ML) 0.083% IN NEBU
INHALATION_SOLUTION | RESPIRATORY_TRACT | Status: AC
  Filled 2016-04-16: qty 3

## 2016-04-16 MED ORDER — DOXYCYCLINE HYCLATE 100 MG PO TABS: 100.0000 mg | ORAL_TABLET | Freq: Two times a day (BID) | ORAL | 0 refills | Status: DC

## 2016-04-16 NOTE — Progress Notes (Signed)
1245 pt received albuterol neb with relief of wheezing. Tolerated well. Pt states he felt better afterward.

## 2016-04-16 NOTE — Telephone Encounter (Signed)
productive cough -" old brown , yellow -looking mess"-He states he cannot stop coughing . He has fatigue He is taking sutent. He denies fever or other symptoms . appt today for cxr ,labs and smc

## 2016-04-20 ENCOUNTER — Encounter: Payer: Self-pay | Admitting: Internal Medicine

## 2016-04-20 ENCOUNTER — Telehealth: Payer: Self-pay | Admitting: Internal Medicine

## 2016-04-20 ENCOUNTER — Other Ambulatory Visit (HOSPITAL_BASED_OUTPATIENT_CLINIC_OR_DEPARTMENT_OTHER): Payer: Medicare Other

## 2016-04-20 ENCOUNTER — Other Ambulatory Visit: Payer: Medicare Other

## 2016-04-20 ENCOUNTER — Ambulatory Visit: Payer: Medicare Other

## 2016-04-20 ENCOUNTER — Ambulatory Visit: Payer: Medicare Other | Admitting: Internal Medicine

## 2016-04-20 ENCOUNTER — Encounter: Payer: Self-pay | Admitting: Nurse Practitioner

## 2016-04-20 ENCOUNTER — Ambulatory Visit (HOSPITAL_BASED_OUTPATIENT_CLINIC_OR_DEPARTMENT_OTHER): Payer: Medicare Other | Admitting: Internal Medicine

## 2016-04-20 VITALS — BP 135/94 | HR 99 | Temp 97.6°F | Resp 18 | Ht 71.0 in | Wt 225.2 lb

## 2016-04-20 DIAGNOSIS — C642 Malignant neoplasm of left kidney, except renal pelvis: Secondary | ICD-10-CM | POA: Diagnosis not present

## 2016-04-20 DIAGNOSIS — I10 Essential (primary) hypertension: Secondary | ICD-10-CM | POA: Diagnosis not present

## 2016-04-20 DIAGNOSIS — J40 Bronchitis, not specified as acute or chronic: Secondary | ICD-10-CM | POA: Insufficient documentation

## 2016-04-20 DIAGNOSIS — C7931 Secondary malignant neoplasm of brain: Secondary | ICD-10-CM

## 2016-04-20 DIAGNOSIS — Z5111 Encounter for antineoplastic chemotherapy: Secondary | ICD-10-CM

## 2016-04-20 DIAGNOSIS — D630 Anemia in neoplastic disease: Secondary | ICD-10-CM | POA: Diagnosis not present

## 2016-04-20 DIAGNOSIS — C649 Malignant neoplasm of unspecified kidney, except renal pelvis: Secondary | ICD-10-CM

## 2016-04-20 DIAGNOSIS — J410 Simple chronic bronchitis: Secondary | ICD-10-CM

## 2016-04-20 DIAGNOSIS — R634 Abnormal weight loss: Secondary | ICD-10-CM | POA: Insufficient documentation

## 2016-04-20 LAB — COMPREHENSIVE METABOLIC PANEL
ALBUMIN: 3.3 g/dL — AB (ref 3.5–5.0)
ALT: 34 U/L (ref 0–55)
ANION GAP: 9 meq/L (ref 3–11)
AST: 40 U/L — AB (ref 5–34)
Alkaline Phosphatase: 111 U/L (ref 40–150)
BILIRUBIN TOTAL: 0.46 mg/dL (ref 0.20–1.20)
BUN: 53.1 mg/dL — AB (ref 7.0–26.0)
CALCIUM: 10 mg/dL (ref 8.4–10.4)
CHLORIDE: 104 meq/L (ref 98–109)
CO2: 24 mEq/L (ref 22–29)
CREATININE: 1.6 mg/dL — AB (ref 0.7–1.3)
EGFR: 42 mL/min/{1.73_m2} — ABNORMAL LOW (ref >90)
Glucose: 126 mg/dl (ref 70–140)
Potassium: 5.1 mEq/L (ref 3.5–5.1)
Sodium: 137 mEq/L (ref 136–145)
TOTAL PROTEIN: 7 g/dL (ref 6.4–8.3)

## 2016-04-20 LAB — CBC WITH DIFFERENTIAL/PLATELET
BASO%: 0 % (ref 0.0–2.0)
Basophils Absolute: 0 10*3/uL (ref 0.0–0.1)
EOS ABS: 0 10*3/uL (ref 0.0–0.5)
EOS%: 0.2 % (ref 0.0–7.0)
HEMATOCRIT: 37.8 % — AB (ref 38.4–49.9)
HEMOGLOBIN: 12.9 g/dL — AB (ref 13.0–17.1)
LYMPH#: 0.3 10*3/uL — AB (ref 0.9–3.3)
LYMPH%: 5.5 % — AB (ref 14.0–49.0)
MCH: 29 pg (ref 27.2–33.4)
MCHC: 34.1 g/dL (ref 32.0–36.0)
MCV: 84.9 fL (ref 79.3–98.0)
MONO#: 0.3 10*3/uL (ref 0.1–0.9)
MONO%: 4.8 % (ref 0.0–14.0)
NEUT%: 89.5 % — ABNORMAL HIGH (ref 39.0–75.0)
NEUTROS ABS: 5.2 10*3/uL (ref 1.5–6.5)
NRBC: 0 % (ref 0–0)
PLATELETS: 134 10*3/uL — AB (ref 140–400)
RBC: 4.45 10*6/uL (ref 4.20–5.82)
RDW: 14.7 % — ABNORMAL HIGH (ref 11.0–14.6)
WBC: 5.8 10*3/uL (ref 4.0–10.3)

## 2016-04-20 LAB — LACTATE DEHYDROGENASE: LDH: 336 U/L — AB (ref 125–245)

## 2016-04-20 LAB — UA PROTEIN, DIPSTICK - CHCC

## 2016-04-20 NOTE — Progress Notes (Signed)
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 The patient had disease recurrence with metastasis to the left lower lobe lung as well as left hilar and mediastinal lymphadenopathy as well as bilateral pulmonary nodules and solitary brain lesion in March 2017.  PRIOR THERAPY: 1) Stereotactic radiotherapy to the solitary brain lesion scheduled for 08/20/2015. 2)Votrient 800 mg by mouth daily started 08/08/2015. His dose will be reduced to Votrient 600 mg by mouth daily starting 10/09/2015. 3) Nivolumab 240 MG IV every 2 weeks. First dose 01/13/2016. Status post 4cycles, discontinued secondary to disease progression. 4) palliative radiotherapy to the enlarging left lower lobe lung mass under the care of Dr. Isidore Moos.  CURRENT THERAPY: Systemic therapy with Sutent 37.5 mg by mouth daily. First dose 04/01/2016.  INTERVAL HISTORY: [name]. 70 y.o. male returns to the clinic today for follow-up visit. The patient is feeling much better today. He was seen last week for worsening dyspnea and cough. He was treated with doxycycline and Medrol Dosepak and feeling much better. He denied having any significant chest pain but continues to have shortness breath with exertion with mild cough and no hemoptysis. He has no weight loss or night sweats. He has no fever or chills. He is tolerating his current treatment with Sutent fairly well with no significant adverse effect except for change of his taste to salt. He is here today for evaluation with repeat blood work.  MEDICAL HISTORY: Past Medical History:  Diagnosis Date  . AAA (abdominal aortic aneurysm) ([place])   . Anxiety   . Asthma   . Cancer [facility])    Renal  Cell- 2013; Lung 2017  . Chronic kidney disease    Renal Cell Adenocarcinoma  . Chronic kidney disease    Left Nephrectomy  . COPD (chronic obstructive pulmonary disease) ([place])   . Coronary artery disease   . Dehydration 12/16/2015  . Depression   . Diabetes mellitus without complication ([place])    Type II  . Eczema   . Encounter for antineoplastic chemotherapy 08/19/2015  . Encounter for antineoplastic immunotherapy 01/06/2016  . GERD (gastroesophageal reflux disease)   . Headache   . Heart murmur   . HPTH (hyperparathyroidism) ([place])   . Hx of radiation therapy 08/20/15   Right Temporal brain  . Hyperlipidemia   . Hyperparathyroidism ([place])   . Hypertension   . Hypertension 08/19/2015  . Hypothyroidism   . Malignant neoplasm of left kidney ([place]) 02/09/2013  . Melena 03/17/2016  . Neuropathy ([place])   . Shortness of breath dyspnea   . Sleep apnea    CPAP  . Thoracic aortic aneurysm ([place]) 2017    ALLERGIES:  is allergic to amoxicillin; codeine; penicillins; and ace inhibitors.  MEDICATIONS:  Current Outpatient Prescriptions  Medication Sig Dispense Refill  . ALPRAZolam (XANAX) 0.5 MG tablet TAKE ONE TABLET BY MOUTH TWICE DAILY AS NEEDED 60 tablet 5  . amLODipine (NORVASC) 10 MG tablet Take 10 mg by mouth daily.    Marland Kitchen aspirin EC 81 MG tablet Take 1 tablet (81 mg total) by mouth at bedtime.    . calcium carbonate (OSCAL) 1500 (600 Ca) MG TABS tablet Take 600 mg of elemental calcium by mouth 2 (two) times  daily with a meal.    . cholecalciferol (VITAMIN D) 1000 units tablet Take 1,000 Units by mouth at bedtime.    . docusate sodium (COLACE) 100 MG capsule Take 100 mg by mouth 2 (two) times daily.    Marland Kitchen doxycycline (VIBRA-TABS) 100 MG tablet Take 1 tablet (100 mg total) by mouth 2 (two) times daily. 20 tablet 0  . emollient (BIAFINE) cream Apply topically as needed. 454 g 0  . feeding supplement, ENSURE ENLIVE, (ENSURE ENLIVE) LIQD Take 237 mLs by mouth 2 (two) times daily between  meals. 237 mL 12  . FeFum-FePoly-FA-B Cmp-C-Biot (INTEGRA PLUS) CAPS Take 1 capsule by mouth every morning. 30 capsule 2  . FLUoxetine (PROZAC) 40 MG capsule Take 40 mg by mouth daily.    Marland Kitchen FLUoxetine (PROZAC) 40 MG capsule TAKE ONE CAPSULE BY MOUTH EVERY DAY 90 capsule 3  . Fluticasone-Salmeterol (ADVAIR) 100-50 MCG/DOSE AEPB Inhale 1 puff into the lungs 2 (two) times daily.    Marland Kitchen lidocaine (XYLOCAINE) 2 % solution Mix 1 part 2%viscous lidocaine,1part H2O.Swish and/or swallow 10m of this mixture,385m before meals and at bedtime, up to QID 100 mL 5  . meclizine (ANTIVERT) 25 MG tablet Take 25 mg by mouth 3 (three) times daily as needed for dizziness.     . methylPREDNISolone (MEDROL DOSEPAK) 4 MG TBPK tablet Medrol dose pak: Take as directed. 21 tablet 0  . mometasone (ELOCON) 0.1 % cream Apply 1 application topically 2 (two) times daily as needed (for itching).    . Omeprazole 20 MG TBEC Take 20 mg by mouth.    . Marland KitchenROAIR HFA 108 (90 Base) MCG/ACT inhaler INHALE 1 OR 2 PUFFS AS NEEDED 8.5 g 11  . prochlorperazine (COMPAZINE) 10 MG tablet TAKE 1 TABLET EVERY 6 HOURS AS NEEDED FOR NAUSEA AND VOMITING 30 tablet 0  . sucralfate (CARAFATE) 1 g tablet Dissolve 1 tablet in 1046m2O and swallow up to QID,PRN sore throat. (Patient not taking: Reported on 04/16/2016) 60 tablet 5  . SUNItinib (SUTENT) 37.5 MG capsule Take 1 capsule (37.5 mg total) by mouth daily. 30 capsule 3   Current Facility-Administered Medications  Medication Dose Route Frequency Provider Last Rate Last Dose  . levalbuterol (XOPENEX) nebulizer solution 1.25 mg  1.25 mg Nebulization Once JenMar DaringA-C        SURGICAL HISTORY:  Past Surgical History:  Procedure Laterality Date  . BACK SURGERY     compressed vertebrae-put in a plate to seprate the spaces  . CARDIAC CATHETERIZATION    . CERVICAL FUSION    . CORONARY ARTERY BYPASS GRAFT     x 5  . ENDOBRONCHIAL ULTRASOUND N/A 06/30/2015   Procedure: ENDOHIAL ULTRASOUND;   Surgeon: KurFlora LippsD;  Location: [facility] ORS;  Service: Cardiopulmonary;  Laterality: N/A;  . FRACTURE SURGERY     left arm repair  . KNEE SURGERY     left knee repair  . PARATHYROIDECTOMY  12/17/09  . VIDEO BRONCHOSCOPY WITH ENDOBRONCHIAL ULTRASOUND N/A 07/09/2015   Procedure: VIDEO BRONCHOSCOPY WITH ENDOBRONCHIAL ULTRASOUND;  Surgeon: PetIvin PootD;  [facility];  Laterality: N/A;    REVIEW OF SYSTEMS:  Constitutional: positive for fatigue Eyes: negative Ears, nose, mouth, throat, and face: negative Respiratory: positive for cough and dyspnea on exertion Cardiovascular: negative Gastrointestinal: negative Genitourinary:negative Integument/breast: negative Hematologic/lymphatic: negative Musculoskeletal:negative Neurological: negative Behavioral/Psych: negative Endocrine: negative Allergic/Immunologic: negative   PHYSICAL EXAMINATION: General appearance: alert, cooperative, fatigued and no distress Head: Normocephalic, without  obvious abnormality, atraumatic Neck: no adenopathy, no JVD, supple, symmetrical, trachea midline and thyroid not enlarged, symmetric, no tenderness/mass/nodules Lymph nodes: Cervical, supraclavicular, and axillary nodes normal. Resp: clear to auscultation bilaterally Back: symmetric, no curvature. ROM normal. No CVA tenderness. Cardio: regular rate and rhythm, S1, S2 normal, no murmur, click, rub or gallop GI: soft, non-tender; bowel sounds normal; no masses,  no organomegaly Extremities: extremities normal, atraumatic, no cyanosis or edema Neurologic: Alert and oriented X 3, normal strength and tone. Normal symmetric reflexes. Normal coordination and gait  ECOG PERFORMANCE STATUS: 1 - Symptomatic but completely ambulatory  Blood pressure (!) 135/94, pulse 99, temperature 97.6 F (36.4 C), temperature source Oral, resp. rate 18, height '5\' 11"'$  (1.803 m), weight 225 lb 3.2 oz (102.2 kg), SpO2 100 %.  LABORATORY DATA: Lab  Results  Component Value Date   WBC 5.8 04/20/2016   HGB 12.9 (L) 04/20/2016   HCT 37.8 (L) 04/20/2016   MCV 84.9 04/20/2016   PLT 134 (L) 04/20/2016      Chemistry      Component Value Date/Time   NA 137 04/20/2016 1033   K 5.1 04/20/2016 1033   CL 103 03/04/2016 0444   CL 103 01/19/2012 0834   CO2 24 04/20/2016 1033   BUN 53.1 (H) 04/20/2016 1033   CREATININE 1.6 (H) 04/20/2016 1033   GLU 159 11/21/2013      Component Value Date/Time   CALCIUM 10.0 04/20/2016 1033   ALKPHOS 111 04/20/2016 1033   AST 40 (H) 04/20/2016 1033   ALT 34 04/20/2016 1033   BILITOT 0.46 04/20/2016 1033       RADIOGRAPHIC STUDIES: Dg Chest 2 View  Result Date: 04/16/2016 CLINICAL DATA:  Cough x 2 nights ;No fever;Recent History of antibiotics taken for infection of lining of lungs; Finished radiation to lung on 04/06/16; Quit smoking in 2004; Hx - quadruple bypass in 2004; HTN- on meds; Diabetic; Patient has had acute bronchitis and walking pneumonia in the past EXAM: CHEST - 2 VIEW COMPARISON:  CT 03/08/2016, radiograph 12/14/2015 FINDINGS: 13 mm nodule, left mid lung. Left perihilar and suprahilar disease is less conspicuous than on previous CT. Right lung remains clear. Heart size and mediastinal contours are within normal limits. Tortuous atheromatous aorta. No effusion. Previous CABG.  Cervical fixation hardware noted. IMPRESSION: 1. No acute findings. 2. Left mid lung nodule stable. Left perihilar/suprahilar disease less conspicuous than on recent CT. Electronically Signed   By: Lucrezia Europe M.D.   On: 04/16/2016 11:16    ASSESSMENT AND PLAN: This is a very pleasant 70 years old white male with: 1) metastatic renal cell carcinoma of the left kidney status post several chemotherapy and immunotherapy regimens. He is currently on treatment with Sutent started 3 weeks ago. He is tolerating the treatment well with no significant adverse effects. I recommended for the patient to continue his treatment  as scheduled. I would see him back for follow-up visit in 3 weeks for evaluation and repeat blood work. 2) for hypertension: The patient will continue his current treatment with amlodipine. He was also advised to consult with his primary care physician and cardiologist regarding the abnormal finding on the previous EKG. 3) anemia of chronic disease: Secondary to metastatic renal cell lung cancer. His hemoglobin and hematocrit are stable. We will continue to monitor for now. The patient was advised to call immediately if he has any concerning symptoms in the interval. The patient voices understanding of current disease status and treatment options and is  in agreement with the current care plan.  All questions were answered. The patient knows to call the clinic with any problems, questions or concerns. We can certainly see the patient much sooner if necessary.  I spent 15 minutes counseling the patient face to face. The total time spent in the appointment was 25 minutes.  Disclaimer: This note was dictated with voice recognition software. Similar sounding words can inadvertently be transcribed and may not be corrected upon review.

## 2016-04-20 NOTE — Telephone Encounter (Signed)
Appointments scheduled per 12/26 LOS. Patient given AVS report and calendars with future scheduled appointments.

## 2016-04-20 NOTE — Assessment & Plan Note (Signed)
Patient states that he has had minimal appetite and poor oral intake recently.  His last weight check revealed that he lost approximate 10 pounds recently.  Patient was encouraged to push fluids and protein in his diet is much as possible.

## 2016-04-20 NOTE — Assessment & Plan Note (Addendum)
Patient states that he has had a cold and a very congested, productive cough for the last week or so.  He also, he has had some audible wheezing.  Patient denies any recent fevers or chills.  Exam today revealed some rhonchi and bilateral lungs; with extensive wheezing.  Confirmed the patient are has both an albuterol inhaler and Advair inhaler at home to take as directed.  Reviewed all findings with Dr. [name]; and he recommended that patient be prescribed doxycycline antibiotics for treatment of bronchitis.  Will also prescribe a repeat Medrol Dosepak as well.  Patient was encouraged to increase his use of the albuterol inhaler 1-2 puffs every 6 hours as needed.  Patient also received an albuterol nebulizer treatment while at the cancer center. Patient was advised to call/return or go directly to the emergency department for any worsening symptoms whatsoever.

## 2016-04-20 NOTE — Progress Notes (Signed)
SYMPTOM MANAGEMENT CLINIC    Chief Complaint: Bronchitis  HPI:  Patrick Breed Sr. 70 y.o. male diagnosed with lung cancer. Currently undergoing Sutent oral chemotherapy.  Patient's last radiation treatment was 04/05/2016.   No history exists.    Review of Systems  Constitutional: Positive for malaise/fatigue and weight loss.  Respiratory: Positive for cough, sputum production and wheezing.   All other systems reviewed and are negative.   Past Medical History:  Diagnosis Date  . AAA (abdominal aortic aneurysm) ([place])   . Anxiety   . Asthma   . Cancer [facility])    Renal Cell- 2013; Lung 2017  . Chronic kidney disease    Renal Cell Adenocarcinoma  . Chronic kidney disease    Left Nephrectomy  . COPD (chronic obstructive pulmonary disease) ([place])   . Coronary artery disease   . Dehydration 12/16/2015  . Depression   . Diabetes mellitus without complication ([place])    Type II  . Eczema   . Encounter for antineoplastic chemotherapy 08/19/2015  . Encounter for antineoplastic immunotherapy 01/06/2016  . GERD (gastroesophageal reflux disease)   . Headache   . Heart murmur   . HPTH (hyperparathyroidism) ([place])   . Hx of radiation therapy 08/20/15   Right Temporal brain  . Hyperlipidemia   . Hyperparathyroidism ([place])   . Hypertension   . Hypertension 08/19/2015  . Hypothyroidism   . Malignant neoplasm of left kidney ([place]) 02/09/2013  . Melena 03/17/2016  . Neuropathy ([place])   . Shortness of breath dyspnea   . Sleep apnea    CPAP  . Thoracic aortic aneurysm ([place]) 2017    Past Surgical History:  Procedure Laterality Date  . BACK SURGERY     compressed vertebrae-put in a plate to seprate the spaces  . CARDIAC CATHETERIZATION    . CERVICAL FUSION    . CORONARY ARTERY BYPASS GRAFT     x 5  . ENDOBRONCHIAL ULTRASOUND N/A 06/30/2015   Procedure: ENDOHIAL ULTRASOUND;  Surgeon: Flora Lipps, MD;  Location: [facility] ORS;  Service: Cardiopulmonary;  Laterality: N/A;  . FRACTURE SURGERY       left arm repair  . KNEE SURGERY     left knee repair  . PARATHYROIDECTOMY  12/17/09  . VIDEO BRONCHOSCOPY WITH ENDOBRONCHIAL ULTRASOUND N/A 07/09/2015   Procedure: VIDEO BRONCHOSCOPY WITH ENDOBRONCHIAL ULTRASOUND;  Surgeon: Ivin Poot, MD;  Location: [place];  Service: Thoracic;  Laterality: N/A;    has COPD, mild ([place]); Atherosclerosis of coronary artery; Clinical depression; Diabetes mellitus, type 2 ([place]); Essential (primary) hypertension; Cardiac murmur; HLD (hyperlipidemia); Adult hypothyroidism; Adiposity; Adenocarcinoma, renal cell ([place]); Arteriosclerosis of coronary artery; CAFL (chronic airflow limitation) ([place]); Enterogastritis; Acid reflux; HPTH (hyperparathyroidism) ([place]); BP (high blood pressure); Eczema intertrigo; Obstructive apnea; Malignant neoplasm of left kidney ([place]); Chronic obstructive pulmonary disease ([place]); Personal history of other malignant neoplasm of kidney; Hyperparathyroidism ([place]); Type 2 diabetes mellitus ([place]); Brain metastasis ([place]); Encounter for antineoplastic chemotherapy; Hypertension; Dehydration; Acute renal failure (ARF) ([place]); Encounter for antineoplastic immunotherapy; Cough with hemoptysis; Melena; 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    is allergic to amoxicillin; codeine; penicillins; and ace inhibitors.  Allergies as of 04/16/2016      Reactions   Amoxicillin Anaphylaxis, Rash, Other (See Comments)   Has patient had a PCN reaction causing immediate rash, facial/tongue/throat swelling, SOB or lightheadedness with hypotension: Yes Has patient had a PCN reaction causing severe rash involving mucus  membranes or skin necrosis: No Has patient had a PCN reaction that required hospitalization No Has patient had a PCN reaction occurring within the last 10 years: No If all of the above answers are "NO", then may proceed with Cephalosporin use.   Codeine  Anaphylaxis, Diarrhea, Rash   Penicillins Anaphylaxis, Rash, Other (See Comments)   Has patient had a PCN reaction causing immediate rash, facial/tongue/throat swelling, SOB or lightheadedness with hypotension: Yes Has patient had a PCN reaction causing severe rash involving mucus membranes or skin necrosis: No Has patient had a PCN reaction that required hospitalization No Has patient had a PCN reaction occurring within the last 10 years: No If all of the above answers are "NO", then may proceed with Cephalosporin use.   Ace Inhibitors Other (See Comments), Cough   Reaction:  Wheezing       Medication List       Accurate as of 04/16/16 11:59 PM. Always use your most recent med list.          ALPRAZolam 0.5 MG tablet Commonly known as:  XANAX TAKE ONE TABLET BY MOUTH TWICE DAILY AS NEEDED   amLODipine 10 MG tablet Commonly known as:  NORVASC Take 10 mg by mouth daily.   aspirin EC 81 MG tablet Take 1 tablet (81 mg total) by mouth at bedtime.   calcium carbonate 1500 (600 Ca) MG Tabs tablet Commonly known as:  OSCAL Take 600 mg of elemental calcium by mouth 2 (two) times daily with a meal.   cholecalciferol 1000 units tablet Commonly known as:  VITAMIN D Take 1,000 Units by mouth at bedtime.   docusate sodium 100 MG capsule Commonly known as:  COLACE Take 100 mg by mouth 2 (two) times daily.   doxycycline 100 MG tablet Commonly known as:  VIBRA-TABS Take 1 tablet (100 mg total) by mouth 2 (two) times daily.   emollient cream Commonly known as:  BIAFINE Apply topically as needed.   feeding supplement (ENSURE ENLIVE) Liqd Take 237 mLs by mouth 2 (two) times daily between meals.   FLUoxetine 40 MG capsule Commonly known as:  PROZAC Take 40 mg by mouth daily.   FLUoxetine 40 MG capsule Commonly known as:  PROZAC TAKE ONE CAPSULE BY MOUTH EVERY DAY   Fluticasone-Salmeterol 100-50 MCG/DOSE Aepb Commonly known as:  ADVAIR Inhale 1 puff into the lungs 2 (two)  times daily.   INTEGRA PLUS Caps Take 1 capsule by mouth every morning.   lidocaine 2 % solution Commonly known as:  XYLOCAINE Mix 1 part 2%viscous lidocaine,1part H2O.Swish and/or swallow 14m of this mixture,31m before meals and at bedtime, up to QID   meclizine 25 MG tablet Commonly known as:  ANTIVERT Take 25 mg by mouth 3 (three) times daily as needed for dizziness.   methylPREDNISolone 4 MG Tbpk tablet Commonly known as:  MEDROL DOSEPAK Medrol dose pak: Take as directed.   mometasone 0.1 % cream Commonly known as:  ELOCON Apply 1 application topically 2 (two) times daily as needed (for itching).   Omeprazole 20 MG Tbec Take 20 mg by mouth.   PROAIR HFA 108 (90 Base) MCG/ACT inhaler Generic drug:  albuterol INHALE 1 OR 2 PUFFS AS NEEDED   prochlorperazine 10 MG tablet Commonly known as:  COMPAZINE TAKE 1 TABLET EVERY 6 HOURS AS NEEDED FOR NAUSEA AND VOMITING   sucralfate 1 g tablet Commonly known as:  CARAFATE Dissolve 1 tablet in 1050m2O and swallow up to QID,PRN sore throat.  SUNItinib 37.5 MG capsule Commonly known as:  SUTENT Take 1 capsule (37.5 mg total) by mouth daily.        PHYSICAL EXAMINATION  Oncology Vitals 04/20/2016 04/16/2016  Height 180 cm 180 cm  Weight 102.15 kg 100.699 kg  Weight (lbs) 225 lbs 3 oz 222 lbs  BMI (kg/m2) 31.41 kg/m2 30.96 kg/m2  Temp 97.6 98  Pulse 99 106  Resp 18 18  SpO2 100 100  BSA (m2) 2.26 m2 2.25 m2   BP Readings from Last 2 Encounters:  04/20/16 (!) 135/94  04/16/16 122/88    Physical Exam  Constitutional: He is oriented to person, place, and time and well-developed, well-nourished, and in no distress.  HENT:  Head: Normocephalic and atraumatic.  Mouth/Throat: Oropharynx is clear and moist.  Eyes: Conjunctivae and EOM are normal. Pupils are equal, round, and reactive to light. Right eye exhibits no discharge. Left eye exhibits no discharge. No scleral icterus.  Neck: Normal range of motion. Neck  supple. No JVD present. No tracheal deviation present. No thyromegaly present.  Cardiovascular: Normal rate, regular rhythm, normal heart sounds and intact distal pulses.   Pulmonary/Chest: Effort normal. No respiratory distress. He has wheezes. He has rales. He exhibits no tenderness.  Abdominal: Soft. Bowel sounds are normal. He exhibits no distension and no mass. There is no tenderness. There is no rebound and no guarding.  Musculoskeletal: Normal range of motion. He exhibits no edema, tenderness or deformity.  Lymphadenopathy:    He has no cervical adenopathy.  Neurological: He is alert and oriented to person, place, and time. Gait normal.  Skin: Skin is warm and dry. No rash noted. No erythema. No pallor.  Psychiatric: Affect normal.  Nursing note and vitals reviewed.   LABORATORY DATA:. Appointment on 04/16/2016  Component Date Value Ref Range Status  . WBC 04/16/2016 10.3  4.0 - 10.3 10e3/uL Final  . NEUT# 04/16/2016 8.9* 1.5 - 6.5 10e3/uL Final  . HGB 04/16/2016 13.5  13.0 - 17.1 g/dL Final  . HCT 04/16/2016 41.6  38.4 - 49.9 % Final  . Platelets 04/16/2016 137* 140 - 400 10e3/uL Final  . MCV 04/16/2016 87.4  79.3 - 98.0 fL Final  . MCH 04/16/2016 28.4  27.2 - 33.4 pg Final  . MCHC 04/16/2016 32.4  32.0 - 36.0 g/dL Final  . RBC 04/16/2016 4.76  4.20 - 5.82 10e6/uL Final  . RDW 04/16/2016 15.9* 11.0 - 14.6 % Final  . lymph# 04/16/2016 0.5* 0.9 - 3.3 10e3/uL Final  . MONO# 04/16/2016 0.6  0.1 - 0.9 10e3/uL Final  . Eosinophils Absolute 04/16/2016 0.2  0.0 - 0.5 10e3/uL Final  . Basophils Absolute 04/16/2016 0.0  0.0 - 0.1 10e3/uL Final  . NEUT% 04/16/2016 86.2* 39.0 - 75.0 % Final  . LYMPH% 04/16/2016 5.0* 14.0 - 49.0 % Final  . MONO% 04/16/2016 6.0  0.0 - 14.0 % Final  . EOS% 04/16/2016 2.4  0.0 - 7.0 % Final  . BASO% 04/16/2016 0.4  0.0 - 2.0 % Final  . Sodium 04/16/2016 134* 136 - 145 mEq/L Final  . Potassium 04/16/2016 4.9  3.5 - 5.1 mEq/L Final  . Chloride 04/16/2016  101  98 - 109 mEq/L Final  . CO2 04/16/2016 22  22 - 29 mEq/L Final  . Glucose 04/16/2016 108  70 - 140 mg/dl Final  . BUN 04/16/2016 30.7* 7.0 - 26.0 mg/dL Final  . Creatinine 04/16/2016 1.7* 0.7 - 1.3 mg/dL Final  . Total Bilirubin 04/16/2016 0.94  0.20 -  1.20 mg/dL Final  . Alkaline Phosphatase 04/16/2016 97  40 - 150 U/L Final  . AST 04/16/2016 30  5 - 34 U/L Final  . ALT 04/16/2016 18  0 - 55 U/L Final  . Total Protein 04/16/2016 7.3  6.4 - 8.3 g/dL Final  . Albumin 04/16/2016 3.5  3.5 - 5.0 g/dL Final  . Calcium 04/16/2016 9.1  8.4 - 10.4 mg/dL Final  . Anion Gap 04/16/2016 11  3 - 11 mEq/L Final  . EGFR 04/16/2016 41* >90 ml/min/1.73 m2 Final    RADIOGRAPHIC STUDIES: No results found.  ASSESSMENT/PLAN:    Unintentional weight loss Patient states that he has had minimal appetite and poor oral intake recently.  His last weight check revealed that he lost approximate 10 pounds recently.  Patient was encouraged to push fluids and protein in his diet is much as possible.  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  Patient is scheduled to return for visit only with Dr. [name] on 04/20/2016.  Bronchitis Patient states that he has had a cold and a very congested, productive cough for the last week or so.  He also, he has had some audible wheezing.  Patient denies any recent fevers or chills.  Exam today revealed some rhonchi and bilateral lungs; with extensive wheezing.  Confirmed the patient are has both an albuterol inhaler and Advair inhaler at home to take as directed.  Reviewed all findings with Dr. [name]; and he recommended that patient be prescribed doxycycline antibiotics for treatment of bronchitis.  Will also prescribe a repeat Medrol Dosepak as well.  Patient was encouraged to increase his use of the albuterol inhaler 1-2 puffs every 6 hours as  needed.  Patient also received an albuterol nebulizer treatment while at the cancer center. Patient was advised to call/return or go directly to the emergency department for any worsening symptoms whatsoever.   Patient stated understanding of all instructions; and was in agreement with this plan of care. The patient knows to call the clinic with any problems, questions or concerns.   Total time spent with patient was 25 minutes;  with greater than 75 percent of that time spent in face to face counseling regarding patient's symptoms,  and coordination of care and follow up.  Disclaimer:This dictation was prepared with Dragon/digital dictation along with [employer]. Any transcriptional errors that result from this process are unintentional.  Drue Second, NP 04/20/2016

## 2016-04-20 NOTE — Assessment & Plan Note (Signed)
Patient continues to take Sutent oral chemo;  and completed his last radiation treatment on 04/05/2016.  Patient is scheduled to return for visit only with Dr. [name] on 04/20/2016.

## 2016-04-23 ENCOUNTER — Encounter (HOSPITAL_COMMUNITY): Payer: Self-pay | Admitting: Emergency Medicine

## 2016-04-23 ENCOUNTER — Emergency Department (HOSPITAL_COMMUNITY)
Admission: EM | Admit: 2016-04-23 | Discharge: 2016-04-23 | Disposition: A | Discharge status: Home / Self Care | Payer: Medicare Other | Attending: Emergency Medicine | Admitting: Emergency Medicine

## 2016-04-23 ENCOUNTER — Telehealth: Payer: Self-pay | Admitting: Medical Oncology

## 2016-04-23 ENCOUNTER — Emergency Department (HOSPITAL_COMMUNITY): Payer: Medicare Other

## 2016-04-23 DIAGNOSIS — Z79899 Other long term (current) drug therapy: Secondary | ICD-10-CM | POA: Diagnosis not present

## 2016-04-23 DIAGNOSIS — E1140 Type 2 diabetes mellitus with diabetic neuropathy, unspecified: Secondary | ICD-10-CM | POA: Insufficient documentation

## 2016-04-23 DIAGNOSIS — E860 Dehydration: Secondary | ICD-10-CM

## 2016-04-23 DIAGNOSIS — Z951 Presence of aortocoronary bypass graft: Secondary | ICD-10-CM | POA: Insufficient documentation

## 2016-04-23 DIAGNOSIS — J449 Chronic obstructive pulmonary disease, unspecified: Secondary | ICD-10-CM | POA: Insufficient documentation

## 2016-04-23 DIAGNOSIS — I129 Hypertensive chronic kidney disease with stage 1 through stage 4 chronic kidney disease, or unspecified chronic kidney disease: Secondary | ICD-10-CM | POA: Diagnosis not present

## 2016-04-23 DIAGNOSIS — E1122 Type 2 diabetes mellitus with diabetic chronic kidney disease: Secondary | ICD-10-CM | POA: Insufficient documentation

## 2016-04-23 DIAGNOSIS — Z7982 Long term (current) use of aspirin: Secondary | ICD-10-CM | POA: Insufficient documentation

## 2016-04-23 DIAGNOSIS — Z85528 Personal history of other malignant neoplasm of kidney: Secondary | ICD-10-CM | POA: Diagnosis not present

## 2016-04-23 DIAGNOSIS — N189 Chronic kidney disease, unspecified: Secondary | ICD-10-CM | POA: Insufficient documentation

## 2016-04-23 DIAGNOSIS — I2510 Atherosclerotic heart disease of native coronary artery without angina pectoris: Secondary | ICD-10-CM | POA: Diagnosis not present

## 2016-04-23 DIAGNOSIS — Z87891 Personal history of nicotine dependence: Secondary | ICD-10-CM | POA: Insufficient documentation

## 2016-04-23 DIAGNOSIS — E039 Hypothyroidism, unspecified: Secondary | ICD-10-CM | POA: Insufficient documentation

## 2016-04-23 DIAGNOSIS — R918 Other nonspecific abnormal finding of lung field: Secondary | ICD-10-CM | POA: Insufficient documentation

## 2016-04-23 DIAGNOSIS — R531 Weakness: Secondary | ICD-10-CM | POA: Diagnosis present

## 2016-04-23 LAB — CBC WITH DIFFERENTIAL/PLATELET
Basophils Absolute: 0 10*3/uL (ref 0.0–0.1)
Basophils Relative: 0 %
Eosinophils Absolute: 0 10*3/uL (ref 0.0–0.7)
Eosinophils Relative: 1 %
HCT: 35.9 % — ABNORMAL LOW (ref 39.0–52.0)
Hemoglobin: 12.3 g/dL — ABNORMAL LOW (ref 13.0–17.0)
Lymphocytes Relative: 9 %
Lymphs Abs: 0.5 10*3/uL — ABNORMAL LOW (ref 0.7–4.0)
MCH: 28.7 pg (ref 26.0–34.0)
MCHC: 34.3 g/dL (ref 30.0–36.0)
MCV: 83.9 fL (ref 78.0–100.0)
Monocytes Absolute: 0.2 10*3/uL (ref 0.1–1.0)
Monocytes Relative: 3 %
Neutro Abs: 5.2 10*3/uL (ref 1.7–7.7)
Neutrophils Relative %: 88 %
Platelets: 93 10*3/uL — ABNORMAL LOW (ref 150–400)
RBC: 4.28 MIL/uL (ref 4.22–5.81)
RDW: 15.1 % (ref 11.5–15.5)
WBC: 5.9 10*3/uL (ref 4.0–10.5)

## 2016-04-23 LAB — BASIC METABOLIC PANEL
Anion gap: 11 (ref 5–15)
BUN: 37 mg/dL — ABNORMAL HIGH (ref 6–20)
CO2: 21 mmol/L — ABNORMAL LOW (ref 22–32)
Calcium: 8.6 mg/dL — ABNORMAL LOW (ref 8.9–10.3)
Chloride: 101 mmol/L (ref 101–111)
Creatinine, Ser: 1.41 mg/dL — ABNORMAL HIGH (ref 0.61–1.24)
GFR calc Af Amer: 57 mL/min — ABNORMAL LOW (ref >60)
GFR calc non Af Amer: 49 mL/min — ABNORMAL LOW (ref >60)
Glucose, Bld: 109 mg/dL — ABNORMAL HIGH (ref 65–99)
Potassium: 4.4 mmol/L (ref 3.5–5.1)
Sodium: 133 mmol/L — ABNORMAL LOW (ref 135–145)

## 2016-04-23 MED ORDER — PROMETHAZINE-DM 6.25-15 MG/5ML PO SYRP: 5.0000 mL | ORAL_SOLUTION | Freq: Four times a day (QID) | ORAL | 0 refills | Status: DC | PRN

## 2016-04-23 MED ORDER — SODIUM CHLORIDE 0.9 % IV BOLUS (SEPSIS)
500.0000 mL | Freq: Once | INTRAVENOUS | Status: AC
  Administered 2016-04-23: 500 mL via INTRAVENOUS

## 2016-04-23 MED ORDER — IOPAMIDOL (ISOVUE-370) INJECTION 76%
100.0000 mL | Freq: Once | INTRAVENOUS | Status: AC | PRN
  Administered 2016-04-23: 100 mL via INTRAVENOUS

## 2016-04-23 MED ORDER — ONDANSETRON HCL 4 MG/2ML IJ SOLN
4.0000 mg | Freq: Once | INTRAMUSCULAR | Status: AC
  Administered 2016-04-23: 4 mg via INTRAVENOUS
  Filled 2016-04-23: qty 2

## 2016-04-23 MED ORDER — IOPAMIDOL (ISOVUE-370) INJECTION 76%
INTRAVENOUS | Status: DC
Start: 2016-04-23 — End: 2016-04-24
  Filled 2016-04-23: qty 100

## 2016-04-23 MED ORDER — SODIUM CHLORIDE 0.9 % IV BOLUS (SEPSIS)
1000.0000 mL | Freq: Once | INTRAVENOUS | Status: AC
  Administered 2016-04-23: 1000 mL via INTRAVENOUS

## 2016-04-23 NOTE — ED Triage Notes (Signed)
Pt here c/o shortness of breath, cough and abdominal pain. Pt states he saw his oncologist and was placed on antibiotics and steroids with no relief, saw another doctor and was extended 5 more days of both. Pt finished on Wednesday but is not having any relief. Pt has lung cancer. Reports not eating much but has been drinking plenty of water.

## 2016-04-23 NOTE — Discharge Instructions (Signed)
Return here as needed.  Follow-up with your oncologist

## 2016-04-23 NOTE — ED Provider Notes (Signed)
Complains of cough since November 2017. Nonproductive. He presents today as he's had generalized weakness over the past 2 days accompanied by diarrhea 3 or 4 episodes per day for the past 3 days he also reports diminished appetite for several weeks because "I can't taste food" states he hasn't eaten in the past 3 days though he is drinking some. He does admit to a few episodes of posttussive vomiting. Patient also reports slight crampy mid abdominal pain though none presently. He had chills last night but did not take his temperature. No treatment with antipyretics. He treated himself with spurring and albuterol which helped his breathing. On exam he is in no distress lungs clear auscultation heart regular rate and rhythm mildly tachycardic abdomen nondistended normal active bowel sounds soft nontender. Extremities without edema   Orlie Dakin, MD 04/23/16 1601

## 2016-04-23 NOTE — Telephone Encounter (Signed)
Not feeling good at all , he "cannot stop coughing , my legs , ankles and all my  joints hurt. I am so weak I can"t eat". He completed his steroids on wed and has a few more antibiotics. . He said he felt a little better after he started them , but not anymore.  I instructed pt to contact PCP or go to ED.

## 2016-04-27 ENCOUNTER — Ambulatory Visit (HOSPITAL_BASED_OUTPATIENT_CLINIC_OR_DEPARTMENT_OTHER): Payer: Medicare Other

## 2016-04-27 ENCOUNTER — Other Ambulatory Visit (HOSPITAL_COMMUNITY)
Admission: RE | Admit: 2016-04-27 | Discharge: 2016-04-27 | Disposition: A | Discharge status: Home / Self Care | Payer: Medicare Other | Source: Ambulatory Visit

## 2016-04-27 ENCOUNTER — Other Ambulatory Visit: Payer: Self-pay | Admitting: Nurse Practitioner

## 2016-04-27 ENCOUNTER — Ambulatory Visit (HOSPITAL_BASED_OUTPATIENT_CLINIC_OR_DEPARTMENT_OTHER): Payer: Medicare Other | Admitting: Nurse Practitioner

## 2016-04-27 ENCOUNTER — Ambulatory Visit (HOSPITAL_COMMUNITY)
Admission: RE | Admit: 2016-04-27 | Discharge: 2016-04-27 | Disposition: A | Discharge status: Home / Self Care | Payer: Medicare Other | Source: Ambulatory Visit | Attending: Nurse Practitioner | Admitting: Nurse Practitioner

## 2016-04-27 ENCOUNTER — Telehealth: Payer: Self-pay | Admitting: Medical Oncology

## 2016-04-27 ENCOUNTER — Encounter: Payer: Self-pay | Admitting: Nurse Practitioner

## 2016-04-27 VITALS — BP 114/59 | HR 91 | Temp 97.5°F | Resp 18 | Wt 225.9 lb

## 2016-04-27 DIAGNOSIS — D6959 Other secondary thrombocytopenia: Secondary | ICD-10-CM

## 2016-04-27 DIAGNOSIS — E860 Dehydration: Secondary | ICD-10-CM

## 2016-04-27 DIAGNOSIS — R5383 Other fatigue: Secondary | ICD-10-CM

## 2016-04-27 DIAGNOSIS — R509 Fever, unspecified: Secondary | ICD-10-CM | POA: Diagnosis not present

## 2016-04-27 DIAGNOSIS — E46 Unspecified protein-calorie malnutrition: Secondary | ICD-10-CM

## 2016-04-27 DIAGNOSIS — C7931 Secondary malignant neoplasm of brain: Secondary | ICD-10-CM

## 2016-04-27 DIAGNOSIS — R197 Diarrhea, unspecified: Secondary | ICD-10-CM

## 2016-04-27 DIAGNOSIS — C3490 Malignant neoplasm of unspecified part of unspecified bronchus or lung: Secondary | ICD-10-CM | POA: Diagnosis present

## 2016-04-27 DIAGNOSIS — R531 Weakness: Secondary | ICD-10-CM

## 2016-04-27 DIAGNOSIS — C642 Malignant neoplasm of left kidney, except renal pelvis: Secondary | ICD-10-CM

## 2016-04-27 DIAGNOSIS — C7801 Secondary malignant neoplasm of right lung: Secondary | ICD-10-CM

## 2016-04-27 DIAGNOSIS — R410 Disorientation, unspecified: Secondary | ICD-10-CM | POA: Insufficient documentation

## 2016-04-27 DIAGNOSIS — C3432 Malignant neoplasm of lower lobe, left bronchus or lung: Secondary | ICD-10-CM

## 2016-04-27 DIAGNOSIS — G930 Cerebral cysts: Secondary | ICD-10-CM | POA: Insufficient documentation

## 2016-04-27 DIAGNOSIS — R05 Cough: Secondary | ICD-10-CM | POA: Diagnosis not present

## 2016-04-27 DIAGNOSIS — E8809 Other disorders of plasma-protein metabolism, not elsewhere classified: Secondary | ICD-10-CM

## 2016-04-27 DIAGNOSIS — T451X5A Adverse effect of antineoplastic and immunosuppressive drugs, initial encounter: Secondary | ICD-10-CM

## 2016-04-27 DIAGNOSIS — C7802 Secondary malignant neoplasm of left lung: Secondary | ICD-10-CM | POA: Diagnosis not present

## 2016-04-27 LAB — CBC WITH DIFFERENTIAL/PLATELET
BASO%: 0.3 % (ref 0.0–2.0)
Basophils Absolute: 0 10*3/uL (ref 0.0–0.1)
EOS ABS: 0.1 10*3/uL (ref 0.0–0.5)
EOS%: 1.9 % (ref 0.0–7.0)
HCT: 32.5 % — ABNORMAL LOW (ref 38.4–49.9)
HEMOGLOBIN: 11.2 g/dL — AB (ref 13.0–17.1)
LYMPH%: 11.2 % — AB (ref 14.0–49.0)
MCH: 28.9 pg (ref 27.2–33.4)
MCHC: 34.5 g/dL (ref 32.0–36.0)
MCV: 83.8 fL (ref 79.3–98.0)
MONO#: 0.1 10*3/uL (ref 0.1–0.9)
MONO%: 1.9 % (ref 0.0–14.0)
NEUT#: 2.6 10*3/uL (ref 1.5–6.5)
NEUT%: 84.7 % — ABNORMAL HIGH (ref 39.0–75.0)
PLATELETS: 58 10*3/uL — AB (ref 140–400)
RBC: 3.88 10*6/uL — AB (ref 4.20–5.82)
RDW: 15.6 % — AB (ref 11.0–14.6)
WBC: 3.1 10*3/uL — AB (ref 4.0–10.3)
lymph#: 0.4 10*3/uL — ABNORMAL LOW (ref 0.9–3.3)

## 2016-04-27 LAB — COMPREHENSIVE METABOLIC PANEL
ALBUMIN: 2.2 g/dL — AB (ref 3.5–5.0)
ALK PHOS: 246 U/L — AB (ref 40–150)
ALT: 42 U/L (ref 0–55)
ANION GAP: 9 meq/L (ref 3–11)
AST: 63 U/L — ABNORMAL HIGH (ref 5–34)
BILIRUBIN TOTAL: 0.5 mg/dL (ref 0.20–1.20)
BUN: 34.2 mg/dL — ABNORMAL HIGH (ref 7.0–26.0)
CO2: 19 meq/L — AB (ref 22–29)
CREATININE: 1.7 mg/dL — AB (ref 0.7–1.3)
Calcium: 8.1 mg/dL — ABNORMAL LOW (ref 8.4–10.4)
Chloride: 104 mEq/L (ref 98–109)
EGFR: 40 mL/min/{1.73_m2} — ABNORMAL LOW (ref >90)
GLUCOSE: 106 mg/dL (ref 70–140)
Potassium: 4 mEq/L (ref 3.5–5.1)
SODIUM: 133 meq/L — AB (ref 136–145)
TOTAL PROTEIN: 5.1 g/dL — AB (ref 6.4–8.3)

## 2016-04-27 LAB — AMMONIA: Ammonia: 15 umol/L (ref 9–35)

## 2016-04-27 MED ORDER — METHYLPREDNISOLONE 4 MG PO TBPK: ORAL_TABLET | ORAL | 0 refills | Status: DC

## 2016-04-27 MED ORDER — SODIUM CHLORIDE 0.9 % IV SOLN
Freq: Once | INTRAVENOUS | Status: AC
  Administered 2016-04-27: 12:00:00 via INTRAVENOUS

## 2016-04-27 MED ORDER — GADOBENATE DIMEGLUMINE 529 MG/ML IV SOLN
20.0000 mL | Freq: Once | INTRAVENOUS | Status: AC | PRN
  Administered 2016-04-27: 20 mL via INTRAVENOUS

## 2016-04-27 NOTE — Assessment & Plan Note (Signed)
Patient continues to take Sutent oral therapy as directed.  He completed radiation treatments several weeks ago.  Patient has also received a Medrol Dosepak in the past; as well as a longer prednisone taper-which only somewhat improved.  Patient's coughing symptoms.  Patient states that he presented to the emergency department last week with complaint of diarrhea and dehydration.  He was given IV fluid rehydration at that time; and also underwent a restaging CT angiogram of the chest at that time.  CT revealed continued progression of disease.  Patient presented back to the [facility] today with complaint of continued, persistent cough, low-grade fever to a maximum of 99.6 over the weekend, and increased fatigue and weakness.  Patient continues taking doxycycline antibiotics as directed.  Family members state that patient has spent the last weekend in the bed; and requires a good deal of assistance for any ambulation at this point.  He continues to try eating and drinking; but very little.  Patient's family also state that patient has been increasingly sleepy as well.  Of note-patient was given Phenergan cough syrup for his cough while in the emergency department; since patient is allergic to all codeine products.  Exam today revealed.  Patient with a dry, nonproductive cough.  Breath sounds are essentially clear with no wheezing.  Patient was essentially alert and oriented and followed all commands; but would occasionally fall asleep when he became quiet.  He does appear fatigued and weak; and did require 2 person assist for any ambulation to go to the bathroom.  Vital signs were stable while at the cancer Center.  Patient was afebrile.  Brain MRI obtained today revealed stable disease.  Dr. [name] in to review all with both patient and his wife; and recommended that patient consider either hospice or home health.  Patient stated that he was not quite ready to consider hospice.  At this point;  but would welcome the assistance of home health.  This provider will arrange for home health and physical therapy in the home.  Patient will continue with his oral Sutent for now.  He is scheduled to return on 05/12/2016 for labs and a follow-up visit.  Of note-we'll cancel the brain MRI that was scheduled for 05/07/2016.

## 2016-04-27 NOTE — Progress Notes (Signed)
SYMPTOM MANAGEMENT CLINIC    Chief Complaint: Cough, dehydration, weakness  HPI:  Patrick Breed Sr. 71 y.o. male diagnosed with renal cell cancer; with both lung and brain metastasis.  Currently undergoing Sutent oral therapy.    No history exists.    Review of Systems  Constitutional: Positive for fever, malaise/fatigue and weight loss.  Respiratory: Positive for cough.   Gastrointestinal: Positive for diarrhea.  Neurological: Positive for weakness.  All other systems reviewed and are negative.   Past Medical History:  Diagnosis Date  . AAA (abdominal aortic aneurysm) ([place])   . Anxiety   . Asthma   . Cancer [facility])    Renal Cell- 2013; Lung 2017  . Chronic kidney disease    Renal Cell Adenocarcinoma  . Chronic kidney disease    Left Nephrectomy  . COPD (chronic obstructive pulmonary disease) ([place])   . Coronary artery disease   . Dehydration 12/16/2015  . Depression   . Diabetes mellitus without complication ([place])    Type II  . Eczema   . Encounter for antineoplastic chemotherapy 08/19/2015  . Encounter for antineoplastic immunotherapy 01/06/2016  . GERD (gastroesophageal reflux disease)   . Headache   . Heart murmur   . HPTH (hyperparathyroidism) ([place])   . Hx of radiation therapy 08/20/15   Right Temporal brain  . Hyperlipidemia   . Hyperparathyroidism ([place])   . Hypertension   . Hypertension 08/19/2015  . Hypothyroidism   . Malignant neoplasm of left kidney ([place]) 02/09/2013  . Melena 03/17/2016  . Neuropathy ([place])   . Shortness of breath dyspnea   . Sleep apnea    CPAP  . Thoracic aortic aneurysm ([place]) 2017    Past Surgical History:  Procedure Laterality Date  . BACK SURGERY     compressed vertebrae-put in a plate to seprate the spaces  . CARDIAC CATHETERIZATION    . CERVICAL FUSION    . CORONARY ARTERY BYPASS GRAFT     x 5  . ENDOBRONCHIAL ULTRASOUND N/A 06/30/2015   Procedure: ENDOHIAL ULTRASOUND;  Surgeon: Flora Lipps, MD;  Location: [facility] ORS;   Service: Cardiopulmonary;  Laterality: N/A;  . FRACTURE SURGERY     left arm repair  . KNEE SURGERY     left knee repair  . PARATHYROIDECTOMY  12/17/09  . VIDEO BRONCHOSCOPY WITH ENDOBRONCHIAL ULTRASOUND N/A 07/09/2015   Procedure: VIDEO BRONCHOSCOPY WITH ENDOBRONCHIAL ULTRASOUND;  Surgeon: Ivin Poot, MD;  Location: [place];  Service: Thoracic;  Laterality: N/A;    has COPD, mild ([place]); Atherosclerosis of coronary artery; Clinical depression; Diabetes mellitus, type 2 ([place]); Essential (primary) hypertension; Cardiac murmur; HLD (hyperlipidemia); Adult hypothyroidism; Adiposity; Adenocarcinoma, renal cell ([place]); Arteriosclerosis of coronary artery; CAFL (chronic airflow limitation) ([place]); Enterogastritis; Acid reflux; HPTH (hyperparathyroidism) ([place]); BP (high blood pressure); Eczema intertrigo; Obstructive apnea; Malignant neoplasm of left kidney ([place]); Chronic obstructive pulmonary disease ([place]); Personal history of other malignant neoplasm of kidney; Hyperparathyroidism ([place]); Type 2 diabetes mellitus ([place]); Brain metastasis ([place]); Encounter for antineoplastic chemotherapy; Hypertension; Dehydration; Diarrhea; Acute renal failure (ARF) ([place]); Encounter for antineoplastic immunotherapy; Cough with hemoptysis; Melena; 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    is allergic to amoxicillin; codeine; penicillins; and ace inhibitors.  Allergies as of 04/27/2016      Reactions   Amoxicillin Anaphylaxis, Rash, Other (See Comments)   Has patient had a PCN reaction causing immediate rash,  facial/tongue/throat swelling, SOB or lightheadedness with hypotension: Yes Has patient had a PCN reaction causing severe rash involving mucus membranes or skin necrosis: No Has patient had a PCN reaction that required hospitalization  No Has patient had a PCN reaction occurring within the last 10 years: No If all of the above answers are "NO", then may proceed with Cephalosporin use.   Codeine Anaphylaxis, Diarrhea, Rash   Penicillins Anaphylaxis, Rash, Other (See Comments)   Has patient had a PCN reaction causing immediate rash, facial/tongue/throat swelling, SOB or lightheadedness with hypotension: Yes Has patient had a PCN reaction causing severe rash involving mucus membranes or skin necrosis: No Has patient had a PCN reaction that required hospitalization No Has patient had a PCN reaction occurring within the last 10 years: No If all of the above answers are "NO", then may proceed with Cephalosporin use.   Ace Inhibitors Other (See Comments), Cough   Reaction:  Wheezing       Medication List       Accurate as of 04/27/16 11:19 PM. Always use your most recent med list.          ALPRAZolam 0.5 MG tablet Commonly known as:  XANAX TAKE ONE TABLET BY MOUTH TWICE DAILY AS NEEDED   amLODipine 10 MG tablet Commonly known as:  NORVASC Take 10 mg by mouth daily.   aspirin EC 81 MG tablet Take 1 tablet (81 mg total) by mouth at bedtime.   calcium carbonate 1500 (600 Ca) MG Tabs tablet Commonly known as:  OSCAL Take 600 mg of elemental calcium by mouth 2 (two) times daily with a meal.   cholecalciferol 1000 units tablet Commonly known as:  VITAMIN D Take 1,000 Units by mouth at bedtime.   doxycycline 100 MG tablet Commonly known as:  VIBRA-TABS Take 1 tablet (100 mg total) by mouth 2 (two) times daily.   emollient cream Commonly known as:  BIAFINE Apply topically as needed.   feeding supplement (ENSURE ENLIVE) Liqd Take 237 mLs by mouth 2 (two) times daily between meals.   FLUoxetine 40 MG capsule Commonly known as:  PROZAC Take 40 mg by mouth daily.   FLUoxetine 40 MG capsule Commonly known as:  PROZAC TAKE ONE CAPSULE BY MOUTH EVERY DAY   Fluticasone-Salmeterol 100-50 MCG/DOSE Aepb Commonly  known as:  ADVAIR Inhale 1 puff into the lungs 2 (two) times daily.   INTEGRA PLUS Caps Take 1 capsule by mouth every morning.   lidocaine 2 % solution Commonly known as:  XYLOCAINE Mix 1 part 2%viscous lidocaine,1part H2O.Swish and/or swallow 50m of this mixture,339m before meals and at bedtime, up to QID   meclizine 25 MG tablet Commonly known as:  ANTIVERT Take 25 mg by mouth 3 (three) times daily as needed for dizziness.   methylPREDNISolone 4 MG Tbpk tablet Commonly known as:  MEDROL DOSEPAK Medrol dose pak: Take as directed.   mometasone 0.1 % cream Commonly known as:  ELOCON Apply 1 application topically 2 (two) times daily as needed (for itching).   PROAIR HFA 108 (90 Base) MCG/ACT inhaler Generic drug:  albuterol INHALE 1 OR 2 PUFFS AS NEEDED   prochlorperazine 10 MG tablet Commonly known as:  COMPAZINE TAKE 1 TABLET EVERY 6 HOURS AS NEEDED FOR NAUSEA AND VOMITING   promethazine-dextromethorphan 6.25-15 MG/5ML syrup Commonly known as:  PROMETHAZINE-DM Take 5 mLs by mouth 4 (four) times daily as needed for cough.   sucralfate 1 g tablet Commonly known as:  CARAFATE Dissolve 1 tablet in  17m H2O and swallow up to QID,PRN sore throat.   SUNItinib 37.5 MG capsule Commonly known as:  SUTENT Take 1 capsule (37.5 mg total) by mouth daily.        PHYSICAL EXAMINATION  Oncology Vitals 04/27/2016 04/23/2016  Height - -  Weight 102.468 kg -  Weight (lbs) 225 lbs 14 oz -  BMI (kg/m2) 31.51 kg/m2 -  Temp 97.5 -  Pulse 91 87  Resp 18 17  SpO2 97 96  BSA (m2) 2.27 m2 -   BP Readings from Last 2 Encounters:  04/27/16 (!) 114/59  04/23/16 118/81    Physical Exam  Constitutional: He is oriented to person, place, and time. He appears dehydrated. He appears unhealthy.  HENT:  Head: Normocephalic and atraumatic.  Mouth/Throat: Oropharynx is clear and moist.  Eyes: Conjunctivae and EOM are normal. Pupils are equal, round, and reactive to light. Right eye  exhibits no discharge. Left eye exhibits no discharge. No scleral icterus.  Neck: Normal range of motion. Neck supple. No JVD present. No tracheal deviation present. No thyromegaly present.  Cardiovascular: Normal rate, regular rhythm, normal heart sounds and intact distal pulses.   Pulmonary/Chest: Effort normal and breath sounds normal. No respiratory distress. He has no wheezes. He has no rales. He exhibits no tenderness.  Patient has a very frequent, dry, nonproductive cough.  Lungs clear bilaterally with no wheezes.  Abdominal: Soft. Bowel sounds are normal. He exhibits no distension and no mass. There is no tenderness. There is no rebound and no guarding.  Musculoskeletal: Normal range of motion. He exhibits no edema, tenderness or deformity.  Lymphadenopathy:    He has no cervical adenopathy.  Neurological: He is alert and oriented to person, place, and time. Gait normal.  Skin: Skin is warm and dry. No rash noted. No erythema. There is pallor.  Psychiatric: Affect normal.  Nursing note and vitals reviewed.   LABORATORY DATA:. Hospital Outpatient Visit on 04/27/2016  Component Date Value Ref Range Status  . Ammonia 04/27/2016 15  9 - 35 umol/L Final  Appointment on 04/27/2016  Component Date Value Ref Range Status  . WBC 04/27/2016 3.1* 4.0 - 10.3 10e3/uL Final  . NEUT# 04/27/2016 2.6  1.5 - 6.5 10e3/uL Final  . HGB 04/27/2016 11.2* 13.0 - 17.1 g/dL Final  . HCT 04/27/2016 32.5* 38.4 - 49.9 % Final  . Platelets 04/27/2016 58* 140 - 400 10e3/uL Final  . MCV 04/27/2016 83.8  79.3 - 98.0 fL Final  . MCH 04/27/2016 28.9  27.2 - 33.4 pg Final  . MCHC 04/27/2016 34.5  32.0 - 36.0 g/dL Final  . RBC 04/27/2016 3.88* 4.20 - 5.82 10e6/uL Final  . RDW 04/27/2016 15.6* 11.0 - 14.6 % Final  . lymph# 04/27/2016 0.4* 0.9 - 3.3 10e3/uL Final  . MONO# 04/27/2016 0.1  0.1 - 0.9 10e3/uL Final  . Eosinophils Absolute 04/27/2016 0.1  0.0 - 0.5 10e3/uL Final  . Basophils Absolute 04/27/2016 0.0   0.0 - 0.1 10e3/uL Final  . NEUT% 04/27/2016 84.7* 39.0 - 75.0 % Final  . LYMPH% 04/27/2016 11.2* 14.0 - 49.0 % Final  . MONO% 04/27/2016 1.9  0.0 - 14.0 % Final  . EOS% 04/27/2016 1.9  0.0 - 7.0 % Final  . BASO% 04/27/2016 0.3  0.0 - 2.0 % Final  . Sodium 04/27/2016 133* 136 - 145 mEq/L Final  . Potassium 04/27/2016 4.0  3.5 - 5.1 mEq/L Final  . Chloride 04/27/2016 104  98 - 109 mEq/L Final  .  CO2 04/27/2016 19* 22 - 29 mEq/L Final  . Glucose 04/27/2016 106  70 - 140 mg/dl Final  . BUN 04/27/2016 34.2* 7.0 - 26.0 mg/dL Final  . Creatinine 04/27/2016 1.7* 0.7 - 1.3 mg/dL Final  . Total Bilirubin 04/27/2016 0.50  0.20 - 1.20 mg/dL Final  . Alkaline Phosphatase 04/27/2016 246* 40 - 150 U/L Final  . AST 04/27/2016 63* 5 - 34 U/L Final  . ALT 04/27/2016 42  0 - 55 U/L Final  . Total Protein 04/27/2016 5.1* 6.4 - 8.3 g/dL Final  . Albumin 04/27/2016 2.2* 3.5 - 5.0 g/dL Final  . Calcium 04/27/2016 8.1* 8.4 - 10.4 mg/dL Final  . Anion Gap 04/27/2016 9  3 - 11 mEq/L Final  . EGFR 04/27/2016 40* >90 ml/min/1.73 m2 Final    RADIOGRAPHIC STUDIES: Mr Jeri Cos Wo Contrast  Result Date: 04/27/2016 CLINICAL DATA:  Brain metastasis. Increased confusion. Renal cell cancer, lung cancer EXAM: MRI HEAD WITHOUT AND WITH CONTRAST TECHNIQUE: Multiplanar, multiecho pulse sequences of the brain and surrounding structures were obtained without and with intravenous contrast. CONTRAST:  26m MULTIHANCE GADOBENATE DIMEGLUMINE 529 MG/ML IV SOLN COMPARISON:  MRI head 02/05/2016, 12/08/2015, 11/03/2015 FINDINGS: Brain: No new metastatic deposits identified. The small enhancing lesion in the right temporal horn region near the choroid has improved and is now normal. This may have been a metastasis of the ventricular ependyma or the choroid plexus based on prior studies when it was significantly larger. Small lesion in the right mesial temporal lobe has resolved. Negative for acute infarct. Previously noted small focus of  restricted diffusion in the left frontal operculum has resolved and may have been an area of subacute infarction on the prior study. There is moderate chronic microvascular ischemic change throughout the white matter. 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 No acute infarct on today's study. Negative for hemorrhage. No edema. 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 Vascular: Normal arterial flow voids. Skull and upper cervical spine: No worrisome bone lesions. Sinuses/Orbits: Mild mucosal edema in the paranasal sinuses. Other: None IMPRESSION: No acute intracranial abnormality. Resolving enhancing lesion in the region of the choroid plexus in the right temporal horn which is smaller today likely due to treatment. Small right medial temporal lobe lesion no longer visualized. No new enhancing lesions identified. Electronically Signed   By: CFranchot GalloM.D.   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  He completed radiation treatments several weeks ago.  Patient has also received a Medrol Dosepak in the past; as well as a longer prednisone taper-which only somewhat improved.  Patient's coughing symptoms.  Patient states that he presented to the emergency department last week with complaint of diarrhea and dehydration.  He was given IV fluid rehydration at that time; and also underwent a restaging CT angiogram of the chest at that time.  CT revealed continued progression of disease.  [facility] persistent [facility], low-grade fever to a maximum of 99.6 over the weekend, and increased fatigue and weakness.  Patient continues taking doxycycline antibiotics as directed.  Family members state that patient has spent the last weekend in the bed; and requires a good deal of assistance for any ambulation  at this point.  He continues to try eating and drinking; but very little.  Patient's family also state  that patient has been increasingly sleepy as well.  Of note-patient was given Phenergan cough syrup for his cough while in the emergency department; since patient is allergic to all codeine products.  Exam today revealed.  Patient with a dry, nonproductive cough.  Breath sounds are essentially clear with no wheezing.  Patient was essentially alert and oriented and followed all commands; but would occasionally fall asleep when he became quiet.  He does appear fatigued and weak; and did require 2 person assist for any ambulation to go to the bathroom.  Vital signs were stable while at the cancer Center.  Patient was afebrile.  Brain MRI obtained today revealed stable disease.  Dr. [name] in to review all with both patient and his wife; and recommended that patient consider either hospice or home health.  Patient stated that he was not quite ready to consider hospice.  At this point; but would welcome the assistance of home health.  This provider will arrange for home health and physical therapy in the home.  Patient will continue with his oral Sutent for now.  He is scheduled to return on 05/12/2016 for labs and a follow-up visit.  Of note-we'll cancel the brain MRI that was scheduled for 05/07/2016.  Hypoalbuminemia due to protein-calorie malnutrition (H[facility]) Albumin has decreased from 3.3 down to 2.2.  Patient was encouraged to push protein in his diet is much as possible.  Diarrhea Patient had a few episodes of diarrhea over the weekend; and feels somewhat dehydrated.  He presented to the emergency department this past Friday for complaint; received IV fluid rehydration.  He states he's had no longer has any issues with diarrhea.  Dehydration Patient had some issues with diarrhea over this past weekend; and felt dehydrated.  Sodium was decreased to 133 today; creatinine was increased to  1.7.  Patient received 1 L normal saline IV fluid rehydration.  Patient was also encouraged push fluids is much as possible.  Chemotherapy-induced thrombocytopenia Platelet count is down to 58.  Patient denies any new issues with either is a bleeding or bruising.  Will continue to monitor closely.   Patient stated understanding of all instructions; and was in agreement with this plan of care. The patient knows to call the clinic with any problems, questions or concerns.   Total time spent with patient was 40 minutes;  with greater than 75 percent of that time spent in face to face counseling regarding patient's symptoms,  and coordination of care and follow up.  Disclaimer:This dictation was prepared with Dragon/digital dictation along with [employer]. Any transcriptional errors that result from this process are unintentional.  Drue Second, NP 04/27/2016   ADDENDUM: Hematology/Oncology Attending: I had a face to face encounter with the patient. I recommended his care plan. This is a very pleasant 71 years old white male with metastatic renal cell carcinoma status post treatment with Votrient discontinued secondary to intolerance. The patient was started on treatment with immunotherapy with Nivolumab but unfortunately has evidence for disease progression. He also underwent palliative radiotherapy to the enlarging left lower lobe lung mass in addition to stereotactic radiotherapy to brain metastasis. He is currently on treatment with Sutent status post 3 weeks of treatment. Unfortunately his condition continues to get worse and he was started recently on cough syrup containing Phenergan. He became more drowsy and confused. His cough is slightly better. He presented today for evaluation of persistent fatigue and weakness, diarrhea, cough as well as fever. He was also so confused today.  We ordered MRI of the brain that showed no evidence for disease progression in the brain. I had a  lengthy discussion with the patient and his wife today about his current condition and treatment options. I explained to the patient that his condition is terminal and on the treatment of palliative nature. I strongly recommended for him to consider palliative care and hospice referral but also was given the option of advanced home care if he decided to continue with the current treatment with Sutent. Initially the patient was interested in continuing treatment but today he called back and requesting hospice referral. For dehydration he received IV hydration. He will continue with Delsym for cough as the patient has intolerance to codeine-containing cough syrup. We will see the patient on as-needed basis at this point. He was advised to call immediately if he has any concerning symptoms in the interval.  Disclaimer: This note was dictated with voice recognition software. Similar sounding words can inadvertently be transcribed and may be missed upon review. Eilleen Kempf., MD 04/28/16

## 2016-04-27 NOTE — Assessment & Plan Note (Signed)
Platelet count is down to 58.  Patient denies any new issues with either is a bleeding or bruising.  Will continue to monitor closely.

## 2016-04-27 NOTE — Patient Instructions (Signed)
Dehydration, Adult Dehydration is a condition in which there is not enough fluid or water in the body. This happens when you lose more fluids than you take in. Important organs, such as the kidneys, brain, and heart, cannot function without a proper amount of fluids. Any loss of fluids from the body can lead to dehydration. Dehydration can range from mild to severe. This condition should be treated right away to prevent it from becoming severe. What are the causes? This condition may be caused by:  Vomiting.  Diarrhea.  Excessive sweating, such as from heat exposure or exercise.  Not drinking enough fluid, especially:  When ill.  While doing activity that requires a lot of energy.  Excessive urination.  Fever.  Infection.  Certain medicines, such as medicines that cause the body to lose excess fluid (diuretics).  Inability to access safe drinking water.  Reduced physical ability to get adequate water and food. What increases the risk? This condition is more likely to develop in people:  Who have a poorly controlled long-term (chronic) illness, such as diabetes, heart disease, or kidney disease.  Who are age 65 or older.  Who are disabled.  Who live in a place with high altitude.  Who play endurance sports. What are the signs or symptoms? Symptoms of mild dehydration may include:   Thirst.  Dry lips.  Slightly dry mouth.  Dry, warm skin.  Dizziness. Symptoms of moderate dehydration may include:   Very dry mouth.  Muscle cramps.  Dark urine. Urine may be the color of tea.  Decreased urine production.  Decreased tear production.  Heartbeat that is irregular or faster than normal (palpitations).  Headache.  Light-headedness, especially when you stand up from a sitting position.  Fainting (syncope). Symptoms of severe dehydration may include:   Changes in skin, such as:  Cold and clammy skin.  Blotchy (mottled) or pale skin.  Skin that does  not quickly return to normal after being lightly pinched and released (poor skin turgor).  Changes in body fluids, such as:  Extreme thirst.  No tear production.  Inability to sweat when body temperature is high, such as in hot weather.  Very little urine production.  Changes in vital signs, such as:  Weak pulse.  Pulse that is more than 100 beats a minute when sitting still.  Rapid breathing.  Low blood pressure.  Other changes, such as:  Sunken eyes.  Cold hands and feet.  Confusion.  Lack of energy (lethargy).  Difficulty waking up from sleep.  Short-term weight loss.  Unconsciousness. How is this diagnosed? This condition is diagnosed based on your symptoms and a physical exam. Blood and urine tests may be done to help confirm the diagnosis. How is this treated? Treatment for this condition depends on the severity. Mild or moderate dehydration can often be treated at home. Treatment should be started right away. Do not wait until dehydration becomes severe. Severe dehydration is an emergency and it needs to be treated in a hospital. Treatment for mild dehydration may include:   Drinking more fluids.  Replacing salts and minerals in your blood (electrolytes) that you may have lost. Treatment for moderate dehydration may include:   Drinking an oral rehydration solution (ORS). This is a drink that helps you replace fluids and electrolytes (rehydrate). It can be found at pharmacies and retail stores. Treatment for severe dehydration may include:   Receiving fluids through an IV tube.  Receiving an electrolyte solution through a feeding tube that is   passed through your nose and into your stomach (nasogastric tube, or NG tube).  Correcting any abnormalities in electrolytes.  Treating the underlying cause of dehydration. Follow these instructions at home:  If directed by your health care provider, drink an ORS:  Make an ORS by following instructions on the  package.  Start by drinking small amounts, about  cup (120 mL) every 5-10 minutes.  Slowly increase how much you drink until you have taken the amount recommended by your health care provider.  Drink enough clear fluid to keep your urine clear or pale yellow. If you were told to drink an ORS, finish the ORS first, then start slowly drinking other clear fluids. Drink fluids such as:  Water. Do not drink only water. Doing that can lead to having too little salt (sodium) in the body (hyponatremia).  Ice chips.  Fruit juice that you have added water to (diluted fruit juice).  Low-calorie sports drinks.  Avoid:  Alcohol.  Drinks that contain a lot of sugar. These include high-calorie sports drinks, fruit juice that is not diluted, and soda.  Caffeine.  Foods that are greasy or contain a lot of fat or sugar.  Take over-the-counter and prescription medicines only as told by your health care provider.  Do not take sodium tablets. This can lead to having too much sodium in the body (hypernatremia).  Eat foods that contain a healthy balance of electrolytes, such as bananas, oranges, potatoes, tomatoes, and spinach.  Keep all follow-up visits as told by your health care provider. This is important. Contact a health care provider if:  You have abdominal pain that:  Gets worse.  Stays in one area (localizes).  You have a rash.  You have a stiff neck.  You are more irritable than usual.  You are sleepier or more difficult to wake up than usual.  You feel weak or dizzy.  You feel very thirsty.  You have urinated only a small amount of very dark urine over 6-8 hours. Get help right away if:  You have symptoms of severe dehydration.  You cannot drink fluids without vomiting.  Your symptoms get worse with treatment.  You have a fever.  You have a severe headache.  You have vomiting or diarrhea that:  Gets worse.  Does not go away.  You have blood or green matter  (bile) in your vomit.  You have blood in your stool. This may cause stool to look black and tarry.  You have not urinated in 6-8 hours.  You faint.  Your heart rate while sitting still is over 100 beats a minute.  You have trouble breathing. This information is not intended to replace advice given to you by your health care provider. Make sure you discuss any questions you have with your health care provider. Document Released: 04/12/2005 Document Revised: 11/07/2015 Document Reviewed: 06/06/2015 Elsevier Interactive Patient Education  2017 Elsevier Inc.  

## 2016-04-27 NOTE — Telephone Encounter (Signed)
Schedule request sent.  

## 2016-04-27 NOTE — Assessment & Plan Note (Addendum)
Albumin has decreased from 3.3 down to 2.2.  Patient was encouraged to push protein in his diet is much as possible.

## 2016-04-27 NOTE — Telephone Encounter (Signed)
Seen in Ed Friday. Still is not doing well. He is bed bound or chair bound. He is Incontinent of diarrhea stool , "wearing depends, cannot walk without help legs very weak, he cannot stay by himself"  Fever 101F Yesterday . On doxycycline since 12/22 , cough no better despite  taking promethazine Dm ( per Ed MD) .  Per Dr [name], pt to come into [facility]

## 2016-04-27 NOTE — Assessment & Plan Note (Signed)
Patient had a few episodes of diarrhea over the weekend; and feels somewhat dehydrated.  He presented to the emergency department this past Friday for complaint; received IV fluid rehydration.  He states he's had no longer has any issues with diarrhea.

## 2016-04-27 NOTE — Assessment & Plan Note (Signed)
Patient had some issues with diarrhea over this past weekend; and felt dehydrated.  Sodium was decreased to 133 today; creatinine was increased to 1.7.  Patient received 1 L normal saline IV fluid rehydration.  Patient was also encouraged push fluids is much as possible.

## 2016-04-28 ENCOUNTER — Telehealth: Payer: Self-pay | Admitting: Medical Oncology

## 2016-04-28 NOTE — Telephone Encounter (Signed)
April called today and stated pt wants hospice to come in to his house and does he need to stop sutent?

## 2016-04-28 NOTE — Telephone Encounter (Signed)
I told [name] that [facility] said he can finish his sutent.

## 2016-04-29 NOTE — ED Provider Notes (Signed)
[facility] DEPT Provider Note   CSN: [id number] Arrival date & time: 04/23/16  9833     History   Chief Complaint Chief Complaint  Patient presents with  . Shortness of Breath  . Abdominal Pain    HPI Patrick WEEDON Sr. is a 71 y.o. male.  HPI Patient presents to the emergency department with generalized weakness over the last 2 days along with diarrhea.  The patient states that he has had decreased appetite over the last few weeks.  Patient states he has not been drinking much as well.  The patient states she has had some mild discomfort in his abdomen also had posttussive emesis she has had significant cough over that timeframe.  The patient states that he has not seen his oncologist for the symptoms, states that he did call their office and advise them that he was coming to the emergency department.  The patient states that nothing seems make the condition better or worse The patient denies chest pain, shortness of breath, headache,blurred vision, neck pain, fever,  numbness, dizziness, anorexia, edema, abdominal pain, nausea, vomiting,  rash, back pain, dysuria, hematemesis, bloody stool, near syncope, or syncope. Past Medical History:  Diagnosis Date  . AAA (abdominal aortic aneurysm) ([place])   . Anxiety   . Asthma   . Cancer [facility])    Renal Cell- 2013; Lung 2017  . Chronic kidney disease    Renal Cell Adenocarcinoma  . Chronic kidney disease    Left Nephrectomy  . COPD (chronic obstructive pulmonary disease) ([place])   . Coronary artery disease   . Dehydration 12/16/2015  . Depression   . Diabetes mellitus without complication ([place])    Type II  . Eczema   . Encounter for antineoplastic chemotherapy 08/19/2015  . Encounter for antineoplastic immunotherapy 01/06/2016  . GERD (gastroesophageal reflux disease)   . Headache   . Heart murmur   . HPTH (hyperparathyroidism) ([place])   . Hx of radiation therapy 08/20/15   Right Temporal brain  . Hyperlipidemia   .  Hyperparathyroidism ([place])   . Hypertension   . Hypertension 08/19/2015  . Hypothyroidism   . Malignant neoplasm of left kidney ([place]) 02/09/2013  . Melena 03/17/2016  . Neuropathy ([place])   . Shortness of breath dyspnea   . Sleep apnea    CPAP  . Thoracic aortic aneurysm [facility]) 2017    Patient Active Problem List   Diagnosis Date Noted  . Hypoalbuminemia due to protein-calorie malnutrition ([place]) 04/27/2016  . Chemotherapy-induced thrombocytopenia 04/27/2016  . Unintentional weight loss 04/20/2016  . Bronchitis 04/20/2016  . Pneumonitis 03/22/2016  . Melena 03/17/2016  . 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 Cough with hemoptysis 03/02/2016  . Encounter for antineoplastic immunotherapy 01/06/2016  . Diarrhea 12/19/2015  . Acute renal failure (ARF) ([place]) 12/19/2015  . Dehydration 12/16/2015  . Encounter for antineoplastic chemotherapy 08/19/2015  . Hypertension 08/19/2015  . Brain metastasis ([place]) 08/18/2015  . Chronic obstructive pulmonary disease ([place]) 02/13/2015  . Personal history of other malignant neoplasm of kidney 02/13/2015  . Hyperparathyroidism ([place]) 02/13/2015  . Type 2 diabetes mellitus ([place]) 02/13/2015  . Arteriosclerosis of coronary artery 12/09/2014  . CAFL (chronic airflow limitation) ([place]) 12/09/2014  . Enterogastritis 12/09/2014  . Acid reflux 12/09/2014  . HPTH (hyperparathyroidism) ([place]) 12/09/2014  . BP (high blood pressure) 12/09/2014  . Eczema intertrigo 12/09/2014  . Obstructive apnea 12/09/2014  . COPD, mild ([place]) 09/21/2014  . Atherosclerosis of coronary artery 09/21/2014  .  Clinical depression 09/21/2014  . Diabetes mellitus, type 2 ([place]) 09/21/2014  . Essential (primary) hypertension 09/21/2014  . Cardiac murmur 09/21/2014  . HLD (hyperlipidemia) 09/21/2014  . Adult hypothyroidism 09/21/2014  . Adiposity 09/21/2014  . Adenocarcinoma, renal cell ([place]) 09/21/2014  . Malignant neoplasm of left kidney ([place]) 02/09/2013     Past Surgical History:  Procedure Laterality Date  . BACK SURGERY     compressed vertebrae-put in a plate to seprate the spaces  . CARDIAC CATHETERIZATION    . CERVICAL FUSION    . CORONARY ARTERY BYPASS GRAFT     x 5  . ENDOBRONCHIAL ULTRASOUND N/A 06/30/2015   Procedure: ENDOHIAL ULTRASOUND;  Surgeon: Flora Lipps, MD;  Location: [facility] ORS;  Service: Cardiopulmonary;  Laterality: N/A;  . FRACTURE SURGERY     left arm repair  . KNEE SURGERY     left knee repair  . PARATHYROIDECTOMY  12/17/09  . VIDEO BRONCHOSCOPY WITH ENDOBRONCHIAL ULTRASOUND N/A 07/09/2015   Procedure: VIDEO BRONCHOSCOPY WITH ENDOBRONCHIAL ULTRASOUND;  Surgeon: Ivin Poot, MD;  Location: [facility] OR;  Service: Thoracic;  Laterality: N/A;       Home Medications    Prior to Admission medications   Medication Sig Start Date End Date Taking? Authorizing Provider  ALPRAZolam (XANAX) 0.5 MG tablet TAKE ONE TABLET BY MOUTH TWICE DAILY AS NEEDED Patient taking differently: TAKE ONE TABLET BY MOUTH TWICE DAILY AS NEEDED FOR ANXIETY 04/15/16  Yes Richard Maceo Pro., MD  amLODipine (NORVASC) 10 MG tablet Take 10 mg by mouth daily.   Yes Historical Provider, MD  aspirin EC 81 MG tablet Take 1 tablet (81 mg total) by mouth at bedtime. Patient not taking: Reported on 04/27/2016 03/08/16  Yes Clanford Marisa Hua, MD  calcium carbonate (OSCAL) 1500 (600 Ca) MG TABS tablet Take 600 mg of elemental calcium by mouth 2 (two) times daily with a meal.   Yes Historical Provider, MD  cholecalciferol (VITAMIN D) 1000 units tablet Take 1,000 Units by mouth at bedtime.   Yes Historical Provider, MD  doxycycline (VIBRA-TABS) 100 MG tablet Take 1 tablet (100 mg total) by mouth 2 (two) times daily. 04/16/16  Yes Susanne Borders, NP  FLUoxetine (PROZAC) 40 MG capsule TAKE ONE CAPSULE BY MOUTH EVERY DAY 04/06/16  Yes Richard Maceo Pro., MD  Fluticasone-Salmeterol (ADVAIR) 100-50 MCG/DOSE AEPB Inhale 1 puff into the lungs 2 (two) times daily.    Yes Historical Provider, MD  SUNItinib (SUTENT) 37.5 MG capsule Take 1 capsule (37.5 mg total) by mouth daily. 03/29/16  Yes Curt Bears, MD  emollient (BIAFINE) cream Apply topically as needed. Patient not taking: Reported on 04/23/2016 04/05/16   Eppie Gibson, MD  feeding supplement, ENSURE ENLIVE, (ENSURE ENLIVE) LIQD Take 237 mLs by mouth 2 (two) times daily between meals. Patient not taking: Reported on 04/27/2016 03/04/16   Clanford Marisa Hua, MD  FeFum-FePoly-FA-B Cmp-C-Biot (INTEGRA PLUS) CAPS Take 1 capsule by mouth every morning. 01/06/16   Curt Bears, MD  FLUoxetine (PROZAC) 40 MG capsule Take 40 mg by mouth daily.    Historical Provider, MD  lidocaine (XYLOCAINE) 2 % solution Mix 1 part 2%viscous lidocaine,1part H2O.Swish and/or swallow 18m of this mixture,331m before meals and at bedtime, up to QID 03/29/16   SaEppie GibsonMD  meclizine (ANTIVERT) 25 MG tablet Take 25 mg by mouth 3 (three) times daily as needed for dizziness.  06/18/13   Historical Provider, MD  methylPREDNISolone (MEDROL DOSEPAK) 4 MG TBPK tablet Medrol dose pak: Take  as directed. 04/27/16   Susanne Borders, NP  mometasone (ELOCON) 0.1 % cream Apply 1 application topically 2 (two) times daily as needed (for itching).    Historical Provider, MD  PROAIR HFA 108 [phone] Base) MCG/ACT inhaler INHALE 1 OR 2 PUFFS AS NEEDED 03/08/16   [name]., MD  prochlorperazine (COMPAZINE) 10 MG tablet TAKE 1 TABLET EVERY 6 HOURS AS NEEDED FOR NAUSEA AND VOMITING 03/30/16   Curt Bears, MD  promethazine-dextromethorphan (PROMETHAZINE-DM) 6.25-15 MG/5ML syrup Take 5 mLs by mouth 4 (four) times daily as needed for cough. 04/23/16   Dalia Heading, PA-C  sucralfate (CARAFATE) 1 g tablet Dissolve 1 tablet in 48m H2O and swallow up to QID,PRN sore throat. Patient not taking: Reported on 04/27/2016 03/29/16   SEppie Gibson MD    Family History Family History  Problem Relation Age of Onset  . Stroke Mother   .  Cardiomyopathy Mother   . Hypertension Mother   . Hypertension Father   . Congestive Heart Failure Father   . Heart disease Brother   . Heart attack Brother   . Hypertension Brother   . Diabetes Brother   . Obesity Brother   . Heart disease Brother   . Sleep apnea Son     Social History Social History  Substance Use Topics  . Smoking status: Former Smoker    Packs/day: 1.50    Years: 35.00    Types: Cigarettes    Quit date: 12/24/2001  . Smokeless tobacco: Never Used  . Alcohol use No     Allergies   Amoxicillin; Codeine; Penicillins; and Ace inhibitors   Review of Systems Review of Systems All other systems negative except as documented in the HPI. All pertinent positives and negatives as reviewed in the HPI.  Physical Exam Updated Vital Signs BP 118/81   Pulse 87   Temp 98.8 F (37.1 C) (Oral)   Resp 17   Ht '5\' 11"'$  (1.803 m)   Wt 102.1 kg   SpO2 96%   BMI 31.38 kg/m   Physical Exam  Constitutional: He is oriented to person, place, and time. He appears well-developed and well-nourished. No distress.  HENT:  Head: Normocephalic and atraumatic.  Mouth/Throat: Oropharynx is clear and moist.  Eyes: Pupils are equal, round, and reactive to light.  Neck: Normal range of motion. Neck supple.  Cardiovascular: Normal rate, regular rhythm and normal heart sounds.  Exam reveals no gallop and no friction rub.   No murmur heard. Pulmonary/Chest: Effort normal and breath sounds normal. No respiratory distress. He has no wheezes.  Abdominal: Soft. Bowel sounds are normal. He exhibits no distension. There is no tenderness.  Neurological: He is alert and oriented to person, place, and time. He exhibits normal muscle tone. Coordination normal.  Skin: Skin is warm and dry. No rash noted. No erythema.  Psychiatric: He has a normal mood and affect. His behavior is normal.  Nursing note and vitals reviewed.    ED Treatments / Results  Labs (all labs ordered are listed,  but only abnormal results are displayed) Labs Reviewed  BASIC METABOLIC PANEL - Abnormal; Notable for the following:       Result Value   Sodium 133 (*)    CO2 21 (*)    Glucose, Bld 109 (*)    BUN 37 (*)    Creatinine, Ser 1.41 (*)    Calcium 8.6 (*)    GFR calc non Af Amer 49 (*)    GFR calc  Af Amer 57 (*)    All other components within normal limits  CBC WITH DIFFERENTIAL/PLATELET - Abnormal; Notable for the following:    Hemoglobin 12.3 (*)    HCT 35.9 (*)    Platelets 93 (*)    Lymphs Abs 0.5 (*)    All other components within normal limits    EKG  EKG Interpretation  Date/Time:  Friday April 23 2016 15:18:06 EST Ventricular Rate:  115 PR Interval:    QRS Duration: 90 QT Interval:  327 QTC Calculation: 453 R Axis:   35 Text Interpretation:  Sinus tachycardia Abnormal R-wave progression, early transition Baseline wander in lead(s) V4 Confirmed by Winfred Leeds  MD, SAM [phone]) on 04/23/2016 3:27:59 PM       Radiology Mr Jeri Cos Wo Contrast  Result Date: 04/27/2016 CLINICAL DATA:  Brain metastasis. Increased confusion. Renal cell cancer, lung cancer EXAM: MRI HEAD WITHOUT AND WITH CONTRAST TECHNIQUE: Multiplanar, multiecho pulse sequences of the brain and surrounding structures were obtained without and with intravenous contrast. CONTRAST:  39m MULTIHANCE GADOBENATE DIMEGLUMINE 529 MG/ML IV SOLN COMPARISON:  MRI head 02/05/2016, 12/08/2015, 11/03/2015 FINDINGS: Brain: No new metastatic deposits identified. The small enhancing lesion in the right temporal horn region near the choroid has improved and is now normal. This may have been a metastasis of the ventricular ependyma or the choroid plexus based on prior studies when it was significantly larger. Small lesion in the right mesial temporal lobe has resolved. Negative for acute infarct. Previously noted small focus of restricted diffusion in the left frontal operculum has resolved and may have been an area of subacute  infarction on the prior study. There is moderate chronic microvascular ischemic change throughout the white matter. 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 No acute infarct on today's study. Negative for hemorrhage. No edema. 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 Vascular: Normal arterial flow voids. Skull and upper cervical spine: No worrisome bone lesions. Sinuses/Orbits: Mild mucosal edema in the paranasal sinuses. Other: None IMPRESSION: No acute intracranial abnormality. Resolving enhancing lesion in the region of the choroid plexus in the right temporal horn which is smaller today likely due to treatment. Small right medial temporal lobe lesion no longer visualized. No new enhancing lesions identified. Electronically Signed   By: CFranchot GalloM.D.   On: 04/27/2016 16:07    Procedures Procedures (including critical care time)  Medications Ordered in ED Medications  sodium chloride 0.9 % bolus 1,000 mL (0 mLs Intravenous Stopped 04/23/16 1708)  iopamidol (ISOVUE-370) 76 % injection 100 mL (100 mLs Intravenous Contrast Given 04/23/16 1748)  ondansetron (ZOFRAN) injection 4 mg (4 mg Intravenous Given 04/23/16 1825)  sodium chloride 0.9 % bolus 500 mL (0 mLs Intravenous Stopped 04/23/16 2030)     Initial Impression / Assessment and Plan / ED Course  I have reviewed the triage vital signs and the nursing notes.  Pertinent labs & imaging results that were available during my care of the patient were reviewed by me and considered in my medical decision making (see chart for details).  Clinical Course     Patient is feeling better following IV fluids and will give him control for his symptoms of cough.  The patient is advised he will need to follow-up with his primary Dr. for further evaluation.  Told to return here as needed.  Patient agrees the plan and all questions were answered   Final Clinical Impressions(s) / ED Diagnoses   Final diagnoses:  Dehydration  Lung mass    New Prescriptions Discharge Medication List as of 04/23/2016  8:07 PM    START taking these medications   Details  promethazine-dextromethorphan (PROMETHAZINE-DM) 6.25-15 MG/5ML syrup Take 5 mLs by mouth 4 (four) times daily as needed for cough., Starting Fri 04/23/2016, Print         [employer], PA-C 04/29/16 [place], MD 04/30/16 0130

## 2016-05-03 ENCOUNTER — Telehealth: Payer: Self-pay | Admitting: Medical Oncology

## 2016-05-03 NOTE — Telephone Encounter (Signed)
I returned [name] call and instructed her to contact provider who ordered CPAP machine if he needs another one.

## 2016-05-04 ENCOUNTER — Telehealth: Payer: Self-pay | Admitting: Family Medicine

## 2016-05-04 ENCOUNTER — Other Ambulatory Visit: Payer: Medicare Other

## 2016-05-04 ENCOUNTER — Ambulatory Visit: Payer: Medicare Other | Admitting: Internal Medicine

## 2016-05-04 ENCOUNTER — Ambulatory Visit: Payer: Medicare Other

## 2016-05-04 ENCOUNTER — Other Ambulatory Visit: Payer: Self-pay

## 2016-05-04 MED ORDER — LEVALBUTEROL HCL 1.25 MG/3ML IN NEBU
1.2500 mg | INHALATION_SOLUTION | RESPIRATORY_TRACT | 12 refills | Status: DC | PRN
Start: 2016-05-04 — End: 2016-12-02

## 2016-05-04 NOTE — Telephone Encounter (Signed)
[name] was advised this is ok-aa

## 2016-05-04 NOTE — Telephone Encounter (Signed)
[name] with [name] is requesting a verbal order for a new cpap machine.  The machine and tubing are both over 70 years old.  It is working but pt is having trouble cutting the machine on and off.  She can order this through Hospice and there will be no cost.  [phone]

## 2016-05-05 NOTE — Telephone Encounter (Signed)
[name] with [facility] stated that she had spoke with [facility] and they require an Rx for the new CPAP machine and fitted mask to be faxed to [facility] Fax# [phone]. [name] stated that this is important b/c pt really needs the new equipment. Please advise. Thanks TNP

## 2016-05-05 NOTE — Telephone Encounter (Signed)
CPAP order faxed to [facility].

## 2016-05-06 ENCOUNTER — Telehealth: Payer: Self-pay | Admitting: Family Medicine

## 2016-05-06 NOTE — Telephone Encounter (Signed)
Please review. Thanks!  

## 2016-05-06 NOTE — Telephone Encounter (Signed)
Will re fax this and will put the pressure we have on file-aa

## 2016-05-06 NOTE — Telephone Encounter (Signed)
to

## 2016-05-06 NOTE — Telephone Encounter (Signed)
[name] with [facility] stated she spoke with [facility] about getting pt the new CPAP machine and they have now advised her they need the CPAP settings from [place] faxed to [facility]. [name] is requesting that we send the CPAP setting to  Fax# [phone]. Please advise. Thanks TNP

## 2016-05-07 ENCOUNTER — Ambulatory Visit: Payer: Self-pay | Admitting: Radiation Oncology

## 2016-05-07 ENCOUNTER — Other Ambulatory Visit: Payer: Medicare Other

## 2016-05-07 ENCOUNTER — Telehealth: Payer: Self-pay

## 2016-05-07 NOTE — Telephone Encounter (Signed)
Ms. [name] called to inform Dr. Rosanna Jones that patients voice has improved since he recommended nebulizer treatment on Tuesday. [name] wanted doctor to know that at her home visit last night patient stated " I keep seeing red dots, 10 red dots". [name] states that later on in the evening patient had made the same comment when he was looking at his white bed sheets. She states that she contacted nurse from cancer center to let them be aware of this matter and they informed her to contact PCP. [name] states otherwise patient seems to be fine she states that he has no symptoms of stroke at this time and his blood sugar when checked was 120. [name] states that patients wife is requesting the Dr. Rosanna Jones would refill patients prescription for Iron and Doxycycline. I asked [name] why she was requesting that Doxycycline needs to be refilled she states that patient had lung infection and was orginally given 20tablets. I informed nurse that Dr. Rosanna Jones was out of the office today and I would forward message to him and MD in office to review. KW

## 2016-05-10 ENCOUNTER — Telehealth: Payer: Self-pay

## 2016-05-10 ENCOUNTER — Telehealth: Payer: Self-pay | Admitting: Internal Medicine

## 2016-05-10 NOTE — Telephone Encounter (Signed)
[name] from [facility] called. She did a home visit on Friday. The family states Dr [name] placed pt on 20 days of doxycycline for a lung infection. The 20 days are completed. They are asking if he needs to continue the medication. Attempted to call pt at home to see how he is feeling today with no answer.

## 2016-05-10 NOTE — Telephone Encounter (Signed)
[name] called to see if the CPAP fax had been re-faxed including the pressure. [name] stated that she contacted [facility] and was told they had not received the fax. Please advise. Thanks TNP

## 2016-05-10 NOTE — Telephone Encounter (Signed)
Spoke with [name] from hospice and advised her of below from Dr Caryn Section. They will contact oncologist office in regards to Doxy. Patient is not in distress no stroke like symptoms. He is still see dots as mentioned below. Any thoughts on that? [name] advised that you are not in the office today and you were not here on Friday 05/07/16-aa

## 2016-05-10 NOTE — Telephone Encounter (Signed)
The last doxycycline prescription was by the written when he was at the cancer center in December for  Bronchitis. If this has not cleared up he needs either follow up with oncology or be seen here.

## 2016-05-10 NOTE — Telephone Encounter (Signed)
Pt wife called to cxl 1/17 appt due to pt being on hospice care

## 2016-05-10 NOTE — Telephone Encounter (Signed)
Cheron Every that this was done last week and I will re fax it again, fax number was verified-aa

## 2016-05-11 ENCOUNTER — Telehealth: Payer: Self-pay | Admitting: *Deleted

## 2016-05-11 NOTE — Telephone Encounter (Signed)
FYI "I need to know what time my appointment is tomorrow.  If it snows what happens?" Appointment is tomorrow at 11:00 am.  [name] is open.   "I'll call in the morning, I may not be able to get there."

## 2016-05-12 ENCOUNTER — Other Ambulatory Visit: Payer: Medicare Other

## 2016-05-12 ENCOUNTER — Ambulatory Visit: Payer: Medicare Other | Admitting: Family Medicine

## 2016-05-12 ENCOUNTER — Ambulatory Visit: Admission: RE | Admit: 2016-05-12 | Payer: Medicare Other | Source: Ambulatory Visit | Admitting: Radiation Oncology

## 2016-05-12 ENCOUNTER — Ambulatory Visit: Payer: Medicare Other | Admitting: Internal Medicine

## 2016-05-13 ENCOUNTER — Ambulatory Visit: Payer: Medicare Other | Admitting: Family Medicine

## 2016-05-14 ENCOUNTER — Other Ambulatory Visit: Payer: Self-pay | Admitting: Internal Medicine

## 2016-05-17 ENCOUNTER — Encounter: Payer: Self-pay | Admitting: Radiation Oncology

## 2016-05-18 ENCOUNTER — Ambulatory Visit: Admission: RE | Admit: 2016-05-18 | Payer: Medicare Other | Source: Ambulatory Visit | Admitting: Radiation Oncology

## 2016-05-18 ENCOUNTER — Telehealth: Payer: Self-pay | Admitting: Family Medicine

## 2016-05-18 HISTORY — DX: Personal history of irradiation: Z92.3

## 2016-05-18 NOTE — Telephone Encounter (Signed)
[name] with Hospice called stating pt has a lot of sinus, throat and chest congestion.  Pt is coughing up a light yellow phlegm.  Pt is request an appointment on Thursday afternoon after 2:00 if possible. Can pt be worked in?  HC#[phone] or [phone]/MW   Pt wanted to advise that he will no longer  be seeing the doctors at the [facility].   Pt blood sugar after eating was 268.  Pt took his blood sugar again  6 hours later and it was 169.    Pt is having some constipation.  Pt has miralax so they are asking if it is ok to use. VV#[phone]/YI

## 2016-05-18 NOTE — Telephone Encounter (Signed)
Pt advised, appointment made and advised patients' wife and [name] is ok to [place]

## 2016-05-20 ENCOUNTER — Ambulatory Visit (INDEPENDENT_AMBULATORY_CARE_PROVIDER_SITE_OTHER): Payer: Medicare Other | Admitting: Family Medicine

## 2016-05-20 ENCOUNTER — Encounter: Payer: Self-pay | Admitting: Family Medicine

## 2016-05-20 ENCOUNTER — Ambulatory Visit: Payer: Medicare Other | Admitting: Family Medicine

## 2016-05-20 VITALS — BP 130/62 | HR 108 | Temp 97.7°F | Resp 18 | Wt 207.0 lb

## 2016-05-20 DIAGNOSIS — C7931 Secondary malignant neoplasm of brain: Secondary | ICD-10-CM | POA: Diagnosis not present

## 2016-05-20 DIAGNOSIS — C7802 Secondary malignant neoplasm of left lung: Secondary | ICD-10-CM | POA: Diagnosis not present

## 2016-05-20 DIAGNOSIS — C642 Malignant neoplasm of left kidney, except renal pelvis: Secondary | ICD-10-CM

## 2016-05-20 DIAGNOSIS — R5383 Other fatigue: Secondary | ICD-10-CM | POA: Diagnosis not present

## 2016-05-20 DIAGNOSIS — J40 Bronchitis, not specified as acute or chronic: Secondary | ICD-10-CM | POA: Diagnosis not present

## 2016-05-20 MED ORDER — LEVOFLOXACIN 500 MG PO TABS: 500.0000 mg | ORAL_TABLET | Freq: Every day | ORAL | 1 refills | Status: DC

## 2016-05-20 NOTE — Patient Instructions (Addendum)
Try Mira lax OTC if this does not work try [employer]. If 2-3 days of the Mira lax does not work then try Doculax.

## 2016-05-20 NOTE — Progress Notes (Signed)
Subjective:  HPI Pt is here today for cough and congestion. Congestion is yellow, dark and mixed with blood. He does have a productive cough as well. He reports that this has been going on for about a month. Started about the time he finished his last radiation treatment. He is no longer taking treatments at this time. He reports that he is also weak and can not walk well. He also has a lot of post nasal drainage, and hoarseness. Denies fevers, chills, sweats. Family thinks a lot of it is coming from no longer doing treatments and the progression of the cancer.   He also states that he is very constipated. He has tried metamucil with no relief. His last BM was almost 4 days ago.   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  Prior to Admission medications   Medication Sig Start Date End Date Taking? Authorizing Provider  ALPRAZolam (XANAX) 0.5 MG tablet TAKE ONE TABLET BY MOUTH TWICE DAILY AS NEEDED Patient taking differently: TAKE ONE TABLET BY MOUTH TWICE DAILY AS NEEDED FOR ANXIETY 04/15/16   [name]., MD  amLODipine (NORVASC) 10 MG tablet Take 10 mg by mouth daily.    Historical Provider, MD  aspirin EC 81 MG tablet Take 1 tablet (81 mg total) by mouth at bedtime. Patient not taking: Reported on 04/27/2016 03/08/16   Clanford Marisa Hua, MD  calcium carbonate (OSCAL) 1500 (600 Ca) MG TABS tablet Take 600 mg of elemental calcium by mouth 2 (two) times daily with a meal.    Historical Provider, MD  cholecalciferol (VITAMIN D) 1000 units tablet Take 1,000 Units by mouth at bedtime.    Historical Provider, MD  doxycycline (VIBRA-TABS) 100 MG tablet Take 1 tablet (100 mg total) by mouth 2 (two) times daily. 04/16/16   Susanne Borders, NP  emollient (BIAFINE) cream Apply topically as needed. Patient not taking: Reported on 04/23/2016 04/05/16   Eppie Gibson, MD  feeding supplement, ENSURE ENLIVE, (ENSURE ENLIVE) LIQD Take 237 mLs by mouth 2 (two) times daily between  meals. Patient not taking: Reported on 04/27/2016 03/04/16   Clanford Marisa Hua, MD  FeFum-FePoly-FA-B Cmp-C-Biot [facility]) CAPS TAKE ONE CAPSULE BY MOUTH EVERY MORNING 05/14/16   Curt Bears, MD  FLUoxetine (PROZAC) 40 MG capsule Take 40 mg by mouth daily.    Historical Provider, MD  FLUoxetine (PROZAC) 40 MG capsule TAKE ONE CAPSULE BY MOUTH EVERY DAY 04/06/16   [name]., MD  Fluticasone-Salmeterol (ADVAIR) 100-50 MCG/DOSE AEPB Inhale 1 puff into the lungs 2 (two) times daily.    Historical Provider, MD  levalbuterol Penne Lash) 1.25 MG/3ML nebulizer solution Take 1.25 mg by nebulization every 4 (four) hours as needed for wheezing. 05/04/16   Richard Maceo Pro., MD  lidocaine (XYLOCAINE) 2 % solution Mix 1 part 2%viscous lidocaine,1part H2O.Swish and/or swallow 29m of this mixture,338m before meals and at bedtime, up to QID 03/29/16   SaEppie GibsonMD  meclizine (ANTIVERT) 25 MG tablet Take 25 mg by mouth 3 (three) times daily as needed for dizziness.  06/18/13   Historical Provider, MD  methylPREDNISolone (MEDROL DOSEPAK) 4 MG TBPK tablet Medrol dose pak: Take as directed. 04/27/16   CySusanne BordersNP  mometasone (ELOCON) 0.1 % cream Apply 1 application topically 2 (two) times daily as needed (for itching).    Historical Provider, MD  PROAIR HFA 108 ([phone]) MCG/ACT inhaler INHALE 1 OR 2 PUFFS AS NEEDED 03/08/16  Richard Maceo Pro., MD  prochlorperazine (COMPAZINE) 10 MG tablet TAKE 1 TABLET EVERY 6 HOURS AS NEEDED FOR NAUSEA AND VOMITING 03/30/16   Curt Bears, MD  prochlorperazine (COMPAZINE) 10 MG tablet TAKE 1 TABLET EVERY 6 HOURS AS NEEDED FOR NAUSEA AND VOMITING 05/14/16   Curt Bears, MD  promethazine-dextromethorphan (PROMETHAZINE-DM) 6.25-15 MG/5ML syrup Take 5 mLs by mouth 4 (four) times daily as needed for cough. 04/23/16   Dalia Heading, PA-C  sucralfate (CARAFATE) 1 g tablet Dissolve 1 tablet in 66m H2O and swallow up to QID,PRN sore throat. Patient  not taking: Reported on 04/27/2016 03/29/16   SEppie Gibson MD  SUNItinib (SUTENT) 37.5 MG capsule Take 1 capsule (37.5 mg total) by mouth daily. 03/29/16   MCurt Bears MD    Patient Active Problem List   Diagnosis Date Noted  . Hypoalbuminemia due to protein-calorie malnutrition ([place] 04/27/2016  . Chemotherapy-induced thrombocytopenia 04/27/2016  . Unintentional weight loss 04/20/2016  . Bronchitis 04/20/2016  . Pneumonitis 03/22/2016  . Melena 03/17/2016  . 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 Cough with hemoptysis 03/02/2016  . Encounter for antineoplastic immunotherapy 01/06/2016  . Diarrhea 12/19/2015  . Acute renal failure (ARF) ([facility] 12/19/2015  . Dehydration 12/16/2015  . Encounter for antineoplastic chemotherapy 08/19/2015  . Hypertension 08/19/2015  . Brain metastasis ([place] 08/18/2015  . Chronic obstructive pulmonary disease (HL[place] 02/13/2015  . Personal history of other malignant neoplasm of kidney 02/13/2015  . Hyperparathyroidism ([facility] 02/13/2015  . Type 2 diabetes mellitus ([facility] 02/13/2015  . Arteriosclerosis of coronary artery 12/09/2014  . CAFL (chronic airflow limitation) (HE[place] 12/09/2014  . Enterogastritis 12/09/2014  . Acid reflux 12/09/2014  . HPTH (hyperparathyroidism) ([facility] 12/09/2014  . BP (high blood pressure) 12/09/2014  . Eczema intertrigo 12/09/2014  . Obstructive apnea 12/09/2014  . COPD, mild (HPonshewaing 09/21/2014  . Atherosclerosis of coronary artery 09/21/2014  . Clinical depression 09/21/2014  . Diabetes mellitus, type 2 (H[place] 09/21/2014  . Essential (primary) hypertension 09/21/2014  . Cardiac murmur 09/21/2014  . HLD (hyperlipidemia) 09/21/2014  . Adult hypothyroidism 09/21/2014  . Adiposity 09/21/2014  . Adenocarcinoma, renal cell (HS[place] 09/21/2014  . Malignant neoplasm of left kidney (HK[place] 02/09/2013    Past Medical History:  Diagnosis Date  . AAA (abdominal aortic aneurysm) ([place]   . Anxiety   . Asthma     . Cancer ([facility]    Renal Cell- 2013; Lung 2017  . Chronic kidney disease    Renal Cell Adenocarcinoma  . Chronic kidney disease    Left Nephrectomy  . COPD (chronic obstructive pulmonary disease) ([place]   . Coronary artery disease   . Dehydration 12/16/2015  . Depression   . Diabetes mellitus without complication ([place]    Type II  . Eczema   . Encounter for antineoplastic chemotherapy 08/19/2015  . Encounter for antineoplastic immunotherapy 01/06/2016  . GERD (gastroesophageal reflux disease)   . Headache   . Heart murmur   . History of radiation therapy 03/24/16- 04/06/2016   Left Lung/ Mediastinum 30 Gy in 10 fractions  . HPTH (hyperparathyroidism) (HCollins[place]   . Hx of radiation therapy 08/20/15   Right Temporal brain  . Hyperlipidemia   . Hyperparathyroidism (HGr[place]   . Hypertension   . Hypertension 08/19/2015  . Hypothyroidism   . Malignant neoplasm of left kidney (HSeboyeta 02/09/2013  . Melena 03/17/2016  . Neuropathy (H[place] Name   . Shortness of breath dyspnea   . Sleep apnea    CPAP  .  Thoracic aortic aneurysm [facility]) 2017    Social History   Social History  . Marital status: Married    Spouse name: N/A  . Number of children: N/A  . Years of education: N/A   Occupational History  . Not on file.   Social History Main Topics  . Smoking status: Former Smoker    Packs/day: 1.50    Years: 35.00    Types: Cigarettes    Quit date: 12/24/2001  . Smokeless tobacco: Never Used  . Alcohol use No  . Drug use: No  . Sexual activity: Not Currently   Other Topics Concern  . Not on file   Social History Narrative  . No narrative on file    Allergies  Allergen Reactions  . Amoxicillin Anaphylaxis, Rash and Other (See Comments)    Has patient had a PCN reaction causing immediate rash, facial/tongue/throat swelling, SOB or lightheadedness with hypotension: Yes Has patient had a PCN reaction causing severe rash involving mucus membranes or skin necrosis: No Has patient had a PCN  reaction that required hospitalization No Has patient had a PCN reaction occurring within the last 10 years: No If all of the above answers are "NO", then may proceed with Cephalosporin use.  . Codeine Anaphylaxis, Diarrhea and Rash  . Penicillins Anaphylaxis, Rash and Other (See Comments)    Has patient had a PCN reaction causing immediate rash, facial/tongue/throat swelling, SOB or lightheadedness with hypotension: Yes Has patient had a PCN reaction causing severe rash involving mucus membranes or skin necrosis: No Has patient had a PCN reaction that required hospitalization No Has patient had a PCN reaction occurring within the last 10 years: No If all of the above answers are "NO", then may proceed with Cephalosporin use.  . Ace Inhibitors Other (See Comments) and Cough    Reaction:  Wheezing     Review of Systems  Constitutional: Positive for malaise/fatigue.  HENT: Negative.   Eyes: Negative.   Respiratory: Positive for shortness of breath.   Cardiovascular: Negative.   Gastrointestinal: Negative.   Genitourinary: Negative.   Musculoskeletal: Negative.   Skin: Negative.   Neurological: Positive for weakness.  Endo/Heme/Allergies: Negative.   Psychiatric/Behavioral: Negative.     Immunization History  Administered Date(s) Administered  . Influenza, High Dose Seasonal PF 03/13/2015  . Pneumococcal Conjugate-13 03/28/2014  . Pneumococcal Polysaccharide-23 02/12/1998, 11/16/2011  . Td 04/11/2007    Objective:  BP 130/62 (BP Location: Left Arm, Patient Position: Sitting, Cuff Size: Normal)   Pulse (!) 108   Temp 97.7 F (36.5 C) (Oral)   Resp 18   Wt 207 lb (93.9 kg)   SpO2 96%   BMI 28.87 kg/m   Physical Exam  Constitutional: He is oriented to person, place, and time and well-developed, well-nourished, and in no distress.  HENT:  Head: Normocephalic and atraumatic.  Eyes: Conjunctivae and EOM are normal. Pupils are equal, round, and reactive to light.  Neck:  Normal range of motion. Neck supple. No thyromegaly present.  Cardiovascular: Normal rate, regular rhythm, normal heart sounds and intact distal pulses.   Pulmonary/Chest: Effort normal and breath sounds normal.  Abdominal: Soft.  Musculoskeletal: He exhibits edema (trace on the left).  Neurological: He is alert and oriented to person, place, and time. GCS score is 15.  Skin: Skin is warm and dry.  Psychiatric: Mood, memory, affect and judgment normal.    Lab Results  Component Value Date   WBC 3.1 (L) 04/27/2016   HGB 11.2 (L) 04/27/2016  HCT 32.5 (L) 04/27/2016   PLT 58 (L) 04/27/2016   GLUCOSE 106 04/27/2016   CHOL 135 11/21/2013   TRIG 165 (A) 11/21/2013   HDL 38 11/21/2013   LDLCALC 64 11/21/2013   TSH 1.235 01/27/2016   PSA 2.0 11/21/2013   INR 1.10 07/22/2015   HGBA1C 6.1 01/19/2016    CMP     Component Value Date/Time   NA 133 (L) 04/27/2016 1232   K 4.0 04/27/2016 1232   CL 101 04/23/2016 1548   CL 103 01/19/2012 0834   CO2 19 (L) 04/27/2016 1232   GLUCOSE 106 04/27/2016 1232   BUN 34.2 (H) 04/27/2016 1232   CREATININE 1.7 (H) 04/27/2016 1232   CALCIUM 8.1 (L) 04/27/2016 1232   PROT 5.1 (L) 04/27/2016 1232   ALBUMIN 2.2 (L) 04/27/2016 1232   AST 63 (H) 04/27/2016 1232   ALT 42 04/27/2016 1232   ALKPHOS 246 (H) 04/27/2016 1232   BILITOT 0.50 04/27/2016 1232   GFRNONAA 49 (L) 04/23/2016 1548   GFRNONAA >60 01/19/2012 0834   GFRAA 57 (L) 04/23/2016 1548   GFRAA >60 01/19/2012 0834    Assessment and Plan :  1. 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 May need hospice in the near  future.  2. Bronchitis  - levofloxacin (LEVAQUIN) 500 MG tablet; Take 1 tablet (500 mg total) by mouth daily.  Dispense: 7 tablet; Refill: 1 - CBC with Differential/Platelet - Comprehensive metabolic panel  3. Brain metastasis ([place])   4. Malignant neoplasm of left kidney (HCC)   5. Other fatigue  -  TSH   HPI, Exam, and A&P Transcribed under the direction and in the presence of Richard L. Cranford Mon, MD  Electronically Signed: Katina Dung, CMA I have done the exam and reviewed the above chart and it is accurate to the best of my knowledge. Development worker, community has been used in this note in any air is in the dictation or transcription are unintentional.  [facility] 05/20/2016 2:32 PM

## 2016-05-21 LAB — COMPREHENSIVE METABOLIC PANEL
ALBUMIN: 3.5 g/dL (ref 3.5–4.8)
ALK PHOS: 99 IU/L (ref 39–117)
ALT: 16 IU/L (ref 0–44)
AST: 18 IU/L (ref 0–40)
Albumin/Globulin Ratio: 1.3 (ref 1.2–2.2)
BILIRUBIN TOTAL: 0.5 mg/dL (ref 0.0–1.2)
BUN / CREAT RATIO: 20 (ref 10–24)
BUN: 20 mg/dL (ref 8–27)
CHLORIDE: 95 mmol/L — AB (ref 96–106)
CO2: 25 mmol/L (ref 18–29)
Calcium: 10 mg/dL (ref 8.6–10.2)
Creatinine, Ser: 0.98 mg/dL (ref 0.76–1.27)
GFR calc Af Amer: 90 mL/min/{1.73_m2} (ref >59)
GFR calc non Af Amer: 78 mL/min/{1.73_m2} (ref >59)
GLOBULIN, TOTAL: 2.6 g/dL (ref 1.5–4.5)
Glucose: 133 mg/dL — ABNORMAL HIGH (ref 65–99)
POTASSIUM: 4.8 mmol/L (ref 3.5–5.2)
SODIUM: 137 mmol/L (ref 134–144)
Total Protein: 6.1 g/dL (ref 6.0–8.5)

## 2016-05-21 LAB — TSH: TSH: 1.45 u[IU]/mL (ref 0.450–4.500)

## 2016-05-21 LAB — CBC WITH DIFFERENTIAL/PLATELET
Basophils Absolute: 0 10*3/uL (ref 0.0–0.2)
Basos: 0 %
EOS (ABSOLUTE): 0.1 10*3/uL (ref 0.0–0.4)
EOS: 1 %
HEMATOCRIT: 30.6 % — AB (ref 37.5–51.0)
Hemoglobin: 10 g/dL — ABNORMAL LOW (ref 13.0–17.7)
Immature Grans (Abs): 0.3 10*3/uL — ABNORMAL HIGH (ref 0.0–0.1)
Immature Granulocytes: 3 %
LYMPHS ABS: 1.1 10*3/uL (ref 0.7–3.1)
Lymphs: 13 %
MCH: 28.7 pg (ref 26.6–33.0)
MCHC: 32.7 g/dL (ref 31.5–35.7)
MCV: 88 fL (ref 79–97)
MONOS ABS: 0.8 10*3/uL (ref 0.1–0.9)
Monocytes: 9 %
Neutrophils Absolute: 6.5 10*3/uL (ref 1.4–7.0)
Neutrophils: 74 %
PLATELETS: 313 10*3/uL (ref 150–379)
RBC: 3.48 x10E6/uL — AB (ref 4.14–5.80)
RDW: 18.1 % — AB (ref 12.3–15.4)
WBC: 8.8 10*3/uL (ref 3.4–10.8)

## 2016-05-24 ENCOUNTER — Telehealth: Payer: Self-pay

## 2016-05-24 NOTE — Telephone Encounter (Signed)
Pt is requesting a call back from [facility] regarding advise about insomnia. Renaldo Fiddler, CMA

## 2016-05-24 NOTE — Telephone Encounter (Signed)
Spoke with Mrs Winterhalter, she states she will speak with Hospice she states patient called this morning and but was not sure of the details Im guessing he needed to discuss. She states he did not sleep well last night, this was the first time he had trouble with it she thinks. They will let us know if we need to help patient with anything-aa

## 2016-05-26 ENCOUNTER — Telehealth: Payer: Self-pay | Admitting: Family Medicine

## 2016-05-26 NOTE — Telephone Encounter (Signed)
Ok--appropriate due to weight loss.

## 2016-05-26 NOTE — Telephone Encounter (Signed)
Order faxed to [facility].

## 2016-05-26 NOTE — Telephone Encounter (Signed)
[name] with Hospice states pt has rec'd a new C-Pap.  Pt states the setting is to high.  [name] states [facility] is requesting a written and faxed order for a respiratory therapist to go to the home to reset C-Pap meter.  Fax [phone]

## 2016-05-28 ENCOUNTER — Other Ambulatory Visit: Payer: Self-pay | Admitting: Internal Medicine

## 2016-05-28 ENCOUNTER — Telehealth: Payer: Self-pay

## 2016-05-28 NOTE — Telephone Encounter (Signed)
[name] from Hospice called stating that they received fax from Dr. Rosanna Randy earlier this week in reference to [name] machine and approving changing setting on machine. Respiratory therapist states that Dr. Rosanna Randy needs to specify numerical setting so it can be adjusted. Currently patients setting on machine is at a 9 which patient states is to strong. [name] states that setting for [name] goes from 4-9. She states that she can be reached at number listed. KW

## 2016-05-29 NOTE — Telephone Encounter (Signed)
Lmtcb, need to know if we need to write the current setting or the range of settings on the order?-aa

## 2016-05-31 NOTE — Telephone Encounter (Signed)
Per Dr Rosanna Randy decrease to 7 and follow-aa

## 2016-06-02 ENCOUNTER — Telehealth: Payer: Self-pay | Admitting: Family Medicine

## 2016-06-02 MED ORDER — DILTIAZEM HCL ER 180 MG PO CP24: 180.0000 mg | ORAL_CAPSULE | Freq: Every day | ORAL | 12 refills | Status: DC

## 2016-06-02 NOTE — Telephone Encounter (Signed)
Dr Rosanna Randy reviewed below message. 1) For heart rate start Diltiazem XL 180 mg 1 tablet daily and stop Amlodipine. 2) yes Dr Rosanna Randy will be the attending physician for the patient, if patient goes to Hospice home then the doctors there would need to take over. 3) For cough and drainage try Mucinex or Robitussin generic. [name] and patient advised of above.-aa

## 2016-06-02 NOTE — Telephone Encounter (Signed)
1. [name] with Hospice stated that when the home health came to pt's home this morning his heart rate was elevated. [name] stated that pt's O2 was good but the heart rate reading on his oxygen machine was 119 and 121. Pt advised [name] that when he lays down at night he can feel his heart pounding.   2. [name] requesting a verbal order that Dr. Rosanna Randy is going to be his attending physician since pt is now with Hospice.  3. Pt has been having a runny nose and productive cough in the mornings when coming off the CPAP machine and he is unable to get the mucus out and been swallowing the mucus. [name] is requesting an Rx for medication to lossen the mucus to help pt expel it. [employer].   [name] would like a call back to advise of all the request. Please advise. Thanks TNP

## 2016-06-03 ENCOUNTER — Telehealth: Payer: Self-pay | Admitting: Medical Oncology

## 2016-06-03 NOTE — Telephone Encounter (Signed)
Pt requests change attending to Dr Rosanna Randy his PCP due to proximity.

## 2016-06-04 ENCOUNTER — Other Ambulatory Visit: Payer: Self-pay | Admitting: Internal Medicine

## 2016-06-09 ENCOUNTER — Telehealth: Payer: Self-pay

## 2016-06-09 NOTE — Telephone Encounter (Signed)
Please review. Thanks!  

## 2016-06-09 NOTE — Telephone Encounter (Signed)
[name] with [facility] called to report patient is  Complaining of bilateral flank and hip pain worse on left side. Pain for 2-3 weeks. Patient has been taking Tylenol with no relief. Requesting a different RX for pain.  Patient also has right ear fullness. He recently had a URI and completed Levaquin but ear fullness remained.   CE#[name]

## 2016-06-10 MED ORDER — FLUTICASONE PROPIONATE 50 MCG/ACT NA SUSP: 2.0000 | Freq: Every day | NASAL | 12 refills | Status: DC

## 2016-06-10 MED ORDER — LORATADINE 10 MG PO TABS: 10.0000 mg | ORAL_TABLET | Freq: Every day | ORAL | 11 refills | Status: AC

## 2016-06-10 NOTE — Telephone Encounter (Signed)
Try tramadol 50 mg every 4 hours when necessary pain for now, #100, 5 refills. May need something stronger in the near future.

## 2016-06-10 NOTE — Telephone Encounter (Signed)
Per chart patient allergic to codeine. Can not prescribe this medication. [name] at [facility] advised and I also called patient and left him a voicemail. With that allergy per Dr Rosanna Randy its hard to figure out what pain medication to give. Can try some morphine through Hospice. Also for ear issue try Loratadine and Flonase in case its allergy related. [name] advised. [name] will call us back let us know if patient wants to go that route or patient will call back-aa

## 2016-06-29 ENCOUNTER — Ambulatory Visit: Payer: Medicare Other | Admitting: Family Medicine

## 2016-08-30 ENCOUNTER — Telehealth: Payer: Self-pay

## 2016-08-30 MED ORDER — NAPROXEN 500 MG PO TABS: 500.0000 mg | ORAL_TABLET | Freq: Two times a day (BID) | ORAL | 0 refills | Status: DC

## 2016-08-30 NOTE — Telephone Encounter (Signed)
Sharee Pimple, from Hospice care called, states patient on Friday 08/27/16 was sitting on the bed and dropped his phone and then leaned down to pick it up and since then has had muscular pain in the right rib cage area. He has been using Tylenol and heating pad but still having the constant pain and this is interfering with sleep at night. Sharee Pimple wanted to know if patient could try antiinflammatory. Spoke with Dr Rosanna Randy and advised to try naproxen 500 mg 1 BID for 10 days.

## 2016-09-12 ENCOUNTER — Emergency Department (HOSPITAL_COMMUNITY)

## 2016-09-12 ENCOUNTER — Emergency Department (HOSPITAL_COMMUNITY)
Admission: EM | Admit: 2016-09-12 | Discharge: 2016-09-12 | Disposition: A | Discharge status: Home / Self Care | Attending: Emergency Medicine | Admitting: Emergency Medicine

## 2016-09-12 ENCOUNTER — Encounter (HOSPITAL_COMMUNITY): Payer: Self-pay | Admitting: Emergency Medicine

## 2016-09-12 DIAGNOSIS — Y999 Unspecified external cause status: Secondary | ICD-10-CM | POA: Diagnosis not present

## 2016-09-12 DIAGNOSIS — Y939 Activity, unspecified: Secondary | ICD-10-CM | POA: Diagnosis not present

## 2016-09-12 DIAGNOSIS — E1140 Type 2 diabetes mellitus with diabetic neuropathy, unspecified: Secondary | ICD-10-CM | POA: Diagnosis not present

## 2016-09-12 DIAGNOSIS — Z85118 Personal history of other malignant neoplasm of bronchus and lung: Secondary | ICD-10-CM | POA: Diagnosis not present

## 2016-09-12 DIAGNOSIS — Z85528 Personal history of other malignant neoplasm of kidney: Secondary | ICD-10-CM | POA: Diagnosis not present

## 2016-09-12 DIAGNOSIS — Y929 Unspecified place or not applicable: Secondary | ICD-10-CM | POA: Diagnosis not present

## 2016-09-12 DIAGNOSIS — S42321A Displaced transverse fracture of shaft of humerus, right arm, initial encounter for closed fracture: Secondary | ICD-10-CM | POA: Insufficient documentation

## 2016-09-12 DIAGNOSIS — W19XXXA Unspecified fall, initial encounter: Secondary | ICD-10-CM

## 2016-09-12 DIAGNOSIS — I129 Hypertensive chronic kidney disease with stage 1 through stage 4 chronic kidney disease, or unspecified chronic kidney disease: Secondary | ICD-10-CM | POA: Insufficient documentation

## 2016-09-12 DIAGNOSIS — I2510 Atherosclerotic heart disease of native coronary artery without angina pectoris: Secondary | ICD-10-CM | POA: Insufficient documentation

## 2016-09-12 DIAGNOSIS — C799 Secondary malignant neoplasm of unspecified site: Secondary | ICD-10-CM | POA: Insufficient documentation

## 2016-09-12 DIAGNOSIS — N189 Chronic kidney disease, unspecified: Secondary | ICD-10-CM | POA: Insufficient documentation

## 2016-09-12 DIAGNOSIS — W01198A Fall on same level from slipping, tripping and stumbling with subsequent striking against other object, initial encounter: Secondary | ICD-10-CM | POA: Diagnosis not present

## 2016-09-12 DIAGNOSIS — E1122 Type 2 diabetes mellitus with diabetic chronic kidney disease: Secondary | ICD-10-CM | POA: Insufficient documentation

## 2016-09-12 DIAGNOSIS — E039 Hypothyroidism, unspecified: Secondary | ICD-10-CM | POA: Insufficient documentation

## 2016-09-12 DIAGNOSIS — S4991XA Unspecified injury of right shoulder and upper arm, initial encounter: Secondary | ICD-10-CM | POA: Diagnosis present

## 2016-09-12 DIAGNOSIS — J449 Chronic obstructive pulmonary disease, unspecified: Secondary | ICD-10-CM | POA: Insufficient documentation

## 2016-09-12 LAB — BASIC METABOLIC PANEL
Anion gap: 12 (ref 5–15)
BUN: 30 mg/dL — ABNORMAL HIGH (ref 6–20)
CALCIUM: 10.3 mg/dL (ref 8.9–10.3)
CHLORIDE: 105 mmol/L (ref 101–111)
CO2: 20 mmol/L — ABNORMAL LOW (ref 22–32)
CREATININE: 2.08 mg/dL — AB (ref 0.61–1.24)
GFR, EST AFRICAN AMERICAN: 35 mL/min — AB (ref >60)
GFR, EST NON AFRICAN AMERICAN: 30 mL/min — AB (ref >60)
Glucose, Bld: 107 mg/dL — ABNORMAL HIGH (ref 65–99)
Potassium: 4.2 mmol/L (ref 3.5–5.1)
SODIUM: 137 mmol/L (ref 135–145)

## 2016-09-12 LAB — CBC WITH DIFFERENTIAL/PLATELET
BASOS ABS: 0 10*3/uL (ref 0.0–0.1)
BASOS PCT: 0 %
EOS ABS: 0.2 10*3/uL (ref 0.0–0.7)
Eosinophils Relative: 2 %
HCT: 26.4 % — ABNORMAL LOW (ref 39.0–52.0)
HEMOGLOBIN: 8.3 g/dL — AB (ref 13.0–17.0)
Lymphocytes Relative: 10 %
Lymphs Abs: 1.1 10*3/uL (ref 0.7–4.0)
MCH: 28 pg (ref 26.0–34.0)
MCHC: 31.4 g/dL (ref 30.0–36.0)
MCV: 89.2 fL (ref 78.0–100.0)
Monocytes Absolute: 1 10*3/uL (ref 0.1–1.0)
Monocytes Relative: 9 %
NEUTROS PCT: 79 %
Neutro Abs: 8.7 10*3/uL — ABNORMAL HIGH (ref 1.7–7.7)
Platelets: 308 10*3/uL (ref 150–400)
RBC: 2.96 MIL/uL — AB (ref 4.22–5.81)
RDW: 14.6 % (ref 11.5–15.5)
WBC: 11 10*3/uL — AB (ref 4.0–10.5)

## 2016-09-12 LAB — CBG MONITORING, ED
GLUCOSE-CAPILLARY: 102 mg/dL — AB (ref 65–99)
GLUCOSE-CAPILLARY: 128 mg/dL — AB (ref 65–99)

## 2016-09-12 LAB — PROTIME-INR
INR: 1.09
PROTHROMBIN TIME: 14.1 s (ref 11.4–15.2)

## 2016-09-12 MED ORDER — SODIUM CHLORIDE 0.9 % IV SOLN
INTRAVENOUS | Status: AC | PRN
  Administered 2016-09-12 (×2): 1000 mL via INTRAVENOUS

## 2016-09-12 MED ORDER — ONDANSETRON HCL 4 MG/2ML IJ SOLN
INTRAMUSCULAR | Status: AC | PRN
  Administered 2016-09-12: 4 mg via INTRAVENOUS

## 2016-09-12 MED ORDER — FENTANYL CITRATE (PF) 100 MCG/2ML IJ SOLN
INTRAMUSCULAR | Status: AC
  Administered 2016-09-12: 50 ug
  Filled 2016-09-12: qty 4

## 2016-09-12 MED ORDER — ONDANSETRON HCL 4 MG/2ML IJ SOLN
INTRAMUSCULAR | Status: AC
  Filled 2016-09-12: qty 2

## 2016-09-12 MED ORDER — ACETAMINOPHEN 500 MG PO TABS
1000.0000 mg | ORAL_TABLET | Freq: Once | ORAL | Status: AC
  Administered 2016-09-12: 1000 mg via ORAL
  Filled 2016-09-12: qty 2

## 2016-09-12 MED ORDER — TRAMADOL HCL 50 MG PO TABS
100.0000 mg | ORAL_TABLET | Freq: Once | ORAL | Status: AC
  Administered 2016-09-12: 100 mg via ORAL
  Filled 2016-09-12: qty 2

## 2016-09-12 MED ORDER — TRAMADOL HCL 50 MG PO TABS: 100.0000 mg | ORAL_TABLET | Freq: Four times a day (QID) | ORAL | 0 refills | Status: AC | PRN

## 2016-09-12 MED ORDER — ACETAMINOPHEN 500 MG PO TABS: 1000.0000 mg | ORAL_TABLET | Freq: Four times a day (QID) | ORAL | 0 refills | Status: AC | PRN

## 2016-09-12 MED ORDER — FENTANYL CITRATE (PF) 100 MCG/2ML IJ SOLN
INTRAMUSCULAR | Status: AC | PRN
  Administered 2016-09-12 (×3): 50 ug via INTRAVENOUS

## 2016-09-12 MED ORDER — ETOMIDATE 2 MG/ML IV SOLN
INTRAVENOUS | Status: AC
  Filled 2016-09-12: qty 10

## 2016-09-12 MED ORDER — ETOMIDATE 2 MG/ML IV SOLN
0.1500 mg/kg | Freq: Once | INTRAVENOUS | Status: AC
  Administered 2016-09-12: 10 mg via INTRAVENOUS

## 2016-09-12 NOTE — Sedation Documentation (Signed)
X-ray still at bedside.

## 2016-09-12 NOTE — ED Notes (Signed)
Portable xrays completed.

## 2016-09-12 NOTE — Sedation Documentation (Addendum)
O2 sats down to 87%.  RT began bagging pt.

## 2016-09-12 NOTE — Sedation Documentation (Signed)
Dr. Johnney Killian attempting left EJ without success.

## 2016-09-12 NOTE — ED Notes (Signed)
Family members x 2 at bedside and updated on plan of care.  Bedside report given to [facility], Therapist, sports.

## 2016-09-12 NOTE — Progress Notes (Signed)
Orthopedic Tech Progress Note Patient Details:  [name] Sr. 1945/05/24 [id number]  Ortho Devices Type of Ortho Device: Arm sling Ortho Device/Splint Location: rue Ortho Device/Splint Interventions: Ordered, Application Pt already had a splint on.  Karolee Stamps 09/12/2016, 9:27 PM

## 2016-09-12 NOTE — ED Notes (Signed)
Pt was put in the sitting position and given a sandwich and drink.

## 2016-09-12 NOTE — Sedation Documentation (Signed)
Portable xray at bedside.

## 2016-09-12 NOTE — Progress Notes (Signed)
RT present for moderate sedation to reset arm. Jaw thrust and brief BVM was required due to shallow breaths and sats dropping to 85%. Pt recovered sats into the 90% within 1 minute. Will cont to monitor

## 2016-09-12 NOTE — ED Notes (Signed)
Pt understood dc material. NAD noted. Pt awake and A+Ox4. Ambulatory at dc. Scripts given at [employer]

## 2016-09-12 NOTE — Discharge Instructions (Signed)
Try to elevate the arm on pillows or towels. Apply ice pack to the splint. Do not get the splint wet.  Take tramadol and acetaminophen together for pain control every 6 hours as needed. If Tylenol alone is adequate for pain control, you do not need to continue the tramadol.  Call [facility] orthopedics first thing in the morning to schedule your recheck within the next 2-3 days with either Dr.Ortman or Dr. Amedeo Plenty.

## 2016-09-12 NOTE — ED Provider Notes (Signed)
[facility] DEPT Provider Note   CSN: [id number] Arrival date & time: 09/12/16  1715     History   Chief Complaint Chief Complaint  Patient presents with  . Arm Pain  . Fall    HPI Patrick BIELLO Sr. is a 71 y.o. male.  HPI Patient reports that he lost his balance and tripping over his walker. It caused him to fall and strike his arm on a counter and the back of his head on the armrest of the couch. He denies any headache or loss of consciousness. Patient reports however he has severe pain in his right arm. He was able to maneuver to call for help. He denies he has any other areas of pain in his back, chest, lower extremities. Patient was brought by EMS administered fentanyl to assist in pain control. Patient had significant arm deformity that was padded with quilting for transport. Past Medical History:  Diagnosis Date  . AAA (abdominal aortic aneurysm) ([place])   . Anxiety   . Asthma   . Cancer [facility])    Renal Cell- 2013; Lung 2017  . Chronic kidney disease    Renal Cell Adenocarcinoma  . Chronic kidney disease    Left Nephrectomy  . COPD (chronic obstructive pulmonary disease) ([place])   . Coronary artery disease   . Dehydration 12/16/2015  . Depression   . Diabetes mellitus without complication ([place])    Type II  . Eczema   . Encounter for antineoplastic chemotherapy 08/19/2015  . Encounter for antineoplastic immunotherapy 01/06/2016  . GERD (gastroesophageal reflux disease)   . Headache   . Heart murmur   . History of radiation therapy 03/24/16- 04/06/2016   Left Lung/ Mediastinum 30 Gy in 10 fractions  . HPTH (hyperparathyroidism) ([place])   . Hx of radiation therapy 08/20/15   Right Temporal brain  . Hyperlipidemia   . Hyperparathyroidism ([place])   . Hypertension   . Hypertension 08/19/2015  . Hypothyroidism   . Malignant neoplasm of left kidney ([place]) 02/09/2013  . Melena 03/17/2016  . Neuropathy   . Shortness of breath dyspnea   . Sleep apnea    CPAP  . Thoracic  aortic aneurysm [facility]) 2017    Patient Active Problem List   Diagnosis Date Noted  . Hypoalbuminemia due to protein-calorie malnutrition ([place]) 04/27/2016  . Chemotherapy-induced thrombocytopenia 04/27/2016  . Unintentional weight loss 04/20/2016  . Bronchitis 04/20/2016  . Pneumonitis 03/22/2016  . Melena 03/17/2016  . 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 Cough with hemoptysis 03/02/2016  . Encounter for antineoplastic immunotherapy 01/06/2016  . Diarrhea 12/19/2015  . Acute renal failure (ARF) ([place]) 12/19/2015  . Dehydration 12/16/2015  . Encounter for antineoplastic chemotherapy 08/19/2015  . Hypertension 08/19/2015  . Brain metastasis ([place]) 08/18/2015  . Chronic obstructive pulmonary disease ([place]) 02/13/2015  . Personal history of other malignant neoplasm of kidney 02/13/2015  . Hyperparathyroidism ([place]) 02/13/2015  . Type 2 diabetes mellitus ([place]) 02/13/2015  . Arteriosclerosis of coronary artery 12/09/2014  . CAFL (chronic airflow limitation) ([place]) 12/09/2014  . Enterogastritis 12/09/2014  . Acid reflux 12/09/2014  . HPTH (hyperparathyroidism) ([place]) 12/09/2014  . BP (high blood pressure) 12/09/2014  . Eczema intertrigo 12/09/2014  . Obstructive apnea 12/09/2014  . COPD, mild ([place]) 09/21/2014  . Atherosclerosis of coronary artery 09/21/2014  . Clinical depression 09/21/2014  . Diabetes mellitus, type 2 ([place]) 09/21/2014  . Essential (primary) hypertension 09/21/2014  . Cardiac murmur 09/21/2014  . HLD (hyperlipidemia) 09/21/2014  .  Adult hypothyroidism 09/21/2014  . Adiposity 09/21/2014  . Adenocarcinoma, renal cell ([place]) 09/21/2014  . Malignant neoplasm of left kidney ([place]) 02/09/2013    Past Surgical History:  Procedure Laterality Date  . BACK SURGERY     compressed vertebrae-put in a plate to seprate the spaces  . CARDIAC CATHETERIZATION    . CERVICAL FUSION    . CORONARY ARTERY BYPASS GRAFT     x 5  . ENDOBRONCHIAL ULTRASOUND  N/A 06/30/2015   Procedure: ENDOHIAL ULTRASOUND;  Surgeon: Flora Lipps, MD;  Location: [facility] ORS;  Service: Cardiopulmonary;  Laterality: N/A;  . FRACTURE SURGERY     left arm repair  . KNEE SURGERY     left knee repair  . PARATHYROIDECTOMY  12/17/09  . VIDEO BRONCHOSCOPY WITH ENDOBRONCHIAL ULTRASOUND N/A 07/09/2015   Procedure: VIDEO BRONCHOSCOPY WITH ENDOBRONCHIAL ULTRASOUND;  Surgeon: Ivin Poot, MD;  Location: [facility] OR;  Service: Thoracic;  Laterality: N/A;       Home Medications    Prior to Admission medications   Medication Sig Start Date End Date Taking? Authorizing Provider  ALPRAZolam (XANAX) 0.5 MG tablet TAKE ONE TABLET BY MOUTH TWICE DAILY AS NEEDED Patient taking differently: TAKE ONE TABLET BY MOUTH TWICE DAILY AS NEEDED FOR ANXIETY 04/15/16  Yes [name]., MD  diltiazem (DILACOR XR) 180 MG 24 hr capsule Take 1 capsule (180 mg total) by mouth daily. 06/02/16  Yes [name]., MD  FLUoxetine (PROZAC) 40 MG capsule Take 40 mg by mouth daily.   Yes [provider]  loratadine (CLARITIN) 10 MG tablet Take 1 tablet (10 mg total) by mouth daily. 06/10/16  Yes [name]., MD  senna-docusate (SENOKOT-S) 8.6-50 MG tablet Take 1 tablet by mouth 2 (two) times daily.   Yes [provider]  temazepam (RESTORIL) 15 MG capsule Take 15 mg by mouth at bedtime as needed for sleep.   Yes [provider]  acetaminophen (TYLENOL) 500 MG tablet Take 2 tablets (1,000 mg total) by mouth every 6 (six) hours as needed. 09/12/16   [name], MD  aspirin EC 81 MG tablet Take 1 tablet (81 mg total) by mouth at bedtime. Patient not taking: Reported on 09/12/2016 03/08/16   Murlean Iba, MD  calcium carbonate (OSCAL) 1500 (600 Ca) MG TABS tablet Take 600 mg of elemental calcium by mouth 2 (two) times daily with a meal.    [provider]  cholecalciferol (VITAMIN D) 1000 units tablet Take 1,000 Units by mouth at bedtime.     [provider]  doxycycline (VIBRA-TABS) 100 MG tablet Take 1 tablet (100 mg total) by mouth 2 (two) times daily. Patient not taking: Reported on 05/20/2016 04/16/16   Susanne Borders, NP  emollient (BIAFINE) cream Apply topically as needed. Patient not taking: Reported on 04/23/2016 04/05/16   Eppie Gibson, MD  feeding supplement, ENSURE ENLIVE, (ENSURE ENLIVE) LIQD Take 237 mLs by mouth 2 (two) times daily between meals. Patient not taking: Reported on 04/27/2016 03/04/16   Murlean Iba, MD  FeFum-FePoly-FA-B Cmp-C-Biot Birder Robson) CAPS TAKE ONE CAPSULE BY MOUTH EVERY MORNING Patient not taking: Reported on 09/12/2016 05/14/16   Curt Bears, MD  FLUoxetine (PROZAC) 40 MG capsule TAKE ONE CAPSULE BY MOUTH EVERY DAY Patient not taking: Reported on 09/12/2016 04/06/16   [name]., MD  fluticasone [facility]) 50 MCG/ACT nasal spray Place 2 sprays into both nostrils daily. Patient not taking: Reported on 09/12/2016 06/10/16   Rosanna Randy,  Retia Passe., MD  Fluticasone-Salmeterol (ADVAIR) 100-50 MCG/DOSE AEPB Inhale 1 puff into the lungs 2 (two) times daily.    [provider]  levalbuterol Penne Lash) 1.25 MG/3ML nebulizer solution Take 1.25 mg by nebulization every 4 (four) hours as needed for wheezing. Patient not taking: Reported on 09/12/2016 05/04/16   [name]., MD  levofloxacin (LEVAQUIN) 500 MG tablet Take 1 tablet (500 mg total) by mouth daily. Patient not taking: Reported on 09/12/2016 05/20/16   [name]., MD  lidocaine (XYLOCAINE) 2 % solution Mix 1 part 2%viscous lidocaine,1part H2O.Swish and/or swallow 59m of this mixture,378m before meals and at bedtime, up to QID Patient not taking: Reported on 09/12/2016 03/29/16   SqEppie GibsonMD  meclizine (ANTIVERT) 25 MG tablet Take 25 mg by mouth 3 (three) times daily as needed for dizziness.  06/18/13   [provider]  methylPREDNISolone (MEDROL DOSEPAK) 4 MG TBPK tablet Medrol  dose pak: Take as directed. Patient not taking: Reported on 05/20/2016 04/27/16   BaSusanne BordersNP  mometasone (ELOCON) 0.1 % cream Apply 1 application topically 2 (two) times daily as needed (for itching).    [provider]  naproxen (NAPROSYN) 500 MG tablet Take 1 tablet (500 mg total) by mouth 2 (two) times daily with a meal. Patient not taking: Reported on 09/12/2016 08/30/16   GiJerrol Banana MD  PROAIR HFA 108 ([phone]) MCG/ACT inhaler INHALE 1 OR 2 PUFFS AS NEEDED Patient not taking: Reported on 09/12/2016 03/08/16   GiJerrol Banana MD  prochlorperazine (COMPAZINE) 10 MG tablet TAKE 1 TABLET EVERY 6 HOURS AS NEEDED FOR NAUSEA AND VOMITING Patient not taking: Reported on 09/12/2016 03/30/16   MoCurt BearsMD  prochlorperazine (COMPAZINE) 10 MG tablet TAKE 1 TABLET EVERY 6 HOURS AS NEEDED FOR NAUSEA AND VOMITING Patient not taking: Reported on 09/12/2016 05/14/16   MoCurt BearsMD  promethazine-dextromethorphan (PROMETHAZINE-DM) 6.25-15 MG/5ML syrup Take 5 mLs by mouth 4 (four) times daily as needed for cough. Patient not taking: Reported on 05/20/2016 04/23/16   LaDalia HeadingPA-C  sucralfate (CARAFATE) 1 g tablet Dissolve 1 tablet in 1080m2O and swallow up to QID,PRN sore throat. Patient not taking: Reported on 04/27/2016 03/29/16   SquEppie GibsonD  SUNItinib (SUTENT) 37.5 MG capsule Take 1 capsule (37.5 mg total) by mouth daily. Patient not taking: Reported on 09/12/2016 03/29/16   MohCurt BearsD  traMADol (ULTRAM) 50 MG tablet Take 2 tablets (100 mg total) by mouth every 6 (six) hours as needed. 09/12/16   PfeCharlesetta ShanksD    Family History Family History  Problem Relation Age of Onset  . Stroke Mother   . Cardiomyopathy Mother   . Hypertension Mother   . Hypertension Father   . Congestive Heart Failure Father   . Heart disease Brother   . Heart attack Brother   . Hypertension Brother   . Diabetes Brother   . Obesity Brother   . Heart  disease Brother   . Sleep apnea Son     Social History Social History  Substance Use Topics  . Smoking status: Former Smoker    Packs/day: 1.50    Years: 35.00    Types: Cigarettes    Quit date: 12/24/2001  . Smokeless tobacco: Never Used  . Alcohol use No     Allergies   Amoxicillin; Codeine; Penicillins; and Ace inhibitors   Review of Systems Review of Systems 10 Systems reviewed and are negative  for acute change except as noted in the HPI.   Physical Exam Updated Vital Signs BP 98/64   Pulse 89   Resp 18   Ht 6' (1.829 m)   Wt 180 lb (81.6 kg) Comment: less than a month ago  SpO2 97%   BMI 24.41 kg/m   Physical Exam  Constitutional: He is oriented to person, place, and time.  Patient is alert and oriented. No respiratory distress. He is expressing severe pain due to the arm. Color is good.  HENT:  Head: Normocephalic and atraumatic.  Nose: Nose normal.  Mouth/Throat: Oropharynx is clear and moist.  Eyes: EOM are normal. Pupils are equal, round, and reactive to light.  Neck:  No bony point tenderness of the C-spine to palpation.  Cardiovascular: Normal rate, regular rhythm, normal heart sounds and intact distal pulses.   Pulmonary/Chest: Effort normal and breath sounds normal. He exhibits no tenderness.  Abdominal: Soft. He exhibits no distension. There is no tenderness. There is no guarding.  Musculoskeletal:  Patient's right upper extremity is significantly angulated at the mid humerus. Skin is intact without any open fracture. Forearm is normal in appearance with normal radial pulse and movement of the hand. Bilateral lower extremities are nontender. Patient can go through range of motion with flexion and extension at the knee hip and ankle without pain. He follows commands appropriately to go through these maneuvers.  Neurological: He is alert and oriented to person, place, and time. No cranial nerve deficit. He exhibits normal muscle tone. Coordination  normal.  Skin: Skin is warm and dry.     ED Treatments / Results  Labs (all labs ordered are listed, but only abnormal results are displayed) Labs Reviewed  BASIC METABOLIC PANEL - Abnormal; Notable for the following:       Result Value   CO2 20 (*)    Glucose, Bld 107 (*)    BUN 30 (*)    Creatinine, Ser 2.08 (*)    GFR calc non Af Amer 30 (*)    GFR calc Af Amer 35 (*)    All other components within normal limits  CBC WITH DIFFERENTIAL/PLATELET - Abnormal; Notable for the following:    WBC 11.0 (*)    RBC 2.96 (*)    Hemoglobin 8.3 (*)    HCT 26.4 (*)    Neutro Abs 8.7 (*)    All other components within normal limits  CBG MONITORING, ED - Abnormal; Notable for the following:    Glucose-Capillary 102 (*)    All other components within normal limits  CBG MONITORING, ED - Abnormal; Notable for the following:    Glucose-Capillary 128 (*)    All other components within normal limits  PROTIME-INR    EKG  EKG Interpretation  Date/Time:  Sunday Sep 12 2016 17:27:55 EDT Ventricular Rate:  80 PR Interval:    QRS Duration: 107 QT Interval:  411 QTC Calculation: 475 R Axis:   69 Text Interpretation:  Sinus rhythm Minimal ST depression, diffuse leads Confirmed by [name] [phone]) on 09/12/2016 11:43:50 PM       Radiology Dg Shoulder Right  Result Date: 09/12/2016 CLINICAL DATA:  Status post fall, with right arm pain and deformity. Initial encounter. EXAM: RIGHT SHOULDER - 2+ VIEW COMPARISON:  CT of the chest performed 04/23/2016 FINDINGS: There is a displaced fracture through the midshaft of the right humerus. The appearance raises suspicion for an underlying moth-eaten lytic lesion, raising suspicion for underlying malignancy. MRI of the right  humerus would be helpful for further evaluation. Nearly 1 shaft medial and anterior width displacement is noted, with surrounding soft tissue swelling. The right humeral head remains seated at the glenoid fossa. The elbow joint  is grossly unremarkable. IMPRESSION: Displaced fracture through the midshaft of the right humerus. Suspect underlying moth-eaten lytic lesion, concerning for underlying malignancy. MRI of the right humerus would be helpful for further evaluation. Electronically Signed   By: Garald Balding M.D.   On: 09/12/2016 19:29   Dg Elbow Complete Right  Result Date: 09/12/2016 CLINICAL DATA:  Status post fall, with right elbow pain. Initial encounter. EXAM: RIGHT ELBOW - COMPLETE 3+ VIEW COMPARISON:  None. FINDINGS: There is a displaced fracture through the midshaft of the right humerus. The appearance is concerning for an underlying moth-eaten lytic lesion, raising suspicion for underlying malignancy. MRI of the right humerus would be helpful for further evaluation. Nearly 1 shaft width displacement is noted, with surrounding soft tissue swelling. The elbow joint is grossly unremarkable. IMPRESSION: Displaced fracture through the midshaft of the right humerus. Suspect underlying moth-eaten lytic lesion, raising concern for underlying malignancy. MRI of the right humerus would be helpful for further evaluation. Electronically Signed   By: Garald Balding M.D.   On: 09/12/2016 19:27   Dg Humerus Right  Result Date: 09/12/2016 CLINICAL DATA:  Status post fall, with right arm pain. Initial encounter. EXAM: RIGHT HUMERUS - 2+ VIEW COMPARISON:  None. FINDINGS: There is a displaced fracture through the midshaft of the right humerus. The appearance raises suspicion for an underlying moth-eaten lytic lesion, raising suspicion for underlying malignancy. MRI of the right humerus would be helpful for further evaluation. Nearly 1 shaft width medial and anterior displacement is noted, with surrounding soft tissue swelling. The right humeral head remains seated at the glenoid fossa. Mild degenerative change is noted about the acromion. The elbow joint is grossly unremarkable. IMPRESSION: Displaced fracture through the midshaft of the  right humerus. There is question of an underlying moth-eaten lytic lesion, raising suspicion for underlying malignancy. MRI of the right humerus would be helpful for further evaluation, when and as deemed clinically appropriate. Electronically Signed   By: Garald Balding M.D.   On: 09/12/2016 19:26    Procedures Reduction of fracture Date/Time: 09/12/2016 10:16 PM Performed by: [name] Authorized by: [name]  Consent: Verbal consent obtained. Written consent obtained. Risks and benefits: risks, benefits and alternatives were discussed Consent given by: patient Patient understanding: patient states understanding of the procedure being performed Patient consent: the patient's understanding of the procedure matches consent given Procedure consent: procedure consent matches procedure scheduled Relevant documents: relevant documents present and verified Test results: test results available and properly labeled Site marked: the operative site was marked Imaging studies: imaging studies available Patient identity confirmed: verbally with patient  Sedation: Patient sedated: yes Sedation type: moderate (conscious) sedation Sedatives: etomidate Sedation start date/time: 09/12/2016 8:30 PM Sedation end date/time: 09/12/2016 9:00 PM Vitals: Vital signs were monitored during sedation. Patient tolerance: Patient tolerated the procedure well with no immediate complications Comments: Conscious sedation performed by myself with etomidate. Fracture reduced by direct traction and splinting done with orthopedic tech at bedside. I reviewed postreduction x-rays and alignment is adequate. No other fracture or dislocation of the shoulder or elbow by examination of x-rays. I have done post splint assessment patient is neurovascularly intact and much more comfortable in the splint.    (including critical care time)  Medications Ordered in ED Medications  etomidate (AMIDATE)  injection 12.24 mg  (10 mg Intravenous Given 09/12/16 1806)  fentaNYL (SUBLIMAZE) 100 MCG/2ML injection (50 mcg  Given 09/12/16 1815)  fentaNYL (SUBLIMAZE) injection (50 mcg Intravenous Given 09/12/16 1904)  0.9 %  sodium chloride infusion (1,000 mLs Intravenous New Bag/Given 09/12/16 1904)  ondansetron (ZOFRAN) injection ( Intravenous Canceled Entry 09/12/16 1915)  traMADol (ULTRAM) tablet 100 mg (100 mg Oral Given 09/12/16 2227)  acetaminophen (TYLENOL) tablet 1,000 mg (1,000 mg Oral Given 09/12/16 2227)     Initial Impression / Assessment and Plan / ED Course  I have reviewed the triage vital signs and the nursing notes.  Pertinent labs & imaging results that were available during my care of the patient were reviewed by me and considered in my medical decision making (see chart for details).    Multiple rechecks undertaken. Patient was significantly more comfortable once reduced and splinted. He was given fentanyl for pain which she tolerated without evidence of allergic reaction. Patient describes severe reaction in the past to codeine.  Consult: Review Dr. Gershon Mussel. He agrees to splinting and advises to have the patient follow-up as an outpatient within the next 2-3 days with either Dr. Apolonio Schneiders or Dr. Amedeo Plenty Final Clinical Impressions(s) / ED Diagnoses   Final diagnoses:  Closed displaced transverse fracture of shaft of right humerus, initial encounter  Metastatic cancer [facility])  Fall, initial encounter   Once patient was splinted pain control was much more feasible. He had fentanyl which he tolerated without adverse reaction. Subsequently we were able to administer oral tramadol and acetaminophen without any evident adverse reaction. He describes severe adverse reaction to codeine. As the patient tolerated the synthetic opioid of fentanyl, I opted to use tramadol and acetaminophen. Patient has been able to sit at bedside now and splinted. He has eaten and has family member at bedside to assist. Also has in-home  additional care. Patient will be discharged with plan for close follow-up with orthopedics.  New Prescriptions New Prescriptions   ACETAMINOPHEN (TYLENOL) 500 MG TABLET    Take 2 tablets (1,000 mg total) by mouth every 6 (six) hours as needed.   TRAMADOL (ULTRAM) 50 MG TABLET    Take 2 tablets (100 mg total) by mouth every 6 (six) hours as needed.     [name], MD 09/12/16 2356

## 2016-09-12 NOTE — ED Triage Notes (Addendum)
Report from [facility]> Pt from home.  States he tripped over walker and fell to floor.  Denies LOC.  States he hit back of head on armrest of couch and R arm on end table.  No obvious head injury.  Obvious angulated injury to R upper arm.  Total of Fentanyl 200 mcg given pta by EMS.  CMS intact.

## 2016-09-14 ENCOUNTER — Telehealth: Payer: Self-pay

## 2016-09-14 NOTE — Telephone Encounter (Signed)
Wife advised.-aa

## 2016-09-14 NOTE — Telephone Encounter (Signed)
Dr Sabra Heck or new ortho in his office.

## 2016-09-14 NOTE — Telephone Encounter (Signed)
Please review-aa 

## 2016-09-14 NOTE — Telephone Encounter (Signed)
Patient was seen at [facility] ER due to a fall and was treated for a right humerus fracture. Patient was advised to follow up with orthopedic. Patient is requesting a recommendation for ortho in town. CB# [phone]. sd

## 2016-09-14 NOTE — Telephone Encounter (Signed)
S[poke with [facility] hospice nurse, patient has been hypotensive since coming home from hospital from fracture and b/p has been 90s/50-40s, patient has not been taking Diltiazem yesterday and today and after speaking with Dr Enrique Sack advised patient needs to stop Diltiazem all together and follow b/p. Also Claiborne Billings needs an ok to refill Temazepam 15 mg at bedtime that was written a while back by hospice doctor and doctor is not available. Per Dr Rosanna Randy that was ok to do.-aa

## 2016-10-01 ENCOUNTER — Inpatient Hospital Stay (HOSPITAL_COMMUNITY): Payer: Medicare Other | Admitting: Anesthesiology

## 2016-10-01 ENCOUNTER — Inpatient Hospital Stay (HOSPITAL_COMMUNITY)
Admission: RE | Admit: 2016-10-01 | Discharge: 2016-10-03 | DRG: 493 | Disposition: A | Discharge status: Hospice | Payer: Medicare Other | Source: Ambulatory Visit | Attending: Orthopedic Surgery | Admitting: Orthopedic Surgery

## 2016-10-01 ENCOUNTER — Encounter (HOSPITAL_COMMUNITY)
Admission: RE | Disposition: A | Discharge status: Hospice | Payer: Self-pay | Source: Ambulatory Visit | Attending: Orthopedic Surgery

## 2016-10-01 ENCOUNTER — Inpatient Hospital Stay (HOSPITAL_COMMUNITY): Payer: Medicare Other

## 2016-10-01 ENCOUNTER — Observation Stay (HOSPITAL_COMMUNITY): Payer: Medicare Other

## 2016-10-01 ENCOUNTER — Encounter (HOSPITAL_COMMUNITY): Payer: Self-pay | Admitting: Anesthesiology

## 2016-10-01 ENCOUNTER — Telehealth: Payer: Self-pay | Admitting: Family Medicine

## 2016-10-01 DIAGNOSIS — W010XXA Fall on same level from slipping, tripping and stumbling without subsequent striking against object, initial encounter: Secondary | ICD-10-CM | POA: Diagnosis present

## 2016-10-01 DIAGNOSIS — C7802 Secondary malignant neoplasm of left lung: Secondary | ICD-10-CM | POA: Diagnosis present

## 2016-10-01 DIAGNOSIS — Y92009 Unspecified place in unspecified non-institutional (private) residence as the place of occurrence of the external cause: Secondary | ICD-10-CM

## 2016-10-01 DIAGNOSIS — K5900 Constipation, unspecified: Secondary | ICD-10-CM | POA: Diagnosis not present

## 2016-10-01 DIAGNOSIS — R0682 Tachypnea, not elsewhere classified: Secondary | ICD-10-CM

## 2016-10-01 DIAGNOSIS — J449 Chronic obstructive pulmonary disease, unspecified: Secondary | ICD-10-CM | POA: Diagnosis present

## 2016-10-01 DIAGNOSIS — E785 Hyperlipidemia, unspecified: Secondary | ICD-10-CM | POA: Diagnosis present

## 2016-10-01 DIAGNOSIS — K529 Noninfective gastroenteritis and colitis, unspecified: Secondary | ICD-10-CM

## 2016-10-01 DIAGNOSIS — D62 Acute posthemorrhagic anemia: Secondary | ICD-10-CM | POA: Diagnosis present

## 2016-10-01 DIAGNOSIS — C649 Malignant neoplasm of unspecified kidney, except renal pelvis: Secondary | ICD-10-CM | POA: Diagnosis present

## 2016-10-01 DIAGNOSIS — S42309A Unspecified fracture of shaft of humerus, unspecified arm, initial encounter for closed fracture: Secondary | ICD-10-CM

## 2016-10-01 DIAGNOSIS — T148XXA Other injury of unspecified body region, initial encounter: Secondary | ICD-10-CM

## 2016-10-01 DIAGNOSIS — I129 Hypertensive chronic kidney disease with stage 1 through stage 4 chronic kidney disease, or unspecified chronic kidney disease: Secondary | ICD-10-CM | POA: Diagnosis present

## 2016-10-01 DIAGNOSIS — E039 Hypothyroidism, unspecified: Secondary | ICD-10-CM | POA: Diagnosis not present

## 2016-10-01 DIAGNOSIS — G4700 Insomnia, unspecified: Secondary | ICD-10-CM | POA: Diagnosis not present

## 2016-10-01 DIAGNOSIS — F419 Anxiety disorder, unspecified: Secondary | ICD-10-CM | POA: Diagnosis present

## 2016-10-01 DIAGNOSIS — Z419 Encounter for procedure for purposes other than remedying health state, unspecified: Secondary | ICD-10-CM

## 2016-10-01 DIAGNOSIS — R112 Nausea with vomiting, unspecified: Secondary | ICD-10-CM | POA: Diagnosis not present

## 2016-10-01 DIAGNOSIS — Z833 Family history of diabetes mellitus: Secondary | ICD-10-CM

## 2016-10-01 DIAGNOSIS — C7931 Secondary malignant neoplasm of brain: Secondary | ICD-10-CM | POA: Diagnosis present

## 2016-10-01 DIAGNOSIS — N183 Chronic kidney disease, stage 3 (moderate): Secondary | ICD-10-CM | POA: Diagnosis present

## 2016-10-01 DIAGNOSIS — Z951 Presence of aortocoronary bypass graft: Secondary | ICD-10-CM

## 2016-10-01 DIAGNOSIS — I10 Essential (primary) hypertension: Secondary | ICD-10-CM | POA: Diagnosis not present

## 2016-10-01 DIAGNOSIS — Z923 Personal history of irradiation: Secondary | ICD-10-CM

## 2016-10-01 DIAGNOSIS — Z79899 Other long term (current) drug therapy: Secondary | ICD-10-CM

## 2016-10-01 DIAGNOSIS — J40 Bronchitis, not specified as acute or chronic: Secondary | ICD-10-CM

## 2016-10-01 DIAGNOSIS — I2510 Atherosclerotic heart disease of native coronary artery without angina pectoris: Secondary | ICD-10-CM | POA: Diagnosis present

## 2016-10-01 DIAGNOSIS — Z7951 Long term (current) use of inhaled steroids: Secondary | ICD-10-CM

## 2016-10-01 DIAGNOSIS — R339 Retention of urine, unspecified: Secondary | ICD-10-CM | POA: Diagnosis not present

## 2016-10-01 DIAGNOSIS — Z7982 Long term (current) use of aspirin: Secondary | ICD-10-CM

## 2016-10-01 DIAGNOSIS — Z823 Family history of stroke: Secondary | ICD-10-CM

## 2016-10-01 DIAGNOSIS — S42301A Unspecified fracture of shaft of humerus, right arm, initial encounter for closed fracture: Principal | ICD-10-CM | POA: Diagnosis present

## 2016-10-01 DIAGNOSIS — Z8249 Family history of ischemic heart disease and other diseases of the circulatory system: Secondary | ICD-10-CM

## 2016-10-01 DIAGNOSIS — Z9221 Personal history of antineoplastic chemotherapy: Secondary | ICD-10-CM

## 2016-10-01 DIAGNOSIS — Z87891 Personal history of nicotine dependence: Secondary | ICD-10-CM

## 2016-10-01 DIAGNOSIS — C7801 Secondary malignant neoplasm of right lung: Secondary | ICD-10-CM | POA: Diagnosis present

## 2016-10-01 DIAGNOSIS — E119 Type 2 diabetes mellitus without complications: Secondary | ICD-10-CM

## 2016-10-01 DIAGNOSIS — C7951 Secondary malignant neoplasm of bone: Secondary | ICD-10-CM | POA: Diagnosis present

## 2016-10-01 DIAGNOSIS — M84421A Pathological fracture, right humerus, initial encounter for fracture: Secondary | ICD-10-CM | POA: Diagnosis present

## 2016-10-01 DIAGNOSIS — K219 Gastro-esophageal reflux disease without esophagitis: Secondary | ICD-10-CM | POA: Diagnosis present

## 2016-10-01 DIAGNOSIS — F329 Major depressive disorder, single episode, unspecified: Secondary | ICD-10-CM | POA: Diagnosis present

## 2016-10-01 DIAGNOSIS — E1122 Type 2 diabetes mellitus with diabetic chronic kidney disease: Secondary | ICD-10-CM | POA: Diagnosis present

## 2016-10-01 DIAGNOSIS — Z01818 Encounter for other preprocedural examination: Secondary | ICD-10-CM

## 2016-10-01 DIAGNOSIS — K567 Ileus, unspecified: Secondary | ICD-10-CM

## 2016-10-01 DIAGNOSIS — Z905 Acquired absence of kidney: Secondary | ICD-10-CM

## 2016-10-01 HISTORY — PX: HUMERUS IM NAIL: SHX1769

## 2016-10-01 LAB — CBC WITH DIFFERENTIAL/PLATELET
BASOS ABS: 0 10*3/uL (ref 0.0–0.1)
Basophils Relative: 0 %
EOS ABS: 0.4 10*3/uL (ref 0.0–0.7)
EOS PCT: 4 %
HCT: 30 % — ABNORMAL LOW (ref 39.0–52.0)
Hemoglobin: 9.1 g/dL — ABNORMAL LOW (ref 13.0–17.0)
LYMPHS PCT: 14 %
Lymphs Abs: 1.4 10*3/uL (ref 0.7–4.0)
MCH: 26.8 pg (ref 26.0–34.0)
MCHC: 30.3 g/dL (ref 30.0–36.0)
MCV: 88.2 fL (ref 78.0–100.0)
Monocytes Absolute: 0.7 10*3/uL (ref 0.1–1.0)
Monocytes Relative: 8 %
NEUTROS PCT: 74 %
Neutro Abs: 7.1 10*3/uL (ref 1.7–7.7)
PLATELETS: 456 10*3/uL — AB (ref 150–400)
RBC: 3.4 MIL/uL — AB (ref 4.22–5.81)
RDW: 15.1 % (ref 11.5–15.5)
WBC: 9.7 10*3/uL (ref 4.0–10.5)

## 2016-10-01 LAB — GLUCOSE, CAPILLARY
GLUCOSE-CAPILLARY: 88 mg/dL (ref 65–99)
Glucose-Capillary: 111 mg/dL — ABNORMAL HIGH (ref 65–99)

## 2016-10-01 LAB — COMPREHENSIVE METABOLIC PANEL
ALT: 9 U/L — AB (ref 17–63)
AST: 20 U/L (ref 15–41)
Albumin: 3.3 g/dL — ABNORMAL LOW (ref 3.5–5.0)
Alkaline Phosphatase: 116 U/L (ref 38–126)
Anion gap: 13 (ref 5–15)
BUN: 24 mg/dL — AB (ref 6–20)
CHLORIDE: 99 mmol/L — AB (ref 101–111)
CO2: 22 mmol/L (ref 22–32)
CREATININE: 1.66 mg/dL — AB (ref 0.61–1.24)
Calcium: 10.2 mg/dL (ref 8.9–10.3)
GFR calc Af Amer: 46 mL/min — ABNORMAL LOW (ref >60)
GFR calc non Af Amer: 40 mL/min — ABNORMAL LOW (ref >60)
Glucose, Bld: 104 mg/dL — ABNORMAL HIGH (ref 65–99)
Potassium: 5 mmol/L (ref 3.5–5.1)
SODIUM: 134 mmol/L — AB (ref 135–145)
Total Bilirubin: 0.5 mg/dL (ref 0.3–1.2)
Total Protein: 7.1 g/dL (ref 6.5–8.1)

## 2016-10-01 LAB — ABO/RH: ABO/RH(D): O NEG

## 2016-10-01 LAB — URINALYSIS, ROUTINE W REFLEX MICROSCOPIC
Bilirubin Urine: NEGATIVE
Glucose, UA: NEGATIVE mg/dL
Hgb urine dipstick: NEGATIVE
Ketones, ur: 5 mg/dL — AB
LEUKOCYTES UA: NEGATIVE
NITRITE: NEGATIVE
PH: 7 (ref 5.0–8.0)
Protein, ur: NEGATIVE mg/dL
SPECIFIC GRAVITY, URINE: 1.013 (ref 1.005–1.030)

## 2016-10-01 LAB — APTT: aPTT: 35 seconds (ref 24–36)

## 2016-10-01 LAB — PREPARE RBC (CROSSMATCH)

## 2016-10-01 LAB — PROTIME-INR
INR: 1.03
Prothrombin Time: 13.5 seconds (ref 11.4–15.2)

## 2016-10-01 SURGERY — INSERTION, INTRAMEDULLARY ROD, HUMERUS
Anesthesia: General | Laterality: Right

## 2016-10-01 MED ORDER — SODIUM CHLORIDE 0.9 % IV SOLN
Freq: Once | INTRAVENOUS | Status: AC
  Administered 2016-10-01: 14:00:00 via INTRAVENOUS

## 2016-10-01 MED ORDER — POTASSIUM CHLORIDE IN NACL 20-0.9 MEQ/L-% IV SOLN
INTRAVENOUS | Status: DC
  Administered 2016-10-01 – 2016-10-02 (×2): via INTRAVENOUS
  Filled 2016-10-01 (×3): qty 1000

## 2016-10-01 MED ORDER — HYDROMORPHONE HCL 1 MG/ML IJ SOLN
INTRAMUSCULAR | Status: AC
  Filled 2016-10-01: qty 0.5

## 2016-10-01 MED ORDER — MIDAZOLAM HCL 2 MG/2ML IJ SOLN
INTRAMUSCULAR | Status: AC
  Filled 2016-10-01: qty 2

## 2016-10-01 MED ORDER — PROPOFOL 10 MG/ML IV BOLUS
INTRAVENOUS | Status: DC | PRN
  Administered 2016-10-01: 100 mg via INTRAVENOUS

## 2016-10-01 MED ORDER — OXYCODONE HCL 5 MG PO TABS
ORAL_TABLET | ORAL | Status: AC
  Filled 2016-10-01: qty 2

## 2016-10-01 MED ORDER — VITAMIN D 1000 UNITS PO TABS
1000.0000 [IU] | ORAL_TABLET | Freq: Every day | ORAL | Status: DC
Start: 2016-10-01 — End: 2016-10-03
  Administered 2016-10-01 – 2016-10-02 (×2): 1000 [IU] via ORAL
  Filled 2016-10-01 (×2): qty 1

## 2016-10-01 MED ORDER — OXYCODONE HCL 5 MG PO TABS: 5.0000 mg | ORAL_TABLET | ORAL | Status: DC | PRN

## 2016-10-01 MED ORDER — ONDANSETRON HCL 4 MG/2ML IJ SOLN
INTRAMUSCULAR | Status: DC | PRN
  Administered 2016-10-01: 4 mg via INTRAVENOUS

## 2016-10-01 MED ORDER — FENTANYL CITRATE (PF) 100 MCG/2ML IJ SOLN
INTRAMUSCULAR | Status: AC
  Filled 2016-10-01: qty 2

## 2016-10-01 MED ORDER — 0.9 % SODIUM CHLORIDE (POUR BTL) OPTIME
TOPICAL | Status: DC | PRN
  Administered 2016-10-01: 1000 mL

## 2016-10-01 MED ORDER — TRAMADOL HCL 50 MG PO TABS: 100.0000 mg | ORAL_TABLET | Freq: Four times a day (QID) | ORAL | Status: DC | PRN

## 2016-10-01 MED ORDER — METOCLOPRAMIDE HCL 5 MG PO TABS: 5.0000 mg | ORAL_TABLET | Freq: Three times a day (TID) | ORAL | Status: DC | PRN

## 2016-10-01 MED ORDER — PHENYLEPHRINE HCL 10 MG/ML IJ SOLN
INTRAMUSCULAR | Status: DC | PRN
  Administered 2016-10-01 (×3): 80 ug via INTRAVENOUS

## 2016-10-01 MED ORDER — MEPERIDINE HCL 25 MG/ML IJ SOLN: 6.2500 mg | INTRAMUSCULAR | Status: DC | PRN

## 2016-10-01 MED ORDER — ALBUMIN HUMAN 5 % IV SOLN
INTRAVENOUS | Status: DC | PRN
  Administered 2016-10-01: 13:00:00 via INTRAVENOUS

## 2016-10-01 MED ORDER — CLINDAMYCIN PHOSPHATE 600 MG/50ML IV SOLN
600.0000 mg | Freq: Four times a day (QID) | INTRAVENOUS | Status: AC
  Administered 2016-10-01 – 2016-10-02 (×3): 600 mg via INTRAVENOUS
  Filled 2016-10-01 (×3): qty 50

## 2016-10-01 MED ORDER — SENNOSIDES-DOCUSATE SODIUM 8.6-50 MG PO TABS
1.0000 | ORAL_TABLET | Freq: Two times a day (BID) | ORAL | Status: DC
  Administered 2016-10-01 – 2016-10-02 (×2): 1 via ORAL
  Filled 2016-10-01 (×2): qty 1

## 2016-10-01 MED ORDER — METOCLOPRAMIDE HCL 5 MG/ML IJ SOLN
5.0000 mg | Freq: Three times a day (TID) | INTRAMUSCULAR | Status: DC | PRN
  Administered 2016-10-02: 10 mg via INTRAVENOUS
  Filled 2016-10-01: qty 2

## 2016-10-01 MED ORDER — ROCURONIUM BROMIDE 100 MG/10ML IV SOLN
INTRAVENOUS | Status: DC | PRN
  Administered 2016-10-01: 50 mg via INTRAVENOUS

## 2016-10-01 MED ORDER — FENTANYL CITRATE (PF) 100 MCG/2ML IJ SOLN
INTRAMUSCULAR | Status: DC | PRN
  Administered 2016-10-01 (×2): 50 ug via INTRAVENOUS
  Administered 2016-10-01: 100 ug via INTRAVENOUS

## 2016-10-01 MED ORDER — HYDROMORPHONE HCL 1 MG/ML IJ SOLN
INTRAMUSCULAR | Status: AC
  Administered 2016-10-01: 0.5 mg via INTRAVENOUS
  Filled 2016-10-01: qty 0.5

## 2016-10-01 MED ORDER — FLUOXETINE HCL 20 MG PO CAPS
40.0000 mg | ORAL_CAPSULE | Freq: Every day | ORAL | Status: DC
  Administered 2016-10-01 – 2016-10-03 (×3): 40 mg via ORAL
  Filled 2016-10-01 (×3): qty 2

## 2016-10-01 MED ORDER — ACETAMINOPHEN 650 MG RE SUPP: 650.0000 mg | Freq: Four times a day (QID) | RECTAL | Status: DC | PRN

## 2016-10-01 MED ORDER — LEVALBUTEROL HCL 1.25 MG/3ML IN NEBU
1.2500 mg | INHALATION_SOLUTION | RESPIRATORY_TRACT | Status: DC | PRN
  Filled 2016-10-01: qty 3

## 2016-10-01 MED ORDER — SODIUM CHLORIDE 0.9 % IV SOLN
INTRAVENOUS | Status: DC | PRN
  Administered 2016-10-01: 11:00:00 via INTRAVENOUS

## 2016-10-01 MED ORDER — PROMETHAZINE HCL 25 MG/ML IJ SOLN: 6.2500 mg | INTRAMUSCULAR | Status: DC | PRN

## 2016-10-01 MED ORDER — EPHEDRINE SULFATE 50 MG/ML IJ SOLN
INTRAMUSCULAR | Status: DC | PRN
  Administered 2016-10-01: 10 mg via INTRAVENOUS

## 2016-10-01 MED ORDER — LORATADINE 10 MG PO TABS
10.0000 mg | ORAL_TABLET | Freq: Every day | ORAL | Status: DC
  Administered 2016-10-01 – 2016-10-02 (×2): 10 mg via ORAL
  Filled 2016-10-01 (×2): qty 1

## 2016-10-01 MED ORDER — PHENYLEPHRINE 40 MCG/ML (10ML) SYRINGE FOR IV PUSH (FOR BLOOD PRESSURE SUPPORT)
PREFILLED_SYRINGE | INTRAVENOUS | Status: AC
  Filled 2016-10-01: qty 10

## 2016-10-01 MED ORDER — WHITE PETROLATUM GEL
Status: AC
  Administered 2016-10-01: 1
  Filled 2016-10-01: qty 1

## 2016-10-01 MED ORDER — MOMETASONE FURO-FORMOTEROL FUM 100-5 MCG/ACT IN AERO
2.0000 | INHALATION_SPRAY | Freq: Two times a day (BID) | RESPIRATORY_TRACT | Status: DC
  Administered 2016-10-01 – 2016-10-03 (×3): 2 via RESPIRATORY_TRACT
  Filled 2016-10-01: qty 8.8

## 2016-10-01 MED ORDER — LEVALBUTEROL HCL 1.25 MG/0.5ML IN NEBU
1.2500 mg | INHALATION_SOLUTION | Freq: Once | RESPIRATORY_TRACT | Status: AC
  Administered 2016-10-01: 1.25 mg via RESPIRATORY_TRACT
  Filled 2016-10-01: qty 0.5

## 2016-10-01 MED ORDER — TEMAZEPAM 15 MG PO CAPS
15.0000 mg | ORAL_CAPSULE | Freq: Every evening | ORAL | Status: DC | PRN
  Administered 2016-10-01: 15 mg via ORAL
  Filled 2016-10-01: qty 1

## 2016-10-01 MED ORDER — GLYCOPYRROLATE 0.2 MG/ML IJ SOLN
INTRAMUSCULAR | Status: DC | PRN
  Administered 2016-10-01: 0.6 mg via INTRAVENOUS

## 2016-10-01 MED ORDER — ACETAMINOPHEN 500 MG PO TABS
1000.0000 mg | ORAL_TABLET | Freq: Four times a day (QID) | ORAL | Status: DC
  Administered 2016-10-01 – 2016-10-03 (×7): 1000 mg via ORAL
  Filled 2016-10-01 (×7): qty 2

## 2016-10-01 MED ORDER — LACTATED RINGERS IV SOLN: INTRAVENOUS | Status: DC | PRN

## 2016-10-01 MED ORDER — INSULIN ASPART 100 UNIT/ML ~~LOC~~ SOLN
0.0000 [IU] | Freq: Three times a day (TID) | SUBCUTANEOUS | Status: DC
  Administered 2016-10-02 (×2): 2 [IU] via SUBCUTANEOUS
  Administered 2016-10-02: 1 [IU] via SUBCUTANEOUS

## 2016-10-01 MED ORDER — ONDANSETRON HCL 4 MG/2ML IJ SOLN
4.0000 mg | Freq: Four times a day (QID) | INTRAMUSCULAR | Status: DC | PRN
  Administered 2016-10-02 (×2): 4 mg via INTRAVENOUS
  Filled 2016-10-01 (×3): qty 2

## 2016-10-01 MED ORDER — ONDANSETRON HCL 4 MG PO TABS: 4.0000 mg | ORAL_TABLET | Freq: Four times a day (QID) | ORAL | Status: DC | PRN

## 2016-10-01 MED ORDER — ONDANSETRON HCL 4 MG/2ML IJ SOLN
INTRAMUSCULAR | Status: AC
  Filled 2016-10-01: qty 2

## 2016-10-01 MED ORDER — CALCIUM CARBONATE 1250 (500 CA) MG PO TABS
2.0000 | ORAL_TABLET | Freq: Two times a day (BID) | ORAL | Status: DC
  Administered 2016-10-02 – 2016-10-03 (×3): 1000 mg via ORAL
  Filled 2016-10-01 (×3): qty 1

## 2016-10-01 MED ORDER — CHLORHEXIDINE GLUCONATE 4 % EX LIQD: 60.0000 mL | Freq: Once | CUTANEOUS | Status: DC

## 2016-10-01 MED ORDER — NEOSTIGMINE METHYLSULFATE 5 MG/5ML IV SOSY
PREFILLED_SYRINGE | INTRAVENOUS | Status: AC
  Filled 2016-10-01: qty 5

## 2016-10-01 MED ORDER — ACETAMINOPHEN 325 MG PO TABS: 650.0000 mg | ORAL_TABLET | Freq: Four times a day (QID) | ORAL | Status: DC | PRN

## 2016-10-01 MED ORDER — NEOSTIGMINE METHYLSULFATE 10 MG/10ML IV SOLN
INTRAVENOUS | Status: DC | PRN
  Administered 2016-10-01: 3 mg via INTRAVENOUS

## 2016-10-01 MED ORDER — PROPOFOL 10 MG/ML IV BOLUS
INTRAVENOUS | Status: AC
  Filled 2016-10-01: qty 20

## 2016-10-01 MED ORDER — HYDROMORPHONE HCL 1 MG/ML IJ SOLN
0.2500 mg | INTRAMUSCULAR | Status: DC | PRN
  Administered 2016-10-01 (×5): 0.5 mg via INTRAVENOUS

## 2016-10-01 MED ORDER — CLINDAMYCIN PHOSPHATE 900 MG/50ML IV SOLN
900.0000 mg | INTRAVENOUS | Status: AC
  Administered 2016-10-01: 900 mg via INTRAVENOUS
  Filled 2016-10-01: qty 50

## 2016-10-01 MED ORDER — MORPHINE SULFATE (PF) 4 MG/ML IV SOLN: 1.0000 mg | INTRAVENOUS | Status: DC | PRN

## 2016-10-01 MED ORDER — LIDOCAINE 2% (20 MG/ML) 5 ML SYRINGE
INTRAMUSCULAR | Status: AC
  Filled 2016-10-01: qty 5

## 2016-10-01 MED ORDER — ALBUTEROL SULFATE (2.5 MG/3ML) 0.083% IN NEBU
3.0000 mL | INHALATION_SOLUTION | Freq: Four times a day (QID) | RESPIRATORY_TRACT | Status: DC | PRN
  Administered 2016-10-02: 3 mL via RESPIRATORY_TRACT
  Filled 2016-10-01: qty 3

## 2016-10-01 MED ORDER — ALPRAZOLAM 0.5 MG PO TABS
0.5000 mg | ORAL_TABLET | Freq: Two times a day (BID) | ORAL | Status: DC | PRN
  Administered 2016-10-01: 0.5 mg via ORAL
  Filled 2016-10-01: qty 1

## 2016-10-01 MED ORDER — OXYCODONE HCL 5 MG PO TABS: 5.0000 mg | ORAL_TABLET | Freq: Four times a day (QID) | ORAL | 0 refills | Status: AC | PRN

## 2016-10-01 MED ORDER — FENTANYL CITRATE (PF) 250 MCG/5ML IJ SOLN
INTRAMUSCULAR | Status: AC
  Filled 2016-10-01: qty 5

## 2016-10-01 MED ORDER — ENOXAPARIN SODIUM 40 MG/0.4ML ~~LOC~~ SOLN
40.0000 mg | SUBCUTANEOUS | Status: DC
  Administered 2016-10-02 – 2016-10-03 (×2): 40 mg via SUBCUTANEOUS
  Filled 2016-10-01 (×2): qty 0.4

## 2016-10-01 MED ORDER — PROCHLORPERAZINE MALEATE 10 MG PO TABS
10.0000 mg | ORAL_TABLET | Freq: Four times a day (QID) | ORAL | Status: DC | PRN
  Administered 2016-10-02 (×2): 10 mg via ORAL
  Filled 2016-10-01 (×4): qty 1

## 2016-10-01 MED ORDER — LIDOCAINE HCL (CARDIAC) 20 MG/ML IV SOLN
INTRAVENOUS | Status: DC | PRN
  Administered 2016-10-01: 60 mg via INTRAVENOUS

## 2016-10-01 MED ORDER — EPHEDRINE 5 MG/ML INJ
INTRAVENOUS | Status: AC
  Filled 2016-10-01: qty 10

## 2016-10-01 MED ORDER — ROCURONIUM BROMIDE 10 MG/ML (PF) SYRINGE
PREFILLED_SYRINGE | INTRAVENOUS | Status: AC
  Filled 2016-10-01: qty 5

## 2016-10-01 SURGICAL SUPPLY — 56 items
BIT DRILL 35MM (DRILL) ×2
BIT DRILL 3X200 (DRILL) ×2 IMPLANT
BRUSH SCRUB SURG 4.25 DISP (MISCELLANEOUS) ×6 IMPLANT
COVER SURGICAL LIGHT HANDLE (MISCELLANEOUS) ×6 IMPLANT
DRAPE C-ARM 42X72 X-RAY (DRAPES) ×3 IMPLANT
DRAPE C-ARMOR (DRAPES) ×3 IMPLANT
DRAPE IMP U-DRAPE 54X76 (DRAPES) ×3 IMPLANT
DRAPE INCISE IOBAN 66X45 STRL (DRAPES) ×3 IMPLANT
DRAPE U-SHAPE 47X51 STRL (DRAPES) ×3 IMPLANT
DRSG ADAPTIC 3X8 NADH LF (GAUZE/BANDAGES/DRESSINGS) ×1 IMPLANT
DRSG MEPILEX BORDER 4X4 (GAUZE/BANDAGES/DRESSINGS) ×6 IMPLANT
DRSG PAD ABDOMINAL 8X10 ST (GAUZE/BANDAGES/DRESSINGS) ×3 IMPLANT
ELECT REM PT RETURN 9FT ADLT (ELECTROSURGICAL)
ELECTRODE REM PT RTRN 9FT ADLT (ELECTROSURGICAL) IMPLANT
GAUZE SPONGE 4X4 12PLY STRL (GAUZE/BANDAGES/DRESSINGS) ×1 IMPLANT
GLOVE BIO SURGEON STRL SZ7.5 (GLOVE) ×3 IMPLANT
GLOVE BIO SURGEON STRL SZ8 (GLOVE) ×3 IMPLANT
GLOVE BIOGEL PI IND STRL 7.5 (GLOVE) ×1 IMPLANT
GLOVE BIOGEL PI IND STRL 8 (GLOVE) ×1 IMPLANT
GLOVE BIOGEL PI INDICATOR 7.5 (GLOVE) ×2
GLOVE BIOGEL PI INDICATOR 8 (GLOVE) ×2
GOWN STRL REUS W/ TWL LRG LVL3 (GOWN DISPOSABLE) ×2 IMPLANT
GOWN STRL REUS W/ TWL XL LVL3 (GOWN DISPOSABLE) ×1 IMPLANT
GOWN STRL REUS W/TWL LRG LVL3 (GOWN DISPOSABLE) ×6
GOWN STRL REUS W/TWL XL LVL3 (GOWN DISPOSABLE) ×3
KIT BASIN OR (CUSTOM PROCEDURE TRAY) ×3 IMPLANT
KIT ROOM TURNOVER OR (KITS) ×3 IMPLANT
MANIFOLD NEPTUNE II (INSTRUMENTS) ×3 IMPLANT
MARKER WIRE 150MM SINGLE USE (MARKER) ×2 IMPLANT
NAIL HUMERAL 8X270MM RIGHT DIA (Nail) ×2 IMPLANT
NS IRRIG 1000ML POUR BTL (IV SOLUTION) ×3 IMPLANT
PACK TOTAL JOINT (CUSTOM PROCEDURE TRAY) ×3 IMPLANT
PACK UNIVERSAL I (CUSTOM PROCEDURE TRAY) ×3 IMPLANT
PAD ARMBOARD 7.5X6 YLW CONV (MISCELLANEOUS) ×6 IMPLANT
SCREW DISTAL 4.3X24MM (Screw) ×2 IMPLANT
SCREW DISTAL 5X32MM (Screw) ×2 IMPLANT
SCREW DUSTAL 4.3X22MM (Screw) ×4 IMPLANT
SCREW PROXIMAL 5X36MM (Screw) ×2 IMPLANT
SCREW PROXIMAL CANN 5X40MM (Screw) ×2 IMPLANT
SLING ARM FOAM STRAP LRG (SOFTGOODS) ×2 IMPLANT
STAPLER VISISTAT 35W (STAPLE) ×5 IMPLANT
SUCTION FRAZIER HANDLE 10FR (MISCELLANEOUS) ×2
SUCTION TUBE FRAZIER 10FR DISP (MISCELLANEOUS) ×1 IMPLANT
SUT ETHILON 3 0 PS 1 (SUTURE) ×2 IMPLANT
SUT FIBERWIRE 2-0 18 17.9 3/8 (SUTURE)
SUT VIC AB 0 CT1 27 (SUTURE) ×6
SUT VIC AB 0 CT1 27XBRD ANBCTR (SUTURE) ×2 IMPLANT
SUT VIC AB 2-0 CT1 27 (SUTURE) ×9
SUT VIC AB 2-0 CT1 27XBRD (SUTURE) ×2 IMPLANT
SUT VIC AB 2-0 CT1 TAPERPNT 27 (SUTURE) IMPLANT
SUT VIC AB 2-0 CT3 27 (SUTURE) IMPLANT
SUTURE FIBERWR 2-0 18 17.9 3/8 (SUTURE) IMPLANT
TRAY FOLEY W/METER SILVER 16FR (SET/KITS/TRAYS/PACK) IMPLANT
WATER STERILE IRR 1000ML POUR (IV SOLUTION) ×3 IMPLANT
WIRE GUIDE MODEL 22X500MM (WIRE) ×2 IMPLANT
YANKAUER SUCT BULB TIP NO VENT (SUCTIONS) ×3 IMPLANT

## 2016-10-01 NOTE — Anesthesia Procedure Notes (Signed)
Procedure Name: Intubation Date/Time: 10/01/2016 12:21 PM Performed by: Scheryl Darter Pre-anesthesia Checklist: Patient identified, Emergency Drugs available, Suction available and Patient being monitored Patient Re-evaluated:Patient Re-evaluated prior to inductionOxygen Delivery Method: Circle System Utilized Preoxygenation: Pre-oxygenation with 100% oxygen Intubation Type: IV induction Ventilation: Mask ventilation without difficulty Laryngoscope Size: Mac and 3 Grade View: Grade I Tube type: Oral Tube size: 7.5 mm Number of attempts: 1 Airway Equipment and Method: Stylet and Oral airway Placement Confirmation: ETT inserted through vocal cords under direct vision,  positive ETCO2 and breath sounds checked- equal and bilateral Secured at: 22 cm Tube secured with: Tape Dental Injury: Teeth and Oropharynx as per pre-operative assessment

## 2016-10-01 NOTE — Brief Op Note (Signed)
10/01/2016  2:15 PM  PATIENT:  Patrick Breed Sr.  71 y.o. male  PRE-OPERATIVE DIAGNOSIS:  PATHOLOGICAL RIGHT HUMERAL SHAFT FRACTURE  POST-OPERATIVE DIAGNOSIS:  PATHOLOGICAL RIGHT HUMERAL SHAFT FRACTURE  PROCEDURE:  Procedure(s): INTRAMEDULLARY (IM) NAIL HUMERAL (Right) RIGHT HUMERUS WITH TORNIER 8 X 270MM statically locked  SURGEON:  Surgeon(s) and Role:    Altamese Pulaski, MD - Primary  PHYSICIAN ASSISTANT: Ainsley Spinner, [facility]  ANESTHESIA:   general  EBL:  Total I/O In: 1330 [I.V.:800; Blood:280; IV Piggyback:250] Out: -   BLOOD ADMINISTERED:none  DRAINS: none   LOCAL MEDICATIONS USED:  NONE  SPECIMEN:  No Specimen  DISPOSITION OF SPECIMEN:  N/A  COUNTS:  YES  TOURNIQUET:  * No tourniquets in log *  DICTATION: .Other Dictation: Dictation Number [id number]  PLAN OF CARE: Admit to inpatient   PATIENT DISPOSITION:  PACU - hemodynamically stable.   Delay start of Pharmacological VTE agent (>24hrs) due to surgical blood loss or risk of bleeding: no

## 2016-10-01 NOTE — Transfer of Care (Signed)
Immediate Anesthesia Transfer of Care Note  Patient: Patrick Breed Sr.  Procedure(s) Performed: Procedure(s): INTRAMEDULLARY (IM) NAIL HUMERAL (Right)  Patient Location: PACU  Anesthesia Type:General  Level of Consciousness: awake, alert , oriented and sedated  Airway & Oxygen Therapy: Patient Spontanous Breathing and Patient connected to nasal cannula oxygen  Post-op Assessment: Report given to RN, Post -op Vital signs reviewed and stable and Patient moving all extremities  Post vital signs: Reviewed and stable  Last Vitals:  Vitals:   10/01/16 1132 10/01/16 1449  BP: 111/71 123/78  Pulse: 100 (!) 107  Resp: 18 (!) 27  Temp: 36.6 C 36.6 C    Last Pain:  Vitals:   10/01/16 1449  TempSrc:   PainSc: 9       Patients Stated Pain Goal: 3 (72/82/06 0156)  Complications: No apparent anesthesia complications

## 2016-10-01 NOTE — Anesthesia Postprocedure Evaluation (Signed)
Anesthesia Post Note  Patient: Patrick Breed Sr.  Procedure(s) Performed: Procedure(s) (LRB): INTRAMEDULLARY (IM) NAIL HUMERAL (Right)     Patient location during evaluation: PACU Anesthesia Type: General Level of consciousness: awake and alert and oriented Pain management: pain level controlled Vital Signs Assessment: post-procedure vital signs reviewed and stable Respiratory status: spontaneous breathing, nonlabored ventilation, respiratory function stable and patient connected to nasal cannula oxygen Cardiovascular status: blood pressure returned to baseline and stable Postop Assessment: no signs of nausea or vomiting Anesthetic complications: no    Last Vitals:  Vitals:   10/01/16 1530 10/01/16 1545  BP:    Pulse: 94 96  Resp: (!) 28 (!) 26  Temp:      Last Pain:  Vitals:   10/01/16 1449  TempSrc:   PainSc: 9                  Kaysia Willard A.

## 2016-10-01 NOTE — Consult Note (Signed)
[facility] Consult History and Physical  [name] MCN:[id number] DOB: 1945/12/28 DOA: 10/01/2016  Referring physician: Dr. Marcelino Scot PCP: [name]., MD   Reason: medical mgmt  HPI: Patrick SKOOG Sr. is a 71 y.o. 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 Patient states that prior to the operation other than hip pain he had no other significant symptoms above what is baseline for him. No recent medication changes. Currently has no complaints.   Review of Systems:  As per HPI otherwise 10 point review of systems negative.    Past Medical History:  Diagnosis Date  . AAA (abdominal aortic aneurysm) ([place])   . Anxiety   . Asthma   . Cancer [facility])    Renal Cell- 2013; Lung 2017  . Chronic kidney disease    Renal Cell Adenocarcinoma  . Chronic kidney disease    Left Nephrectomy  . COPD (chronic obstructive pulmonary disease) ([place])   . Coronary artery disease   . Dehydration 12/16/2015  . Depression   . Diabetes mellitus without complication ([place])    Type II  . Eczema   . Encounter for antineoplastic chemotherapy 08/19/2015  . Encounter for antineoplastic immunotherapy 01/06/2016  . GERD (gastroesophageal reflux disease)   . Headache   . Heart murmur   . History of radiation therapy 03/24/16- 04/06/2016   Left Lung/ Mediastinum 30 Gy in 10 fractions  . HPTH (hyperparathyroidism) ([place])   . Hx of radiation therapy 08/20/15   Right Temporal brain  . Hyperlipidemia   . Hyperparathyroidism ([place])   . Hypertension   . Hypertension 08/19/2015  . Hypothyroidism   . Malignant neoplasm of left kidney ([place]) 02/09/2013  . Melena 03/17/2016  . Neuropathy   . Shortness of breath dyspnea   . Sleep apnea    CPAP  . Thoracic aortic aneurysm ([place]) 2017   Past Surgical History:  Procedure Laterality Date  . BACK SURGERY     compressed vertebrae-put in a plate to seprate the  spaces  . CARDIAC CATHETERIZATION    . CERVICAL FUSION    . CORONARY ARTERY BYPASS GRAFT     x 5  . ENDOBRONCHIAL ULTRASOUND N/A 06/30/2015   Procedure: ENDOHIAL ULTRASOUND;  Surgeon: Flora Lipps, MD;  Location: [facility] ORS;  Service: Cardiopulmonary;  Laterality: N/A;  . FRACTURE SURGERY     left arm repair  . KNEE SURGERY     left knee repair  . PARATHYROIDECTOMY  12/17/09  . VIDEO BRONCHOSCOPY WITH ENDOBRONCHIAL ULTRASOUND N/A 07/09/2015   Procedure: VIDEO BRONCHOSCOPY WITH ENDOBRONCHIAL ULTRASOUND;  Surgeon: Ivin Poot, MD;  Location: [place];  Service: Thoracic;  Laterality: N/A;   Social History:  reports that he quit smoking about 14 years ago. His smoking use included Cigarettes. He has a 52.50 pack-year smoking history. He has never used smokeless tobacco. He reports that he does not drink alcohol or use drugs.  Allergies  Allergen Reactions  . Ace Inhibitors Shortness Of Breath, Other (See Comments) and Cough    WHEEZING   . Amoxicillin Anaphylaxis, Rash and Other (See Comments)    Has patient had a PCN reaction causing immediate rash, facial/tongue/throat swelling, SOB or lightheadedness with hypotension: Yes Has patient had a PCN reaction causing severe rash involving mucus membranes or skin necrosis: No Has patient had a PCN reaction that required hospitalization No Has patient had a PCN reaction occurring within the last  10 years: No If all of the above answers are "NO", then may proceed with Cephalosporin use.  . Codeine Anaphylaxis, Diarrhea and Rash  . Penicillins Anaphylaxis, Rash and Other (See Comments)    Has patient had a PCN reaction causing immediate rash, facial/tongue/throat swelling, SOB or lightheadedness with hypotension: Yes Has patient had a PCN reaction causing severe rash involving mucus membranes or skin necrosis: No Has patient had a PCN reaction that required hospitalization No Has patient had a PCN reaction occurring within the last 10 years: No If  all of the above answers are "NO", then may proceed with Cephalosporin use.    Family History  Problem Relation Age of Onset  . Stroke Mother   . Cardiomyopathy Mother   . Hypertension Mother   . Hypertension Father   . Congestive Heart Failure Father   . Heart disease Brother   . Heart attack Brother   . Hypertension Brother   . Diabetes Brother   . Obesity Brother   . Heart disease Brother   . Sleep apnea Son      Prior to Admission medications   Medication Sig Start Date End Date Taking? Authorizing Provider  acetaminophen (TYLENOL) 500 MG tablet Take 2 tablets (1,000 mg total) by mouth every 6 (six) hours as needed. 09/12/16  Yes [name], MD  ALPRAZolam (XANAX) 0.5 MG tablet TAKE ONE TABLET BY MOUTH TWICE DAILY AS NEEDED Patient taking differently: TAKE ONE TABLET BY MOUTH TWICE DAILY AS NEEDED FOR ANXIETY 04/15/16  Yes [name]., MD  calcium carbonate (OSCAL) 1500 (600 Ca) MG TABS tablet Take 600 mg of elemental calcium by mouth 2 (two) times daily with a meal.   Yes [provider]  cholecalciferol (VITAMIN D) 1000 units tablet Take 1,000 Units by mouth at bedtime.   Yes [provider]  FLUoxetine (PROZAC) 40 MG capsule Take 40 mg by mouth daily.   Yes [provider]  Fluticasone-Salmeterol (ADVAIR) 100-50 MCG/DOSE AEPB Inhale 1 puff into the lungs 2 (two) times daily.   Yes [provider]  levalbuterol (XOPENEX) 1.25 MG/3ML nebulizer solution Take 1.25 mg by nebulization every 4 (four) hours as needed for wheezing. 05/04/16  Yes [name]., MD  mometasone (ELOCON) 0.1 % cream Apply 1 application topically 2 (two) times daily as needed (for itching).   Yes [provider]  PROAIR HFA 108 [phone] Base) MCG/ACT inhaler INHALE 1 OR 2 PUFFS AS NEEDED 03/08/16  Yes [name]., MD  prochlorperazine (COMPAZINE) 10 MG tablet TAKE 1 TABLET EVERY 6 HOURS AS NEEDED FOR NAUSEA AND VOMITING 03/30/16  Yes  Curt Bears, MD  senna-docusate (SENOKOT-S) 8.6-50 MG tablet Take 1 tablet by mouth 2 (two) times daily.   Yes [provider]  temazepam (RESTORIL) 15 MG capsule Take 15 mg by mouth at bedtime as needed for sleep.   Yes [provider]  traMADol (ULTRAM) 50 MG tablet Take 2 tablets (100 mg total) by mouth every 6 (six) hours as needed. 09/12/16  Yes [name], MD  loratadine (CLARITIN) 10 MG tablet Take 1 tablet (10 mg total) by mouth daily. 06/10/16   [name]., MD  oxyCODONE (OXY IR/ROXICODONE) 5 MG immediate release tablet Take 1-2 tablets (5-10 mg total) by mouth every 6 (six) hours as needed for severe pain or breakthrough pain. 10/01/16   Ainsley Spinner, PA-C  promethazine-dextromethorphan (PROMETHAZINE-DM) 6.25-15 MG/5ML syrup Take 5 mLs by mouth 4 (four) times daily  as needed for cough. Patient not taking: Reported on 05/20/2016 04/23/16   Dalia Heading, PA-C   Physical Exam: Vitals:   10/01/16 1800 10/01/16 1804 10/01/16 1805 10/01/16 1807  BP:  (!) 98/58  (!) 98/54  Pulse: (!) 101   100  Resp: 13  12 14   Temp:   98.9 F (37.2 C) 97.7 F (36.5 C)  TempSrc:    Oral  SpO2: 95%   94%  Weight:      Height:        Wt Readings from Last 3 Encounters:  10/01/16 81.6 kg (180 lb)  09/12/16 81.6 kg (180 lb)  05/20/16 93.9 kg (207 lb)    General:  Appears calm and comfortable, A&Ox3 Eyes:  PERRL, EOMI, normal lids, iris ENT:  grossly normal hearing, lips & tongue Neck:  no LAD, masses or thyromegaly Cardiovascular:  RRR, no m/r/g. No LE edema.  Respiratory:  CTA bilaterally, no w/r/r. Tachypnea. Abdomen:  soft, ntnd Skin:  no rash or induration seen on limited exam Musculoskeletal:  grossly normal tone BUE/BLE Psychiatric:  grossly normal mood and affect, speech fluent and appropriate Neurologic:  CN 2-12 grossly intact, moves all extremities in coordinated fashion.          Labs on Admission:  Basic Metabolic Panel:  Recent  Labs Lab 10/01/16 1129  NA 134*  K 5.0  CL 99*  CO2 22  GLUCOSE 104*  BUN 24*  CREATININE 1.66*  CALCIUM 10.2   Liver Function Tests:  Recent Labs Lab 10/01/16 1129  AST 20  ALT 9*  ALKPHOS 116  BILITOT 0.5  PROT 7.1  ALBUMIN 3.3*   No results for input(s): LIPASE, AMYLASE in the last 168 hours. No results for input(s): AMMONIA in the last 168 hours. CBC:  Recent Labs Lab 10/01/16 1129  WBC 9.7  NEUTROABS 7.1  HGB 9.1*  HCT 30.0*  MCV 88.2  PLT 456*   Cardiac Enzymes: No results for input(s): CKTOTAL, CKMB, CKMBINDEX, TROPONINI in the last 168 hours.  BNP (last 3 results) No results for input(s): BNP in the last 8760 hours.  ProBNP (last 3 results) No results for input(s): PROBNP in the last 8760 hours.   Serum creatinine: 1.66 mg/dL (H) 10/01/16 1129 Estimated creatinine clearance: 44.8 mL/min (A)  CBG:  Recent Labs Lab 10/01/16 1136 10/01/16 1513  GLUCAP 88 111*    Radiological Exams on Admission: Dg Humerus Right  Result Date: 10/01/2016 CLINICAL DATA:  Intramedullary nail placement in the right humeral fracture. EXAM: RIGHT HUMERUS - 2+ VIEW COMPARISON:  09/12/2016 FINDINGS: Intramedullary nail has been placed in the right humerus. There are 3 proximal interlocking screws. Three distal interlocking screws. There is a fracture involving the mid humerus with adjacent callus formation. Small fracture fragments are present. There is anatomic alignment of the humerus. IMPRESSION: Internal fixation of the mid right humeral fracture. Electronically Signed   By: Markus Daft M.D.   On: 10/01/2016 17:01   Dg Humerus Right  Result Date: 10/01/2016 CLINICAL DATA:  Hardware fixation of a probable pathological right humerus fracture. EXAM: RIGHT HUMERUS - 2+ VIEW; DG C-ARM 61-120 MIN COMPARISON:  Radiographs dated 09/12/2016. FINDINGS: 7 C-arm views of the right humerus demonstrate interval intramedullary rod and screw fixation of the previously demonstrated  probable pathological fracture of the mid right humerus. Again demonstrated is a lytic lesion at the fracture site with interval partial bridging callus formation or mass. Essentially anatomic position and alignment of the main fracture fragments. IMPRESSION:  Hardware fixation of the previously demonstrated probable pathological fracture of the mid right humerus with anatomic position and alignment. Electronically Signed   By: Claudie Revering M.D.   On: 10/01/2016 14:41   Dg C-arm 1-60 Min  Result Date: 10/01/2016 CLINICAL DATA:  Hardware fixation of a probable pathological right humerus fracture. EXAM: RIGHT HUMERUS - 2+ VIEW; DG C-ARM 61-120 MIN COMPARISON:  Radiographs dated 09/12/2016. FINDINGS: 7 C-arm views of the right humerus demonstrate interval intramedullary rod and screw fixation of the previously demonstrated probable pathological fracture of the mid right humerus. Again demonstrated is a lytic lesion at the fracture site with interval partial bridging callus formation or mass. Essentially anatomic position and alignment of the main fracture fragments. IMPRESSION: Hardware fixation of the previously demonstrated probable pathological fracture of the mid right humerus with anatomic position and alignment. Electronically Signed   By: Claudie Revering M.D.   On: 10/01/2016 14:41    EKG: n/a  Assessment/Plan Principal Problem:   Pathologic fracture of right humerus Active Problems:   COPD, mild (HCC)   Diabetes mellitus, type 2 (HCC)   Essential (primary) hypertension   HLD (hyperlipidemia)   Adult hypothyroidism   Acid reflux   Humerus fracture Management per orthopedic surgery Cont tramadol & morphine for pain  Tachypnea/COPD Checking chest x-ray Albuterol when necessary for shortness of breath  DM  SSI ACHS A1c pending  Hypertension When necessary hydralazine 10 mg IV as needed for severe blood pressure  Hyperlipidemia Continue statin  Hypothyroidism hx Check  TSH  Depression Con prozac  Anxiety Cont xanax Patient currently has clear sensorium but is on a number of narcotics and history blockers if he starts to have changes the should be held   DVT Prophylaxis: lovenox   Elwin Mocha, MD Family Medicine [facility] [email or web address] Password [email or web address]

## 2016-10-01 NOTE — Anesthesia Preprocedure Evaluation (Addendum)
Anesthesia Evaluation    Airway Mallampati: II  TM Distance: >3 FB Neck ROM: Full    Dental  (+) Lower Dentures, Upper Dentures   Pulmonary shortness of breath and with exertion, asthma , sleep apnea and Continuous Positive Airway Pressure Ventilation , COPD,  COPD inhaler, former smoker,  Pulmonary metastatic disease   breath sounds clear to auscultation + decreased breath sounds      Cardiovascular hypertension, Pt. on medications + CAD, + CABG and + Peripheral Vascular Disease  Normal cardiovascular exam+ Valvular Problems/Murmurs  Rhythm:Regular Rate:Normal  Hx/o Thoracic aortic aneurysm and AAA CABG x 5   Neuro/Psych  Headaches, PSYCHIATRIC DISORDERS Anxiety Depression Neuropathy Brain mets    GI/Hepatic GERD  Medicated and Controlled,  Endo/Other  diabetes, Well Controlled, Type 2Hypothyroidism Hyperparathyroidism Hyperlipidemia  Renal/GU ARFRenal diseaseRenal Cell adenocarcinoma with metastasis  negative genitourinary   Musculoskeletal Fx right humeral shaft - metastatic lesion   Abdominal   Peds  Hematology  (+) anemia , Hx/o chemotherapy induced thrombocytopenia-recovered Eczema   Anesthesia Other Findings   Reproductive/Obstetrics                            Anesthesia Physical Anesthesia Plan  ASA: III  Anesthesia Plan: General   Post-op Pain Management:    Induction: Intravenous  PONV Risk Score and Plan: 3 and Ondansetron, Dexamethasone, Propofol, Midazolam and Treatment may vary due to age  Airway Management Planned: Oral ETT  Additional Equipment:   Intra-op Plan:   Post-operative Plan: Extubation in OR  Informed Consent: I have reviewed the patients History and Physical, chart, labs and discussed the procedure including the risks, benefits and alternatives for the proposed anesthesia with the patient or authorized representative who has indicated his/her  understanding and acceptance.     Plan Discussed with: Anesthesiologist, CRNA and Surgeon  Anesthesia Plan Comments:         Anesthesia Quick Evaluation

## 2016-10-01 NOTE — Telephone Encounter (Signed)
Leah with [facility] is requesting a written order for pt to keep the wheel chair that was provided by Hospice.  Fax [phone]

## 2016-10-01 NOTE — Telephone Encounter (Signed)
Please review. Thanks!  

## 2016-10-01 NOTE — Discharge Instructions (Signed)
[facility]th[facility] Discharge Instructions   General Discharge Instructions  WEIGHT BEARING STATUS: weightbearing as tolerated R arm  RANGE OF MOTION/ACTIVITY: as tolerated Right arm, no range of motion restrictions  Wound Care: daily wound care, see below. Clean wounds with soap and water only    Discharge Wound Care Instructions  Do NOT apply any ointments, solutions or lotions to pin sites or surgical wounds.  These prevent needed drainage and even though solutions like hydrogen peroxide kill bacteria, they also damage cells lining the pin sites that help fight infection.  Applying lotions or ointments can keep the wounds moist and can cause them to breakdown and open up as well. This can increase the risk for infection. When in doubt call the office.  Surgical incisions should be dressed daily.  If any drainage is noted, use one layer of adaptic, then gauze, Kerlix, and an ace wrap.  Once the incision is completely dry and without drainage, it may be left open to air out.  Showering may begin 36-48 hours later.  Cleaning gently with soap and water.  Traumatic wounds should be dressed daily as well.    One layer of adaptic, gauze, Kerlix, then ace wrap.  The adaptic can be discontinued once the draining has ceased    If you have a wet to dry dressing: wet the gauze with saline the squeeze as much saline out so the gauze is moist (not soaking wet), place moistened gauze over wound, then place a dry gauze over the moist one, followed by Kerlix wrap, then ace wrap.  PAIN MEDICATION USE AND EXPECTATIONS  You have likely been given narcotic medications to help control your pain.  After a traumatic event that results in an fracture (broken bone) with or without surgery, it is ok to use narcotic pain medications to help control one's pain.  We understand that everyone responds to pain differently and each individual patient will be evaluated on a regular basis for the continued need  for narcotic medications. Ideally, narcotic medication use should last no more than 6-8 weeks (coinciding with fracture healing).   As a patient it is your responsibility as well to monitor narcotic medication use and report the amount and frequency you use these medications when you come to your office visit.   We would also advise that if you are using narcotic medications, you should take a dose prior to therapy to maximize you participation.  IF YOU ARE ON NARCOTIC MEDICATIONS IT IS NOT PERMISSIBLE TO OPERATE A MOTOR VEHICLE (MOTORCYCLE/CAR/TRUCK/MOPED) OR HEAVY MACHINERY DO NOT MIX NARCOTICS WITH OTHER CNS (CENTRAL NERVOUS SYSTEM) DEPRESSANTS SUCH AS ALCOHOL  Diet: as you were eating previously.  Can use over the counter stool softeners and bowel preparations, such as Miralax, to help with bowel movements.  Narcotics can be constipating.  Be sure to drink plenty of fluids    STOP SMOKING OR USING NICOTINE PRODUCTS!!!!  As discussed nicotine severely impairs your body's ability to heal surgical and traumatic wounds but also impairs bone healing.  Wounds and bone heal by forming microscopic blood vessels (angiogenesis) and nicotine is a vasoconstrictor (essentially, shrinks blood vessels).  Therefore, if vasoconstriction occurs to these microscopic blood vessels they essentially disappear and are unable to deliver necessary nutrients to the healing tissue.  This is one modifiable factor that you can do to dramatically increase your chances of healing your injury.    (This means no smoking, no nicotine gum, patches, etc)  DO NOT USE NONSTEROIDAL  ANTI-INFLAMMATORY DRUGS (NSAID'S)  Using products such as Advil (ibuprofen), Aleve (naproxen), Motrin (ibuprofen) for additional pain control during fracture healing can delay and/or prevent the healing response.  If you would like to take over the counter (OTC) medication, Tylenol (acetaminophen) is ok.  However, some narcotic medications that are given  for pain control contain acetaminophen as well. Therefore, you should not exceed more than 4000 mg of tylenol in a day if you do not have liver disease.  Also note that there are may OTC medicines, such as cold medicines and allergy medicines that my contain tylenol as well.  If you have any questions about medications and/or interactions please ask your doctor/PA or your pharmacist.      ICE AND ELEVATE INJURED/OPERATIVE EXTREMITY  Using ice and elevating the injured extremity above your heart can help with swelling and pain control.  Icing in a pulsatile fashion, such as 20 minutes on and 20 minutes off, can be followed.    Do not place ice directly on skin. Make sure there is a barrier between to skin and the ice pack.    Using frozen items such as frozen peas works well as the conform nicely to the are that needs to be iced.  USE AN ACE WRAP OR TED HOSE FOR SWELLING CONTROL  In addition to icing and elevation, Ace wraps or TED hose are used to help limit and resolve swelling.  It is recommended to use Ace wraps or TED hose until you are informed to stop.    When using Ace Wraps start the wrapping distally (farthest away from the body) and wrap proximally (closer to the body)   Example: If you had surgery on your leg or thing and you do not have a splint on, start the ace wrap at the toes and work your way up to the thigh        If you had surgery on your upper extremity and do not have a splint on, start the ace wrap at your fingers and work your way up to the upper arm  IF YOU ARE IN A SPLINT OR CAST DO NOT [place]   If your splint gets wet for any reason please contact the office immediately. You may shower in your splint or cast as long as you keep it dry.  This can be done by wrapping in a cast cover or garbage back (or similar)  Do Not stick any thing down your splint or cast such as pencils, money, or hangers to try and scratch yourself with.  If you feel itchy take  benadryl as prescribed on the bottle for itching  IF YOU ARE IN A CAM BOOT (BLACK BOOT)  You may remove boot periodically. Perform daily dressing changes as noted below.  Wash the liner of the boot regularly and wear a sock when wearing the boot. It is recommended that you sleep in the boot until told otherwise  CALL THE OFFICE WITH ANY QUESTIONS OR CONCERNS: [phone]

## 2016-10-01 NOTE — H&P (Signed)
Orthopaedic Trauma Service H&P   Chief Complaint: pathologic fracture R humeral shaft  HPI:   71 year old right-hand-dominant male with pathologic fracture of the right humeral shaft. 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 Patient sustained a fall on 09/12/2016 where he tripped over his walker. Had immediate onset of pain in his right arm with deformity. Patient was brought to  for evaluation and was found to have a right humeral shaft fracture. Patient lives in the [place] area. He initially followed up with Dr. Mack Guise with [facility] in [place]. Due to the complexity injury patient was referred to the orthopedic trauma specialists for definitive management. Fracture is felt to be through a metastatic lesion. Patient presents today for palliative treatment of his right humeral shaft fracture. Patient is on hospice for his renal cell carcinoma as well. Patient has an extensive medical history and as noted below  Past Medical History:  Diagnosis Date  . AAA (abdominal aortic aneurysm) ([place])   . Anxiety   . Asthma   . Cancer [facility])    Renal Cell- 2013; Lung 2017  . Chronic kidney disease    Renal Cell Adenocarcinoma  . Chronic kidney disease    Left Nephrectomy  . COPD (chronic obstructive pulmonary disease) ([place])   . Coronary artery disease   . Dehydration 12/16/2015  . Depression   . Diabetes mellitus without complication ([place])    Type II  . Eczema   . Encounter for antineoplastic chemotherapy 08/19/2015  . Encounter for antineoplastic immunotherapy 01/06/2016  . GERD (gastroesophageal reflux disease)   . Headache   . Heart murmur   . History of radiation therapy 03/24/16- 04/06/2016   Left Lung/ Mediastinum 30 Gy in 10 fractions  . HPTH (hyperparathyroidism) ([place])   . Hx of radiation therapy 08/20/15   Right Temporal brain  . Hyperlipidemia   . Hyperparathyroidism ([place])   . Hypertension   .  Hypertension 08/19/2015  . Hypothyroidism   . Malignant neoplasm of left kidney ([place]) 02/09/2013  . Melena 03/17/2016  . Neuropathy   . Shortness of breath dyspnea   . Sleep apnea    CPAP  . Thoracic aortic aneurysm ([place]) 2017    Past Surgical History:  Procedure Laterality Date  . BACK SURGERY     compressed vertebrae-put in a plate to seprate the spaces  . CARDIAC CATHETERIZATION    . CERVICAL FUSION    . CORONARY ARTERY BYPASS GRAFT     x 5  . ENDOBRONCHIAL ULTRASOUND N/A 06/30/2015   Procedure: ENDOHIAL ULTRASOUND;  Surgeon: Flora Lipps, MD;  Location: [facility] ORS;  Service: Cardiopulmonary;  Laterality: N/A;  . FRACTURE SURGERY     left arm repair  . KNEE SURGERY     left knee repair  . PARATHYROIDECTOMY  12/17/09  . VIDEO BRONCHOSCOPY WITH ENDOBRONCHIAL ULTRASOUND N/A 07/09/2015   Procedure: VIDEO BRONCHOSCOPY WITH ENDOBRONCHIAL ULTRASOUND;  Surgeon: Ivin Poot, MD;  Location: [facility] OR;  Service: Thoracic;  Laterality: N/A;    Family History  Problem Relation Age of Onset  . Stroke Mother   . Cardiomyopathy Mother   . Hypertension Mother   . Hypertension Father   . Congestive Heart Failure Father   . Heart disease Brother   . Heart attack Brother   . Hypertension Brother   . Diabetes Brother   . Obesity Brother   . Heart disease Brother   . Sleep apnea Son    Social History:  reports that he quit smoking about 14 years ago. His smoking use included Cigarettes. He has a 52.50 pack-year smoking history. He has never used smokeless tobacco. He reports that he does not drink alcohol or use drugs.  Allergies:  Allergies  Allergen Reactions  . Ace Inhibitors Shortness Of Breath, Other (See Comments) and Cough    WHEEZING   . Amoxicillin Anaphylaxis, Rash and Other (See Comments)    Has patient had a PCN reaction causing immediate rash, facial/tongue/throat swelling, SOB or lightheadedness with hypotension: Yes Has patient had a PCN reaction causing severe rash  involving mucus membranes or skin necrosis: No Has patient had a PCN reaction that required hospitalization No Has patient had a PCN reaction occurring within the last 10 years: No If all of the above answers are "NO", then may proceed with Cephalosporin use.  . Codeine Anaphylaxis, Diarrhea and Rash  . Penicillins Anaphylaxis, Rash and Other (See Comments)    Has patient had a PCN reaction causing immediate rash, facial/tongue/throat swelling, SOB or lightheadedness with hypotension: Yes Has patient had a PCN reaction causing severe rash involving mucus membranes or skin necrosis: No Has patient had a PCN reaction that required hospitalization No Has patient had a PCN reaction occurring within the last 10 years: No If all of the above answers are "NO", then may proceed with Cephalosporin use.    Current Facility-Administered Medications on File Prior to Encounter  Medication Dose Route Frequency Provider Last Rate Last Dose  . levalbuterol (XOPENEX) nebulizer solution 1.25 mg  1.25 mg Nebulization Once Mar Daring, PA-C       Current Outpatient Prescriptions on File Prior to Encounter  Medication Sig Dispense Refill  . acetaminophen (TYLENOL) 500 MG tablet Take 2 tablets (1,000 mg total) by mouth every 6 (six) hours as needed. 30 tablet 0  . ALPRAZolam (XANAX) 0.5 MG tablet TAKE ONE TABLET BY MOUTH TWICE DAILY AS NEEDED (Patient taking differently: TAKE ONE TABLET BY MOUTH TWICE DAILY AS NEEDED FOR ANXIETY) 60 tablet 5  . aspirin EC 81 MG tablet Take 1 tablet (81 mg total) by mouth at bedtime. (Patient not taking: Reported on 09/12/2016)    . calcium carbonate (OSCAL) 1500 (600 Ca) MG TABS tablet Take 600 mg of elemental calcium by mouth 2 (two) times daily with a meal.    . cholecalciferol (VITAMIN D) 1000 units tablet Take 1,000 Units by mouth at bedtime.    Marland Kitchen doxycycline (VIBRA-TABS) 100 MG tablet Take 1 tablet (100 mg total) by mouth 2 (two) times daily. (Patient not taking:  Reported on 05/20/2016) 20 tablet 0  . emollient (BIAFINE) cream Apply topically as needed. (Patient not taking: Reported on 04/23/2016) 454 g 0  . feeding supplement, ENSURE ENLIVE, (ENSURE ENLIVE) LIQD Take 237 mLs by mouth 2 (two) times daily between meals. (Patient not taking: Reported on 04/27/2016) 237 mL 12  . FeFum-FePoly-FA-B Cmp-C-Biot (FOLIVANE-PLUS) CAPS TAKE ONE CAPSULE BY MOUTH EVERY MORNING (Patient not taking: Reported on 09/12/2016) 30 capsule 0  . FLUoxetine (PROZAC) 40 MG capsule Take 40 mg by mouth daily.    Marland Kitchen FLUoxetine (PROZAC) 40 MG capsule TAKE ONE CAPSULE BY MOUTH EVERY DAY (Patient not taking: Reported on 09/12/2016) 90 capsule 3  . fluticasone (FLONASE) 50 MCG/ACT nasal spray Place 2 sprays into both nostrils daily. (Patient not taking: Reported on 09/12/2016) 16 g 12  . Fluticasone-Salmeterol (ADVAIR) 100-50 MCG/DOSE AEPB Inhale 1 puff into the lungs 2 (two) times daily.    Marland Kitchen levalbuterol (  XOPENEX) 1.25 MG/3ML nebulizer solution Take 1.25 mg by nebulization every 4 (four) hours as needed for wheezing. (Patient not taking: Reported on 09/12/2016) 72 mL 12  . levofloxacin (LEVAQUIN) 500 MG tablet Take 1 tablet (500 mg total) by mouth daily. (Patient not taking: Reported on 09/12/2016) 7 tablet 1  . lidocaine (XYLOCAINE) 2 % solution Mix 1 part 2%viscous lidocaine,1part H2O.Swish and/or swallow 54mL of this mixture,35min before meals and at bedtime, up to QID (Patient not taking: Reported on 09/12/2016) 100 mL 5  . loratadine (CLARITIN) 10 MG tablet Take 1 tablet (10 mg total) by mouth daily. 30 tablet 11  . meclizine (ANTIVERT) 25 MG tablet Take 25 mg by mouth 3 (three) times daily as needed for dizziness.     . methylPREDNISolone (MEDROL DOSEPAK) 4 MG TBPK tablet Medrol dose pak: Take as directed. (Patient not taking: Reported on 05/20/2016) 21 tablet 0  . mometasone (ELOCON) 0.1 % cream Apply 1 application topically 2 (two) times daily as needed (for itching).    . naproxen  (NAPROSYN) 500 MG tablet Take 1 tablet (500 mg total) by mouth 2 (two) times daily with a meal. (Patient not taking: Reported on 09/12/2016) 20 tablet 0  . PROAIR HFA 108 (90 Base) MCG/ACT inhaler INHALE 1 OR 2 PUFFS AS NEEDED (Patient not taking: Reported on 09/12/2016) 8.5 g 11  . prochlorperazine (COMPAZINE) 10 MG tablet TAKE 1 TABLET EVERY 6 HOURS AS NEEDED FOR NAUSEA AND VOMITING (Patient not taking: Reported on 09/12/2016) 30 tablet 0  . prochlorperazine (COMPAZINE) 10 MG tablet TAKE 1 TABLET EVERY 6 HOURS AS NEEDED FOR NAUSEA AND VOMITING (Patient not taking: Reported on 09/12/2016) 30 tablet 0  . promethazine-dextromethorphan (PROMETHAZINE-DM) 6.25-15 MG/5ML syrup Take 5 mLs by mouth 4 (four) times daily as needed for cough. (Patient not taking: Reported on 05/20/2016) 120 mL 0  . senna-docusate (SENOKOT-S) 8.6-50 MG tablet Take 1 tablet by mouth 2 (two) times daily.    . sucralfate (CARAFATE) 1 g tablet Dissolve 1 tablet in 13mL H2O and swallow up to QID,PRN sore throat. (Patient not taking: Reported on 04/27/2016) 60 tablet 5  . SUNItinib (SUTENT) 37.5 MG capsule Take 1 capsule (37.5 mg total) by mouth daily. (Patient not taking: Reported on 09/12/2016) 30 capsule 3  . temazepam (RESTORIL) 15 MG capsule Take 15 mg by mouth at bedtime as needed for sleep.    . traMADol (ULTRAM) 50 MG tablet Take 2 tablets (100 mg total) by mouth every 6 (six) hours as needed. 30 tablet 0   Labs pending  Review of Systems  Constitutional: Positive for malaise/fatigue and weight loss.  Respiratory: Positive for cough.   Cardiovascular: Negative for chest pain.  Gastrointestinal: Positive for nausea.  Neurological: Positive for weakness. Negative for sensory change.    Vitals on arrival to short stay Physical Exam  Constitutional:  Chronically ill-appearing white male  Cardiovascular: Regular rhythm, S1 normal and S2 normal.   Respiratory:  Decrease at bases:  Musculoskeletal:  Right upper  extremity Patient in sling and Sarmiento brace Deformity at mid humerus noted Motor and sensory functions are grossly intact fracture shows Palpable radial pulse Moderate swelling and ecchymosis Forearm, wrist and hand are nontender  Neurological: He is alert.     Assessment/Plan  71 year old right-hand-dominant male with pathologic fracture right humeral shaft  -Right pathologic fracture right mid shaft humerus  OR for repair of right humeral shaft fracture  Will proceed with intramedullary nailing. Concern for significant blood loss of this procedure  was done with plate osteosynthesis due to highly vascular metastatic lesion    This is primarily a palliative procedure to stabilize patient's fracture.  He is on hospice  Most recent hematology and oncology note does not look promising  - Pain management:  Adjust accordingly postoperatively  - ABL anemia/Hemodynamics  Preop labs  - Medical issues   Will contact medical service for either admission or consultation given numerous medical conditions   - ID:   periop abx   - Dispo:  OR for repair of R humeral shaft    Gabreal Worton W, PA-C 10/01/2016, 8:54 AM

## 2016-10-02 ENCOUNTER — Observation Stay (HOSPITAL_COMMUNITY): Payer: Medicare Other

## 2016-10-02 DIAGNOSIS — Z951 Presence of aortocoronary bypass graft: Secondary | ICD-10-CM | POA: Diagnosis not present

## 2016-10-02 DIAGNOSIS — C7802 Secondary malignant neoplasm of left lung: Secondary | ICD-10-CM | POA: Diagnosis present

## 2016-10-02 DIAGNOSIS — Z7982 Long term (current) use of aspirin: Secondary | ICD-10-CM | POA: Diagnosis not present

## 2016-10-02 DIAGNOSIS — Z8249 Family history of ischemic heart disease and other diseases of the circulatory system: Secondary | ICD-10-CM | POA: Diagnosis not present

## 2016-10-02 DIAGNOSIS — C7931 Secondary malignant neoplasm of brain: Secondary | ICD-10-CM | POA: Diagnosis present

## 2016-10-02 DIAGNOSIS — C649 Malignant neoplasm of unspecified kidney, except renal pelvis: Secondary | ICD-10-CM | POA: Diagnosis present

## 2016-10-02 DIAGNOSIS — Z9221 Personal history of antineoplastic chemotherapy: Secondary | ICD-10-CM | POA: Diagnosis not present

## 2016-10-02 DIAGNOSIS — Z833 Family history of diabetes mellitus: Secondary | ICD-10-CM | POA: Diagnosis not present

## 2016-10-02 DIAGNOSIS — E039 Hypothyroidism, unspecified: Secondary | ICD-10-CM | POA: Diagnosis present

## 2016-10-02 DIAGNOSIS — M84521D Pathological fracture in neoplastic disease, right humerus, subsequent encounter for fracture with routine healing: Secondary | ICD-10-CM

## 2016-10-02 DIAGNOSIS — C7951 Secondary malignant neoplasm of bone: Secondary | ICD-10-CM | POA: Diagnosis present

## 2016-10-02 DIAGNOSIS — C7801 Secondary malignant neoplasm of right lung: Secondary | ICD-10-CM | POA: Diagnosis present

## 2016-10-02 DIAGNOSIS — Z87891 Personal history of nicotine dependence: Secondary | ICD-10-CM | POA: Diagnosis not present

## 2016-10-02 DIAGNOSIS — Y92009 Unspecified place in unspecified non-institutional (private) residence as the place of occurrence of the external cause: Secondary | ICD-10-CM | POA: Diagnosis not present

## 2016-10-02 DIAGNOSIS — Z905 Acquired absence of kidney: Secondary | ICD-10-CM | POA: Diagnosis not present

## 2016-10-02 DIAGNOSIS — J449 Chronic obstructive pulmonary disease, unspecified: Secondary | ICD-10-CM | POA: Diagnosis present

## 2016-10-02 DIAGNOSIS — F419 Anxiety disorder, unspecified: Secondary | ICD-10-CM | POA: Diagnosis present

## 2016-10-02 DIAGNOSIS — Z7951 Long term (current) use of inhaled steroids: Secondary | ICD-10-CM | POA: Diagnosis not present

## 2016-10-02 DIAGNOSIS — S42301A Unspecified fracture of shaft of humerus, right arm, initial encounter for closed fracture: Secondary | ICD-10-CM | POA: Diagnosis present

## 2016-10-02 DIAGNOSIS — W010XXA Fall on same level from slipping, tripping and stumbling without subsequent striking against object, initial encounter: Secondary | ICD-10-CM | POA: Diagnosis present

## 2016-10-02 DIAGNOSIS — Z79899 Other long term (current) drug therapy: Secondary | ICD-10-CM | POA: Diagnosis not present

## 2016-10-02 DIAGNOSIS — D62 Acute posthemorrhagic anemia: Secondary | ICD-10-CM | POA: Diagnosis present

## 2016-10-02 DIAGNOSIS — E785 Hyperlipidemia, unspecified: Secondary | ICD-10-CM | POA: Diagnosis present

## 2016-10-02 DIAGNOSIS — Z923 Personal history of irradiation: Secondary | ICD-10-CM | POA: Diagnosis not present

## 2016-10-02 DIAGNOSIS — F329 Major depressive disorder, single episode, unspecified: Secondary | ICD-10-CM | POA: Diagnosis present

## 2016-10-02 DIAGNOSIS — Z823 Family history of stroke: Secondary | ICD-10-CM | POA: Diagnosis not present

## 2016-10-02 DIAGNOSIS — K219 Gastro-esophageal reflux disease without esophagitis: Secondary | ICD-10-CM | POA: Diagnosis present

## 2016-10-02 LAB — BASIC METABOLIC PANEL
Anion gap: 14 (ref 5–15)
BUN: 23 mg/dL — ABNORMAL HIGH (ref 6–20)
CHLORIDE: 103 mmol/L (ref 101–111)
CO2: 18 mmol/L — ABNORMAL LOW (ref 22–32)
Calcium: 9.4 mg/dL (ref 8.9–10.3)
Creatinine, Ser: 1.73 mg/dL — ABNORMAL HIGH (ref 0.61–1.24)
GFR calc non Af Amer: 38 mL/min — ABNORMAL LOW (ref >60)
GFR, EST AFRICAN AMERICAN: 44 mL/min — AB (ref >60)
Glucose, Bld: 99 mg/dL (ref 65–99)
POTASSIUM: 4.3 mmol/L (ref 3.5–5.1)
SODIUM: 135 mmol/L (ref 135–145)

## 2016-10-02 LAB — CBC
HCT: 29.5 % — ABNORMAL LOW (ref 39.0–52.0)
Hemoglobin: 9.2 g/dL — ABNORMAL LOW (ref 13.0–17.0)
MCH: 27.3 pg (ref 26.0–34.0)
MCHC: 31.2 g/dL (ref 30.0–36.0)
MCV: 87.5 fL (ref 78.0–100.0)
PLATELETS: 423 10*3/uL — AB (ref 150–400)
RBC: 3.37 MIL/uL — AB (ref 4.22–5.81)
RDW: 15.2 % (ref 11.5–15.5)
WBC: 13.1 10*3/uL — AB (ref 4.0–10.5)

## 2016-10-02 LAB — GLUCOSE, CAPILLARY
GLUCOSE-CAPILLARY: 138 mg/dL — AB (ref 65–99)
GLUCOSE-CAPILLARY: 194 mg/dL — AB (ref 65–99)
GLUCOSE-CAPILLARY: 189 mg/dL — AB (ref 65–99)
Glucose-Capillary: 177 mg/dL — ABNORMAL HIGH (ref 65–99)

## 2016-10-02 LAB — TSH: TSH: 3.167 u[IU]/mL (ref 0.350–4.500)

## 2016-10-02 MED ORDER — POLYETHYLENE GLYCOL 3350 17 G PO PACK
17.0000 g | PACK | Freq: Every day | ORAL | Status: DC
  Administered 2016-10-02 – 2016-10-03 (×2): 17 g via ORAL
  Filled 2016-10-02 (×2): qty 1

## 2016-10-02 MED ORDER — PANTOPRAZOLE SODIUM 40 MG IV SOLR
40.0000 mg | Freq: Two times a day (BID) | INTRAVENOUS | Status: DC
  Administered 2016-10-02 – 2016-10-03 (×2): 40 mg via INTRAVENOUS
  Filled 2016-10-02 (×3): qty 40

## 2016-10-02 MED ORDER — IOPAMIDOL (ISOVUE-300) INJECTION 61%
INTRAVENOUS | Status: AC
  Administered 2016-10-02: 30 mL
  Filled 2016-10-02: qty 30

## 2016-10-02 MED ORDER — OXYCODONE HCL 5 MG PO TABS: 2.5000 mg | ORAL_TABLET | ORAL | Status: DC | PRN

## 2016-10-02 MED ORDER — TRAMADOL HCL 50 MG PO TABS
50.0000 mg | ORAL_TABLET | Freq: Four times a day (QID) | ORAL | Status: DC | PRN
  Administered 2016-10-02 – 2016-10-03 (×3): 50 mg via ORAL
  Filled 2016-10-02 (×3): qty 1

## 2016-10-02 MED ORDER — FAMOTIDINE 20 MG PO TABS
20.0000 mg | ORAL_TABLET | Freq: Every day | ORAL | Status: DC
  Administered 2016-10-02: 20 mg via ORAL
  Filled 2016-10-02: qty 1

## 2016-10-02 MED ORDER — HYDROCODONE-ACETAMINOPHEN 5-325 MG PO TABS: 1.0000 | ORAL_TABLET | Freq: Four times a day (QID) | ORAL | Status: DC | PRN

## 2016-10-02 MED ORDER — DEXAMETHASONE SODIUM PHOSPHATE 10 MG/ML IJ SOLN
10.0000 mg | Freq: Once | INTRAMUSCULAR | Status: AC
  Administered 2016-10-02: 10 mg via INTRAVENOUS
  Filled 2016-10-02: qty 1

## 2016-10-02 MED ORDER — ALUM & MAG HYDROXIDE-SIMETH 200-200-20 MG/5ML PO SUSP
15.0000 mL | ORAL | Status: DC | PRN
  Filled 2016-10-02: qty 30

## 2016-10-02 MED ORDER — MORPHINE SULFATE (PF) 4 MG/ML IV SOLN: 2.0000 mg | INTRAVENOUS | Status: DC | PRN

## 2016-10-02 MED ORDER — PROMETHAZINE HCL 25 MG/ML IJ SOLN
12.5000 mg | Freq: Four times a day (QID) | INTRAMUSCULAR | Status: DC | PRN
  Filled 2016-10-02: qty 1

## 2016-10-02 MED ORDER — SILVER SULFADIAZINE 1 % EX CREA
TOPICAL_CREAM | Freq: Two times a day (BID) | CUTANEOUS | Status: DC
  Administered 2016-10-02: 18:00:00 via TOPICAL
  Administered 2016-10-02: 1 via TOPICAL
  Administered 2016-10-03: 09:00:00 via TOPICAL
  Filled 2016-10-02 (×2): qty 85

## 2016-10-02 MED ORDER — OXYCODONE HCL 5 MG PO TABS
2.5000 mg | ORAL_TABLET | ORAL | Status: DC | PRN
  Filled 2016-10-02: qty 1

## 2016-10-02 MED ORDER — METOCLOPRAMIDE HCL 5 MG/ML IJ SOLN: 10.0000 mg | Freq: Four times a day (QID) | INTRAMUSCULAR | Status: DC

## 2016-10-02 MED ORDER — HYDROMORPHONE HCL 1 MG/ML IJ SOLN: 0.5000 mg | INTRAMUSCULAR | Status: DC | PRN

## 2016-10-02 MED ORDER — SODIUM CHLORIDE 0.9 % IV BOLUS (SEPSIS)
500.0000 mL | Freq: Once | INTRAVENOUS | Status: AC
  Administered 2016-10-02: 500 mL via INTRAVENOUS

## 2016-10-02 MED ORDER — ZOLPIDEM TARTRATE 5 MG PO TABS
5.0000 mg | ORAL_TABLET | Freq: Every day | ORAL | Status: DC
  Administered 2016-10-02: 5 mg via ORAL
  Filled 2016-10-02: qty 1

## 2016-10-02 MED ORDER — TAMSULOSIN HCL 0.4 MG PO CAPS
0.4000 mg | ORAL_CAPSULE | Freq: Every day | ORAL | Status: DC
  Administered 2016-10-02: 0.4 mg via ORAL
  Filled 2016-10-02 (×2): qty 1

## 2016-10-02 NOTE — Progress Notes (Signed)
Called to assist pt with CPAP machine. Pt states he can't wear our mask/use our machine. Advised pt to have family member to bring his mask from home if he would like to try with his mask. Reassured pt that if he changed his mind and wanted to wear CPAP, RT would assist him. RT will continue to monitor.

## 2016-10-02 NOTE — Evaluation (Signed)
Physical Therapy Evaluation Patient Details Name: Patrick DENEAULT Sr. MRN: [id number] DOB: 1945-05-08 Today's Date: 10/02/2016   History of Present Illness  Pt is a 71 year old right-hand-dominant male with pathologic fracture of the right humeral shaft. 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 Patient sustained a fall on 09/12/2016 where he tripped over his walker and felt immediate acute pain in RUE.   Clinical Impression  Pt admitted with above diagnosis. Pt currently with functional limitations due to the deficits listed below (see PT Problem List). On eval, pt required mod assist bed mobility, min assist +2 for safety with transfers and min assist gait with RW 10 feet with +2 assist for close chair follow. Pt will benefit from skilled PT to increase their independence and safety with mobility to allow discharge home. PT to follow while in acute care. No follow up services indicated.       Follow Up Recommendations No PT follow up;Supervision/Assistance - 24 hour    Equipment Recommendations  None recommended by PT    Recommendations for Other Services       Precautions / Restrictions Precautions Precautions: Fall Required Braces or Orthoses: Sling (for comfort) Restrictions RUE Weight Bearing: Weight bearing as tolerated      Mobility  Bed Mobility Overal bed mobility: Needs Assistance Bed Mobility: Supine to Sit     Supine to sit: Mod assist     General bed mobility comments: verbal cues for sequencing  Transfers Overall transfer level: Needs assistance Equipment used: Rolling walker (2 wheeled) Transfers: Sit to/from [employer] Transfers Sit to Stand: Min assist;+2 safety/equipment Stand pivot transfers: +2 safety/equipment;Min assist       General transfer comment: verbal cues for hand placement and sequencing  Ambulation/Gait Ambulation/Gait assistance: Min assist;+2 safety/equipment Ambulation  Distance (Feet): 10 Feet Assistive device: Rolling walker (2 wheeled) Gait Pattern/deviations: Step-through pattern;Decreased stride length Gait velocity: decreased Gait velocity interpretation: Below normal speed for age/gender General Gait Details: Close chair follow during ambulation. Pt on 3 L O2 at rest with SpO2 94%. Ambulated on RA with sats of 93%. O2 via [place] replaced in recliner.   Stairs            Wheelchair Mobility    Modified Rankin (Stroke Patients Only)       Balance Overall balance assessment: Needs assistance Sitting-balance support: No upper extremity supported;Feet supported Sitting balance-Leahy Scale: Good     Standing balance support: Bilateral upper extremity supported;During functional activity Standing balance-Leahy Scale: Poor Standing balance comment: heavy reliance on RW, posterior lean                             Pertinent Vitals/Pain Pain Assessment: 0-10 Pain Score: 7  Pain Location: RUE Pain Descriptors / Indicators: Sore;Grimacing;Guarding Pain Intervention(s): Limited activity within patient's tolerance;Monitored during session;Repositioned;Ice applied    Home Living Family/patient expects to be discharged to:: Private residence Living Arrangements: Spouse/significant other Available Help at Discharge: Family;Personal care attendant Type of Home: House Home Access: Level entry     Home Layout: One level Home Equipment: Environmental consultant - 2 wheels;Wheelchair - manual      Prior Function Level of Independence: Needs assistance   Gait / Transfers Assistance Needed: Ambulated short household distances with RW. Pt also has a wheelchair.  ADL's / Homemaking Assistance Needed: PCA 3 x week to assist with bathing. Wife assisted with dressing and hygeine.  Hand Dominance   Dominant Hand: Right    Extremity/Trunk Assessment                Communication   Communication: No difficulties  Cognition  Arousal/Alertness: Awake/alert Behavior During Therapy: WFL for tasks assessed/performed Overall Cognitive Status: Within Functional Limits for tasks assessed                                        General Comments      Exercises     Assessment/Plan    PT Assessment Patient needs continued PT services  PT Problem List Decreased activity tolerance;Decreased balance;Decreased mobility;Pain       PT Treatment Interventions DME instruction;Gait training;Functional mobility training;Therapeutic activities;Therapeutic exercise;Balance training;Patient/family education    PT Goals (Current goals can be found in the Care Plan section)  Acute Rehab PT Goals Patient Stated Goal: not stated PT Goal Formulation: With patient Time For Goal Achievement: 10/09/16 Potential to Achieve Goals: Fair    Frequency Min 5X/week   Barriers to discharge        Co-evaluation               AM-PAC PT "6 Clicks" Daily Activity  Outcome Measure Difficulty turning over in bed (including adjusting bedclothes, sheets and blankets)?: A Lot Difficulty moving from lying on back to sitting on the side of the bed? : Total Difficulty sitting down on and standing up from a chair with arms (e.g., wheelchair, bedside commode, etc,.)?: Total Help needed moving to and from a bed to chair (including a wheelchair)?: A Little Help needed walking in hospital room?: A Little Help needed climbing 3-5 steps with a railing? : A Lot 6 Click Score: 12    End of Session Equipment Utilized During Treatment: Gait belt;Oxygen Activity Tolerance: Patient limited by pain;Patient limited by fatigue Patient left: in chair;with call bell/phone within reach Nurse Communication: Mobility status PT Visit Diagnosis: Pain;Other abnormalities of gait and mobility (R26.89);History of falling (Z91.81) Pain - Right/Left: Right Pain - part of body: Arm    Time: 6789-3810 PT Time Calculation (min) (ACUTE ONLY):  21 min   Charges:   PT Evaluation $PT Eval Moderate Complexity: 1 Procedure     PT G Codes:   PT G-Codes **NOT FOR INPATIENT CLASS** Functional Assessment Tool Used: AM-PAC 6 Clicks Basic Mobility Functional Limitation: Mobility: Walking and moving around Mobility: Walking and Moving Around Current Status (F7510): At least 60 percent but less than 80 percent impaired, limited or restricted Mobility: Walking and Moving Around Goal Status [phone]): At least 20 percent but less than 40 percent impaired, limited or restricted    Lorrin Goodell, PT  Office # [phone] Pager [phone]   Lorriane Shire 10/02/2016, 2:14 PM

## 2016-10-02 NOTE — Evaluation (Signed)
Occupational Therapy Evaluation Patient Details Name: Patrick ARIZMENDI Sr. MRN: [id number] DOB: Aug 19, 1945 Today's Date: 10/02/2016    History of Present Illness Pt is a 71 year old right-hand-dominant male with pathologic fracture of the right humeral shaft. 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 Patient sustained a fall on 09/12/2016 where he tripped over his walker and felt immediate acute pain in RUE.    Clinical Impression   PTA, pt was living with his wife who assisted with ADLs. Pt currently requires Mod-Max A for ADLs and Mod A for functional mobility due to posterior lean. Pt would benefit from acute OT to increase pt's safety and independence with ADLs and functional mobility. Recommend dc home once medically stable with 24 hour supervision to increase safety.     Follow Up Recommendations  No OT follow up;Supervision/Assistance - 24 hour    Equipment Recommendations       Recommendations for Other Services PT consult     Precautions / Restrictions Precautions Precautions: Fall Required Braces or Orthoses: Sling (for comfort) Restrictions Weight Bearing Restrictions: Yes RUE Weight Bearing: Weight bearing as tolerated      Mobility Bed Mobility Overal bed mobility: Needs Assistance Bed Mobility: Supine to Sit     Supine to sit: Mod assist;HOB elevated     General bed mobility comments: verbal cues for sequencing  Transfers Overall transfer level: Needs assistance Equipment used: Rolling walker (2 wheeled) Transfers: Sit to/from Stand Sit to Stand: Mod assist Stand pivot transfers: Mod assist       General transfer comment: verbal cues for hand placement and sequencing    Balance Overall balance assessment: Needs assistance Sitting-balance support: No upper extremity supported;Feet supported Sitting balance-Leahy Scale: Good     Standing balance support: Bilateral upper extremity supported;During  functional activity Standing balance-Leahy Scale: Poor Standing balance comment: heavy reliance on RW, posterior lean                           ADL either performed or assessed with clinical judgement   ADL Overall ADL's : Needs assistance/impaired Eating/Feeding: Set up;Sitting   Grooming: Wash/dry hands;Minimal assistance;Moderate assistance;Standing   Upper Body Bathing: Moderate assistance;Sitting   Lower Body Bathing: Maximal assistance;Sit to/from stand   Upper Body Dressing : Moderate assistance;Sitting   Lower Body Dressing: Maximal assistance;Sit to/from stand   Toilet Transfer: Minimal assistance;Ambulation;BSC;RW   Toileting- Clothing Manipulation and Hygiene: Minimal assistance;Sit to/from stand       Functional mobility during ADLs: Moderate assistance;Rolling walker General ADL Comments: Pt required Min-Mod A for standing balance throughout session. Pt Requires Max A for LB ADLs.      Vision         Perception     Praxis      Pertinent Vitals/Pain Pain Assessment: Faces Faces Pain Scale: Hurts little more Pain Location: RUE Pain Descriptors / Indicators: Sore;Grimacing;Guarding Pain Intervention(s): Monitored during session;Limited activity within patient's tolerance;Repositioned     Hand Dominance Right   Extremity/Trunk Assessment Upper Extremity Assessment Upper Extremity Assessment: RUE deficits/detail RUE Deficits / Details: pathological fracture of the right humeral shaft. Waiting for ROM orders from MD to assess   Lower Extremity Assessment Lower Extremity Assessment: Generalized weakness       Communication Communication Communication: No difficulties   Cognition Arousal/Alertness: Awake/alert Behavior During Therapy: WFL for tasks assessed/performed Overall Cognitive Status: Within Functional Limits for tasks assessed  General Comments       Exercises      Shoulder Instructions      Home Living Family/patient expects to be discharged to:: Private residence Living Arrangements: Spouse/significant other Available Help at Discharge: Family;Personal care attendant Type of Home: House Home Access: Level entry     Home Layout: One level         Bathroom Toilet: Standard     Home Equipment: Environmental consultant - 2 wheels;Wheelchair - manual          Prior Functioning/Environment Level of Independence: Needs assistance  Gait / Transfers Assistance Needed: Ambulated short household distances with RW. Pt also has a wheelchair. ADL's / Homemaking Assistance Needed: PCA 3 x week to assist with bathing. Wife assisted with dressing and hygeine.             OT Problem List: Decreased strength;Decreased range of motion;Decreased activity tolerance;Impaired balance (sitting and/or standing);Decreased safety awareness;Decreased knowledge of use of DME or AE;Decreased knowledge of precautions;Pain;Impaired UE functional use      OT Treatment/Interventions: Self-care/ADL training;Therapeutic exercise;Energy conservation;DME and/or AE instruction;Therapeutic activities;Patient/family education    OT Goals(Current goals can be found in the care plan section) Acute Rehab OT Goals Patient Stated Goal: not stated OT Goal Formulation: With patient Time For Goal Achievement: 10/16/16 Potential to Achieve Goals: Good ADL Goals Pt Will Perform Grooming: with min guard assist;standing Pt Will Perform Upper Body Dressing: with min assist;with caregiver independent in assisting;sitting Pt Will Perform Lower Body Dressing: with min assist;with caregiver independent in assisting;sit to/from stand Pt Will Transfer to Toilet: with min guard assist;ambulating;bedside commode Pt Will Perform Toileting - Clothing Manipulation and hygiene: with min guard assist;sit to/from stand  OT Frequency: Min 2X/week   Barriers to D/C:            Co-evaluation               AM-PAC PT "6 Clicks" Daily Activity     Outcome Measure Help from another person eating meals?: None Help from another person taking care of personal grooming?: A Little Help from another person toileting, which includes using toliet, bedpan, or urinal?: A Little Help from another person bathing (including washing, rinsing, drying)?: A Lot Help from another person to put on and taking off regular upper body clothing?: A Little Help from another person to put on and taking off regular lower body clothing?: A Lot 6 Click Score: 17   End of Session Equipment Utilized During Treatment: Gait belt;Rolling walker Nurse Communication: Mobility status  Activity Tolerance: Patient tolerated treatment well Patient left: in bed;with call bell/phone within reach (with transport team)  OT Visit Diagnosis: Unsteadiness on feet (R26.81);Other abnormalities of gait and mobility (R26.89);Muscle weakness (generalized) (M62.81);Pain Pain - Right/Left: Right Pain - part of body: Arm                Time: 9163-8466 OT Time Calculation (min): 40 min Charges:  OT General Charges $OT Visit: 1 Procedure OT Evaluation $OT Eval Moderate Complexity: 1 Procedure OT Treatments $Self Care/Home Management : 8-22 mins G-Codes: OT G-codes **NOT FOR INPATIENT CLASS** Functional Assessment Tool Used: Clinical judgement Functional Limitation: Self care Self Care Current Status (Z9935): At least 20 percent but less than 40 percent impaired, limited or restricted Self Care Goal Status (T0177): At least 1 percent but less than 20 percent impaired, limited or restricted   [place], OTR/L Acute Rehab Pager: [phone] Office: [place] 10/02/2016, 4:21 PM

## 2016-10-02 NOTE — Consult Note (Signed)
[place] Nurse wound consult note Reason for Consult:Partial thickness skin loss to right upper arm.  Present after right humeral nailing.  Wound type: Device related pressure injury, patient states a stabilizer was applied in this area.   Pressure Injury POA: No Measurement:Circumferential partial thickness skin loss 4 cm x 22 cm x 0.1 cm  Wound CBJ:SEGB and moist Drainage (amount, consistency, odor) None noted Periwound:intact.  Surgical sites.  Dressing procedure/placement/frequency:Due to limited ROM to right shoulder, will order Silvadene cream to be applied twice daily.  NO cover dressing.   Will not follow at this time.  Please re-consult if needed.  Domenic Moras RN BSN [place] Pager [phone]

## 2016-10-02 NOTE — Progress Notes (Addendum)
[facility] Consultation Progress Note  Patient: [name] KVQ:[id number]   PCP: [name]., MD DOB: 11/02/1945   DOA: 10/01/2016   DOS: 10/02/2016   Date of Service: the patient was seen and examined on 10/02/2016 Primary service: [place], MD   Subjective: Complains about severe nausea and unable to eat anything due to that. Also complains about episode of vomiting yesterday. No chest pain or abdominal pain. Mentions that he is having hard time breathing. Pain is also not well controlled on the shoulder. Was not able to sleep for last 3 nights. Passing gas but no BM yesterday. No other complaints at present.  Brief hospital course: Pt. with PMH of Renal cell cancer, lung cancer, metastatic brain mass, on hospice, COPD, CAD, CKD stage III, left nephrectomy, type II DM, GERD, anxiety; admitted on 10/01/2016, with complaint of fracture of the humerus, and has undergone palliative nail placement in the humeral shaft. Patient is not on hospice Currently further plan is monitor postoperative course.  Assessment and Plan: 1. Pathologic fracture of right humerus S/P intramedullary nail placement 10/01/2016. Pain is still moderately controlled. Patient is actually not taking any of his pain medication. Patient is prescribed tramadol. I would currently increase his pain regimen to OxyIR. Addendum: Family refusing oxycodone to be used on patient. Will switch back to tramadol Pain management now per orthopedics.   Wisam Siefring 11:20 AM 10/02/2016   Also given his renal disease patient will be placed on Dilaudid IV for severe pain for breakthrough. Further management per orthopedics.  2. Severe nausea with episode of vomiting. Likely nociceptive pain. Bowel sounds are present no abdominal tenderness on examination. We will get x-ray of the abdomen. Continue when necessary Zofran. Please scheduled MiraLAX.  3. Urinary retention. Likely postoperative. Control pain,  control constipation. Added Flomax. He cannot 1 if needed. if there is any further urinary retention after one in and out Foley catheter should be placed. Continue to monitor at present.  4. Insomnia. Likely associated with pain. Scheduled Ambien tonight.  5. Renal cell carcinoma with brain metastasis. Lung cancer. Addendum: Patient is not on hospice anymore. I received a call from hospice personnel, patient has revoked his hospice and wants to proceed with full scope of treatment for his pathological fracture including rehabilitation and radiation. Jayant Kriz 10:30 AM 10/02/2016   Patient is currently on hospice. I notified hospice personnel for follow-up. code status will be discussed by hospice personnel.  6. Anxiety and mood disorder. Continue Xanax continue Prozac.  7. Type 2 diabetes mellitus. Last A1c was in September 2017 it was 6.1. Currently on sensitive sliding scale which I will continue.  8. COPD. No evidence of acute flareup at present. Continue when necessary nebulizer as well as scheduled inhaler.  9. GERD. Added Pepcid. When necessary Mylanta.  Bowel regimen: last BM prior to admission Diet: clear liquid diet DVT Prophylaxis: subcutaneous Heparin  Family Communication: no family was present at bedside, at the time of interview.   Disposition: We will continue to follow the patient.   Recommendation on discharge: Continue follow-up with hospice.  Other Consultants: none Procedures: Intramedullary nail placement Antibiotics: Anti-infectives    Start     Dose/Rate Route Frequency Ordered Stop   10/01/16 1900  clindamycin (CLEOCIN) IVPB 600 mg     600 mg 100 mL/hr over 30 Minutes Intravenous Every 6 hours 10/01/16 1849 10/02/16 0715   10/01/16 0900  clindamycin (CLEOCIN) IVPB 900 mg     900  mg 100 mL/hr over 30 Minutes Intravenous On call to O.R. 10/01/16 0858 10/01/16 1227      Objective: Physical Exam: Vitals:   10/01/16 2003 10/01/16 2142  10/02/16 0040 10/02/16 0259  BP: 107/69  115/69 115/65  Pulse: 99  (!) 104 98  Resp: 16  16 20   Temp: 97.5 F (36.4 C)  98.1 F (36.7 C) 98.1 F (36.7 C)  TempSrc: Oral  Oral Oral  SpO2: 98% 100% 97% 96%  Weight:      Height:        Intake/Output Summary (Last 24 hours) at 10/02/16 0846 Last data filed at 10/02/16 0530  Gross per 24 hour  Intake             1780 ml  Output             1700 ml  Net               80 ml   Filed Weights   10/01/16 1132  Weight: 81.6 kg (180 lb)   General: Alert, Awake and Oriented to Time, Place and Person. Appear in marked distress, affect appropriate Eyes: PERRL, Conjunctiva normal ENT: Oral Mucosa clear moist. Neck: no JVD, no Abnormal Mass Or lumps Cardiovascular: S1 and S2 Present, no Murmur, Peripheral Pulses Present Respiratory: increased respiratory effort, Bilateral Air entry equal and Decreased, no use of accessory muscle, Clear to Auscultation, no Crackles, no wheezes Abdomen: Bowel Sound present, Soft and no tenderness, no hernia Skin: no redness, no Rash, on induration Extremities: no Pedal edema, no calf tenderness Neurologic: Grossly no focal neuro deficit. Bilaterally Equal motor strength  Data Reviewed: CBC:  Recent Labs Lab 10/01/16 1129 10/02/16 0050  WBC 9.7 13.1*  NEUTROABS 7.1  --   HGB 9.1* 9.2*  HCT 30.0* 29.5*  MCV 88.2 87.5  PLT 456* 564*   Basic Metabolic Panel:  Recent Labs Lab 10/01/16 1129 10/02/16 0050  NA 134* 135  K 5.0 4.3  CL 99* 103  CO2 22 18*  GLUCOSE 104* 99  BUN 24* 23*  CREATININE 1.66* 1.73*  CALCIUM 10.2 9.4   Liver Function Tests:  Recent Labs Lab 10/01/16 1129  AST 20  ALT 9*  ALKPHOS 116  BILITOT 0.5  PROT 7.1  ALBUMIN 3.3*   No results for input(s): LIPASE, AMYLASE in the last 168 hours. No results for input(s): AMMONIA in the last 168 hours. Cardiac Enzymes: No results for input(s): CKTOTAL, CKMB, CKMBINDEX, TROPONINI in the last 168 hours. BNP (last 3  results) No results for input(s): BNP in the last 8760 hours. CBG:  Recent Labs Lab 10/01/16 1136 10/01/16 1513 10/02/16 0553  GLUCAP 88 111* 138*   No results found for this or any previous visit (from the past 240 hour(s)).  Studies: Dg Chest Port 1 View  Result Date: 10/01/2016 CLINICAL DATA:  Tachypnea. Status post right shoulder surgery today. EXAM: PORTABLE CHEST 1 VIEW COMPARISON:  04/16/2016 and CT of 04/23/2016 FINDINGS: Right proximal humerus fixation. Lower cervical spine fixation. Midline trachea. Mild cardiomegaly. Soft tissue fullness involves the mediastinum and left hilum. Probable small layering left pleural effusion. No pneumothorax. Worsened left lower lobe aeration. IMPRESSION: Combination of findings, including worsened left lower lobe aeration, probable layering left pleural effusion, and mediastinal/left hilar soft tissue fullness. Findings likely represent disease progression when compared to the 04/23/2016 CT. Repeat CT may be informative. Electronically Signed   By: Abigail Miyamoto M.D.   On: 10/01/2016 19:51   Dg Humerus  Right  Result Date: 10/01/2016 CLINICAL DATA:  Intramedullary nail placement in the right humeral fracture. EXAM: RIGHT HUMERUS - 2+ VIEW COMPARISON:  09/12/2016 FINDINGS: Intramedullary nail has been placed in the right humerus. There are 3 proximal interlocking screws. Three distal interlocking screws. There is a fracture involving the mid humerus with adjacent callus formation. Small fracture fragments are present. There is anatomic alignment of the humerus. IMPRESSION: Internal fixation of the mid right humeral fracture. Electronically Signed   By: Markus Daft M.D.   On: 10/01/2016 17:01   Dg Humerus Right  Result Date: 10/01/2016 CLINICAL DATA:  Hardware fixation of a probable pathological right humerus fracture. EXAM: RIGHT HUMERUS - 2+ VIEW; DG C-ARM 61-120 MIN COMPARISON:  Radiographs dated 09/12/2016. FINDINGS: 7 C-arm views of the right humerus  demonstrate interval intramedullary rod and screw fixation of the previously demonstrated probable pathological fracture of the mid right humerus. Again demonstrated is a lytic lesion at the fracture site with interval partial bridging callus formation or mass. Essentially anatomic position and alignment of the main fracture fragments. IMPRESSION: Hardware fixation of the previously demonstrated probable pathological fracture of the mid right humerus with anatomic position and alignment. Electronically Signed   By: Claudie Revering M.D.   On: 10/01/2016 14:41   Dg C-arm 1-60 Min  Result Date: 10/01/2016 CLINICAL DATA:  Hardware fixation of a probable pathological right humerus fracture. EXAM: RIGHT HUMERUS - 2+ VIEW; DG C-ARM 61-120 MIN COMPARISON:  Radiographs dated 09/12/2016. FINDINGS: 7 C-arm views of the right humerus demonstrate interval intramedullary rod and screw fixation of the previously demonstrated probable pathological fracture of the mid right humerus. Again demonstrated is a lytic lesion at the fracture site with interval partial bridging callus formation or mass. Essentially anatomic position and alignment of the main fracture fragments. IMPRESSION: Hardware fixation of the previously demonstrated probable pathological fracture of the mid right humerus with anatomic position and alignment. Electronically Signed   By: Claudie Revering M.D.   On: 10/01/2016 14:41     Scheduled Meds: . acetaminophen  1,000 mg Oral Q6H  . calcium carbonate  2 tablet Oral BID WC  . cholecalciferol  1,000 Units Oral QHS  . enoxaparin (LOVENOX) injection  40 mg Subcutaneous Q24H  . famotidine  20 mg Oral Daily  . FLUoxetine  40 mg Oral Daily  . insulin aspart  0-9 Units Subcutaneous TID WC  . loratadine  10 mg Oral Daily  . mometasone-formoterol  2 puff Inhalation BID  . polyethylene glycol  17 g Oral Daily  . senna-docusate  1 tablet Oral BID  . tamsulosin  0.4 mg Oral Daily  . zolpidem  5 mg Oral QHS    Continuous Infusions: . 0.9 % NaCl with KCl 20 mEq / L 50 mL/hr at 10/01/16 2032   PRN Meds: acetaminophen **OR** acetaminophen, ALPRAZolam, alum & mag hydroxide-simeth, HYDROcodone-acetaminophen, levalbuterol, metoCLOPramide **OR** metoCLOPramide (REGLAN) injection, morphine injection, ondansetron **OR** ondansetron (ZOFRAN) IV, prochlorperazine, temazepam  Time spent: 35 minutes  Author: Berle Mull, MD [facility] Pager: [phone] 10/02/2016 8:46 AM  If 7PM-7AM, please contact night-coverage at [email or web address], password [facility]

## 2016-10-02 NOTE — Progress Notes (Signed)
In and out was done last night and pt is still unable to void this time. Hospitalist was informed and ordered to do another in and out. Bladder scanned >800 cc and had an output of 700 cc yellow, hazy urine. Pt verbalized some relief. Nursing will continue to monitor.

## 2016-10-03 LAB — GLUCOSE, CAPILLARY
GLUCOSE-CAPILLARY: 96 mg/dL (ref 65–99)
Glucose-Capillary: 119 mg/dL — ABNORMAL HIGH (ref 65–99)

## 2016-10-03 LAB — BASIC METABOLIC PANEL
ANION GAP: 9 (ref 5–15)
BUN: 19 mg/dL (ref 6–20)
CALCIUM: 9 mg/dL (ref 8.9–10.3)
CO2: 18 mmol/L — AB (ref 22–32)
Chloride: 105 mmol/L (ref 101–111)
Creatinine, Ser: 1.39 mg/dL — ABNORMAL HIGH (ref 0.61–1.24)
GFR calc Af Amer: 57 mL/min — ABNORMAL LOW (ref >60)
GFR, EST NON AFRICAN AMERICAN: 49 mL/min — AB (ref >60)
GLUCOSE: 137 mg/dL — AB (ref 65–99)
POTASSIUM: 4.6 mmol/L (ref 3.5–5.1)
Sodium: 132 mmol/L — ABNORMAL LOW (ref 135–145)

## 2016-10-03 LAB — URINE CULTURE: Culture: NO GROWTH

## 2016-10-03 LAB — HEMOGLOBIN A1C
Hgb A1c MFr Bld: 5.6 % (ref 4.8–5.6)
MEAN PLASMA GLUCOSE: 114 mg/dL

## 2016-10-03 MED ORDER — SILVER SULFADIAZINE 1 % EX CREA: TOPICAL_CREAM | Freq: Two times a day (BID) | CUTANEOUS | 0 refills | Status: AC

## 2016-10-03 MED ORDER — TAMSULOSIN HCL 0.4 MG PO CAPS: 0.4000 mg | ORAL_CAPSULE | Freq: Every day | ORAL | 0 refills | Status: AC

## 2016-10-03 NOTE — Progress Notes (Signed)
Physical Therapy Treatment Patient Details Name: [name]. MRN: [id number] DOB: 1945/08/28 Today's Date: 10/03/2016    History of Present Illness Pt is a 71 year old right-hand-dominant male with pathologic fracture of the right humeral shaft. 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 Patient sustained a fall on 09/12/2016 where he tripped over his walker and felt immediate acute pain in RUE.     PT Comments    Wife present during session. Educated on WBAT status RUE and pt ability to use RW for ambulation. Pt to resume hospice services at d/c. Through hospice, he has RW, BSC, w/c, and hospital bed at home. He also has HHA 3 x week to assist with bathing.    Follow Up Recommendations  DC plan and follow up therapy as arranged by surgeon (No follow up therapy services. Pt to resume hospice care.)     Equipment Recommendations  None recommended by PT    Recommendations for Other Services       Precautions / Restrictions Precautions Precautions: Fall Required Braces or Orthoses: Sling (for comfort) Restrictions RUE Weight Bearing: Weight bearing as tolerated    Mobility  Bed Mobility               General bed mobility comments: Pt in recliner.  Transfers   Equipment used: Rolling walker (2 wheeled)   Sit to Stand: Min assist Stand pivot transfers: Min assist       General transfer comment: verbal cues for hand placement and sequencing  Ambulation/Gait Ambulation/Gait assistance: Min assist Ambulation Distance (Feet): 5 Feet Assistive device: Rolling walker (2 wheeled) Gait Pattern/deviations: Step-through pattern;Decreased stride length Gait velocity: decreased Gait velocity interpretation: Below normal speed for age/gender General Gait Details: Pt fatigues quickly.   Stairs            Wheelchair Mobility    Modified Rankin (Stroke Patients Only)       Balance   Sitting-balance  support: No upper extremity supported;Feet supported Sitting balance-Leahy Scale: Good     Standing balance support: Bilateral upper extremity supported;During functional activity Standing balance-Leahy Scale: Poor Standing balance comment: heavy reliance on RW, posterior lean                            Cognition Arousal/Alertness: Awake/alert Behavior During Therapy: WFL for tasks assessed/performed Overall Cognitive Status: Within Functional Limits for tasks assessed                                        Exercises      General Comments        Pertinent Vitals/Pain Pain Assessment: Faces Faces Pain Scale: Hurts little more Pain Location: RUE Pain Descriptors / Indicators: Sore;Grimacing;Guarding Pain Intervention(s): Monitored during session    Home Living                      Prior Function            PT Goals (current goals can now be found in the care plan section) Acute Rehab PT Goals Patient Stated Goal: home PT Goal Formulation: With patient Time For Goal Achievement: 10/09/16 Potential to Achieve Goals: Fair Progress towards PT goals: Progressing toward goals    Frequency    Min 5X/week      PT Plan Current  plan remains appropriate    Co-evaluation              AM-PAC PT "6 Clicks" Daily Activity  Outcome Measure  Difficulty turning over in bed (including adjusting bedclothes, sheets and blankets)?: A Little Difficulty moving from lying on back to sitting on the side of the bed? : Total Difficulty sitting down on and standing up from a chair with arms (e.g., wheelchair, bedside commode, etc,.)?: Total Help needed moving to and from a bed to chair (including a wheelchair)?: A Little Help needed walking in hospital room?: A Little Help needed climbing 3-5 steps with a railing? : A Lot 6 Click Score: 13    End of Session Equipment Utilized During Treatment: Gait belt Activity Tolerance: Patient  tolerated treatment well Patient left: in chair;with family/visitor present;with call bell/phone within reach Nurse Communication: Mobility status PT Visit Diagnosis: Pain;Other abnormalities of gait and mobility (R26.89);History of falling (Z91.81) Pain - Right/Left: Right Pain - part of body: Arm     Time: 1518-3437 PT Time Calculation (min) (ACUTE ONLY): 20 min  Charges:  $Therapeutic Activity: 8-22 mins                    G Codes:       Lorrin Goodell, PT  Office # [phone] Pager [phone]    Lorriane Shire 10/03/2016, 1:40 PM

## 2016-10-03 NOTE — Progress Notes (Signed)
1120--[facility]) Hospital Liaison--RN Visit  Notified by Carles Collet, [facility] of patient/family request for [facility] services at home after discharge. Chart and patient information are being reviewed with [facility] physician. Hospice eligibility pending at this time.  Spoke with patient and wife Elma at bedside. Patient and wife verbalized understanding of resuming hospice benefit which was revoked 09/30/16 in order to pursue surgical repair of pathological fracture of right humerus. Per discussion, plan is for discharge home today by private vehicle.  Patient will need prescriptions for discharge comfort medications.  DME needs have been discussed, patient currently has in the home a bed, wheelchair, walker, transfer chair, bedside commode and O2. Family requests that the O2 be picked up by [facility]. Aaron Edelman with [facility] has been notified and will arrange for pickup. The home address has been verified and is correct in the chart. Mickeal Needy is the family member to contact at [phone].  [facility] is aware of the above. Completed discharge summary will need to be faxed to [facility] at [phone]. Please notify [facility] when patient ready to leave the unit at discharge by calling [phone]. [facility] information and contact numbers have been given to [facility] during visit. Above information share with Carles Collet, RNCM.  Thank you, Margaretmary Eddy, [place] [phone]  DISH are on AMION.

## 2016-10-03 NOTE — Progress Notes (Signed)
Subjective: 2 Days Post-Op Procedure(s) (LRB): INTRAMEDULLARY (IM) NAIL HUMERAL (Right) Patient reports pain as 3 on 0-10 scale.    Objective: Vital signs in last 24 hours: Temp:  [97.5 F (36.4 C)-98.3 F (36.8 C)] 97.5 F (36.4 C) (06/10 0519) Pulse Rate:  [92-102] 92 (06/10 0519) Resp:  [16-18] 18 (06/10 0519) BP: (100-116)/(57-70) 116/70 (06/10 0519) SpO2:  [93 %-97 %] 97 % (06/10 0519) FiO2 (%):  [2 %] 2 % (06/10 0519)  Intake/Output from previous day: 06/09 0701 - 06/10 0700 In: 1117.5 [P.O.:120; I.V.:997.5] Out: 1600 [Urine:1600] Intake/Output this shift: No intake/output data recorded.   Recent Labs  10/01/16 1129 10/02/16 0050  HGB 9.1* 9.2*    Recent Labs  10/01/16 1129 10/02/16 0050  WBC 9.7 13.1*  RBC 3.40* 3.37*  HCT 30.0* 29.5*  PLT 456* 423*    Recent Labs  10/02/16 0050 10/03/16 0246  NA 135 132*  K 4.3 4.6  CL 103 105  CO2 18* 18*  BUN 23* 19  CREATININE 1.73* 1.39*  GLUCOSE 99 137*  CALCIUM 9.4 9.0    Recent Labs  10/01/16 1129  INR 1.03    ABD soft Neurovascular intact Sensation intact distally Intact pulses distally Incision: dressing C/D/I and no drainage  Assessment/Plan: 2 Days Post-Op Procedure(s) (LRB): INTRAMEDULLARY (IM) NAIL HUMERAL (Right)  Principal Problem:   Pathologic fracture of right humerus Active Problems:   COPD, mild (HCC)   Diabetes mellitus, type 2 (HCC)   Essential (primary) hypertension   HLD (hyperlipidemia)   Adult hypothyroidism   Acid reflux  Patient had significant difficulty with postoperative nausea and vomitting yesterday.  Gave him a fluid bolus of 500 cc normal saline and 10 mg of decadron IV as well as reglan 10 mg IV q 6 scheduled yesterday.  He was significantly improved yesterday afternoon when I checked on him again.  Wound continues to have no drainage.  He can have full passive range of motion of the right arm.  Agree with Dr Posey Pronto OK to discharge as long as he tolerates his  soft diet lunch Advance diet Up with therapy Discharge home with home health  Linda Hedges 10/03/2016, 8:36 AM

## 2016-10-03 NOTE — Care Management Note (Signed)
Case Management Note  Patient Details  Name: SEDRIC GUIA Sr. MRN: [id number] Date of Birth: October 08, 1945  Subjective/Objective:                 Spoke with patient who referred me to talk to his wife. She stated they would like to resume care with [facility]. She denied DME needs. Stated they have O2 at home that he is not currently using. She states they will travle home via private car. Spoke with Tacie with [facility]. She states she will go to patient's room next to speak with him and wife before they leave today. Relayed this to patient's wife.   Action/Plan:  Dc to home w [facility].  Expected Discharge Date:  10/03/16               Expected Discharge Plan:  Home w Hospice Care  In-House Referral:     Discharge planning Services  CM Consult  Post Acute Care Choice:  Hospice Choice offered to:  Patient, Spouse  DME Arranged:    DME Agency:     HH Arranged:    HH Agency:  [facility] [place]  Status of Service:  Completed, signed off  If discussed at [place] of Stay Meetings, dates discussed:    Additional Comments:  Carles Collet, RN 10/03/2016, 10:48 AM

## 2016-10-03 NOTE — Discharge Summary (Signed)
Patient ID: Patrick Breed Sr. MRN: [id number] DOB/AGE: 1945/12/25 71 y.o.  Admit date: 10/01/2016 Discharge date: 10/03/2016  Admission Diagnoses:  Principal Problem:   Pathologic fracture of right humerus Active Problems:   COPD, mild (HCC)   Diabetes mellitus, type 2 (HCC)   Essential (primary) hypertension   HLD (hyperlipidemia)   Adult hypothyroidism   Acid reflux   Discharge Diagnoses:  Same  Past Medical History:  Diagnosis Date  . AAA (abdominal aortic aneurysm) ([place])   . Anxiety   . Asthma   . Cancer [facility])    Renal Cell- 2013; Lung 2017  . Chronic kidney disease    Renal Cell Adenocarcinoma  . Chronic kidney disease    Left Nephrectomy  . COPD (chronic obstructive pulmonary disease) ([place])   . Coronary artery disease   . Dehydration 12/16/2015  . Depression   . Diabetes mellitus without complication ([place])    Type II  . Eczema   . Encounter for antineoplastic chemotherapy 08/19/2015  . Encounter for antineoplastic immunotherapy 01/06/2016  . GERD (gastroesophageal reflux disease)   . Headache   . Heart murmur   . History of radiation therapy 03/24/16- 04/06/2016   Left Lung/ Mediastinum 30 Gy in 10 fractions  . HPTH (hyperparathyroidism) ([place])   . Hx of radiation therapy 08/20/15   Right Temporal brain  . Hyperlipidemia   . Hyperparathyroidism ([place])   . Hypertension   . Hypertension 08/19/2015  . Hypothyroidism   . Malignant neoplasm of left kidney ([place]) 02/09/2013  . Melena 03/17/2016  . Neuropathy   . Shortness of breath dyspnea   . Sleep apnea    CPAP  . Thoracic aortic aneurysm ([place]) 2017    Surgeries: Procedure(s): INTRAMEDULLARY (IM) NAIL HUMERAL on 10/01/2016   Consultants:   Discharged Condition: Improved  Hospital Course: Vignesh Willert. is an 71 y.o. male who was admitted 10/01/2016 for operative treatment ofPathologic fracture of right humerus. Patient has severe unremitting pain that affects sleep, daily activities, and work/hobbies. After  pre-op clearance the patient was taken to the operating room on 10/01/2016 and underwent  Procedure(s): INTRAMEDULLARY (IM) NAIL HUMERAL.    Patient was given perioperative antibiotics:  Anti-infectives    Start     Dose/Rate Route Frequency Ordered Stop   10/01/16 1900  clindamycin (CLEOCIN) IVPB 600 mg     600 mg 100 mL/hr over 30 Minutes Intravenous Every 6 hours 10/01/16 1849 10/02/16 0715   10/01/16 0900  clindamycin (CLEOCIN) IVPB 900 mg     900 mg 100 mL/hr over 30 Minutes Intravenous On call to O.R. 10/01/16 5176 10/01/16 1227       Patient was given sequential compression devices, early ambulation, and chemoprophylaxis to prevent DVT.  Post operative care was complication by severe nausea and vomiting and early illeus.  Symptoms resolved with decadron, iv fluid, phenergan.  Patient benefited maximally from hospital stay and there were no complications.    Recent vital signs:  Patient Vitals for the past 24 hrs:  BP Temp Temp src Pulse Resp SpO2  10/03/16 0519 116/70 97.5 F (36.4 C) Oral 92 18 97 %  10/02/16 2018 - - - - - 94 %  10/02/16 2015 (!) 100/57 98 F (36.7 C) Oral 97 18 93 %  10/02/16 1839 - 98.3 F (36.8 C) Oral - - -  10/02/16 1800 106/64 - Oral (!) 102 16 97 %     Recent laboratory studies:   Recent Labs  10/01/16 1129 10/02/16 0050  10/03/16 0246  WBC 9.7 13.1*  --   HGB 9.1* 9.2*  --   HCT 30.0* 29.5*  --   PLT 456* 423*  --   NA 134* 135 132*  K 5.0 4.3 4.6  CL 99* 103 105  CO2 22 18* 18*  BUN 24* 23* 19  CREATININE 1.66* 1.73* 1.39*  GLUCOSE 104* 99 137*  INR 1.03  --   --   CALCIUM 10.2 9.4 9.0     Discharge Medications:   Allergies as of 10/03/2016      Reactions   Ace Inhibitors Shortness Of Breath, Other (See Comments), Cough   WHEEZING   Amoxicillin Anaphylaxis, Rash, Other (See Comments)   Has patient had a PCN reaction causing immediate rash, facial/tongue/throat swelling, SOB or lightheadedness with hypotension: Yes Has  patient had a PCN reaction causing severe rash involving mucus membranes or skin necrosis: No Has patient had a PCN reaction that required hospitalization No Has patient had a PCN reaction occurring within the last 10 years: No If all of the above answers are "NO", then may proceed with Cephalosporin use.   Codeine Anaphylaxis, Diarrhea, Rash   Penicillins Anaphylaxis, Rash, Other (See Comments)   Has patient had a PCN reaction causing immediate rash, facial/tongue/throat swelling, SOB or lightheadedness with hypotension: Yes Has patient had a PCN reaction causing severe rash involving mucus membranes or skin necrosis: No Has patient had a PCN reaction that required hospitalization No Has patient had a PCN reaction occurring within the last 10 years: No If all of the above answers are "NO", then may proceed with Cephalosporin use.   Morphine And Related     Per pt, cannot take because "has bad effects on his kidneys"      Medication List    STOP taking these medications   promethazine-dextromethorphan 6.25-15 MG/5ML syrup Commonly known as:  PROMETHAZINE-DM     TAKE these medications   acetaminophen 500 MG tablet Commonly known as:  TYLENOL Take 2 tablets (1,000 mg total) by mouth every 6 (six) hours as needed.   ALPRAZolam 0.5 MG tablet Commonly known as:  XANAX TAKE ONE TABLET BY MOUTH TWICE DAILY AS NEEDED What changed:  See the new instructions.   calcium carbonate 1500 (600 Ca) MG Tabs tablet Commonly known as:  OSCAL Take 600 mg of elemental calcium by mouth 2 (two) times daily with a meal.   cholecalciferol 1000 units tablet Commonly known as:  VITAMIN D Take 1,000 Units by mouth at bedtime.   FLUoxetine 40 MG capsule Commonly known as:  PROZAC Take 40 mg by mouth daily.   Fluticasone-Salmeterol 100-50 MCG/DOSE Aepb Commonly known as:  ADVAIR Inhale 1 puff into the lungs 2 (two) times daily.   levalbuterol 1.25 MG/3ML nebulizer solution Commonly known as:   XOPENEX Take 1.25 mg by nebulization every 4 (four) hours as needed for wheezing.   loratadine 10 MG tablet Commonly known as:  CLARITIN Take 1 tablet (10 mg total) by mouth daily.   mometasone 0.1 % cream Commonly known as:  ELOCON Apply 1 application topically 2 (two) times daily as needed (for itching).   oxyCODONE 5 MG immediate release tablet Commonly known as:  Oxy IR/ROXICODONE Take 1-2 tablets (5-10 mg total) by mouth every 6 (six) hours as needed for severe pain or breakthrough pain.   PROAIR HFA 108 (90 Base) MCG/ACT inhaler Generic drug:  albuterol INHALE 1 OR 2 PUFFS AS NEEDED   prochlorperazine 10 MG tablet Commonly known  as:  COMPAZINE TAKE 1 TABLET EVERY 6 HOURS AS NEEDED FOR NAUSEA AND VOMITING   senna-docusate 8.6-50 MG tablet Commonly known as:  Senokot-S Take 1 tablet by mouth 2 (two) times daily.   silver sulfADIAZINE 1 % cream Commonly known as:  SILVADENE Apply topically 2 (two) times daily.   tamsulosin 0.4 MG Caps capsule Commonly known as:  FLOMAX Take 1 capsule (0.4 mg total) by mouth daily. Start taking on:  10/04/2016   temazepam 15 MG capsule Commonly known as:  RESTORIL Take 15 mg by mouth at bedtime as needed for sleep.   traMADol 50 MG tablet Commonly known as:  ULTRAM Take 2 tablets (100 mg total) by mouth every 6 (six) hours as needed.       Diagnostic Studies: Ct Abdomen Pelvis Wo Contrast  Result Date: 10/02/2016 CLINICAL DATA:  Renal cell cancer status post left nephrectomy, with lung and brain metastases, for ORIF of pathologic right humeral fracture EXAM: CT ABDOMEN AND PELVIS WITHOUT CONTRAST TECHNIQUE: Multidetector CT imaging of the abdomen and pelvis was performed following the standard protocol without IV contrast. COMPARISON:  CT chest dated 04/23/2016. CT abdomen/ pelvis dated 03/08/2016. FINDINGS: Lower chest: Small left pleural effusion. Underlying left lower lobe is obscured. Trace right pleural effusion. 4 mm subpleural  right lower lobe nodule (series 4/ image 9). Hepatobiliary: Unenhanced liver is unremarkable. Gallbladder is unremarkable. No intrahepatic or extrahepatic ductal dilatation. Pancreas: 3.9 cm mass along the pancreatic tail (series 3/image 30), previously 2.4 cm. Spleen: Within normal limits. Adrenals/Urinary Tract: 3.4 cm right adrenal mass (series 3/ image 23), previously 2.7 cm. 2.8 cm left adrenal mass (series 3/ image 27), previously 1.4 cm. Status post left nephrectomy. Mild cortical lobulation/soft tissue prominence along the lateral right lower kidney (series 3/ image 39), poorly evaluated on unenhanced CT, but more pronounced than on the prior study. Underlying lesion is not excluded. No renal calculi or hydronephrosis. Bladder is within normal limits. Stomach/Bowel: Stomach is within normal limits. No evidence of bowel obstruction. Normal appendix (series 3/ image 42). Mild left colonic diverticulosis, without evidence of diverticulitis. Vascular/Lymphatic: No evidence of abdominal aortic aneurysm. Atherosclerotic calcifications of the abdominal aorta and branch vessels. 1.7 cm short axis peripancreatic node (series 3/ image 32). 1.8 cm short axis left para-aortic node (series 3/ image 35), previously 1.3 cm. 12 mm short axis node inferior to the aortic bifurcation (series 3/ image 58), new. Reproductive: Prostate is grossly unremarkable. Other: No abdominopelvic ascites. Musculoskeletal: Multifocal lytic metastases, predominantly involving the bilateral pelvis. Dominant lesions include: --1.6 cm lesion in the right posterior iliac bone (series 3/image 23) --2.1 cm lesion in the right acetabulum (series 3/image 32) --1.4 cm lesion the left humeral head (series 3/image 79) --1.5 cm lesion in the right parasymphyseal region (series 3/ image 83) IMPRESSION: Status post left nephrectomy. 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 Mild abdominopelvic lymphadenopathy, new/increased. Multifocal  lytic osseous metastases, predominantly involving the bilateral pelvis, as described above. Small left pleural effusion. Underlying left lower lobe is obscured. Trace right pleural effusion. Mild cortical lobulation of the lateral right lower kidney, poorly evaluated/indeterminate. Electronically Signed   By: Julian Hy M.D.   On: 10/02/2016 16:49   Dg Shoulder Right  Result Date: 09/12/2016 CLINICAL DATA:  Status post fall, with right arm pain and deformity. Initial encounter. EXAM: RIGHT SHOULDER - 2+ VIEW COMPARISON:  CT of the chest performed 04/23/2016 FINDINGS: There is a displaced fracture through the midshaft of the right humerus. The appearance  raises suspicion for an underlying moth-eaten lytic lesion, raising suspicion for underlying malignancy. MRI of the right humerus would be helpful for further evaluation. Nearly 1 shaft medial and anterior width displacement is noted, with surrounding soft tissue swelling. The right humeral head remains seated at the glenoid fossa. The elbow joint is grossly unremarkable. IMPRESSION: Displaced fracture through the midshaft of the right humerus. Suspect underlying moth-eaten lytic lesion, concerning for underlying malignancy. MRI of the right humerus would be helpful for further evaluation. Electronically Signed   By: Garald Balding M.D.   On: 09/12/2016 19:29   Dg Elbow Complete Right  Result Date: 09/12/2016 CLINICAL DATA:  Status post fall, with right elbow pain. Initial encounter. EXAM: RIGHT ELBOW - COMPLETE 3+ VIEW COMPARISON:  None. FINDINGS: There is a displaced fracture through the midshaft of the right humerus. The appearance is concerning for an underlying moth-eaten lytic lesion, raising suspicion for underlying malignancy. MRI of the right humerus would be helpful for further evaluation. Nearly 1 shaft width displacement is noted, with surrounding soft tissue swelling. The elbow joint is grossly unremarkable. IMPRESSION: Displaced fracture  through the midshaft of the right humerus. Suspect underlying moth-eaten lytic lesion, raising concern for underlying malignancy. MRI of the right humerus would be helpful for further evaluation. Electronically Signed   By: Garald Balding M.D.   On: 09/12/2016 19:27   Dg Chest Port 1 View  Result Date: 10/01/2016 CLINICAL DATA:  Tachypnea. Status post right shoulder surgery today. EXAM: PORTABLE CHEST 1 VIEW COMPARISON:  04/16/2016 and CT of 04/23/2016 FINDINGS: Right proximal humerus fixation. Lower cervical spine fixation. Midline trachea. Mild cardiomegaly. Soft tissue fullness involves the mediastinum and left hilum. Probable small layering left pleural effusion. No pneumothorax. Worsened left lower lobe aeration. IMPRESSION: Combination of findings, including worsened left lower lobe aeration, probable layering left pleural effusion, and mediastinal/left hilar soft tissue fullness. Findings likely represent disease progression when compared to the 04/23/2016 CT. Repeat CT may be informative. Electronically Signed   By: Abigail Miyamoto M.D.   On: 10/01/2016 19:51   Dg Abd Portable 1v  Result Date: 10/02/2016 CLINICAL DATA:  Nausea and vomiting for 3 days EXAM: PORTABLE ABDOMEN - 1 VIEW COMPARISON:  CT abdomen and pelvis March 08, 2016 FINDINGS: There is rather marked distention of the stomach with air. There are loops of borderline dilated small bowel. There is moderate stool in the colon. No air-fluid levels. No free air appreciable. There was extensive splenic artery calcification as well as aortic and bilateral iliac atherosclerotic calcification. IMPRESSION: Question degree of ileus or enteritis. Findings do not appear be indicative of bowel obstruction. Stomach is rather markedly distended with air. No free air evident. There is aortoiliac atherosclerosis as well as splenic artery atherosclerotic calcification. Electronically Signed   By: Lowella Grip III M.D.   On: 10/02/2016 11:04   Dg  Humerus Right  Result Date: 10/01/2016 CLINICAL DATA:  Intramedullary nail placement in the right humeral fracture. EXAM: RIGHT HUMERUS - 2+ VIEW COMPARISON:  09/12/2016 FINDINGS: Intramedullary nail has been placed in the right humerus. There are 3 proximal interlocking screws. Three distal interlocking screws. There is a fracture involving the mid humerus with adjacent callus formation. Small fracture fragments are present. There is anatomic alignment of the humerus. IMPRESSION: Internal fixation of the mid right humeral fracture. Electronically Signed   By: Markus Daft M.D.   On: 10/01/2016 17:01   Dg Humerus Right  Result Date: 10/01/2016 CLINICAL DATA:  Hardware fixation of  a probable pathological right humerus fracture. EXAM: RIGHT HUMERUS - 2+ VIEW; DG C-ARM 61-120 MIN COMPARISON:  Radiographs dated 09/12/2016. FINDINGS: 7 C-arm views of the right humerus demonstrate interval intramedullary rod and screw fixation of the previously demonstrated probable pathological fracture of the mid right humerus. Again demonstrated is a lytic lesion at the fracture site with interval partial bridging callus formation or mass. Essentially anatomic position and alignment of the main fracture fragments. IMPRESSION: Hardware fixation of the previously demonstrated probable pathological fracture of the mid right humerus with anatomic position and alignment. Electronically Signed   By: Claudie Revering M.D.   On: 10/01/2016 14:41   Dg Humerus Right  Result Date: 09/12/2016 CLINICAL DATA:  Status post fall, with right arm pain. Initial encounter. EXAM: RIGHT HUMERUS - 2+ VIEW COMPARISON:  None. FINDINGS: There is a displaced fracture through the midshaft of the right humerus. The appearance raises suspicion for an underlying moth-eaten lytic lesion, raising suspicion for underlying malignancy. MRI of the right humerus would be helpful for further evaluation. Nearly 1 shaft width medial and anterior displacement is noted, with  surrounding soft tissue swelling. The right humeral head remains seated at the glenoid fossa. Mild degenerative change is noted about the acromion. The elbow joint is grossly unremarkable. IMPRESSION: Displaced fracture through the midshaft of the right humerus. There is question of an underlying moth-eaten lytic lesion, raising suspicion for underlying malignancy. MRI of the right humerus would be helpful for further evaluation, when and as deemed clinically appropriate. Electronically Signed   By: Garald Balding M.D.   On: 09/12/2016 19:26   Dg C-arm 1-60 Min  Result Date: 10/01/2016 CLINICAL DATA:  Hardware fixation of a probable pathological right humerus fracture. EXAM: RIGHT HUMERUS - 2+ VIEW; DG C-ARM 61-120 MIN COMPARISON:  Radiographs dated 09/12/2016. FINDINGS: 7 C-arm views of the right humerus demonstrate interval intramedullary rod and screw fixation of the previously demonstrated probable pathological fracture of the mid right humerus. Again demonstrated is a lytic lesion at the fracture site with interval partial bridging callus formation or mass. Essentially anatomic position and alignment of the main fracture fragments. IMPRESSION: Hardware fixation of the previously demonstrated probable pathological fracture of the mid right humerus with anatomic position and alignment. Electronically Signed   By: Claudie Revering M.D.   On: 10/01/2016 14:41    Disposition: 01-Home or Self Care   Patient had revoked his hospice benefit prior to the hospitalization.  Following the palliative care surgical stabilization of the pathologic humerus fracture for pain control, this patient and his wife have requested readmission to Hospice care.  I have put in a STAT consult in to case management for Hospice readmission.  He can be discharged to home once this has been arranged     Follow-up Information    Altamese Waterloo, MD Follow up.   Specialty:  Orthopedic Surgery Contact information: [place] [address] [phone]            Signed: Linda Hedges 10/03/2016, 10:15 AM

## 2016-10-03 NOTE — Progress Notes (Addendum)
Pt ready for d/c home per MD. Pt tolerated solid lunch well with no nausea or vomiting today. His hospice services were resumed per [facility] with [facility]. Discharge instructions and prescriptions reviewed with pt, all questions answered. Pt assisted to car via RN and NT. Discharge summary faxed to [facility] and RN called them to let them know pt was being discharged per Margaretmary Eddy' note.  Brogan, Jerry Caras

## 2016-10-04 ENCOUNTER — Encounter (HOSPITAL_COMMUNITY): Payer: Self-pay | Admitting: Orthopedic Surgery

## 2016-10-04 LAB — GLUCOSE, CAPILLARY: Glucose-Capillary: 111 mg/dL — ABNORMAL HIGH (ref 65–99)

## 2016-10-04 NOTE — Op Note (Signed)
NAME:  [name], [name] NO.:  [id number]  MEDICAL RECORD NO.:  [id number]  LOCATION:                                 FACILITY:  PHYSICIAN:  Astrid Divine. Marcelino Scot, M.D.      DATE OF BIRTH:  DATE OF PROCEDURE:  10/01/2016 DATE OF DISCHARGE:                              OPERATIVE REPORT   PREOPERATIVE DIAGNOSIS:  Pathological right humeral shaft fracture.  POSTOPERATIVE DIAGNOSIS:  Pathological right humeral shaft fracture.  PROCEDURE:  Intramedullary nailing of the right humerus using a Tornier Aequalis nail, 8 x 208 mm x 270 mm statically locked.  SURGEON:  Astrid Divine. Marcelino Scot, M.D.  ASSISTANT:  Ainsley Spinner, PA-C.  ANESTHESIA:  General.  I/O:  Crystalloid 800 mL, 1 unit PRBC, colloid 250 mL/bleeding less than 50 mL.  COMPLICATIONS:  None.  TOURNIQUET:  None.  DISPOSITION:  To PACU.  CONDITION:  Stable.  BRIEF SUMMARY AND INDICATION FOR PROCEDURE:  [name] is a very pleasant 71 year old right-hand dominant male with renal cell carcinoma who sustained an acute fracture of his right humerus after a fall off his walker.  He denied numbness or tingling in the extremity, had severe pain, inability to control that in a sling and splint or with a Sarmiento brace.  He was seen and evaluated in the office where surgical fixation was recommended.  Because of the pathologic nature of this fracture and the extreme vascularity associated with renal cell metastasis, we recommended intramedullary nailing.  Risks discussed included axillary nerve injury, radial nerve injury, other nerve and vessel injury, DVT, PE, heart attack, stroke, death, and loss of motion as well as the probability of nonunion.  After full discussion, both the patient and his wife did wish to proceed.  BRIEF SUMMARY OF PROCEDURE:  The patient was taken to the operating room where general anesthesia was induced.  He was positioned supine with a bump under the right hip and chest.  A standard  chlorhexidine wash was performed.  It should be noted that he had extensive rash and wound and skin irritation of his upper arm where he had a cotton stockinette applied underneath the Sarmiento brace.  Much of this skin sloughed. Fortunately, it did not appear to be full-thickness in any areas.  The chlorhexidine wash did remove much of this.  A standard Betadine scrub and paint ensued.  After the prep and drape and time-out, a 2-cm incision was made just anterior to the acromion.  The starting awl was checked on AP and lateral images to assure appropriate starting position.  It was then advanced into the proximal humerus, and the guidewire inserted through the awl and across the fracture site into the distal humerus.  Because of the nature of the fracture, no reaming was performed.  The 8 x 270 nail which was the largest available was then inserted and checked for position and depth on orthogonal views.  The proximal locks were then placed using one in the greater tuberosity, one in the calcar, and one in the lesser tuberosity.  All screws were within the nail.  Soft tissues were spread to reduce the risk of injury to the axillary  nerve or biceps tendon proximally.  Distally, the jig was used to place 2 anterior to posterior screws and then 1 slightly lateral oblique to medial posterior.  All screws were checked for position and found to be appropriate in both trajectory and length.  Wounds were irrigated thoroughly and closed in standard layered fashion.  Ainsley Spinner, PA-C, assisted me throughout and controlled the arm for reduction and instrumentation and also assisted with wound closure.  His assistance was necessary given intramedullary nailing.  PROGNOSIS:  [name] is at elevated risk for multiple complications. Anesthesia felt that he would benefit from transfusion, and consequently, he did receive 1 unit at the start of the case.  He remained hemodynamically stable throughout.   He has had increased risk for wound complications and infection given his reaction to the stockinette, but with instrumentation, this risk should be reduced as we were able to place all the screws outside the zone proximally and with minimal interference distally, and we can go without a dressing.  We anticipate an overnight stay with discharge in the morning if he remains stable.  We will plan to see him back in the office for removal of his sutures in 2 weeks.     Astrid Divine. Marcelino Scot, M.D.   ______________________________ Astrid Divine. Marcelino Scot, M.D.    MHH/MEDQ  D:  10/01/2016  T:  10/01/2016  Job:  [id number]

## 2016-10-05 LAB — TYPE AND SCREEN
ABO/RH(D): O NEG
Antibody Screen: NEGATIVE
UNIT DIVISION: 0
Unit division: 0

## 2016-10-05 LAB — BPAM RBC
BLOOD PRODUCT EXPIRATION DATE: 201806302359
Blood Product Expiration Date: 201806302359
ISSUE DATE / TIME: 201806081303
ISSUE DATE / TIME: 201806081303
UNIT TYPE AND RH: 9500
Unit Type and Rh: 9500

## 2016-10-07 NOTE — Telephone Encounter (Signed)
ok 

## 2016-10-10 NOTE — Progress Notes (Signed)
[facility] Consultation Progress Note  Patient: [name] CXK:[id number]   PCP: [name]., MD DOB: [birth year]   DOA: 10/01/2016   DOS: 10/10/2016   Date of Service: the patient was seen and examined on 10/10/2016 Primary service: Dr handy   Subjective: symptoms totally resolved, no nausea or vomiting, pain is also well controlled.   Brief hospital course: Pt. with PMH of Renal cell cancer, lung cancer, metastatic brain mass, on hospice, COPD, CAD, CKD stage III, left nephrectomy, type II DM, GERD, anxiety; admitted on 10/01/2016, with complaint of fracture of the humerus, and has undergone palliative nail placement in the humeral shaft. Patient is not on hospice  Assessment and Plan: 1. Pathologic fracture of right humerus S/P intramedullary nail placement 10/01/2016. Pain is still moderately controlled. Patient is actually not taking any of his pain medication. Patient is prescribed tramado Family refusing oxycodone to be used on patient. Will switch back to tramadol Further management per orthopedics.  2. Severe nausea with episode of vomiting. Likely nociceptive pain. Bowel sounds are present no abdominal tenderness on examination.  x-ray of the abdomen, shows ileus.  Ct abdomen was done which ruled out any ileus or SBO.  Symptoms also resolved. Continue when necessary Zofran. Please scheduled MiraLAX.  3. Urinary retention. Likely postoperative. Control pain, control constipation. Added Flomax  4. Insomnia. Likely associated with pain. Scheduled Ambien tonight.  5. Renal cell carcinoma with brain metastasis. Lung cancer. Not on hospice.  Pt has worsening of his cancer on ct abdomen, recommendation is to folllow up with PCP for goals of care.   6. Anxiety and mood disorder. Continue Xanax continue Prozac.  7. Type 2 diabetes mellitus. Last A1c was in September 2017 it was 6.1.  8. COPD. No evidence of acute flareup at present. Continue when  necessary nebulizer as well as scheduled inhaler.  9. GERD. Added Pepcid. When necessary Mylanta.  Diet: soft diet DVT Prophylaxis: subcutaneous Heparin  Family Communication: no family was present at bedside, at the time of interview.   Disposition: We will sign off, if pt tolerates soft diet, he can be safely discharge back home.   Recommendation on discharge: Continue follow-up with PCP/hospice.  Other Consultants: none Procedures: Intramedullary nail placement Antibiotics: Anti-infectives    Start     Dose/Rate Route Frequency Ordered Stop   10/01/16 1900  clindamycin (CLEOCIN) IVPB 600 mg     600 mg 100 mL/hr over 30 Minutes Intravenous Every 6 hours 10/01/16 1849 10/02/16 0715   10/01/16 0900  clindamycin (CLEOCIN) IVPB 900 mg     900 mg 100 mL/hr over 30 Minutes Intravenous On call to O.R. 10/01/16 0858 10/01/16 1227      Objective: Physical Exam: Vitals:   10/02/16 1839 10/02/16 2015 10/02/16 2018 10/03/16 0519  BP:  (!) 100/57  116/70  Pulse:  97  92  Resp:  18  18  Temp: 98.3 F (36.8 C) 98 F (36.7 C)  97.5 F (36.4 C)  TempSrc: Oral Oral  Oral  SpO2:  93% 94% 97%  Weight:      Height:       No intake or output data in the 24 hours ending 10/10/16 1705 Filed Weights   10/01/16 1132  Weight: 81.6 kg (180 lb)   General: Alert, Awake and Oriented to Time, Place and Person. Appear in no distress, affect appropriate Eyes: PERRL, Conjunctiva normal ENT: Oral Mucosa clear moist. Neck: no JVD, no Abnormal Mass Or lumps Cardiovascular:  S1 and S2 Present, no Murmur, Peripheral Pulses Present Respiratory: normal respiratory effort, Bilateral Air entry equal and Decreased, no use of accessory muscle, Clear to Auscultation, no Crackles, no wheezes Abdomen: Bowel Sound present, Soft and no tenderness, no hernia Skin: no redness, no Rash, on induration Extremities: no Pedal edema, no calf tenderness Neurologic: Grossly no focal neuro deficit. Bilaterally Equal  motor strength  Data Reviewed: CBC: No results for input(s): WBC, NEUTROABS, HGB, HCT, MCV, PLT in the last 168 hours. Basic Metabolic Panel: No results for input(s): NA, K, CL, CO2, GLUCOSE, BUN, CREATININE, CALCIUM, MG, PHOS in the last 168 hours. Liver Function Tests: No results for input(s): AST, ALT, ALKPHOS, BILITOT, PROT, ALBUMIN in the last 168 hours. No results for input(s): LIPASE, AMYLASE in the last 168 hours. No results for input(s): AMMONIA in the last 168 hours. Cardiac Enzymes: No results for input(s): CKTOTAL, CKMB, CKMBINDEX, TROPONINI in the last 168 hours. BNP (last 3 results) No results for input(s): BNP in the last 8760 hours. CBG: No results for input(s): GLUCAP in the last 168 hours. Recent Results (from the past 240 hour(s))  Urine culture     Status: None   Collection Time: 10/01/16 10:25 PM  Result Value Ref Range Status   Specimen Description URINE, CLEAN CATCH  Final   Special Requests NONE  Final   Culture NO GROWTH  Final   Report Status 10/03/2016 FINAL  Final    Studies: No results found.   Scheduled Meds: . levalbuterol  1.25 mg Nebulization Once   Continuous Infusions:  PRN Meds:   Time spent: 35 minutes  Author: Berle Mull, MD [facility] Pager: [phone] 10/03/2016 5:05 PM  If 7PM-7AM, please contact night-coverage at [email or web address], password [facility]

## 2016-10-13 ENCOUNTER — Telehealth: Payer: Self-pay | Admitting: Family Medicine

## 2016-10-13 NOTE — Telephone Encounter (Signed)
Please call pt and tell him to just take the tramadol once a day.  Pt is having high blood pressure and wife thinks it is because of the tramadol.  This is per wives request.  His hospice nurse also suggest.  Pt says he is not having pain but wants to take to rx as prescribed tid.   Pt cell # [phone]  Thank sTeri

## 2016-11-03 ENCOUNTER — Other Ambulatory Visit: Payer: Self-pay | Admitting: Family Medicine

## 2016-11-03 MED ORDER — ALPRAZOLAM 0.5 MG PO TABS: 0.5000 mg | ORAL_TABLET | Freq: Two times a day (BID) | ORAL | 5 refills | Status: DC | PRN

## 2016-11-03 NOTE — Telephone Encounter (Signed)
[name] with Hospice contacted office for refill request on the following medications:  ALPRAZolam (XANAX) 0.5 MG tablet.  [facility].  CB#[phone]/MW

## 2016-11-30 ENCOUNTER — Telehealth: Payer: Self-pay | Admitting: Family Medicine

## 2016-11-30 NOTE — Telephone Encounter (Signed)
lmtcb for [name] to call back. Need to know where to fax orders to? Do we need to fax order for Haldol or just tell Wendy?-aa

## 2016-11-30 NOTE — Telephone Encounter (Signed)
Wendy with Hospice called wanting an order for oxygen.   actually His oxygen is 98% but he is sob.  Also an order for haldol for insomnia and restlessness.  They would like an order asap because the family cant rest because he cant rest.  They understand that DR. Rosanna Randy is out. The nurse said they could get a hospice doctor to handle it if we cant.  Wendy's call back is [phone]  Thanks, Con Memos

## 2016-11-30 NOTE — Telephone Encounter (Signed)
Please review. Can we do this for the patient or ask the Hospice doctor to handle until Dr Rosanna Randy is back next week?-aa

## 2016-11-30 NOTE — Telephone Encounter (Signed)
OK for haloperidal 1mg  qhs for insomnia, #30, rf x 1

## 2016-12-01 NOTE — Telephone Encounter (Signed)
Spoke with [name] and she said they ended up contacting Hospice doctor since it was getting late and got the orders. They will send over updated medication list on the [name]

## 2016-12-20 ENCOUNTER — Telehealth: Payer: Self-pay | Admitting: Family Medicine

## 2016-12-20 NOTE — Telephone Encounter (Signed)
Please review-aa 

## 2016-12-20 NOTE — Telephone Encounter (Signed)
Pt's wife called requesting to speak with Dr Rosanna Randy.She states her husband recently passed away.She would not give any further information.I advised that it would probably be a nurse to return call

## 2016-12-25 DEATH — deceased

## 2017-02-04 ENCOUNTER — Other Ambulatory Visit: Payer: Self-pay | Admitting: Nurse Practitioner

## 2017-03-15 IMAGING — CT CT CHEST W/O CM
2 of 4 series · 15 of 36 positions shown, 18 images · non-contrast
Comparison: 09/22/2015

CLINICAL DATA: Renal cell carcinoma with metastasis to lung and
brain diagnosed in 9575 and 5551. Left nephrectomy.

EXAM:
CT CHEST WITHOUT CONTRAST
TECHNIQUE: Multidetector CT imaging of the chest was performed following the
standard protocol without IV contrast.

[Series 2: chest w/o st · axial · non-contrast · 0.84mm/px · z∈[-341,-29]mm · 12 of 182 slices shown, 15 images]
[im 13/182  mediastinal]
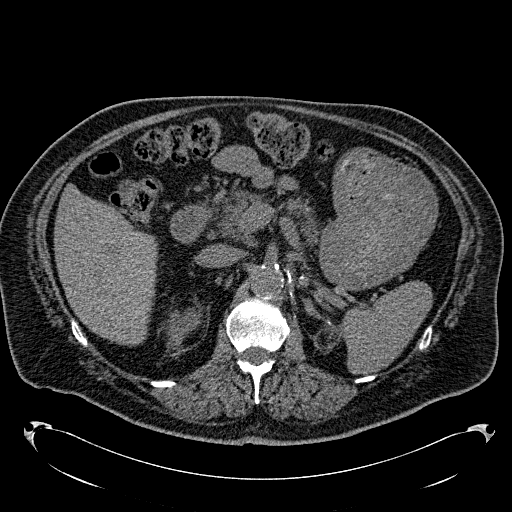
[im 13/182  lung]
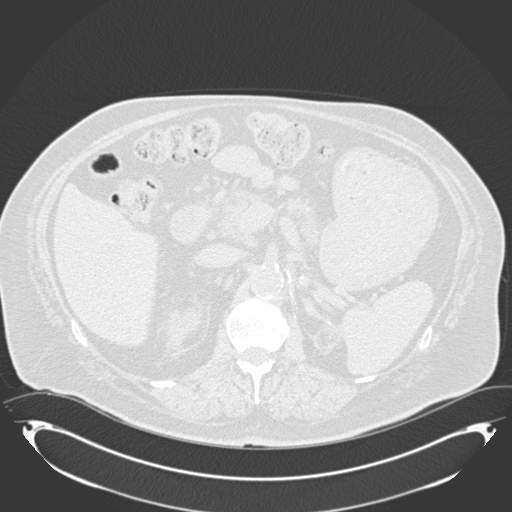
[im 26/182  lung]
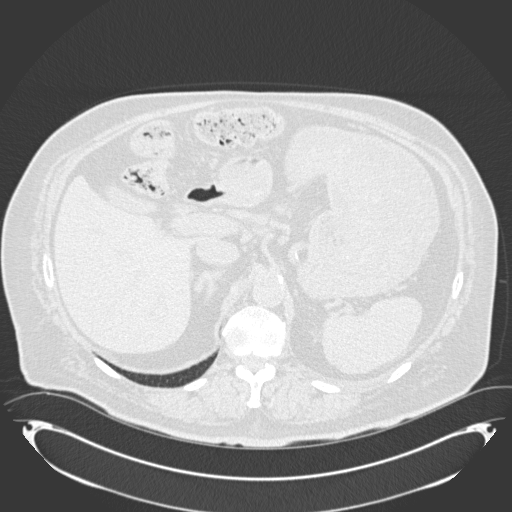
[im 39/182  lung]
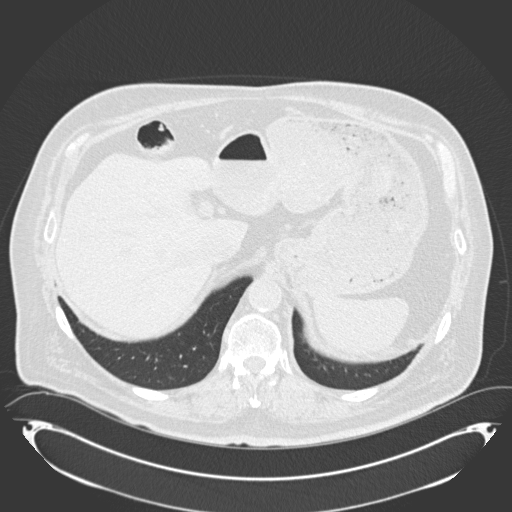
[im 52/182  lung]
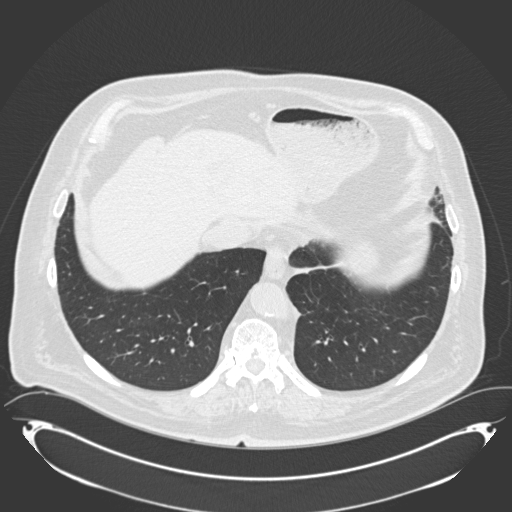
[im 65/182  mediastinal]
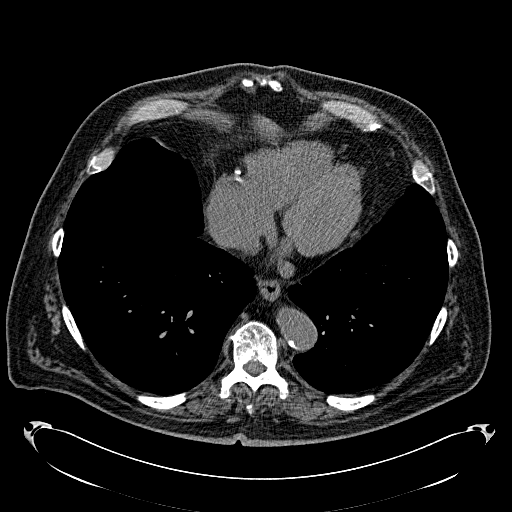
[im 65/182  lung]
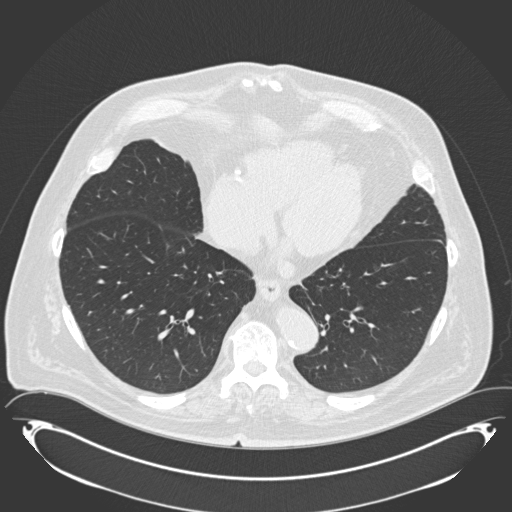
[im 78/182  lung]
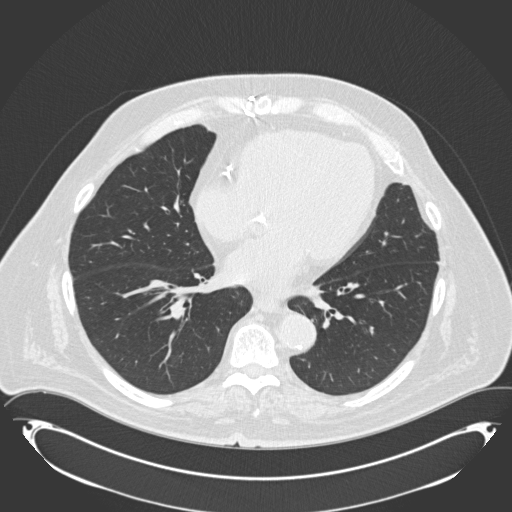
[im 104/182  lung]
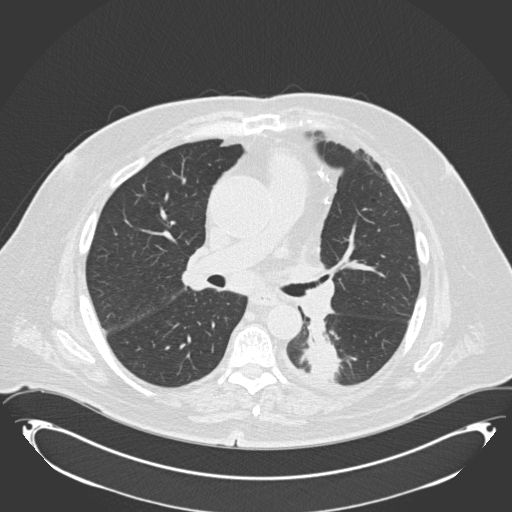
[im 117/182  lung]
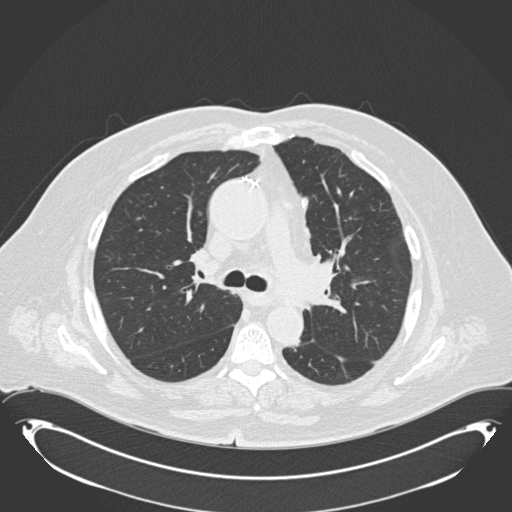
[im 130/182  mediastinal]
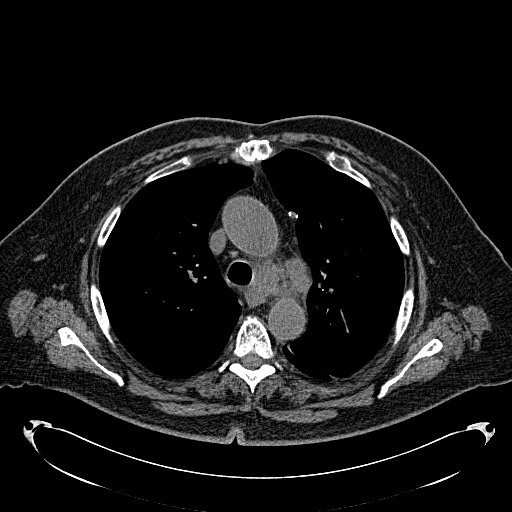
[im 130/182  lung]
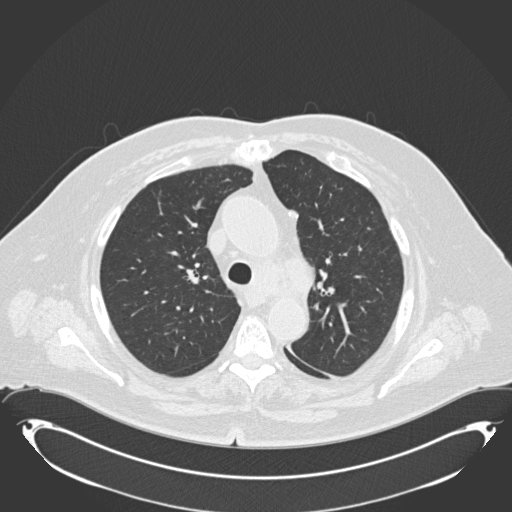
[im 143/182  lung]
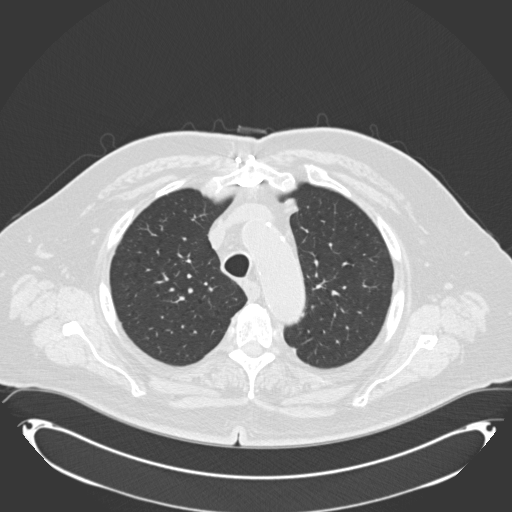
[im 156/182  lung]
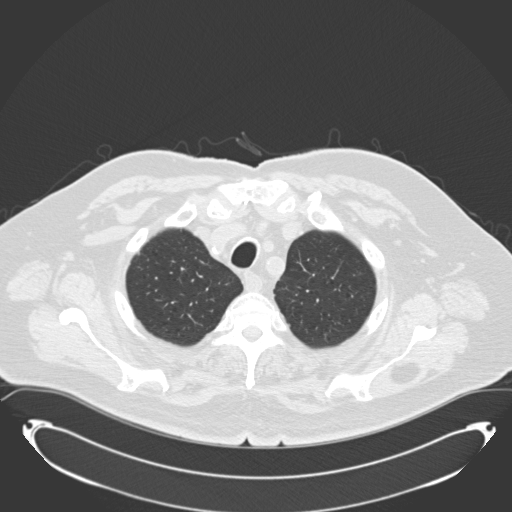
[im 169/182  lung]
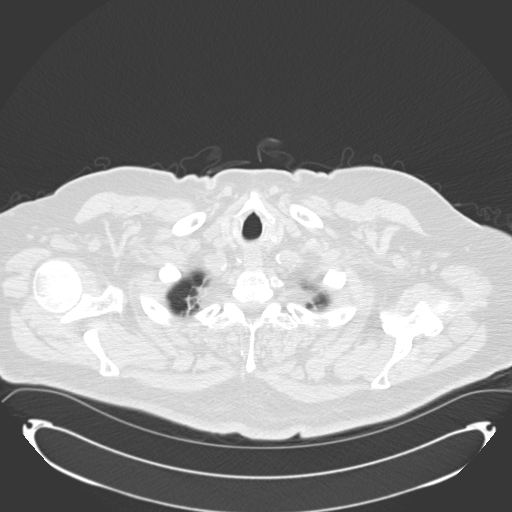

[Series 602: coronal images · coronal · 0.84mm/px · 3 of 150 slices shown]
[im 30/150  lung]
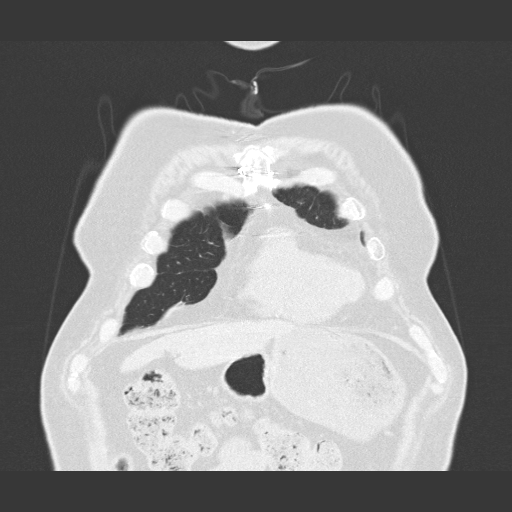
[im 60/150  lung]
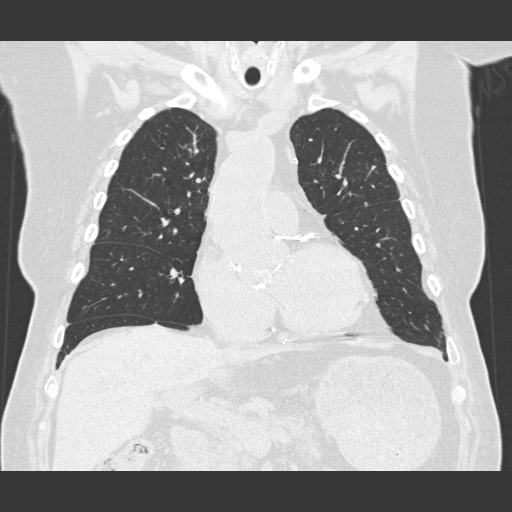
[im 90/150  lung]
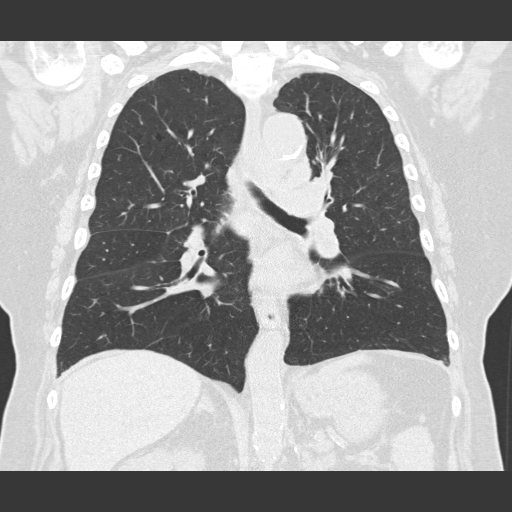

[15 of 36 positions shown; findings below may reference images not displayed]

FINDINGS: Cardiovascular: ascending aortic dilatation, including at 5.1 cm on
image 80/series 2. This is similar. No surrounding hemorrhage.
Aortic and branch vessel atherosclerosis. Tortuous descending
thoracic aorta. Prior median sternotomy. Mild cardiomegaly. No
pericardial effusion.

Mediastinum/Nodes: No supraclavicular adenopathy. Infiltrative mass/
adenopathy in the AP window region. This measures on the order of
5.8 x 3.6 cm on image 63/ series 2. Compare 5.5 x 3.4 cm on the
prior exam. Slight increase in extension more superiorly in the AP
window, including on image 53/ series 2 today.

Subcarinal adenopathy at 1.9 cm on image 77/series 2. Compare 1.8 cm
on the prior exam (when remeasured).

Soft tissue fullness about the left infrahilar region is
suboptimally evaluated without IV contrast on image 84/series 2.
Felt to be similar.

Lungs/Pleura: Left-sided pleural thickening or fluid is slightly
increased. Mild centrilobular emphysema.

Thickening or nodularity along the right major fissure is felt to be
similar, including at 4 mm on image 71/series 5.

Left apical mild nodularity is not significantly changed.

A left upper lobe 5 mm nodule on image 54/series 5 is similar. More
inferior left upper lobe 7 mm nodule on image 73/series 5 is
unchanged (when remeasured).

Spiculated superior segment left lower lobe lung mass measures on
the order of 4.8 x 2.9 cm on image 80/series 5. Compare 3.9 x 2.2 cm
on the prior exam (when remeasured).

Upper Abdomen: Normal imaged portions of the liver, stomach,
pancreas, right adrenal gland, and right kidney. Calcification along
the splenic capsule may relate to prior trauma. Left nephrectomy.

Musculoskeletal: Lower cervical spine fixation. A right-sided T7
sclerotic lesion is likely a bone island.
IMPRESSION: 1. Mild progression of thoracic adenopathy, with a dominant
infiltrative mass in the AP window.
2. Enlargement of a superior segment left lower lobe lung mass.
Smaller pulmonary nodules are similar.
3. Increasing trace left pleural fluid or thickening.
